# Patient Record
Sex: Female | Born: 1935 | Hispanic: No | Marital: Single | Smoking: Never smoker
Health system: Southern US, Community
[De-identification: ages and names within clinical notes are randomized; demographics above are authoritative.]

## PROBLEM LIST (undated history)

## (undated) DIAGNOSIS — I10 Essential (primary) hypertension: Secondary | ICD-10-CM

## (undated) DIAGNOSIS — E7800 Pure hypercholesterolemia, unspecified: Secondary | ICD-10-CM

## (undated) DIAGNOSIS — K449 Diaphragmatic hernia without obstruction or gangrene: Secondary | ICD-10-CM

## (undated) DIAGNOSIS — K219 Gastro-esophageal reflux disease without esophagitis: Secondary | ICD-10-CM

## (undated) DIAGNOSIS — M8580 Other specified disorders of bone density and structure, unspecified site: Secondary | ICD-10-CM

## (undated) DIAGNOSIS — C50919 Malignant neoplasm of unspecified site of unspecified female breast: Secondary | ICD-10-CM

## (undated) DIAGNOSIS — I700 Atherosclerosis of aorta: Secondary | ICD-10-CM

## (undated) DIAGNOSIS — K5790 Diverticulosis of intestine, part unspecified, without perforation or abscess without bleeding: Secondary | ICD-10-CM

## (undated) HISTORY — DX: Pure hypercholesterolemia, unspecified: E78.00

## (undated) HISTORY — DX: Gastro-esophageal reflux disease without esophagitis: K21.9

## (undated) HISTORY — DX: Atherosclerosis of aorta: I70.0

## (undated) HISTORY — DX: Malignant neoplasm of unspecified site of unspecified female breast: C50.919

## (undated) HISTORY — PX: TONSILLECTOMY: SUR1361

## (undated) HISTORY — DX: Essential (primary) hypertension: I10

## (undated) HISTORY — DX: Diverticulosis of intestine, part unspecified, without perforation or abscess without bleeding: K57.90

## (undated) HISTORY — DX: Other specified disorders of bone density and structure, unspecified site: M85.80

## (undated) HISTORY — PX: ABDOMINAL HYSTERECTOMY: SHX81

## (undated) HISTORY — PX: CATARACT EXTRACTION: SUR2

## (undated) HISTORY — DX: Diaphragmatic hernia without obstruction or gangrene: K44.9

---

## 1984-05-29 DIAGNOSIS — C50919 Malignant neoplasm of unspecified site of unspecified female breast: Secondary | ICD-10-CM

## 1984-05-29 HISTORY — DX: Malignant neoplasm of unspecified site of unspecified female breast: C50.919

## 1997-05-29 HISTORY — PX: MASTECTOMY: SHX3

## 1997-12-14 ENCOUNTER — Other Ambulatory Visit: Admission: RE | Admit: 1997-12-14 | Discharge: 1997-12-14 | Payer: Self-pay | Admitting: Cardiology

## 1998-12-20 ENCOUNTER — Other Ambulatory Visit: Admission: RE | Admit: 1998-12-20 | Discharge: 1998-12-20 | Payer: Self-pay | Admitting: Cardiology

## 1999-07-07 ENCOUNTER — Ambulatory Visit (HOSPITAL_COMMUNITY): Admission: RE | Admit: 1999-07-07 | Discharge: 1999-07-07 | Payer: Self-pay | Admitting: Obstetrics & Gynecology

## 2000-01-02 ENCOUNTER — Other Ambulatory Visit: Admission: RE | Admit: 2000-01-02 | Discharge: 2000-01-02 | Payer: Self-pay | Admitting: Cardiology

## 2001-01-04 ENCOUNTER — Other Ambulatory Visit: Admission: RE | Admit: 2001-01-04 | Discharge: 2001-01-04 | Payer: Self-pay | Admitting: Cardiology

## 2002-07-28 ENCOUNTER — Other Ambulatory Visit: Admission: RE | Admit: 2002-07-28 | Discharge: 2002-07-28 | Payer: Self-pay | Admitting: Obstetrics & Gynecology

## 2003-09-29 ENCOUNTER — Other Ambulatory Visit: Admission: RE | Admit: 2003-09-29 | Discharge: 2003-09-29 | Payer: Self-pay | Admitting: Obstetrics & Gynecology

## 2003-10-05 ENCOUNTER — Ambulatory Visit (HOSPITAL_COMMUNITY): Admission: RE | Admit: 2003-10-05 | Discharge: 2003-10-05 | Payer: Self-pay | Admitting: Obstetrics & Gynecology

## 2003-10-06 ENCOUNTER — Ambulatory Visit: Admission: RE | Admit: 2003-10-06 | Discharge: 2003-10-06 | Payer: Self-pay | Admitting: Gynecologic Oncology

## 2003-10-13 ENCOUNTER — Inpatient Hospital Stay (HOSPITAL_COMMUNITY): Admission: RE | Admit: 2003-10-13 | Discharge: 2003-10-17 | Payer: Self-pay | Admitting: Obstetrics & Gynecology

## 2003-10-13 ENCOUNTER — Encounter (INDEPENDENT_AMBULATORY_CARE_PROVIDER_SITE_OTHER): Payer: Self-pay | Admitting: Specialist

## 2006-08-08 ENCOUNTER — Ambulatory Visit: Payer: Self-pay | Admitting: Vascular Surgery

## 2009-02-26 ENCOUNTER — Encounter: Admission: RE | Admit: 2009-02-26 | Discharge: 2009-02-26 | Payer: Self-pay | Admitting: Cardiology

## 2010-01-12 ENCOUNTER — Ambulatory Visit: Payer: Self-pay | Admitting: Cardiology

## 2010-01-19 ENCOUNTER — Ambulatory Visit (HOSPITAL_COMMUNITY): Admission: RE | Admit: 2010-01-19 | Discharge: 2010-01-19 | Payer: Self-pay | Admitting: Cardiology

## 2010-01-19 ENCOUNTER — Ambulatory Visit: Payer: Self-pay | Admitting: Cardiology

## 2010-03-14 ENCOUNTER — Ambulatory Visit: Payer: Self-pay | Admitting: Cardiology

## 2010-03-15 ENCOUNTER — Ambulatory Visit: Payer: Self-pay | Admitting: Cardiology

## 2010-03-21 ENCOUNTER — Encounter: Admission: RE | Admit: 2010-03-21 | Discharge: 2010-03-21 | Payer: Self-pay | Admitting: Cardiology

## 2010-08-19 ENCOUNTER — Other Ambulatory Visit: Payer: Self-pay | Admitting: *Deleted

## 2010-08-19 DIAGNOSIS — G4700 Insomnia, unspecified: Secondary | ICD-10-CM

## 2010-08-19 NOTE — Telephone Encounter (Signed)
Received refill request via fax

## 2010-08-22 MED ORDER — ALPRAZOLAM 0.25 MG PO TABS: 0.2500 mg | ORAL_TABLET | Freq: Every evening | ORAL | Status: AC | PRN

## 2010-08-31 ENCOUNTER — Other Ambulatory Visit (INDEPENDENT_AMBULATORY_CARE_PROVIDER_SITE_OTHER): Payer: Medicare Other | Admitting: *Deleted

## 2010-08-31 DIAGNOSIS — E7800 Pure hypercholesterolemia, unspecified: Secondary | ICD-10-CM

## 2010-08-31 LAB — BASIC METABOLIC PANEL
CO2: 30 mEq/L (ref 19–32)
Chloride: 104 mEq/L (ref 96–112)
Creatinine, Ser: 0.8 mg/dL (ref 0.4–1.2)
Glucose, Bld: 102 mg/dL — ABNORMAL HIGH (ref 70–99)

## 2010-08-31 LAB — HEPATIC FUNCTION PANEL
ALT: 25 U/L (ref 0–35)
Albumin: 4.2 g/dL (ref 3.5–5.2)
Alkaline Phosphatase: 46 U/L (ref 39–117)
Bilirubin, Direct: 0.1 mg/dL (ref 0.0–0.3)
Total Protein: 6.7 g/dL (ref 6.0–8.3)

## 2010-08-31 LAB — LIPID PANEL
Total CHOL/HDL Ratio: 3
Triglycerides: 197 mg/dL — ABNORMAL HIGH (ref 0.0–149.0)

## 2010-09-01 ENCOUNTER — Encounter: Payer: Self-pay | Admitting: Cardiology

## 2010-09-01 ENCOUNTER — Ambulatory Visit (INDEPENDENT_AMBULATORY_CARE_PROVIDER_SITE_OTHER): Payer: Medicare Other | Admitting: Cardiology

## 2010-09-01 DIAGNOSIS — Z853 Personal history of malignant neoplasm of breast: Secondary | ICD-10-CM

## 2010-09-01 DIAGNOSIS — I119 Hypertensive heart disease without heart failure: Secondary | ICD-10-CM | POA: Insufficient documentation

## 2010-09-01 DIAGNOSIS — E7800 Pure hypercholesterolemia, unspecified: Secondary | ICD-10-CM

## 2010-09-01 DIAGNOSIS — M8580 Other specified disorders of bone density and structure, unspecified site: Secondary | ICD-10-CM

## 2010-09-01 DIAGNOSIS — M949 Disorder of cartilage, unspecified: Secondary | ICD-10-CM

## 2010-09-01 NOTE — Progress Notes (Signed)
History of Present Illness: This 77 year old single Caucasian female is seen for a scheduled six-month followup office visit she has a history of hypercholesterolemia and a past history of breast cancer was last seen as a work in in October 2011 because of palpitations and tachycardia.  She'll been seen in August 2011 and we did a workup for pheochromocytoma including 24-hour urine metanephrines which were normal and your thyroid function was normal.  She initially had a good clinical response to metoprolol but subsequently began to have malaise and fatigue.  She does not have any history of ischemic heart disease.  She had an echocardiogram on 08/24/is of dyspnea and tachycardia and was found to have normal left ventricular systolic function with impaired relaxation and mild aortic sclerosis.  Current Outpatient Prescriptions  Medication Sig Dispense Refill  . ALPRAZolam (XANAX) 0.25 MG tablet Take 1 tablet (0.25 mg total) by mouth at bedtime as needed for sleep.  30 tablet  1  . Ascorbic Acid (VITAMIN C) 500 MG tablet Take 500 mg by mouth daily.        Marland Kitchen aspirin 325 MG tablet Take 325 mg by mouth daily.        Marland Kitchen atorvastatin (LIPITOR) 20 MG tablet Take 20 mg by mouth daily.        . Calcium Carbonate-Vitamin D (CALTRATE 600+D PO) Take by mouth 2 (two) times daily.        . Cholecalciferol (VITAMIN D) 1000 UNITS capsule Take 1,000 Units by mouth daily.        . fish oil-omega-3 fatty acids 1000 MG capsule Take 1 g by mouth daily.        . Ibuprofen-Diphenhydramine Cit (ADVIL PM PO) Take by mouth at bedtime as needed.        . Multiple Vitamin (MULTIVITAMIN) tablet Take 1 tablet by mouth daily.        . niacin 500 MG tablet Take 500 mg by mouth 2 (two) times daily.        Marland Kitchen omeprazole (PRILOSEC) 20 MG capsule Take 20 mg by mouth daily.        . Psyllium (METAMUCIL PO) Take by mouth.        . raloxifene (EVISTA) 60 MG tablet Take 60 mg by mouth daily.        . vitamin B-12 (CYANOCOBALAMIN) 500 MCG  tablet Take 500 mcg by mouth 2 (two) times daily.        . metoprolol tartrate (LOPRESSOR) 25 MG tablet Take 25 mg by mouth daily.          No Known Allergies  Patient Active Problem List  Diagnoses  . Benign hypertensive heart disease without heart failure  . Hypercholesterolemia  . History of breast cancer  . Osteopenia    History  Smoking status  . Never Smoker   Smokeless tobacco  . Not on file    History  Alcohol Use     Family History  Problem Relation Age of Onset  . Arthritis Father   . Stroke Father   . Hypertension Mother     Review of Systems: Constitutional: no fever chills diaphoresis or fatigue or change in weight.  Head and neck: no hearing loss, no epistaxis, no photophobia or visual disturbance. Respiratory: No cough, shortness of breath or wheezing. Cardiovascular: No chest pain peripheral edema, palpitations. Gastrointestinal: No abdominal distention, no abdominal pain, no change in bowel habits hematochezia or melena. Genitourinary: No dysuria, no frequency, no urgency, no nocturia. Musculoskeletal:No arthralgias,  no back pain, no gait disturbance or myalgias. Neurological: No dizziness, no headaches, no numbness, no seizures, no syncope, no weakness, no tremors. Hematologic: No lymphadenopathy, no easy bruising. Psychiatric: No confusion, no hallucinations, no sleep disturbance.    Physical Exam: Filed Vitals:   09/01/10 1042  BP: 138/80  Pulse: 84  Weight 104, up 4 pounds.  This is a well-developed thin woman in no acute distress.Pupils equal and reactive.   Extraocular Movements are full.  There is no scleral icterus.  The mouth and pharynx are normal.  The neck is supple.  The carotids reveal no bruits.  The jugular venous pressure is normal.  The thyroid is not enlarged.  There is no lymphadenopathy.The chest is clear to percussion and auscultation. There are no rales or rhonchi. Expansion of the chest is symmetrical.The precordium is quiet.   The first heart sound is normal.  The second heart sound is physiologically split.  There is no murmur gallop rub or click.  There is no abnormal lift or heave.The abdomen is soft and nontender. Bowel sounds are normal. The liver and spleen are not enlarged. There Are no abdominal masses. There are no bruits.The pedal pulses are good.  There is no phlebitis or edema.  There is no cyanosis or clubbing.Strength is normal and symmetrical in all extremities.  There is no lateralizing weakness.  There are no sensory deficits.   Assessment / Plan:

## 2010-09-01 NOTE — Assessment & Plan Note (Signed)
When we last saw the patient her blood pressure and pulse were running high and we added generic Toprol one daily.  She did not tolerate the Toprol.  Made her feel weak.  She tapered off the Toprol and kept track of her blood pressures at home which have been any 120/70 range.  She has not been expressing any chest pain or shortness of breath.  He also developed a bloating from the beta blocker.

## 2010-09-01 NOTE — Assessment & Plan Note (Signed)
The patient has a history of hypercholesterolemia.  She presently is onLipitor 20 mg daily.  He's not having any myalgias from the Lipitor.

## 2010-10-14 NOTE — H&P (Signed)
NAME:  Leslie [name], Leslie [name]                            ACCOUNT NO.:  [id number]   MEDICAL RECORD NO.:  [id number]                   PATIENT TYPE:  INP   LOCATION:  NA                                   [facility]ACILITY[facility]   PHYSICIAN:  Freddy Finner, M.D.                DATE OF BIRTH:  Jul 01, 1928   DATE OF ADMISSION:  10/13/2003  DATE OF DISCHARGE:                                HISTORY & PHYSICAL   ADMITTING DIAGNOSIS:  Probable endometrial carcinoma with marked enlargement  and [name] cystic and solid endometrial mass.   HISTORY OF PRESENT ILLNESS:  The patient is [name] 77 year old white single  female who presented for routine annual GYN examination on Sep 29, 2003.  She  was completely asymptomatic at that time with no complaints at all except  for some mild vaginal and rectal itching.  On pelvic examination, she was  found to have [name] large cystic pelvic mass.  Pelvic ultrasound was obtained  that day in the office which showed [name] very large midline pelvic mass which  was 12.1 x 10.0 cm and complex, hypervascular appearance; no other pelvic  abnormalities could be identified.  Subsequent to this examination, she had  [name] CT scan of the abdomen and pelvis on Oct 05, 2003 which again showed [name]  large pelvic mass which was felt to be the uterus with thinning of the  myometrium and marked enlargement of the endometrial canal which measured  10.3 cm in length on this examination.  All other organ systems within the  abdomen were normal except for [name] small sliding hiatal hernia.  There was no  appreciable adenopathy, no peritoneal fluid.  Subsequent to that  examination, she was seen by Dr. Jonny Ruiz T. [name] in consultation and is now  scheduled for exploratory laparotomy, total abdominal hysterectomy,  bilateral salpingo-oophorectomy, possible further staging for malignancy,  based on findings at surgery.   REVIEW OF SYSTEMS:  Her current review of systems is basically negative with  no complaints.   PAST MEDICAL  HISTORY:  1. She has hyperlipidemia for which she takes Lipitor 20 mg [name] day.  2. She has osteopenia for which she takes Evista 60 mg [name] day.  3. She also takes Niacin for her cholesterol.  4. She takes [name] calcium supplement.  5. She did have breast cancer in 1986, treated with lumpectomy.  She had [name]     recurrence in 1989 and was treated with mastectomy.  She subsequently had     [name] course of tamoxifen for [name] short period of time.  To date, there has     been no evidence of recurrence of the breast cancer, now approximately 16     years from her mastectomy.  6. She is not known to have to have any other medical illnesses.   PHYSICAL EXAMINATION:  VITAL SIGNS:  Blood pressure in the office was  122/70.  HEENT:  HEENT is grossly within normal limits.  NECK:  Thyroid gland is not palpably enlarged.  CHEST:  Chest is clear to auscultation.  HEART:  Normal sinus rhythm without murmurs, rubs, or gallops.  BREAST EXAM:  Right breast is considered to be normal, no palpable masses,  no skin change, no evidence of nipple discharge.  The right chest wall is  palpably normal.  There is no adenopathy on the left side.  ABDOMEN:  Abdomen is soft and nontender.  There is [name] mass in the pelvis  arising to the level of approximately 2 cm below the umbilicus.  PELVIC EXAMINATION:  External genitalia, vagina and cervix are normal.  Bimanual reveals [name] mass, as noted above, approximately 18 weeks' gestational  size, and on rectovaginal exam, it seems to be filling the pelvis.   ACCESSORY CLINICAL DATA:  CA125 in the office was normal.   ASSESSMENT:  Marked enlargement of the uterus with cystic and solid  components of the endometrium consistent with endometrial carcinoma.   PLAN:  Exploratory laparotomy, total abdominal hysterectomy, bilateral  salpingo-oophorectomy, further staging as indicated by the intraoperative  diagnosis.                                               Freddy Finner, M.D.     WRN/MEDQ  D:  10/12/2003  T:  10/13/2003  Job:  [id number]

## 2010-10-14 NOTE — Op Note (Signed)
NAME:  [name], [name]                            ACCOUNT NO.:  [id number]   MEDICAL RECORD NO.:  [id number]                   PATIENT TYPE:  INP   LOCATION:  0457                                 FACILITY:  [name]   PHYSICIAN:  De Blanch, M.D.         DATE OF BIRTH:  1928/09/03   DATE OF PROCEDURE:  10/13/2003  DATE OF DISCHARGE:                                 OPERATIVE REPORT   PREOPERATIVE DIAGNOSES:  Enlarging uterine mass in [name] menopausal patient.   POSTOPERATIVE DIAGNOSES:  Uterine fibroid (pending final pathology).   PROCEDURE:  Exploratory laparotomy, total abdominal hysterectomy, bilateral  salpingo-oophorectomy.   SURGEON:  De Blanch, M.D.   ASSISTANT:  1. W. Varney Baas, M.D.  2. Telford Nab, R.N.   ANESTHESIA:  General with oral tracheal tube.   ESTIMATED BLOOD LOSS:  100 mL.   FINDINGS:  At the time of exploratory laparotomy, the upper abdomen  including the diaphragm, liver, spleen, stomach, omentum, small and large  bowel were normal. There were no enlarged pelvic or periaortic lymph nodes.  The uterus is enlarged to approximately 14 week size with softening.  No  focal lesions were noted on the serosa.  On frozen section, we were told the  patient had what appeared to be [name] benign uterine fibroid. There was also an  endocervical polyp. The tubes and ovaries were atrophic and small.   DESCRIPTION OF PROCEDURE:  The patient was brought to the operating room and  after satisfactory attainment of general anesthesia was placed in the  modified lithotomy position in [place] stirrups.  The anterior abdominal wall,  perineum and vagina were prepped with Betadine, [name] Foley catheter was  inserted and the patient was draped. The abdomen was entered through [name] low  midline incision. Peritoneal washings were obtained from the pelvis. The  upper abdomen and pelvis were explored with the above noted findings.  The  Bookwalter retractor was positioned and  the bowel was packed out of the  pelvis.  The right retroperitoneal space was opened identifying the vessels  and ureters. The ovarian vessels were skeletonized, clamped, cut, free tied  and suture ligated. The peritoneum of the posterior broad ligament was  incised toward the uterus. [name] similar procedure was carried out on the left  side of the pelvis in order to control the ovarian vessels. The bladder flap  was then advanced with sharp and blunt dissection. The uterine vessels were  skeletonized, clamp, cut and suture ligated.  In [name] stepwise fashion, the  paracervical and cardinal ligaments were clamped, cut and suture ligated.  The vaginal angles were identified, cross clamped and the vagina transected  from its connection to the cervix. The cervix, uterus, tubes and ovaries  were sent to pathology for frozen section with [name] report returning as noted  above. The vaginal angles were transfixed with #0 Vicryl and the central  portion of the vagina closed with interrupted  figure-of-eight sutures of #0  Vicryl.  The pelvis was irrigated, hemostasis was ascertained. Given the  benign report from frozen section, no further surgery was felt to be  appropriate.  Therefore the packs and retractors were removed and the  anterior abdominal wall closed in layers. The first layer was [name] running mass  closure using #1 PDS. The subcutaneous tissue was irrigated, hemostasis  achieved  with cautery and the skin closed with skin staples.  [name] dressing was applied.  The patient was awakened from anesthesia and taken to the recovery room in  satisfactory condition. Sponge, needle and instrument counts were correct  x2.                                               De Blanch, M.D.    DC/MEDQ  D:  10/13/2003  T:  10/13/2003  Job:  [id number]   cc:   Freddy Finner, M.D.  [address] [place]  [place]  [place] [address]  Fax: [phone]   Telford Nab, R.N.  [address]  [place],  [place] [address]

## 2010-10-14 NOTE — Consult Note (Signed)
NAME:  [name], [name]                            ACCOUNT NO.:  [id number]   MEDICAL RECORD NO.:  [id number]                   PATIENT TYPE:  OUT   LOCATION:  GYN                                  FACILITY:  [facility]   PHYSICIAN:  John T. Kyla Balzarine, M.D.                 DATE OF BIRTH:  1929-02-16   DATE OF CONSULTATION:  10/06/2003  DATE OF DISCHARGE:                                   CONSULTATION   CHIEF COMPLAINT:  Pelvic mass.   HISTORY OF PRESENT ILLNESS:  This patient was evaluated for routine  examination and found to have [name] questionable abdominopelvic cyst or mass.  Ultrasound revealed [name] hypervascular cyst involving the ovary of the  uterus, and [name] CT scan of the abdomen and pelvis was performed on Oct 05, 2003.  The results are available for review, and I reviewed the images.  This revealed no evidence of metastatic disease or ascites, and no evidence  of retroperitoneal lymphadenopathy.  [name] complex mass replaces the uterine  fundus within myometrium, and marked enlargement of the endometrial canal  measuring 10 cm in width, inhomogeneous, and soft tissue and fluid.  Fat  planes in the pelvis appear maintained, and the cervix was normal.  Upper  abdominal contents and lung bases are not clear.  The patient in retrospect  does note some increasing urinary frequency, but denies change in bowel or  bladder function, vaginal discharge, or menopausal bleeding.   PAST MEDICAL HISTORY:  Significant for breast cancer treated with lumpectomy  and radiation therapy in 1986, followed by mastectomy in 1989 for recurrent  disease.  She gives [name] history of brief treatment with Tamoxifen in the  remote past for hypercholesterolemia.  She has been on Evista for the past  4 years, with [name] diagnosis of osteopenia.   PAST SURGICAL HISTORY:  1. Remote tonsillectomy.  2. Breast surgery.   MEDICATIONS:  1. Evista.  2. Lipitor.   ALLERGIES:  None known.   PERSONAL SOCIAL HISTORY:  The patient is single,  and denies tobacco or  ethanol.   FAMILY HISTORY:  No breast, gynecologic, or colon cancer known.   REVIEW OF SYSTEMS:  Otherwise unremarkable.  The patient is [name] nulligravida,  and denies prior history with GYN problems.   PHYSICAL EXAMINATION:  VITAL SIGNS:  Weight 101 pounds, blood pressure  138/78, pulse 92, respirations 18.  GENERAL:  The patient is alert and oriented x3, in no acute distress.  HEENT:  Benign with clear oropharynx.  NECK:  Supple without goiter.  LUNGS:  Lung fields are clear.  HEART:  Heart sounds are regular rate and rhythm without murmur or gallop.  BACK:  There is no back or CVA tenderness.  ABDOMEN:  Scaphoid, soft, and benign, without ascites, mass, or  organomegaly.  EXTREMITIES:  Full range of motion without edema.  NEUROLOGIC:  The neurologic screen is intact.  EXTERNAL GENITALIA/BUS:  Normal to inspection and palpation with atrophic  introitus.  The vagina is well supported without mucosal lesions, and with  atrophic mucosa.  The cervix is nulliparous, and will not admit [name] Pipelle  endometrial biopsy instrument, despite using [name] tenaculum.  No gross lesions  present.  Mobile.  Uterus:  The uterus is enlarged to approximately 14 weeks  size, mobile, and not irregular.  Adnexa and parametria with no adnexal mass  or nodularity, confirmed on rectovaginal exam.   ASSESSMENT:  Complete pelvic mass, likely hematometra or endometrial cancer.   PLAN:  I had [name] long discussion with the patient, and recommended exploratory  laparotomy with TAH/BSO.  The uterus would be sent for frozen section, and  if this were, indeed, endometrial cancer or an ovarian cancer, appropriate  surgical staging would be performed.  I details the risks and benefits of  the procedure, and the patient is in agreement with this plan.  The surgery  is tentatively scheduled in the near future with Dr. Marcelle Overlie.  Dr. Kemper Durie-  Sharol Given will be performing the surgery for our service and the  patient  wishes to meet him prior to surgery.  I answered multiple questions, and the  patient appeared satisfied with the answers.                                               John T. Kyla Balzarine, M.D.    JTS/MEDQ  D:  10/06/2003  T:  10/06/2003  Job:  [id number]   cc:   Freddy Finner, M.D.  [address] [place]  [place]  [place] [address]  Fax: [phone]   Telford Nab, R.N.  [address]  [place], [place] [address]   Cassell Clement, M.D.  [address]  [place]  [place] [address]  Fax: [phone]

## 2010-10-14 NOTE — Discharge Summary (Signed)
NAME:  [name], [name]                            ACCOUNT NO.:  [id number]   MEDICAL RECORD NO.:  [id number]                   PATIENT TYPE:  INP   LOCATION:  0450                                 FACILITY:  [facility]   PHYSICIAN:  Freddy Finner, M.D.                DATE OF BIRTH:  09/14/1928   DATE OF ADMISSION:  10/13/2003  DATE OF DISCHARGE:  10/17/2003                                 DISCHARGE SUMMARY   DISCHARGE DIAGNOSES:  1. Large submucous leiomyoma with degeneration.  2. Benign adenofibromas of the ovaries.   SECONDARY DIAGNOSES:  1. Breast cancer with initial diagnosis in 1986.  Recurrence in 1989.     Subsequent mucosectomy and subsequent recurrence.  2. Hyperlipidemia for which she takes Lipitor and niacin.  3. Osteopenia, for which she takes Evista.   DISPOSITION:  Patient is in satisfactory, improved condition at the time of  her discharge.  She is having bowel and bladder function.  She is ambulatory  without difficulty.  She is tolerating her diet.  She is discharged home for  followup for __________ early next week and regular followups in two- and  six-week intervals.  She is to have progressive increase in her physical  activity.  No heavy lifting.  No vaginal entry.  She is to call for fever,  for severe pain, and for heavy bleeding.  She is to take a look at her diet.  She is to resume all of her medications.  She is given Percocet 5/325 to be  taken as needed for postoperative pain.   Details of the present illness, past history, family history, review of  systems, and physical exam are recorded in the admission note.  Her physical  findings on admission were primarily remarkable for a large pelvic mass  which filled the pelvis and extended to approximately 2 cm below the  umbilicus.   Preoperative laboratory evaluation included a CT scan of the pelvis which  showed a large mass in the uterine cavity but no abnormalities on CT scan.  Pelvic ultrasound showed a  cystic and solid mass associated with the  endometrium and visible abnormality of the ovaries.   Other laboratory data included an admission CBC with hemoglobin of 14.3, WBC  11.4, platelet count 263.  CMET on admission was normal.  Postoperative  laboratory data included a CMET which was essentially normal with slight  elevation of glucose,  but patient was on IV fluids.  Postoperative  hemoglobin was 12 on the first postoperative day.   Patient was admitted on the morning of surgery.  She was treated  perioperatively with PAS compression hose and IV antibiotics.  She had a  total abdominal hysterectomy and bilateral salpingo-oophorectomy with  intraoperative frozen section of endometrial mass, showing no evidence of a  malignancy.   Postoperatively, her course was without complication.  She remained afebrile  throughout her hospital  stay.  She did have some slightly slow return of  bowel function.  By the morning of the fourth postoperative day, she was  ambulating without difficulty.  She was having adequate bowel and bladder  functions.  Her disposition is further noted above.                                               Freddy Finner, M.D.    WRN/MEDQ  D:  10/17/2003  T:  10/17/2003  Job:  [id number]   cc:   Cassell Clement, M.D.  [address]  [place]  [place] [address]  Fax: [phone]   De Blanch, M.D.

## 2010-11-29 ENCOUNTER — Other Ambulatory Visit: Payer: Self-pay | Admitting: Cardiology

## 2010-11-29 MED ORDER — ALPRAZOLAM 0.25 MG PO TABS: 0.2500 mg | ORAL_TABLET | Freq: Every evening | ORAL | Status: DC | PRN

## 2010-11-29 NOTE — Telephone Encounter (Signed)
Med refill

## 2011-01-16 ENCOUNTER — Other Ambulatory Visit: Payer: Self-pay | Admitting: *Deleted

## 2011-01-16 DIAGNOSIS — F419 Anxiety disorder, unspecified: Secondary | ICD-10-CM

## 2011-01-16 MED ORDER — ALPRAZOLAM 0.25 MG PO TABS: 0.2500 mg | ORAL_TABLET | Freq: Every evening | ORAL | Status: AC | PRN

## 2011-01-16 NOTE — Telephone Encounter (Signed)
Refilled meds per fax request. Faxed signed Rx back 

## 2011-03-04 ENCOUNTER — Other Ambulatory Visit: Payer: Self-pay | Admitting: Cardiology

## 2011-03-07 ENCOUNTER — Other Ambulatory Visit: Payer: Self-pay | Admitting: *Deleted

## 2011-03-30 ENCOUNTER — Other Ambulatory Visit: Payer: Self-pay | Admitting: Cardiology

## 2011-04-04 ENCOUNTER — Other Ambulatory Visit: Payer: Self-pay | Admitting: *Deleted

## 2011-04-04 ENCOUNTER — Other Ambulatory Visit (INDEPENDENT_AMBULATORY_CARE_PROVIDER_SITE_OTHER): Payer: Medicare Other | Admitting: *Deleted

## 2011-04-04 DIAGNOSIS — E7800 Pure hypercholesterolemia, unspecified: Secondary | ICD-10-CM

## 2011-04-04 LAB — CBC WITH DIFFERENTIAL/PLATELET
Eosinophils Relative: 3.7 % (ref 0.0–5.0)
HCT: 43.2 % (ref 36.0–46.0)
Hemoglobin: 14.3 g/dL (ref 12.0–15.0)
Lymphs Abs: 2.5 10*3/uL (ref 0.7–4.0)
Monocytes Relative: 6.3 % (ref 3.0–12.0)
Neutro Abs: 5 10*3/uL (ref 1.4–7.7)
RBC: 4.68 Mil/uL (ref 3.87–5.11)
WBC: 8.4 10*3/uL (ref 4.5–10.5)

## 2011-04-04 LAB — BASIC METABOLIC PANEL
GFR: 67.43 mL/min (ref >60.00)
Potassium: 4.1 mEq/L (ref 3.5–5.1)
Sodium: 142 mEq/L (ref 135–145)

## 2011-04-04 LAB — HEPATIC FUNCTION PANEL
ALT: 25 U/L (ref 0–35)
Total Protein: 7.3 g/dL (ref 6.0–8.3)

## 2011-04-04 LAB — LIPID PANEL
Cholesterol: 174 mg/dL (ref 0–200)
HDL: 71 mg/dL (ref >39.00)
VLDL: 35.2 mg/dL (ref 0.0–40.0)

## 2011-04-05 ENCOUNTER — Encounter: Payer: Self-pay | Admitting: Cardiology

## 2011-04-05 ENCOUNTER — Ambulatory Visit (INDEPENDENT_AMBULATORY_CARE_PROVIDER_SITE_OTHER): Payer: Medicare Other | Admitting: Cardiology

## 2011-04-05 VITALS — BP 128/80 | HR 80 | Ht 59.0 in | Wt 101.0 lb

## 2011-04-05 DIAGNOSIS — M949 Disorder of cartilage, unspecified: Secondary | ICD-10-CM

## 2011-04-05 DIAGNOSIS — M8580 Other specified disorders of bone density and structure, unspecified site: Secondary | ICD-10-CM

## 2011-04-05 DIAGNOSIS — I119 Hypertensive heart disease without heart failure: Secondary | ICD-10-CM

## 2011-04-05 DIAGNOSIS — E7800 Pure hypercholesterolemia, unspecified: Secondary | ICD-10-CM

## 2011-04-05 DIAGNOSIS — K449 Diaphragmatic hernia without obstruction or gangrene: Secondary | ICD-10-CM | POA: Insufficient documentation

## 2011-04-05 DIAGNOSIS — Z853 Personal history of malignant neoplasm of breast: Secondary | ICD-10-CM

## 2011-04-05 NOTE — Progress Notes (Signed)
[name] Date of Birth:  04/06/1929 [facility] / [facility] [address]   [address] [place], [place]  [address] [phone]           Fax   [phone]  History of Present Illness: This pleasant 77 year old woman is seen for an extended office visit.  She has a past history of essential hypertension and hypercholesterolemia.  She also has a past history of breast cancer of the left breast with no evidence of recurrence.  She has a history of osteopenia.  Current Outpatient Prescriptions  Medication Sig Dispense Refill  . ALPRAZolam (XANAX) 0.5 MG tablet Take 0.5 mg by mouth as directed. 1/2 tablet daily as needed       . Ascorbic Acid (VITAMIN C) 500 MG tablet Take 500 mg by mouth daily.        Marland Kitchen aspirin 325 MG tablet Take 325 mg by mouth daily.        Marland Kitchen atorvastatin (LIPITOR) 20 MG tablet TAKE ONE (1) TABLET(S) ONCE DAILY  90 tablet  3  . Calcium Carbonate-Vitamin D (CALTRATE 600+D PO) Take by mouth 2 (two) times daily.        . Cholecalciferol (VITAMIN D) 1000 UNITS capsule Take 1,000 Units by mouth daily.        Marland Kitchen EVISTA 60 MG tablet TAKE ONE (1) TABLET(S) ONCE DAILY  30 tablet  6  . fish oil-omega-3 fatty acids 1000 MG capsule Take 1 g by mouth daily.        . Ibuprofen-Diphenhydramine Cit (ADVIL PM PO) Take by mouth at bedtime as needed.        . Multiple Vitamin (MULTIVITAMIN) tablet Take 1 tablet by mouth daily.        . niacin 500 MG tablet Take 500 mg by mouth 2 (two) times daily.        Marland Kitchen omeprazole (PRILOSEC) 20 MG capsule Take 20 mg by mouth daily.        . Psyllium (METAMUCIL PO) Take by mouth.        . vitamin B-12 (CYANOCOBALAMIN) 500 MCG tablet Take 500 mcg by mouth 2 (two) times daily.          No Known Allergies  Patient Active Problem List  Diagnoses  . Benign hypertensive heart disease without heart failure  . Hypercholesterolemia  . History of breast cancer  . Osteopenia    History  Smoking status  . Never Smoker   Smokeless  tobacco  . Not on file    History  Alcohol Use     Family History  Problem Relation Age of Onset  . Arthritis Father   . Stroke Father   . Hypertension Mother     Review of Systems: Constitutional: no fever chills diaphoresis or fatigue or change in weight.  Head and neck: no hearing loss, no epistaxis, no photophobia or visual disturbance. Respiratory: No cough, shortness of breath or wheezing. Cardiovascular: No chest pain peripheral edema, palpitations. Gastrointestinal: No abdominal distention, no abdominal pain, no change in bowel habits hematochezia or melena. Genitourinary: No dysuria, no frequency, no urgency, no nocturia. Musculoskeletal:No arthralgias, no back pain, no gait disturbance or myalgias. Neurological: No dizziness, no headaches, no numbness, no seizures, no syncope, no weakness, no tremors. Hematologic: No lymphadenopathy, no easy bruising. Psychiatric: No confusion, no hallucinations, no sleep disturbance.    Physical Exam: Filed Vitals:   04/05/11 1519  BP: 128/80  Pulse: 80   Gen. appearance reveals a thin elderly  woman in no distress.Pupils equal and reactive.   Extraocular Movements are full.  There is no scleral icterus.  The mouth and pharynx are normal.  The neck is supple.  The carotids reveal no bruits.  The jugular venous pressure is normal.  The thyroid is not enlarged.  There is no lymphadenopathy.  The chest is clear to percussion and auscultation. There are no rales or rhonchi. Expansion of the chest is symmetrical.  Left breast absent right breast reveals no masses.The precordium is quiet.  The first heart sound is normal.  The second heart sound is physiologically split.  There is no murmur gallop rub or click.  There is no abnormal lift or heave.  The abdomen is soft and nontender. Bowel sounds are normal. The liver and spleen are not enlarged. There Are no abdominal masses. There are no bruits.  The pedal pulses are good.  There is no  phlebitis or edema.  There is no cyanosis or clubbing. The skin is warm and dry.  There is no rash. Strength is normal and symmetrical in all extremities.  There is no lateralizing weakness.  There are no sensory deficits.  EKG shows normal sinus rhythm and is within normal limits.  Assessment / Plan: The patient is to continue same medication and recheck in 6 months for followup office visit and lab work

## 2011-04-05 NOTE — Assessment & Plan Note (Signed)
The patient has a history of hypercholesterolemia.  She is presently on enteric Lipitor 20 mg daily.  She has not had any myalgias or side effects.  Her lipids were reviewed on our excellent and she will continue same meds

## 2011-04-05 NOTE — Patient Instructions (Signed)
Your physician recommends that you continue on your current medications as directed. Please refer to the Current Medication list given to you today. Your physician wants you to follow-up in: 6months with fasting labsYou will receive a reminder letter in the mail two months in advance. If you don't receive a letter, please call our office to schedule the follow-up appointment. Will get chest xray and call you with results

## 2011-04-05 NOTE — Assessment & Plan Note (Signed)
Patient has a past history of breast cancer left breast.  She had a left mastectomy in 1999.  There has been no evidence of recurrence clinically.  We are checking a chest x-ray today after this visit

## 2011-04-05 NOTE — Assessment & Plan Note (Signed)
The patient is not expressing any chest pain or shortness of breath.  She stays physically active.  She not do any intentional aerobic activity but does state that he stays busy.  She is not expressing any dizziness or syncope

## 2011-04-05 NOTE — Assessment & Plan Note (Signed)
Patient has a history of a hiatal hernia.  She is on long-term omeprazole 20 mg daily.  She gets it free from [employer] where she used to work.  As long she takes the omeprazole she is not having any symptoms from her hiatal hernia

## 2011-04-05 NOTE — Assessment & Plan Note (Signed)
The patient has a history of osteopenia.  She is on Evista 60 mg daily.

## 2011-04-11 ENCOUNTER — Ambulatory Visit
Admission: RE | Admit: 2011-04-11 | Discharge: 2011-04-11 | Disposition: A | Discharge status: Home / Self Care | Payer: Medicare Other | Source: Ambulatory Visit | Attending: Cardiology | Admitting: Cardiology

## 2011-04-11 DIAGNOSIS — I119 Hypertensive heart disease without heart failure: Secondary | ICD-10-CM

## 2011-04-11 DIAGNOSIS — E7800 Pure hypercholesterolemia, unspecified: Secondary | ICD-10-CM

## 2011-04-12 ENCOUNTER — Telehealth: Payer: Self-pay | Admitting: Cardiology

## 2011-04-12 ENCOUNTER — Telehealth: Payer: Self-pay | Admitting: *Deleted

## 2011-04-12 NOTE — Telephone Encounter (Signed)
Message copied by Burnell Blanks on Wed Apr 12, 2011  6:01 PM ------      Message from: Cassell Clement      Created: Tue Apr 11, 2011  5:59 PM       Xray normal.  Pl report.

## 2011-04-12 NOTE — Telephone Encounter (Signed)
Advised of chest xray results 

## 2011-04-12 NOTE — Telephone Encounter (Signed)
Advised of chest xray results

## 2011-04-12 NOTE — Telephone Encounter (Signed)
Pt returning call to Melinda. Please call back.  

## 2011-04-12 NOTE — Progress Notes (Signed)
Left message

## 2011-08-30 ENCOUNTER — Other Ambulatory Visit: Payer: Self-pay | Admitting: *Deleted

## 2011-08-30 DIAGNOSIS — F419 Anxiety disorder, unspecified: Secondary | ICD-10-CM

## 2011-08-31 MED ORDER — ALPRAZOLAM 0.5 MG PO TABS: 0.5000 mg | ORAL_TABLET | ORAL | Status: DC

## 2011-10-04 ENCOUNTER — Other Ambulatory Visit (INDEPENDENT_AMBULATORY_CARE_PROVIDER_SITE_OTHER): Payer: Medicare Other

## 2011-10-04 DIAGNOSIS — E7800 Pure hypercholesterolemia, unspecified: Secondary | ICD-10-CM | POA: Diagnosis not present

## 2011-10-04 LAB — HEPATIC FUNCTION PANEL
ALT: 21 U/L (ref 0–35)
AST: 20 U/L (ref 0–37)
Alkaline Phosphatase: 44 U/L (ref 39–117)
Bilirubin, Direct: 0 mg/dL (ref 0.0–0.3)
Total Bilirubin: 0.5 mg/dL (ref 0.3–1.2)
Total Protein: 6.9 g/dL (ref 6.0–8.3)

## 2011-10-04 LAB — BASIC METABOLIC PANEL
CO2: 26 mEq/L (ref 19–32)
Chloride: 106 mEq/L (ref 96–112)
Potassium: 4.2 mEq/L (ref 3.5–5.1)
Sodium: 140 mEq/L (ref 135–145)

## 2011-10-04 LAB — LIPID PANEL
LDL Cholesterol: 66 mg/dL (ref 0–99)
Total CHOL/HDL Ratio: 2
Triglycerides: 155 mg/dL — ABNORMAL HIGH (ref 0.0–149.0)

## 2011-10-04 NOTE — Progress Notes (Signed)
Quick Note:  Please make copy of labs for patient visit. ______ 

## 2011-10-05 ENCOUNTER — Ambulatory Visit (INDEPENDENT_AMBULATORY_CARE_PROVIDER_SITE_OTHER): Payer: Medicare Other | Admitting: Cardiology

## 2011-10-05 ENCOUNTER — Other Ambulatory Visit: Payer: Medicare Other

## 2011-10-05 ENCOUNTER — Encounter: Payer: Self-pay | Admitting: Cardiology

## 2011-10-05 VITALS — BP 175/92 | HR 105 | Ht 59.0 in | Wt 101.0 lb

## 2011-10-05 DIAGNOSIS — I119 Hypertensive heart disease without heart failure: Secondary | ICD-10-CM | POA: Diagnosis not present

## 2011-10-05 DIAGNOSIS — R0989 Other specified symptoms and signs involving the circulatory and respiratory systems: Secondary | ICD-10-CM

## 2011-10-05 DIAGNOSIS — E7800 Pure hypercholesterolemia, unspecified: Secondary | ICD-10-CM

## 2011-10-05 MED ORDER — METOPROLOL SUCCINATE ER 25 MG PO TB24: 25.0000 mg | ORAL_TABLET | Freq: Every day | ORAL | Status: DC

## 2011-10-05 NOTE — Assessment & Plan Note (Signed)
Patient has a history of hypercholesterolemia.  We reviewed her labs which are excellent

## 2011-10-05 NOTE — Progress Notes (Signed)
[name] Date of Birth:  Oct 09, 1928 [facility] [address] [place], [place]  [address] [phone]         Fax   [phone]  History of Present Illness: This pleasant 78 year old woman is seen for a scheduled followup office visit.  She has a history of high blood pressure and hypercholesterolemia.  She has a past history of breast cancer left breast with no evidence of recurrence.  She has a history of osteopenia.  Recently she has noted an increased and her blood pressure.  She is not presently on any blood pressure medicines.  Current Outpatient Prescriptions  Medication Sig Dispense Refill  . ALPRAZolam (XANAX) 0.5 MG tablet Take 1 tablet (0.5 mg total) by mouth as directed. 1/2 tablet daily as needed  30 tablet  3  . Ascorbic Acid (VITAMIN C) 500 MG tablet Take 500 mg by mouth daily.        Marland Kitchen aspirin 325 MG tablet Take 325 mg by mouth daily.        Marland Kitchen atorvastatin (LIPITOR) 20 MG tablet TAKE ONE (1) TABLET(S) ONCE DAILY  90 tablet  3  . Calcium Carbonate-Vitamin D (CALTRATE 600+D PO) Take by mouth 2 (two) times daily.        . Cholecalciferol (VITAMIN D) 1000 UNITS capsule Take 1,000 Units by mouth daily.        Marland Kitchen EVISTA 60 MG tablet TAKE ONE (1) TABLET(S) ONCE DAILY  30 tablet  6  . fish oil-omega-3 fatty acids 1000 MG capsule Take 1 g by mouth daily.        . Ibuprofen-Diphenhydramine Cit (ADVIL PM PO) Take by mouth at bedtime as needed.        . Multiple Vitamin (MULTIVITAMIN) tablet Take 1 tablet by mouth daily.        . niacin 500 MG tablet Take 500 mg by mouth 2 (two) times daily.        Marland Kitchen omeprazole (PRILOSEC) 20 MG capsule Take 20 mg by mouth daily.        . Psyllium (METAMUCIL PO) Take by mouth.        . vitamin B-12 (CYANOCOBALAMIN) 500 MCG tablet Take 500 mcg by mouth 2 (two) times daily.        . metoprolol succinate (TOPROL XL) 25 MG 24 hr tablet Take 1 tablet (25 mg total) by mouth daily.  90 tablet  3    No Known Allergies  Patient  Active Problem List  Diagnoses  . Benign hypertensive heart disease without heart failure  . Hypercholesterolemia  . History of breast cancer  . Osteopenia  . Hiatal hernia    History  Smoking status  . Never Smoker   Smokeless tobacco  . Not on file    History  Alcohol Use     Family History  Problem Relation Age of Onset  . Arthritis Father   . Stroke Father   . Hypertension Mother     Review of Systems: Constitutional: no fever chills diaphoresis or fatigue or change in weight.  Head and neck: no hearing loss, no epistaxis, no photophobia or visual disturbance. Respiratory: No cough, shortness of breath or wheezing. Cardiovascular: No chest pain peripheral edema, palpitations. Gastrointestinal: No abdominal distention, no abdominal pain, no change in bowel habits hematochezia or melena. Genitourinary: No dysuria, no frequency, no urgency, no nocturia. Musculoskeletal:No arthralgias, no back pain, no gait disturbance or myalgias. Neurological: No dizziness, no headaches, no numbness, no seizures, no  syncope, no weakness, no tremors. Hematologic: No lymphadenopathy, no easy bruising. Psychiatric: No confusion, no hallucinations, no sleep disturbance.    Physical Exam: Filed Vitals:   10/05/11 0858  BP: 175/92  Pulse: 105   the general appearance reveals a thin woman in no acute distress.Pupils equal and reactive.   Extraocular Movements are full.  There is no scleral icterus.  The mouth and pharynx are normal.  The neck is supple.  The carotids reveal a right carotid bruit.  The jugular venous pressure is normal.  The thyroid is not enlarged.  There is no lymphadenopathy. The chest is clear to percussion and auscultation. There are no rales or rhonchi. Expansion of the chest is symmetrical.  The precordium is quiet.  The first heart sound is normal.  The second heart sound is physiologically split.  There is no murmur gallop rub or click.  There is no abnormal lift  or heave.   The abdomen is soft and nontender. Bowel sounds are normal. The liver and spleen are not enlarged. There Are no abdominal masses. There are no bruits. The pedal pulses are good.  There is no phlebitis or edema.  There is no cyanosis or clubbing. Strength is normal and symmetrical in all extremities.  There is no lateralizing weakness.  There are no sensory deficits.  The skin is warm and dry.  There is no rash.       Assessment / Plan:  For her systolic blood pressure and her resting tachycardia we will add Toprol-XL 25 one daily.  She will keep track of her blood pressures at home and call our nurse in one to 2 weeks for a followup report.  If her blood pressure is satisfactory we will plan to keep a six-month followup office visit and fasting lab work, otherwise see her sooner.

## 2011-10-05 NOTE — Assessment & Plan Note (Signed)
On exam today she was noted to have a right carotid bruit.  She denies any symptoms of TIAs.  She thinks that we may have done a carotid Doppler 4 or 5 years ago but we are unable to find any records of that test.  We'll arrange for carotid Dopplers.

## 2011-10-05 NOTE — Assessment & Plan Note (Signed)
The blood pressure has been running high.  She has been under more stress.  He has been taking a few Lodine for a root canal procedure.

## 2011-10-05 NOTE — Patient Instructions (Signed)
Your physician has requested that you have a carotid duplex. This test is an ultrasound of the carotid arteries in your neck. It looks at blood flow through these arteries that supply the brain with blood. Allow one hour for this exam. There are no restrictions or special instructions.   Start Toprol 25 mg daily    Your physician wants you to follow-up in: 6 months You will receive a reminder letter in the mail two months in advance. If you don't receive a letter, please call our office to schedule the follow-up appointment.

## 2011-10-09 ENCOUNTER — Other Ambulatory Visit: Payer: Self-pay | Admitting: *Deleted

## 2011-10-09 MED ORDER — RALOXIFENE HCL 60 MG PO TABS: 60.0000 mg | ORAL_TABLET | Freq: Every day | ORAL | Status: DC

## 2011-10-17 ENCOUNTER — Encounter (INDEPENDENT_AMBULATORY_CARE_PROVIDER_SITE_OTHER): Payer: Medicare Other

## 2011-10-17 DIAGNOSIS — I6529 Occlusion and stenosis of unspecified carotid artery: Secondary | ICD-10-CM | POA: Diagnosis not present

## 2011-10-17 DIAGNOSIS — I119 Hypertensive heart disease without heart failure: Secondary | ICD-10-CM

## 2011-10-17 DIAGNOSIS — R0989 Other specified symptoms and signs involving the circulatory and respiratory systems: Secondary | ICD-10-CM

## 2011-10-17 DIAGNOSIS — E7800 Pure hypercholesterolemia, unspecified: Secondary | ICD-10-CM

## 2011-10-24 ENCOUNTER — Telehealth: Payer: Self-pay | Admitting: *Deleted

## 2011-10-24 NOTE — Telephone Encounter (Signed)
Message copied by Burnell Blanks on Tue Oct 24, 2011  1:46 PM ------      Message from: Cassell Clement      Created: Mon Oct 23, 2011  6:05 PM       Please report.  The carotid dopplers did not show any significant blockage. Continue present meds.

## 2011-10-24 NOTE — Telephone Encounter (Signed)
Advised patient of results.  

## 2012-03-11 ENCOUNTER — Other Ambulatory Visit: Payer: Self-pay | Admitting: *Deleted

## 2012-03-11 DIAGNOSIS — F419 Anxiety disorder, unspecified: Secondary | ICD-10-CM

## 2012-03-11 MED ORDER — ALPRAZOLAM 0.25 MG PO TABS: ORAL_TABLET | ORAL | Status: DC

## 2012-03-18 DIAGNOSIS — H40059 Ocular hypertension, unspecified eye: Secondary | ICD-10-CM | POA: Diagnosis not present

## 2012-03-18 DIAGNOSIS — H52209 Unspecified astigmatism, unspecified eye: Secondary | ICD-10-CM | POA: Diagnosis not present

## 2012-03-18 DIAGNOSIS — Z961 Presence of intraocular lens: Secondary | ICD-10-CM | POA: Diagnosis not present

## 2012-03-21 DIAGNOSIS — Z23 Encounter for immunization: Secondary | ICD-10-CM | POA: Diagnosis not present

## 2012-03-29 ENCOUNTER — Other Ambulatory Visit: Payer: Self-pay | Admitting: *Deleted

## 2012-03-29 DIAGNOSIS — R0989 Other specified symptoms and signs involving the circulatory and respiratory systems: Secondary | ICD-10-CM

## 2012-03-29 DIAGNOSIS — E7800 Pure hypercholesterolemia, unspecified: Secondary | ICD-10-CM

## 2012-03-29 DIAGNOSIS — I119 Hypertensive heart disease without heart failure: Secondary | ICD-10-CM

## 2012-03-29 DIAGNOSIS — K449 Diaphragmatic hernia without obstruction or gangrene: Secondary | ICD-10-CM

## 2012-04-08 ENCOUNTER — Other Ambulatory Visit (INDEPENDENT_AMBULATORY_CARE_PROVIDER_SITE_OTHER): Payer: Medicare Other

## 2012-04-08 DIAGNOSIS — K449 Diaphragmatic hernia without obstruction or gangrene: Secondary | ICD-10-CM

## 2012-04-08 DIAGNOSIS — E7800 Pure hypercholesterolemia, unspecified: Secondary | ICD-10-CM

## 2012-04-08 DIAGNOSIS — I119 Hypertensive heart disease without heart failure: Secondary | ICD-10-CM

## 2012-04-08 DIAGNOSIS — R0989 Other specified symptoms and signs involving the circulatory and respiratory systems: Secondary | ICD-10-CM | POA: Diagnosis not present

## 2012-04-08 LAB — LIPID PANEL
Cholesterol: 181 mg/dL (ref 0–200)
HDL: 58 mg/dL (ref >39.00)
VLDL: 36.6 mg/dL (ref 0.0–40.0)

## 2012-04-08 LAB — BASIC METABOLIC PANEL
BUN: 13 mg/dL (ref 6–23)
GFR: 70.07 mL/min (ref >60.00)
Potassium: 4.2 mEq/L (ref 3.5–5.1)
Sodium: 140 mEq/L (ref 135–145)

## 2012-04-08 LAB — HEPATIC FUNCTION PANEL
ALT: 25 U/L (ref 0–35)
AST: 20 U/L (ref 0–37)
Bilirubin, Direct: 0.1 mg/dL (ref 0.0–0.3)
Total Protein: 6.8 g/dL (ref 6.0–8.3)

## 2012-04-08 NOTE — Progress Notes (Signed)
Quick Note:  Please make copy of labs for patient visit. ______ 

## 2012-04-15 ENCOUNTER — Encounter: Payer: Self-pay | Admitting: Cardiology

## 2012-04-15 ENCOUNTER — Ambulatory Visit (INDEPENDENT_AMBULATORY_CARE_PROVIDER_SITE_OTHER): Payer: Medicare Other | Admitting: Cardiology

## 2012-04-15 VITALS — BP 128/60 | HR 106 | Ht 59.0 in | Wt 101.0 lb

## 2012-04-15 DIAGNOSIS — R5383 Other fatigue: Secondary | ICD-10-CM

## 2012-04-15 DIAGNOSIS — I119 Hypertensive heart disease without heart failure: Secondary | ICD-10-CM

## 2012-04-15 DIAGNOSIS — R5381 Other malaise: Secondary | ICD-10-CM | POA: Insufficient documentation

## 2012-04-15 DIAGNOSIS — E7800 Pure hypercholesterolemia, unspecified: Secondary | ICD-10-CM | POA: Diagnosis not present

## 2012-04-15 NOTE — Assessment & Plan Note (Signed)
We reviewed her recent labs which are satisfactory.  Continue Lipitor 20 mg daily.

## 2012-04-15 NOTE — Progress Notes (Signed)
[name] Date of Birth:  1928-06-28 [facility] [address] [place], [place]  [address] [phone]         Fax   [phone]  History of Present Illness: This pleasant 78 year old woman is seen for a scheduled followup office visit. She has a history of high blood pressure and hypercholesterolemia. She has a past history of breast cancer left breast with no evidence of recurrence. She has a history of osteopenia. Recently she has noted an increased and her blood pressure.  After her last visit 6 months ago we did a carotid Doppler because of a soft right carotid bruit.  The Doppler was done on 10/17/11 and showed no evidence of obstruction.   Current Outpatient Prescriptions  Medication Sig Dispense Refill  . ALPRAZolam (XANAX) 0.25 MG tablet Daily as needed  30 tablet  5  . Ascorbic Acid (VITAMIN C) 500 MG tablet Take 500 mg by mouth daily.        Marland Kitchen aspirin 325 MG tablet Take 325 mg by mouth daily.        Marland Kitchen atorvastatin (LIPITOR) 20 MG tablet TAKE ONE (1) TABLET(S) ONCE DAILY  90 tablet  3  . Calcium Carbonate-Vitamin D (CALTRATE 600+D PO) Take by mouth 2 (two) times daily.        . Cholecalciferol (VITAMIN D) 1000 UNITS capsule Take 1,000 Units by mouth daily.        . fish oil-omega-3 fatty acids 1000 MG capsule Take 1 g by mouth daily.        . Ibuprofen-Diphenhydramine Cit (ADVIL PM PO) Take by mouth at bedtime as needed.        . metoprolol succinate (TOPROL-XL) 25 MG 24 hr tablet Take by mouth. Take 1/2 to 1 tablet daily, as directed      . Multiple Vitamin (MULTIVITAMIN) tablet Take 1 tablet by mouth daily.        . niacin 500 MG tablet Take 500 mg by mouth 2 (two) times daily.        Marland Kitchen omeprazole (PRILOSEC) 20 MG capsule Take 20 mg by mouth daily.        . Psyllium (METAMUCIL PO) Take by mouth.        . raloxifene (EVISTA) 60 MG tablet Take 1 tablet (60 mg total) by mouth daily.  30 tablet  6  . vitamin B-12 (CYANOCOBALAMIN) 500 MCG tablet Take 500  mcg by mouth 2 (two) times daily.        . [DISCONTINUED] metoprolol succinate (TOPROL XL) 25 MG 24 hr tablet Take 1 tablet (25 mg total) by mouth daily.  90 tablet  3    No Known Allergies  Patient Active Problem List  Diagnosis  . Benign hypertensive heart disease without heart failure  . Hypercholesterolemia  . History of breast cancer  . Osteopenia  . Hiatal hernia  . Right carotid bruit    History  Smoking status  . Never Smoker   Smokeless tobacco  . Not on file    History  Alcohol Use     Family History  Problem Relation Age of Onset  . Arthritis Father   . Stroke Father   . Hypertension Mother     Review of Systems: Constitutional: no fever chills diaphoresis or fatigue or change in weight.  Head and neck: no hearing loss, no epistaxis, no photophobia or visual disturbance. Respiratory: No cough, shortness of breath or wheezing. Cardiovascular: No chest pain peripheral edema, palpitations. Gastrointestinal:  No abdominal distention, no abdominal pain, no change in bowel habits hematochezia or melena. Genitourinary: No dysuria, no frequency, no urgency, no nocturia. Musculoskeletal:No arthralgias, no back pain, no gait disturbance or myalgias. Neurological: No dizziness, no headaches, no numbness, no seizures, no syncope, no weakness, no tremors. Hematologic: No lymphadenopathy, no easy bruising. Psychiatric: No confusion, no hallucinations, no sleep disturbance.    Physical Exam: Filed Vitals:   04/15/12 1525  BP: 128/60  Pulse: 106   general appearance reveals a well-developed well-nourished woman in no distress.The head and neck exam reveals pupils equal and reactive.  Extraocular movements are full.  There is no scleral icterus.  The mouth and pharynx are normal.  The neck is supple.  The carotids reveal a very soft right carotid bruit.  The jugular venous pressure is normal.  The  thyroid is not enlarged.  There is no lymphadenopathy.  The chest is  clear to percussion and auscultation.  There are no rales or rhonchi.  Expansion of the chest is symmetrical.  The precordium is quiet.  The first heart sound is normal.  The second heart sound is physiologically split.  There is no murmur gallop rub or click.  There is no abnormal lift or heave.  The abdomen is soft and nontender.  The bowel sounds are normal.  The liver and spleen are not enlarged.  There are no abdominal masses.  There are no abdominal bruits.  Extremities reveal good pedal pulses.  There is no phlebitis or edema.  There is no cyanosis or clubbing.  Strength is normal and symmetrical in all extremities.  There is no lateralizing weakness.  There are no sensory deficits.  The skin is warm and dry.  There is no rash.  EKG shows sinus tachycardia at 106 per minute.  No ischemic changes   Assessment / Plan: Continue on same medication.  Recheck in 6 months for followup office visit lipid panel hepatic function panel and basal metabolic panel

## 2012-04-15 NOTE — Assessment & Plan Note (Signed)
The patient has noted that her temperature recently has been a degree below what it use to run for many years.  She has also had some increased fatigue.  She was concerned about possible thyroid deficiency.  We will check a TSH and also a CBC

## 2012-04-15 NOTE — Assessment & Plan Note (Signed)
Patient is not expressing any chest pain or shortness of breath.  She is on metoprolol succinate and is tolerating it well

## 2012-04-15 NOTE — Patient Instructions (Addendum)
Your physician recommends that you continue on your current medications as directed. Please refer to the Current Medication list given to you today.  Your physician wants you to follow-up in: 6 months with fasting labs (lp/bmet/hfp)  You will receive a reminder letter in the mail two months in advance. If you don't receive a letter, please call our office to schedule the follow-up appointment.   Will obtain labs today and call you with the results (cbc/tsh)

## 2012-04-16 ENCOUNTER — Telehealth: Payer: Self-pay | Admitting: *Deleted

## 2012-04-16 LAB — TSH: TSH: 1.63 u[IU]/mL (ref 0.35–5.50)

## 2012-04-16 LAB — CBC WITH DIFFERENTIAL/PLATELET
Basophils Absolute: 0 10*3/uL (ref 0.0–0.1)
Basophils Relative: 0.1 % (ref 0.0–3.0)
Eosinophils Absolute: 0.3 10*3/uL (ref 0.0–0.7)
Lymphocytes Relative: 27.1 % (ref 12.0–46.0)
MCHC: 33 g/dL (ref 30.0–36.0)
Neutrophils Relative %: 63.1 % (ref 43.0–77.0)
Platelets: 222 10*3/uL (ref 150.0–400.0)
RBC: 4.42 Mil/uL (ref 3.87–5.11)

## 2012-04-16 NOTE — Telephone Encounter (Signed)
Message copied by Burnell Blanks on Tue Apr 16, 2012  2:51 PM ------      Message from: Cassell Clement      Created: Tue Apr 16, 2012  2:38 PM       Please report to patient.  The recent labs are stable. Continue same medication and careful diet.Thyroid functiion is normal.  No anemia. WBC normal.

## 2012-04-16 NOTE — Telephone Encounter (Signed)
Advised patient of lab results  

## 2012-04-16 NOTE — Progress Notes (Signed)
Quick Note:  Please report to patient. The recent labs are stable. Continue same medication and careful diet.Thyroid functiion is normal. No anemia. WBC normal. ______

## 2012-04-26 ENCOUNTER — Other Ambulatory Visit: Payer: Self-pay | Admitting: *Deleted

## 2012-04-26 MED ORDER — ATORVASTATIN CALCIUM 20 MG PO TABS: 20.0000 mg | ORAL_TABLET | Freq: Every day | ORAL | Status: DC

## 2012-05-14 ENCOUNTER — Other Ambulatory Visit: Payer: Self-pay | Admitting: Cardiology

## 2012-05-14 MED ORDER — RALOXIFENE HCL 60 MG PO TABS: 60.0000 mg | ORAL_TABLET | Freq: Every day | ORAL | Status: DC

## 2012-08-09 ENCOUNTER — Other Ambulatory Visit: Payer: Self-pay | Admitting: *Deleted

## 2012-08-09 DIAGNOSIS — F419 Anxiety disorder, unspecified: Secondary | ICD-10-CM

## 2012-08-09 MED ORDER — ALPRAZOLAM 0.25 MG PO TABS: ORAL_TABLET | ORAL | Status: DC

## 2012-08-22 ENCOUNTER — Encounter: Payer: Self-pay | Admitting: Internal Medicine

## 2012-10-07 ENCOUNTER — Ambulatory Visit (INDEPENDENT_AMBULATORY_CARE_PROVIDER_SITE_OTHER): Payer: Medicare Other | Admitting: Cardiology

## 2012-10-07 DIAGNOSIS — E7800 Pure hypercholesterolemia, unspecified: Secondary | ICD-10-CM

## 2012-10-07 DIAGNOSIS — I119 Hypertensive heart disease without heart failure: Secondary | ICD-10-CM | POA: Diagnosis not present

## 2012-10-07 LAB — HEPATIC FUNCTION PANEL
Albumin: 4.1 g/dL (ref 3.5–5.2)
Total Protein: 7.4 g/dL (ref 6.0–8.3)

## 2012-10-07 LAB — BASIC METABOLIC PANEL
CO2: 26 mEq/L (ref 19–32)
Chloride: 105 mEq/L (ref 96–112)
Potassium: 4.1 mEq/L (ref 3.5–5.1)

## 2012-10-07 LAB — LIPID PANEL
Cholesterol: 173 mg/dL (ref 0–200)
LDL Cholesterol: 76 mg/dL (ref 0–99)
Total CHOL/HDL Ratio: 3
Triglycerides: 184 mg/dL — ABNORMAL HIGH (ref 0.0–149.0)
VLDL: 36.8 mg/dL (ref 0.0–40.0)

## 2012-10-07 NOTE — Progress Notes (Signed)
Quick Note:  Please make copy of labs for patient visit. ______ 

## 2012-10-14 ENCOUNTER — Other Ambulatory Visit: Payer: Medicare Other

## 2012-10-22 ENCOUNTER — Ambulatory Visit (INDEPENDENT_AMBULATORY_CARE_PROVIDER_SITE_OTHER): Payer: Medicare Other | Admitting: Cardiology

## 2012-10-22 ENCOUNTER — Encounter: Payer: Self-pay | Admitting: Cardiology

## 2012-10-22 VITALS — BP 126/72 | HR 95 | Ht 59.0 in | Wt 100.8 lb

## 2012-10-22 DIAGNOSIS — K449 Diaphragmatic hernia without obstruction or gangrene: Secondary | ICD-10-CM

## 2012-10-22 DIAGNOSIS — I498 Other specified cardiac arrhythmias: Secondary | ICD-10-CM

## 2012-10-22 DIAGNOSIS — R0989 Other specified symptoms and signs involving the circulatory and respiratory systems: Secondary | ICD-10-CM | POA: Diagnosis not present

## 2012-10-22 DIAGNOSIS — E7800 Pure hypercholesterolemia, unspecified: Secondary | ICD-10-CM | POA: Diagnosis not present

## 2012-10-22 DIAGNOSIS — R000 Tachycardia, unspecified: Secondary | ICD-10-CM

## 2012-10-22 MED ORDER — OMEPRAZOLE 20 MG PO CPDR: 20.0000 mg | DELAYED_RELEASE_CAPSULE | Freq: Every day | ORAL | Status: DC

## 2012-10-22 NOTE — Assessment & Plan Note (Signed)
Patient has a history of elevated cholesterol and is on low-dose Lipitor.  She's not having any side effects and her numbers are satisfactory

## 2012-10-22 NOTE — Assessment & Plan Note (Signed)
The patient has had no TIA symptoms referable to her right carotid bruit.  She has received a letter for a one-year followup of her carotid Doppler and she will proceed with that.

## 2012-10-22 NOTE — Assessment & Plan Note (Signed)
She has a history of hiatal hernia with some symptoms of reflux and is on brand-name Prilosec 20 mg daily which she gets from her company [employer].  We will continue this.  We will check a CBC next time.  She does take vitamin B12 daily

## 2012-10-22 NOTE — Progress Notes (Signed)
[name] Date of Birth:  April 28, 1929 [facility] [address] [place], [place]  [address] [phone]         Fax   [phone]  History of Present Illness: This pleasant 79 year old woman is seen for a scheduled followup office visit. She has a history of high blood pressure and hypercholesterolemia. She has a past history of breast cancer left breast in 1986 and in 1989.  She had a mastectomy in 1989.  There has been no evidence of recurrence. She has a history of osteopenia. Recently she has noted an increased pulse and her blood pressure. After her last visit 6 months ago we did a carotid Doppler because of a soft right carotid bruit. The Doppler was done on 10/17/11 and showed no evidence of obstruction.   Current Outpatient Prescriptions  Medication Sig Dispense Refill  . ALPRAZolam (XANAX) 0.25 MG tablet Daily as needed  30 tablet  5  . Ascorbic Acid (VITAMIN C) 500 MG tablet Take 500 mg by mouth daily.        Marland Kitchen aspirin 325 MG tablet Take 325 mg by mouth daily.        Marland Kitchen atorvastatin (LIPITOR) 20 MG tablet Take 1 tablet (20 mg total) by mouth daily.  90 tablet  3  . Calcium Carbonate-Vitamin D (CALTRATE 600+D PO) Take by mouth 2 (two) times daily.        . Cholecalciferol (VITAMIN D) 1000 UNITS capsule Take 1,000 Units by mouth daily.        . fish oil-omega-3 fatty acids 1000 MG capsule Take 1 g by mouth daily.        . Ibuprofen-Diphenhydramine Cit (ADVIL PM PO) Take by mouth at bedtime as needed.        . Multiple Vitamin (MULTIVITAMIN) tablet Take 1 tablet by mouth daily.        . niacin 500 MG tablet Take 500 mg by mouth 2 (two) times daily.        Marland Kitchen omeprazole (PRILOSEC) 20 MG capsule Take 1 capsule (20 mg total) by mouth daily.  180 capsule  1  . Psyllium (METAMUCIL PO) Take by mouth.        . raloxifene (EVISTA) 60 MG tablet Take 1 tablet (60 mg total) by mouth daily.  30 tablet  11  . vitamin B-12 (CYANOCOBALAMIN) 500 MCG tablet Take 500 mcg by  mouth 2 (two) times daily.        . metoprolol succinate (TOPROL-XL) 25 MG 24 hr tablet Take by mouth. Take 1/2 to 1 tablet daily, as directed       No current facility-administered medications for this visit.    No Known Allergies  Patient Active Problem List   Diagnosis Date Noted  . Benign hypertensive heart disease without heart failure 09/01/2010    Priority: High  . Hypercholesterolemia 09/01/2010    Priority: Medium  . Malaise and fatigue 04/15/2012  . Right carotid bruit 10/05/2011  . Hiatal hernia 04/05/2011  . History of breast cancer 09/01/2010  . Osteopenia 09/01/2010    History  Smoking status  . Never Smoker   Smokeless tobacco  . Not on file    History  Alcohol Use     Family History  Problem Relation Age of Onset  . Arthritis Father   . Stroke Father   . Hypertension Mother     Review of Systems: Constitutional: no fever chills diaphoresis or fatigue or change in weight.  Head and  neck: no hearing loss, no epistaxis, no photophobia or visual disturbance. Respiratory: No cough, shortness of breath or wheezing. Cardiovascular: No chest pain peripheral edema, palpitations. Gastrointestinal: No abdominal distention, no abdominal pain, no change in bowel habits hematochezia or melena. Genitourinary: No dysuria, no frequency, no urgency, no nocturia. Musculoskeletal:No arthralgias, no back pain, no gait disturbance or myalgias. Neurological: No dizziness, no headaches, no numbness, no seizures, no syncope, no weakness, no tremors. Hematologic: No lymphadenopathy, no easy bruising. Psychiatric: No confusion, no hallucinations, no sleep disturbance.    Physical Exam: Filed Vitals:   10/22/12 1529  BP: 126/72  Pulse: 95   the general appearance reveals a well-developed well-nourished woman in no distress.The head and neck exam reveals pupils equal and reactive.  Extraocular movements are full.  There is no scleral icterus.  The mouth and pharynx are  normal.  The neck is supple.  The carotids reveal no bruits.  The jugular venous pressure is normal.  The  thyroid is not enlarged.  There is no lymphadenopathy.  The chest is clear to percussion and auscultation.  There are no rales or rhonchi.  Expansion of the chest is symmetrical.  The precordium is quiet.  The first heart sound is normal.  The second heart sound is physiologically split.  There is no murmur gallop rub or click.  There is no abnormal lift or heave.  The abdomen is soft and nontender.  The bowel sounds are normal.  The liver and spleen are not enlarged.  There are no abdominal masses.  There are no abdominal bruits.  Extremities reveal good pedal pulses.  There is no phlebitis or edema.  There is no cyanosis or clubbing.  Strength is normal and symmetrical in all extremities.  There is no lateralizing weakness.  There are no sensory deficits.  The skin is warm and dry.  There is no rash.     Assessment / Plan: Continue on same medication.  Recheck in 6 months for followup office visit CBC lipid panel basal metabolic panel and hepatic function panel.  She has received a followup notice about her 10 year colonoscopy from Dr. Regino Schultze office

## 2012-10-22 NOTE — Patient Instructions (Addendum)
Your physician recommends that you continue on your current medications as directed. Please refer to the Current Medication list given to you today.  Your physician wants you to follow-up in: 6 months with fasting labs (lp/bmet/hfp)  You will receive a reminder letter in the mail two months in advance. If you don't receive a letter, please call our office to schedule the follow-up appointment.  

## 2012-10-31 ENCOUNTER — Encounter (INDEPENDENT_AMBULATORY_CARE_PROVIDER_SITE_OTHER): Payer: Medicare Other

## 2012-10-31 DIAGNOSIS — R0989 Other specified symptoms and signs involving the circulatory and respiratory systems: Secondary | ICD-10-CM

## 2012-10-31 DIAGNOSIS — I6529 Occlusion and stenosis of unspecified carotid artery: Secondary | ICD-10-CM

## 2012-11-08 ENCOUNTER — Telehealth: Payer: Self-pay | Admitting: *Deleted

## 2012-11-08 NOTE — Telephone Encounter (Signed)
Advised patient of results.  

## 2012-11-08 NOTE — Telephone Encounter (Signed)
Message copied by Burnell Blanks on Fri Nov 08, 2012  2:38 PM ------      Message from: Cassell Clement      Created: Mon Nov 04, 2012  8:34 PM       The carotid arteries are stable. No progression of minimal plaque. CSD. ------

## 2012-11-22 ENCOUNTER — Encounter: Payer: Self-pay | Admitting: *Deleted

## 2012-12-31 ENCOUNTER — Ambulatory Visit (INDEPENDENT_AMBULATORY_CARE_PROVIDER_SITE_OTHER): Payer: Medicare Other | Admitting: Internal Medicine

## 2012-12-31 ENCOUNTER — Encounter: Payer: Self-pay | Admitting: Internal Medicine

## 2012-12-31 VITALS — BP 120/72 | HR 100 | Ht 59.0 in | Wt 101.0 lb

## 2012-12-31 DIAGNOSIS — Z1211 Encounter for screening for malignant neoplasm of colon: Secondary | ICD-10-CM | POA: Diagnosis not present

## 2012-12-31 NOTE — Progress Notes (Signed)
[name] 11/18/1928 MRN [id number]  History of Present Illness:  This is an 79 year old white female who is here to discuss a recall colonoscopy. She is asymptomatic. Her last exam in April 2004 showed mild sigmoid diverticulosis. There is no family history of colon cancer. She denies rectal bleeding, abdominal pain or change in weight. She has been followed by Dr. Patty [name] for health maintenance as well as for hypertension and hyperlipidemia. She had breast cancer in 1986 and 1989 and has remained disease free.   Past Medical History  Diagnosis Date  . Hypercholesteremia   . Breast cancer   . Hypertension   . Osteopenia   . Osteoporosis   . Hiatal hernia   . Diverticulosis    Past Surgical History  Procedure Laterality Date  . Mastectomy  1999    LEFT  . Abdominal hysterectomy      reports that she has never smoked. She has never used smokeless tobacco. Her alcohol and drug histories are not on file. family history includes Arthritis in her father; Hypertension in her mother; and Stroke in her father. No Known Allergies      Review of Systems: Negative for rectal bleeding abdominal pain or constipation  The remainder of the 10 point ROS is negative except as outlined in H&P   Physical Exam: General appearance  Well developed, in no distress. Eyes- non icteric. HEENT nontraumatic, normocephalic. Mouth no lesions, tongue papillated, no cheilosis. Neck supple without adenopathy, thyroid not enlarged, no carotid bruits, no JVD. Lungs Clear to auscultation bilaterally. Cor normal S1, normal S2, regular rhythm, no murmur,  quiet precordium. Abdomen: Soft mildly protuberant with normoactive bowel sounds. No tenderness. No mass. No bruit. Rectal: Normal perianal area. Normal rectal sphincter tone. Small amount of Hemoccult negative stool in the ampulla. Extremities no pedal edema. Skin no lesions. Neurological alert and oriented x 3. Psychological normal mood and  affect.  Assessment and Plan:  Problem #9 79 year old white female who is asymptomatic as to any change in bowel habits. She is due for a recall colonoscopy. Her last exam was in April 2004. Due to her advanced age, I would not recommend a colonoscopy at this time. Patient agrees with the plan not to proceed with a routine screening colonoscopy. She will continue Metamucil 1 heaping teaspoon daily for diverticulosis. She will follow up with Dr [name]. I will see her if she develops specific GI problems.   12/31/2012 [name]

## 2012-12-31 NOTE — Patient Instructions (Addendum)
Dr Patty Sermons

## 2013-01-06 ENCOUNTER — Telehealth: Payer: Self-pay | Admitting: Cardiology

## 2013-01-06 NOTE — Telephone Encounter (Signed)
New problem    Pt has a question regarding lipitor dosage

## 2013-01-06 NOTE — Telephone Encounter (Signed)
Okay to reduce the dose to 10 mg daily

## 2013-01-06 NOTE — Telephone Encounter (Signed)
Advised patient

## 2013-01-06 NOTE — Telephone Encounter (Signed)
Would like to decrease her Lipitor to 10 mg daily instead of 20 mg. Patient states she has had some weakness in her legs and soreness/stiffness. Will forward to  Dr. Patty Sermons for review

## 2013-03-18 DIAGNOSIS — Z23 Encounter for immunization: Secondary | ICD-10-CM | POA: Diagnosis not present

## 2013-03-19 DIAGNOSIS — H2640 Unspecified secondary cataract: Secondary | ICD-10-CM | POA: Diagnosis not present

## 2013-03-19 DIAGNOSIS — H3530 Unspecified macular degeneration: Secondary | ICD-10-CM | POA: Diagnosis not present

## 2013-03-19 DIAGNOSIS — Z961 Presence of intraocular lens: Secondary | ICD-10-CM | POA: Diagnosis not present

## 2013-03-19 DIAGNOSIS — H43819 Vitreous degeneration, unspecified eye: Secondary | ICD-10-CM | POA: Diagnosis not present

## 2013-03-20 ENCOUNTER — Other Ambulatory Visit: Payer: Self-pay | Admitting: Cardiology

## 2013-03-20 MED ORDER — ALPRAZOLAM 0.25 MG PO TABS: 0.2500 mg | ORAL_TABLET | Freq: Every evening | ORAL | Status: DC | PRN

## 2013-03-20 NOTE — Addendum Note (Signed)
Addended by: Carmela Hurt on: 03/20/2013 05:53 PM   Modules accepted: Orders

## 2013-03-31 ENCOUNTER — Other Ambulatory Visit (INDEPENDENT_AMBULATORY_CARE_PROVIDER_SITE_OTHER): Payer: Medicare Other

## 2013-03-31 DIAGNOSIS — E7800 Pure hypercholesterolemia, unspecified: Secondary | ICD-10-CM

## 2013-03-31 DIAGNOSIS — I498 Other specified cardiac arrhythmias: Secondary | ICD-10-CM

## 2013-03-31 DIAGNOSIS — R000 Tachycardia, unspecified: Secondary | ICD-10-CM

## 2013-03-31 LAB — CBC WITH DIFFERENTIAL/PLATELET
Basophils Absolute: 0 10*3/uL (ref 0.0–0.1)
Basophils Relative: 0.2 % (ref 0.0–3.0)
Eosinophils Absolute: 0.4 10*3/uL (ref 0.0–0.7)
HCT: 41.1 % (ref 36.0–46.0)
Hemoglobin: 13.8 g/dL (ref 12.0–15.0)
Lymphs Abs: 2.7 10*3/uL (ref 0.7–4.0)
MCHC: 33.4 g/dL (ref 30.0–36.0)
MCV: 90.6 fl (ref 78.0–100.0)
Monocytes Absolute: 0.6 10*3/uL (ref 0.1–1.0)
Monocytes Relative: 6.7 % (ref 3.0–12.0)
Neutro Abs: 5.6 10*3/uL (ref 1.4–7.7)
Platelets: 247 10*3/uL (ref 150.0–400.0)
RBC: 4.54 Mil/uL (ref 3.87–5.11)
RDW: 13.5 % (ref 11.5–14.6)

## 2013-03-31 LAB — BASIC METABOLIC PANEL
BUN: 13 mg/dL (ref 6–23)
Calcium: 9.7 mg/dL (ref 8.4–10.5)
GFR: 67.1 mL/min (ref >60.00)
Glucose, Bld: 110 mg/dL — ABNORMAL HIGH (ref 70–99)
Potassium: 3.7 mEq/L (ref 3.5–5.1)
Sodium: 139 mEq/L (ref 135–145)

## 2013-03-31 LAB — HEPATIC FUNCTION PANEL
ALT: 21 U/L (ref 0–35)
AST: 19 U/L (ref 0–37)
Bilirubin, Direct: 0.1 mg/dL (ref 0.0–0.3)
Total Bilirubin: 0.5 mg/dL (ref 0.3–1.2)
Total Protein: 7 g/dL (ref 6.0–8.3)

## 2013-03-31 LAB — LIPID PANEL
Cholesterol: 172 mg/dL (ref 0–200)
HDL: 71.1 mg/dL (ref >39.00)
VLDL: 33.4 mg/dL (ref 0.0–40.0)

## 2013-04-01 ENCOUNTER — Ambulatory Visit (INDEPENDENT_AMBULATORY_CARE_PROVIDER_SITE_OTHER): Payer: Medicare Other | Admitting: Cardiology

## 2013-04-01 VITALS — BP 136/80 | HR 88 | Ht 59.0 in | Wt 99.2 lb

## 2013-04-01 DIAGNOSIS — E7800 Pure hypercholesterolemia, unspecified: Secondary | ICD-10-CM | POA: Diagnosis not present

## 2013-04-01 DIAGNOSIS — I119 Hypertensive heart disease without heart failure: Secondary | ICD-10-CM

## 2013-04-01 DIAGNOSIS — R0989 Other specified symptoms and signs involving the circulatory and respiratory systems: Secondary | ICD-10-CM | POA: Diagnosis not present

## 2013-04-01 MED ORDER — ATORVASTATIN CALCIUM 10 MG PO TABS: 10.0000 mg | ORAL_TABLET | Freq: Every day | ORAL | Status: DC

## 2013-04-01 MED ORDER — OMEPRAZOLE 20 MG PO CPDR: 20.0000 mg | DELAYED_RELEASE_CAPSULE | Freq: Every day | ORAL | Status: DC

## 2013-04-01 NOTE — Progress Notes (Signed)
[name] Date of Birth:  13-Jul-1928 [address] [place], [place]  [address] [phone]         Fax   [phone]  History of Present Illness: This pleasant 79 year old woman is seen for a scheduled followup office visit. She has a history of high blood pressure and hypercholesterolemia. She has a past history of breast cancer left breast in 1986 and in 1989.  She had a mastectomy in 1989.  There has been no evidence of recurrence. She has a history of osteopenia. Recently she has noted an increased pulse and her blood pressure. After her last visit 6 months ago we did a carotid Doppler because of a soft right carotid bruit. The Doppler was done on 10/17/11 and showed no evidence of obstruction.  Since last visit she is on a lower dose of Lipitor just 10 mg a day.  Her blood values are actually better this time.   Current Outpatient Prescriptions  Medication Sig Dispense Refill  . ALPRAZolam (XANAX) 0.25 MG tablet Take 1 tablet (0.25 mg total) by mouth at bedtime as needed for sleep.  30 tablet  0  . Ascorbic Acid (VITAMIN C) 500 MG tablet Take 500 mg by mouth daily.        Marland Kitchen aspirin 325 MG tablet Take 325 mg by mouth daily.        Marland Kitchen atorvastatin (LIPITOR) 10 MG tablet Take 1 tablet (10 mg total) by mouth daily at 6 PM.  90 tablet  3  . Calcium Carbonate-Vitamin D (CALTRATE 600+D PO) Take by mouth 2 (two) times daily.        . Cholecalciferol (VITAMIN D) 1000 UNITS capsule Take 1,000 Units by mouth daily.        . fish oil-omega-3 fatty acids 1000 MG capsule Take 1 g by mouth daily.        . Ibuprofen-Diphenhydramine Cit (ADVIL PM PO) Take by mouth at bedtime as needed.        . Multiple Vitamin (MULTIVITAMIN) tablet Take 1 tablet by mouth daily.        . niacin 500 MG tablet Take 500 mg by mouth 2 (two) times daily.        Marland Kitchen omeprazole (PRILOSEC) 20 MG capsule Take 1 capsule (20 mg total) by mouth daily.  90 capsule  3  . Psyllium (METAMUCIL PO) Take by mouth.          . raloxifene (EVISTA) 60 MG tablet Take 1 tablet (60 mg total) by mouth daily.  30 tablet  11  . vitamin B-12 (CYANOCOBALAMIN) 500 MCG tablet Take 500 mcg by mouth daily.       . metoprolol succinate (TOPROL-XL) 25 MG 24 hr tablet Take by mouth. Take 1/2 to 1 tablet daily, as directed       No current facility-administered medications for this visit.    No Known Allergies  Patient Active Problem List   Diagnosis Date Noted  . Benign hypertensive heart disease without heart failure 09/01/2010    Priority: High  . Hypercholesterolemia 09/01/2010    Priority: Medium  . Malaise and fatigue 04/15/2012  . Right carotid bruit 10/05/2011  . Hiatal hernia 04/05/2011  . History of breast cancer 09/01/2010  . Osteopenia 09/01/2010    History  Smoking status  . Never Smoker   Smokeless tobacco  . Never Used    History  Alcohol Use: Not on file    Family History  Problem Relation Age  of Onset  . Arthritis Father   . Stroke Father   . Hypertension Mother     Review of Systems: Constitutional: no fever chills diaphoresis or fatigue or change in weight.  Head and neck: no hearing loss, no epistaxis, no photophobia or visual disturbance. Respiratory: No cough, shortness of breath or wheezing. Cardiovascular: No chest pain peripheral edema, palpitations. Gastrointestinal: No abdominal distention, no abdominal pain, no change in bowel habits hematochezia or melena. Genitourinary: No dysuria, no frequency, no urgency, no nocturia. Musculoskeletal:No arthralgias, no back pain, no gait disturbance or myalgias. Neurological: No dizziness, no headaches, no numbness, no seizures, no syncope, no weakness, no tremors. Hematologic: No lymphadenopathy, no easy bruising. Psychiatric: No confusion, no hallucinations, no sleep disturbance.    Physical Exam: Filed Vitals:   04/01/13 1401  BP: 136/80  Pulse: 88   the general appearance reveals a well-developed well-nourished woman in no  distress.The head and neck exam reveals pupils equal and reactive.  Extraocular movements are full.  There is no scleral icterus.  The mouth and pharynx are normal.  The neck is supple.  The carotids reveal no bruits.  The jugular venous pressure is normal.  The  thyroid is not enlarged.  There is no lymphadenopathy.  The chest is clear to percussion and auscultation.  There are no rales or rhonchi.  Expansion of the chest is symmetrical.  The precordium is quiet.  The first heart sound is normal.  The second heart sound is physiologically split.  There is no murmur gallop rub or click.  There is no abnormal lift or heave.  The abdomen is soft and nontender.  The bowel sounds are normal.  The liver and spleen are not enlarged.  There are no abdominal masses.  There are no abdominal bruits.  Extremities reveal good pedal pulses.  There is no phlebitis or edema.  There is no cyanosis or clubbing.  Strength is normal and symmetrical in all extremities.  There is no lateralizing weakness.  There are no sensory deficits.  The skin is warm and dry.  There is no rash.     Assessment / Plan: Continue on same medication.  Continue Lipitor 10 mg daily.  Recheck 6 months for office visit EKG lipid panel hepatic function panel and basal metabolic panel

## 2013-04-01 NOTE — Patient Instructions (Signed)
Your physician recommends that you continue on your current medications as directed. Please refer to the Current Medication list given to you today.  Your physician wants you to follow-up in: 6 months with fasting labs (lp/bmet/hfp) and EKG  You will receive a reminder letter in the mail two months in advance. If you don't receive a letter, please call our office to schedule the follow-up appointment.   

## 2013-04-01 NOTE — Progress Notes (Signed)
Quick Note:  Please make copy of labs for patient visit. ______ 

## 2013-04-01 NOTE — Assessment & Plan Note (Signed)
Blood pressure at home has been running normal.  On arrival here today it was slightly elevated.  On repeat it had come down to 136/80 in the right arm sitting.  He is not having any symptoms of high blood pressure.  No dizziness or syncope or headaches.  No shortness of breath.

## 2013-04-01 NOTE — Assessment & Plan Note (Signed)
Lipids values are satisfactory on current therapy.

## 2013-04-01 NOTE — Assessment & Plan Note (Signed)
She has not had any TIA symptoms.

## 2013-05-28 ENCOUNTER — Other Ambulatory Visit: Payer: Self-pay | Admitting: Cardiology

## 2013-06-09 ENCOUNTER — Other Ambulatory Visit: Payer: Self-pay | Admitting: Cardiology

## 2013-06-09 DIAGNOSIS — F419 Anxiety disorder, unspecified: Secondary | ICD-10-CM

## 2013-07-01 ENCOUNTER — Other Ambulatory Visit: Payer: Self-pay | Admitting: Cardiology

## 2013-07-01 DIAGNOSIS — M810 Age-related osteoporosis without current pathological fracture: Secondary | ICD-10-CM

## 2013-08-12 ENCOUNTER — Other Ambulatory Visit: Payer: Self-pay | Admitting: Cardiology

## 2013-08-12 DIAGNOSIS — M8580 Other specified disorders of bone density and structure, unspecified site: Secondary | ICD-10-CM

## 2013-08-12 NOTE — Telephone Encounter (Signed)
Rip Harbour please review

## 2013-09-11 ENCOUNTER — Other Ambulatory Visit: Payer: Self-pay | Admitting: Cardiology

## 2013-10-02 ENCOUNTER — Ambulatory Visit (INDEPENDENT_AMBULATORY_CARE_PROVIDER_SITE_OTHER): Payer: Medicare Other | Admitting: Cardiology

## 2013-10-02 ENCOUNTER — Encounter: Payer: Self-pay | Admitting: Cardiology

## 2013-10-02 VITALS — BP 148/78 | HR 109 | Ht 59.0 in | Wt 100.0 lb

## 2013-10-02 DIAGNOSIS — I10 Essential (primary) hypertension: Secondary | ICD-10-CM

## 2013-10-02 DIAGNOSIS — E7800 Pure hypercholesterolemia, unspecified: Secondary | ICD-10-CM

## 2013-10-02 DIAGNOSIS — I119 Hypertensive heart disease without heart failure: Secondary | ICD-10-CM | POA: Diagnosis not present

## 2013-10-02 DIAGNOSIS — R0989 Other specified symptoms and signs involving the circulatory and respiratory systems: Secondary | ICD-10-CM | POA: Diagnosis not present

## 2013-10-02 MED ORDER — OMEPRAZOLE 20 MG PO CPDR: 20.0000 mg | DELAYED_RELEASE_CAPSULE | Freq: Every day | ORAL | Status: DC

## 2013-10-02 NOTE — Assessment & Plan Note (Signed)
The patient will return soon for fasting lab work.  There is no longer in need for her to continue with niacin or fish oil.  Continue statin therapy.

## 2013-10-02 NOTE — Progress Notes (Signed)
[name] Date of Birth:  1929/03/04 [place] [address] [place], [place]  [address] [phone]        Fax   [phone]   History of Present Illness: This pleasant 80 year old woman is seen for a scheduled followup office visit. She has a history of high blood pressure and hypercholesterolemia. She has a past history of breast cancer left breast in 1986 and in 1989. She had a mastectomy in 1989. There has been no evidence of recurrence. She has a history of osteopenia.  After her last visit 6 months ago we did a carotid Doppler because of a soft right carotid bruit. The Doppler was done on 10/17/11 and showed no evidence of obstruction. Since last visit she is on a lower dose of Lipitor just 10 mg a day.  She has had no new cardiac symptoms.  She denies chest pain.   Current Outpatient Prescriptions  Medication Sig Dispense Refill  . ALPRAZolam (XANAX) 0.25 MG tablet TAKE 1 TABLET AT BEDTIME AS NEEDED FOR SLEEP.  30 tablet  3  . Ascorbic Acid (VITAMIN C) 500 MG tablet Take 500 mg by mouth daily.        Marland Kitchen aspirin 325 MG tablet Take 325 mg by mouth daily.        Marland Kitchen atorvastatin (LIPITOR) 10 MG tablet Take 1 tablet (10 mg total) by mouth daily at 6 PM.  90 tablet  3  . Cholecalciferol (VITAMIN D) 1000 UNITS capsule Take 1,000 Units by mouth daily.        . Coenzyme Q10 (CO Q-10) 200 MG CAPS Take by mouth.      . Ibuprofen-Diphenhydramine Cit (ADVIL PM PO) Take by mouth at bedtime as needed.        . Multiple Vitamin (MULTIVITAMIN) tablet Take 1 tablet by mouth daily.        Marland Kitchen omeprazole (PRILOSEC) 20 MG capsule Take 1 capsule (20 mg total) by mouth daily.  90 capsule  3  . Psyllium (METAMUCIL PO) Take by mouth.        . raloxifene (EVISTA) 60 MG tablet TAKE 1 TABLET BY MOUTH DAILY  30 tablet  2  . vitamin B-12 (CYANOCOBALAMIN) 500 MCG tablet Take 500 mcg by mouth daily.        No current facility-administered medications for this visit.    No Known  Allergies  Patient Active Problem List   Diagnosis Date Noted  . Benign hypertensive heart disease without heart failure 09/01/2010    Priority: High  . Hypercholesterolemia 09/01/2010    Priority: Medium  . Malaise and fatigue 04/15/2012  . Right carotid bruit 10/05/2011  . Hiatal hernia 04/05/2011  . History of breast cancer 09/01/2010  . Osteopenia 09/01/2010    History  Smoking status  . Never Smoker   Smokeless tobacco  . Never Used    History  Alcohol Use: Not on file    Family History  Problem Relation Age of Onset  . Arthritis Father   . Stroke Father   . Hypertension Mother     Review of Systems: Constitutional: no fever chills diaphoresis or fatigue or change in weight.  Head and neck: no hearing loss, no epistaxis, no photophobia or visual disturbance. Respiratory: No cough, shortness of breath or wheezing. Cardiovascular: No chest pain peripheral edema, palpitations. Gastrointestinal: No abdominal distention, no abdominal pain, no change in bowel habits hematochezia or melena. Genitourinary: No dysuria, no frequency, no urgency, no  nocturia. Musculoskeletal:No arthralgias, no back pain, no gait disturbance or myalgias. Neurological: No dizziness, no headaches, no numbness, no seizures, no syncope, no weakness, no tremors. Hematologic: No lymphadenopathy, no easy bruising. Psychiatric: No confusion, no hallucinations, no sleep disturbance.    Physical Exam: Filed Vitals:   10/02/13 1526  BP: 148/78  Pulse: 109   The general appearance reveals a well-developed thin petite woman in no distress.The head and neck exam reveals pupils equal and reactive.  Extraocular movements are full.  There is no scleral icterus.  The mouth and pharynx are normal.  The neck is supple.  The carotids reveal soft right carotid bruit.  The jugular venous pressure is normal.  The  thyroid is not enlarged.  There is no lymphadenopathy.  The chest is clear to percussion and  auscultation.  There are no rales or rhonchi.  Expansion of the chest is symmetrical.  The precordium is quiet.  The first heart sound is normal.  The second heart sound is physiologically split.  There is no murmur gallop rub or click.  There is no abnormal lift or heave.  The abdomen is soft and nontender.  The bowel sounds are normal.  The liver and spleen are not enlarged.  There are no abdominal masses.  There are no abdominal bruits.  Extremities reveal good pedal pulses.  There is no phlebitis or edema.  There is no cyanosis or clubbing.  Strength is normal and symmetrical in all extremities.  There is no lateralizing weakness.  There are no sensory deficits.  The skin is warm and dry.  There is no rash.  EKG shows sinus tachycardia at 105 per minute.  Ischemic changes to  Assessment / Plan: 1.  Hypercholesterolemia 2. benign hypertensive heart disease without heart failure. 3. prior breast cancer with mastectomy left breast 1989, no evidence of recurrence 4. Osteopenia 5. right carotid bruit  Plan: Continue same medication.  Recheck in 6 months for followup office visit and fasting lab work.

## 2013-10-02 NOTE — Patient Instructions (Addendum)
STOP YOUR NIACIN AND FISH OIL  Return for fasting labs May 11 after 7:30 am  Your physician wants you to follow-up in: 6 months with fasting labs (lp/bmet/hfpcbc)  You will receive a reminder letter in the mail two months in advance. If you don't receive a letter, please call our office to schedule the follow-up appointment.

## 2013-10-02 NOTE — Assessment & Plan Note (Signed)
She has not had any TIA or stroke symptoms.

## 2013-10-02 NOTE — Assessment & Plan Note (Signed)
Exercise tolerance is good.  She has not been experiencing any signs or symptoms of CHF.  No shortness of breath with ordinary activity.

## 2013-10-06 ENCOUNTER — Other Ambulatory Visit (INDEPENDENT_AMBULATORY_CARE_PROVIDER_SITE_OTHER): Payer: Medicare Other

## 2013-10-06 DIAGNOSIS — I10 Essential (primary) hypertension: Secondary | ICD-10-CM | POA: Diagnosis not present

## 2013-10-06 DIAGNOSIS — E7800 Pure hypercholesterolemia, unspecified: Secondary | ICD-10-CM

## 2013-10-06 LAB — CBC WITH DIFFERENTIAL/PLATELET
BASOS PCT: 0.3 % (ref 0.0–3.0)
Basophils Absolute: 0 10*3/uL (ref 0.0–0.1)
EOS PCT: 3.1 % (ref 0.0–5.0)
Eosinophils Absolute: 0.3 10*3/uL (ref 0.0–0.7)
HCT: 41.4 % (ref 36.0–46.0)
Hemoglobin: 13.8 g/dL (ref 12.0–15.0)
LYMPHS PCT: 28.6 % (ref 12.0–46.0)
Lymphs Abs: 2.4 10*3/uL (ref 0.7–4.0)
MCHC: 33.2 g/dL (ref 30.0–36.0)
MCV: 91.5 fl (ref 78.0–100.0)
MONOS PCT: 6.1 % (ref 3.0–12.0)
Monocytes Absolute: 0.5 10*3/uL (ref 0.1–1.0)
Neutro Abs: 5.1 10*3/uL (ref 1.4–7.7)
Neutrophils Relative %: 61.9 % (ref 43.0–77.0)
Platelets: 227 10*3/uL (ref 150.0–400.0)
RBC: 4.52 Mil/uL (ref 3.87–5.11)
RDW: 13.5 % (ref 11.5–15.5)
WBC: 8.2 10*3/uL (ref 4.0–10.5)

## 2013-10-06 LAB — LIPID PANEL
CHOL/HDL RATIO: 3
Cholesterol: 182 mg/dL (ref 0–200)
HDL: 58.2 mg/dL (ref >39.00)
LDL Cholesterol: 91 mg/dL (ref 0–99)
TRIGLYCERIDES: 166 mg/dL — AB (ref 0.0–149.0)
VLDL: 33.2 mg/dL (ref 0.0–40.0)

## 2013-10-06 LAB — HEPATIC FUNCTION PANEL
ALBUMIN: 4 g/dL (ref 3.5–5.2)
ALT: 20 U/L (ref 0–35)
AST: 22 U/L (ref 0–37)
Alkaline Phosphatase: 45 U/L (ref 39–117)
Bilirubin, Direct: 0.1 mg/dL (ref 0.0–0.3)
Total Bilirubin: 0.7 mg/dL (ref 0.2–1.2)
Total Protein: 7.1 g/dL (ref 6.0–8.3)

## 2013-10-06 LAB — BASIC METABOLIC PANEL
BUN: 15 mg/dL (ref 6–23)
CALCIUM: 9.5 mg/dL (ref 8.4–10.5)
CO2: 27 mEq/L (ref 19–32)
Chloride: 102 mEq/L (ref 96–112)
Creatinine, Ser: 0.9 mg/dL (ref 0.4–1.2)
GFR: 62 mL/min (ref >60.00)
Glucose, Bld: 93 mg/dL (ref 70–99)
Potassium: 4.1 mEq/L (ref 3.5–5.1)
Sodium: 138 mEq/L (ref 135–145)

## 2013-10-07 NOTE — Progress Notes (Signed)
Quick Note:  Please report to patient. The recent labs are stable. Continue same medication and careful diet. ______ 

## 2013-10-08 ENCOUNTER — Telehealth: Payer: Self-pay | Admitting: *Deleted

## 2013-10-08 NOTE — Telephone Encounter (Signed)
Message copied by Earvin Hansen on Wed Oct 08, 2013 10:59 AM ------      Message from: Darlin Coco      Created: Tue Oct 07, 2013  7:27 AM       Please report to patient.  The recent labs are stable. Continue same medication and careful diet. ------

## 2013-10-08 NOTE — Telephone Encounter (Signed)
Advised patient of lab results  

## 2013-12-14 ENCOUNTER — Other Ambulatory Visit: Payer: Self-pay | Admitting: Cardiology

## 2013-12-14 DIAGNOSIS — M810 Age-related osteoporosis without current pathological fracture: Secondary | ICD-10-CM

## 2014-01-21 ENCOUNTER — Other Ambulatory Visit: Payer: Self-pay | Admitting: Cardiology

## 2014-01-21 DIAGNOSIS — F419 Anxiety disorder, unspecified: Secondary | ICD-10-CM

## 2014-02-16 ENCOUNTER — Telehealth: Payer: Self-pay | Admitting: Cardiology

## 2014-02-16 NOTE — Telephone Encounter (Signed)
New message           Pt would like for melinda to give her a call back to discuss some concerns she has

## 2014-02-16 NOTE — Telephone Encounter (Signed)
Patient having right sided jaw pain and a click in her ear a few times a day. Pain has moved around in the jaw area so she thinks may not be related to her teeth, but pain worse when she presses on them. Discussed with  Dr. Mare Ferrari and her recommended ENT or dentist. Advised patient would call ENT in am to try to get an appointment but to call dentist and see about getting appointment tomorrow. Patient wanted to want and see ENT before going to the dentist. Did explain probably would not be able to get same day ENT appointment but would call her tomorrow, if worse go to Urgent Care.

## 2014-02-17 NOTE — Telephone Encounter (Signed)
Follow up  Pt reports she will go to the dentist first. Pt requests a call back to discuss further//sr

## 2014-02-17 NOTE — Telephone Encounter (Signed)
Had faxed information to Dr Berle Mull office prior to patient calling today. Did speak with patient, she declined appointment with Dr Ernesto Rutherford secondary to tooth abscess. Very appreciative of [place] sending information to his office but stated felt it was related to tooth.

## 2014-03-20 DIAGNOSIS — H43811 Vitreous degeneration, right eye: Secondary | ICD-10-CM | POA: Diagnosis not present

## 2014-03-20 DIAGNOSIS — H26492 Other secondary cataract, left eye: Secondary | ICD-10-CM | POA: Diagnosis not present

## 2014-03-20 DIAGNOSIS — Z961 Presence of intraocular lens: Secondary | ICD-10-CM | POA: Diagnosis not present

## 2014-03-20 DIAGNOSIS — H02831 Dermatochalasis of right upper eyelid: Secondary | ICD-10-CM | POA: Diagnosis not present

## 2014-03-28 ENCOUNTER — Other Ambulatory Visit: Payer: Self-pay | Admitting: Cardiology

## 2014-03-31 ENCOUNTER — Other Ambulatory Visit (INDEPENDENT_AMBULATORY_CARE_PROVIDER_SITE_OTHER): Payer: Medicare Other

## 2014-03-31 DIAGNOSIS — E7800 Pure hypercholesterolemia, unspecified: Secondary | ICD-10-CM

## 2014-03-31 DIAGNOSIS — I10 Essential (primary) hypertension: Secondary | ICD-10-CM

## 2014-03-31 LAB — CBC WITH DIFFERENTIAL/PLATELET
Basophils Absolute: 0 10*3/uL (ref 0.0–0.1)
Basophils Relative: 0.6 % (ref 0.0–3.0)
Eosinophils Absolute: 0.3 10*3/uL (ref 0.0–0.7)
Eosinophils Relative: 3.8 % (ref 0.0–5.0)
HEMATOCRIT: 42.1 % (ref 36.0–46.0)
HEMOGLOBIN: 13.8 g/dL (ref 12.0–15.0)
LYMPHS ABS: 2.3 10*3/uL (ref 0.7–4.0)
Lymphocytes Relative: 30 % (ref 12.0–46.0)
MCHC: 32.9 g/dL (ref 30.0–36.0)
MCV: 89.3 fl (ref 78.0–100.0)
MONOS PCT: 6.3 % (ref 3.0–12.0)
Monocytes Absolute: 0.5 10*3/uL (ref 0.1–1.0)
NEUTROS ABS: 4.6 10*3/uL (ref 1.4–7.7)
Neutrophils Relative %: 59.3 % (ref 43.0–77.0)
Platelets: 233 10*3/uL (ref 150.0–400.0)
RBC: 4.72 Mil/uL (ref 3.87–5.11)
RDW: 13.5 % (ref 11.5–15.5)
WBC: 7.8 10*3/uL (ref 4.0–10.5)

## 2014-03-31 LAB — HEPATIC FUNCTION PANEL
ALT: 23 U/L (ref 0–35)
AST: 23 U/L (ref 0–37)
Albumin: 3.5 g/dL (ref 3.5–5.2)
Alkaline Phosphatase: 46 U/L (ref 39–117)
BILIRUBIN DIRECT: 0.1 mg/dL (ref 0.0–0.3)
BILIRUBIN TOTAL: 0.4 mg/dL (ref 0.2–1.2)
Total Protein: 6.8 g/dL (ref 6.0–8.3)

## 2014-03-31 LAB — BASIC METABOLIC PANEL
BUN: 11 mg/dL (ref 6–23)
CALCIUM: 9.5 mg/dL (ref 8.4–10.5)
CO2: 25 meq/L (ref 19–32)
Chloride: 105 mEq/L (ref 96–112)
Creatinine, Ser: 0.9 mg/dL (ref 0.4–1.2)
GFR: 62.71 mL/min (ref >60.00)
Glucose, Bld: 88 mg/dL (ref 70–99)
Potassium: 4.5 mEq/L (ref 3.5–5.1)
SODIUM: 139 meq/L (ref 135–145)

## 2014-03-31 LAB — LIPID PANEL
Cholesterol: 195 mg/dL (ref 0–200)
HDL: 62.6 mg/dL (ref >39.00)
NonHDL: 132.4
Total CHOL/HDL Ratio: 3
Triglycerides: 206 mg/dL — ABNORMAL HIGH (ref 0.0–149.0)
VLDL: 41.2 mg/dL — AB (ref 0.0–40.0)

## 2014-03-31 LAB — LDL CHOLESTEROL, DIRECT: Direct LDL: 102.8 mg/dL

## 2014-03-31 NOTE — Progress Notes (Signed)
Quick Note:  Please make copy of labs for patient visit. ______ 

## 2014-04-01 ENCOUNTER — Ambulatory Visit (INDEPENDENT_AMBULATORY_CARE_PROVIDER_SITE_OTHER): Payer: Medicare Other | Admitting: Cardiology

## 2014-04-01 VITALS — BP 140/76 | HR 112 | Ht 59.0 in | Wt 100.0 lb

## 2014-04-01 DIAGNOSIS — R0989 Other specified symptoms and signs involving the circulatory and respiratory systems: Secondary | ICD-10-CM | POA: Diagnosis not present

## 2014-04-01 DIAGNOSIS — E7800 Pure hypercholesterolemia, unspecified: Secondary | ICD-10-CM

## 2014-04-01 DIAGNOSIS — I119 Hypertensive heart disease without heart failure: Secondary | ICD-10-CM

## 2014-04-01 NOTE — Assessment & Plan Note (Signed)
The patient has a history of high blood pressure.  She has a history of whitecoat syndrome.  Blood pressure today is her than usual.  This may reflect the fact that she has not been getting as much exercise.  Her weight is unchanged

## 2014-04-01 NOTE — Patient Instructions (Signed)
Your physician recommends that you continue on your current medications as directed. Please refer to the Current Medication list given to you today.  Your physician wants you to follow-up in: 6 months with fasting labs (lp/bmet/hfp) AND EKG  You will receive a reminder letter in the mail two months in advance. If you don't receive a letter, please call our office to schedule the follow-up appointment.  

## 2014-04-01 NOTE — Assessment & Plan Note (Signed)
Her soft right carotid bruit is unchanged.  She has not had any TIA symptoms.

## 2014-04-01 NOTE — Assessment & Plan Note (Signed)
The patient is tolerating Lipitor 10 mg daily.  No myalgias.

## 2014-04-01 NOTE — Progress Notes (Signed)
[name] Date of Birth:  [birth year] [place] [address] [place], [place]  [address] [phone]        Fax   [phone]   History of Present Illness: This pleasant 80 year old woman is seen for a scheduled followup office visit. She has a history of high blood pressure and hypercholesterolemia. She has a past history of breast cancer left breast in 1986 and in 1989. She had a mastectomy in 1989. There has been no evidence of recurrence. She has a history of osteopenia.  After her last visit 6 months ago we did a carotid Doppler because of a soft right carotid bruit. The Doppler was done on 10/17/11 and showed no evidence of obstruction. Since last visit she is on a lower dose of Lipitor just 10 mg a day.  She has had no new cardiac symptoms.  She denies chest pain.since last visit she has not been walking as much and her lipids are slightly higher today.   Current Outpatient Prescriptions  Medication Sig Dispense Refill  . ALPRAZolam (XANAX) 0.25 MG tablet TAKE 1 TABLET BY MOUTH EVERY DAY AS NEEDED 30 tablet 3  . Ascorbic Acid (VITAMIN C) 500 MG tablet Take 500 mg by mouth daily.      Marland Kitchen aspirin 325 MG tablet Take 325 mg by mouth daily.      Marland Kitchen atorvastatin (LIPITOR) 10 MG tablet TAKE 1 TABLET BY MOUTH DAILY AT 6 PM 90 tablet 0  . Cholecalciferol (VITAMIN D) 1000 UNITS capsule Take 1,000 Units by mouth daily.      . Coenzyme Q10 (CO Q-10) 200 MG CAPS Take by mouth.    . Ibuprofen-Diphenhydramine Cit (ADVIL PM PO) Take by mouth at bedtime as needed.      . Multiple Vitamin (MULTIVITAMIN) tablet Take 1 tablet by mouth daily.      Marland Kitchen omeprazole (PRILOSEC) 20 MG capsule Take 1 capsule (20 mg total) by mouth daily. 90 capsule 3  . Psyllium (METAMUCIL PO) Take by mouth.      . raloxifene (EVISTA) 60 MG tablet TAKE 1 TABLET BY MOUTH DAILY 30 tablet 5  . vitamin B-12 (CYANOCOBALAMIN) 500 MCG tablet Take 500 mcg by mouth daily.      No current facility-administered  medications for this visit.    No Known Allergies  Patient Active Problem List   Diagnosis Date Noted  . Benign hypertensive heart disease without heart failure 09/01/2010    Priority: High  . Hypercholesterolemia 09/01/2010    Priority: Medium  . Malaise and fatigue 04/15/2012  . Right carotid bruit 10/05/2011  . Hiatal hernia 04/05/2011  . History of breast cancer 09/01/2010  . Osteopenia 09/01/2010    History  Smoking status  . Never Smoker   Smokeless tobacco  . Never Used    History  Alcohol Use: Not on file    Family History  Problem Relation Age of Onset  . Arthritis Father   . Stroke Father   . Hypertension Mother     Review of Systems: Constitutional: no fever chills diaphoresis or fatigue or change in weight.  Head and neck: no hearing loss, no epistaxis, no photophobia or visual disturbance. Respiratory: No cough, shortness of breath or wheezing. Cardiovascular: No chest pain peripheral edema, palpitations. Gastrointestinal: No abdominal distention, no abdominal pain, no change in bowel habits hematochezia or melena. Genitourinary: No dysuria, no frequency, no urgency, no nocturia. Musculoskeletal:No arthralgias, no back pain, no gait disturbance or myalgias.  Neurological: No dizziness, no headaches, no numbness, no seizures, no syncope, no weakness, no tremors. Hematologic: No lymphadenopathy, no easy bruising. Psychiatric: No confusion, no hallucinations, no sleep disturbance.    Physical Exam: Filed Vitals:   04/01/14 1404  BP: 160/68  Pulse: 112   The general appearance reveals a well-developed thin petite woman in no distress.The head and neck exam reveals pupils equal and reactive.  Extraocular movements are full.  There is no scleral icterus.  The mouth and pharynx are normal.  The neck is supple.  The carotids reveal soft right carotid bruit.  The jugular venous pressure is normal.  The  thyroid is not enlarged.  There is no lymphadenopathy.   The chest is clear to percussion and auscultation.  There are no rales or rhonchi.  Expansion of the chest is symmetrical.  The precordium is quiet.  The first heart sound is normal.  The second heart sound is physiologically split.  There is no murmur gallop rub or click.  There is no abnormal lift or heave.  The abdomen is soft and nontender.  The bowel sounds are normal.  The liver and spleen are not enlarged.  There are no abdominal masses.  There are no abdominal bruits.  Extremities reveal good pedal pulses.  There is no phlebitis or edema.  There is no cyanosis or clubbing.  Strength is normal and symmetrical in all extremities.  There is no lateralizing weakness.  There are no sensory deficits.  The skin is warm and dry.  There is no rash.    Assessment / Plan: 1.  Hypercholesterolemia 2. benign hypertensive heart disease without heart failure. 3. prior breast cancer with mastectomy left breast 1989, no evidence of recurrence 4. Osteopenia 5. right carotid bruit 6. whitecoat syndrome  Plan: Continue same medication.  Recheck in 6 months for followup office visit and fasting lab work.And EKG

## 2014-04-29 ENCOUNTER — Telehealth: Payer: Self-pay | Admitting: *Deleted

## 2014-04-29 NOTE — Telephone Encounter (Signed)
New Message  Left vm for pt concerning flu shot 

## 2014-05-06 DIAGNOSIS — Z23 Encounter for immunization: Secondary | ICD-10-CM | POA: Diagnosis not present

## 2014-06-20 ENCOUNTER — Other Ambulatory Visit: Payer: Self-pay | Admitting: Cardiology

## 2014-06-20 DIAGNOSIS — M8580 Other specified disorders of bone density and structure, unspecified site: Secondary | ICD-10-CM

## 2014-07-03 ENCOUNTER — Other Ambulatory Visit: Payer: Self-pay | Admitting: Cardiology

## 2014-07-22 ENCOUNTER — Other Ambulatory Visit: Payer: Self-pay | Admitting: Cardiology

## 2014-07-22 DIAGNOSIS — M810 Age-related osteoporosis without current pathological fracture: Secondary | ICD-10-CM

## 2014-07-28 ENCOUNTER — Other Ambulatory Visit: Payer: Self-pay | Admitting: Cardiology

## 2014-07-28 DIAGNOSIS — F419 Anxiety disorder, unspecified: Secondary | ICD-10-CM

## 2014-08-18 ENCOUNTER — Telehealth: Payer: Self-pay

## 2014-08-18 NOTE — Telephone Encounter (Signed)
Okay for her to get a 90 day prescription.

## 2014-08-19 ENCOUNTER — Other Ambulatory Visit: Payer: Self-pay

## 2014-08-19 DIAGNOSIS — M810 Age-related osteoporosis without current pathological fracture: Secondary | ICD-10-CM

## 2014-08-19 MED ORDER — RALOXIFENE HCL 60 MG PO TABS: 60.0000 mg | ORAL_TABLET | Freq: Every day | ORAL | Status: DC

## 2014-09-07 ENCOUNTER — Telehealth: Payer: Self-pay | Admitting: *Deleted

## 2014-09-07 NOTE — Telephone Encounter (Signed)
Okay to send Rx for breast prosthesis.

## 2014-09-07 NOTE — Telephone Encounter (Signed)
RX written and will be given to Dr Mare Ferrari to be signed 09/08/2014

## 2014-09-07 NOTE — Telephone Encounter (Signed)
Patient called and stated that she needs a new rx for a breast prosthesis faxed to [facility] at [phone]. Please advise. Thanks, MI

## 2014-09-09 ENCOUNTER — Telehealth: Payer: Self-pay | Admitting: Cardiology

## 2014-09-09 NOTE — Telephone Encounter (Signed)
Calling to see if the pres for breast prosthesis has been sent to [facility].  Found Rx on [name]'s cart and has been signed by Dr. Mare Ferrari and will fax to [facility] at [phone].  Notified pt I will fax Rx today.

## 2014-09-09 NOTE — Telephone Encounter (Signed)
New message     Pt need a presc for a breast prosthesis faxed to [phone] to [facility].

## 2014-09-11 NOTE — Telephone Encounter (Signed)
Rx faxed

## 2014-09-18 ENCOUNTER — Other Ambulatory Visit: Payer: Self-pay | Admitting: Cardiology

## 2014-09-18 DIAGNOSIS — M8580 Other specified disorders of bone density and structure, unspecified site: Secondary | ICD-10-CM

## 2014-09-18 NOTE — Telephone Encounter (Signed)
Okay to refill evista.

## 2014-09-21 ENCOUNTER — Telehealth: Payer: Self-pay | Admitting: Cardiology

## 2014-09-21 NOTE — Telephone Encounter (Signed)
New message      When did pt get her last pneumonia vaccine?  She was going to get one at [facility] but she told them she may have already gotten one here.

## 2014-09-21 NOTE — Telephone Encounter (Signed)
Spoke with [facility] and not sure when last pneumonia vaccine given [facility] will give Prevnar 45  Requested chart to review for pneumo and shingles vac

## 2014-09-28 ENCOUNTER — Other Ambulatory Visit (INDEPENDENT_AMBULATORY_CARE_PROVIDER_SITE_OTHER): Payer: Medicare Other | Admitting: *Deleted

## 2014-09-28 DIAGNOSIS — E7800 Pure hypercholesterolemia, unspecified: Secondary | ICD-10-CM

## 2014-09-28 DIAGNOSIS — I119 Hypertensive heart disease without heart failure: Secondary | ICD-10-CM | POA: Diagnosis not present

## 2014-09-28 LAB — LIPID PANEL
Cholesterol: 182 mg/dL (ref 0–200)
HDL: 68.5 mg/dL (ref >39.00)
LDL CALC: 85 mg/dL (ref 0–99)
NonHDL: 113.5
TRIGLYCERIDES: 145 mg/dL (ref 0.0–149.0)
Total CHOL/HDL Ratio: 3
VLDL: 29 mg/dL (ref 0.0–40.0)

## 2014-09-28 LAB — HEPATIC FUNCTION PANEL
ALBUMIN: 3.9 g/dL (ref 3.5–5.2)
ALK PHOS: 47 U/L (ref 39–117)
ALT: 20 U/L (ref 0–35)
AST: 22 U/L (ref 0–37)
Bilirubin, Direct: 0.1 mg/dL (ref 0.0–0.3)
Total Bilirubin: 0.4 mg/dL (ref 0.2–1.2)
Total Protein: 7 g/dL (ref 6.0–8.3)

## 2014-09-28 LAB — BASIC METABOLIC PANEL
BUN: 12 mg/dL (ref 6–23)
CHLORIDE: 103 meq/L (ref 96–112)
CO2: 28 mEq/L (ref 19–32)
CREATININE: 0.95 mg/dL (ref 0.40–1.20)
Calcium: 9.6 mg/dL (ref 8.4–10.5)
GFR: 59.6 mL/min — ABNORMAL LOW (ref >60.00)
GLUCOSE: 104 mg/dL — AB (ref 70–99)
Potassium: 4 mEq/L (ref 3.5–5.1)
Sodium: 138 mEq/L (ref 135–145)

## 2014-09-28 NOTE — Progress Notes (Signed)
Quick Note:  Please make copy of labs for patient visit. ______ 

## 2014-09-29 ENCOUNTER — Ambulatory Visit (INDEPENDENT_AMBULATORY_CARE_PROVIDER_SITE_OTHER): Payer: Medicare Other | Admitting: Cardiology

## 2014-09-29 ENCOUNTER — Encounter: Payer: Self-pay | Admitting: Cardiology

## 2014-09-29 VITALS — BP 140/70 | HR 98 | Ht 59.0 in | Wt 99.2 lb

## 2014-09-29 DIAGNOSIS — I119 Hypertensive heart disease without heart failure: Secondary | ICD-10-CM | POA: Diagnosis not present

## 2014-09-29 DIAGNOSIS — E7800 Pure hypercholesterolemia, unspecified: Secondary | ICD-10-CM

## 2014-09-29 NOTE — Progress Notes (Signed)
Cardiology Office Note   Date:  09/29/2014   ID:  [name], DOB 02/11/1929, MRN [id number]  PCP:  Warren Danes, MD  Cardiologist: Darlin Coco MD  No chief complaint on file.     History of Present Illness: [name] is a 81 y.o. female who presents for a six-month follow-up  This pleasant 81 year old woman is seen for a scheduled followup office visit. She has a history of high blood pressure and hypercholesterolemia. She has a past history of breast cancer left breast in 1986 and in 1989. She had a mastectomy in 1989. There has been no evidence of recurrence. She has a history of osteopenia. She has a known soft right carotid bruit.  No TIA symptoms.  The Doppler was done on 10/17/11 and again on 11/04/12 showed no evidence of obstruction. Since last visit she is on a lower dose of Lipitor just 10 mg a day. She has had no new cardiac symptoms. She denies chest pain.she has been doing some walking.  Her lipids are improved.  Her weight is down 1 pound.  She does not want to lose any more weight. Past Medical History  Diagnosis Date  . Hypercholesteremia   . Breast cancer   . Hypertension   . Osteopenia   . Osteoporosis   . Hiatal hernia   . Diverticulosis     Past Surgical History  Procedure Laterality Date  . Mastectomy  1999    LEFT  . Abdominal hysterectomy       Current Outpatient Prescriptions  Medication Sig Dispense Refill  . ALPRAZolam (XANAX) 0.25 MG tablet Take 0.25 mg by mouth at bedtime as needed for anxiety (take 1/2 tablet as needed for sleep).    . Ascorbic Acid (VITAMIN C) 500 MG tablet Take 500 mg by mouth daily.      Marland Kitchen aspirin 325 MG tablet Take 325 mg by mouth daily.      Marland Kitchen atorvastatin (LIPITOR) 10 MG tablet TAKE 1 TABLET BY MOUTH DAILY AT 6PM 90 tablet 0  . Cholecalciferol (VITAMIN D) 1000 UNITS capsule Take 1,000 Units by mouth daily.      . Coenzyme Q10 (CO Q-10) 200 MG CAPS Take 1 capsule by mouth daily.     .  Ibuprofen-Diphenhydramine Cit (ADVIL PM PO) Take 1 tablet by mouth at bedtime as needed (for arthritis).     . Multiple Vitamin (MULTIVITAMIN) tablet Take 1 tablet by mouth daily.      . niacin (NIASPAN) 500 MG CR tablet Take 500 mg by mouth at bedtime.    Marland Kitchen omeprazole (PRILOSEC) 20 MG capsule Take 1 capsule (20 mg total) by mouth daily. 90 capsule 3  . Psyllium (METAMUCIL PO) Take 1 Dose by mouth daily as needed (for constipation).     . raloxifene (EVISTA) 60 MG tablet Take 1 tablet (60 mg total) by mouth daily. 90 tablet 1  . vitamin B-12 (CYANOCOBALAMIN) 500 MCG tablet Take 500 mcg by mouth daily.      No current facility-administered medications for this visit.    Allergies:   Review of patient's allergies indicates no known allergies.    Social History:  The patient  reports that she has never smoked. She has never used smokeless tobacco.   Family History:  The patient's family history includes Arthritis in her father; Hypertension in her mother; Stroke in her father.    ROS:  Please see the history of present illness.   Otherwise, review of systems  are positive for none.   All other systems are reviewed and negative.    PHYSICAL EXAM: VS:  BP 140/70 mmHg  Pulse 98  Ht 4\' 11"  (1.499 m)  Wt 99 lb 3.2 oz (44.997 kg)  BMI 20.03 kg/m2 , BMI Body mass index is 20.03 kg/(m^2). GEN: Well nourished, well developed, in no acute distress HEENT: normal Neck: no JVD, there is a soft right carotid bruit Cardiac: RRR; no murmurs, rubs, or gallops,no edema  Respiratory:  clear to auscultation bilaterally, normal work of breathing GI: soft, nontender, nondistended, + BS MS: no deformity or atrophy Skin: warm and dry, no rash Neuro:  Strength and sensation are intact Psych: euthymic mood, full affect   EKG:  EKG is ordered today. The ekg ordered today demonstrates normal sinus rhythm and nonspecific ST-T wave changes   Recent Labs: 03/31/2014: Hemoglobin 13.8; Platelets  233.0 09/28/2014: ALT 20; BUN 12; Creatinine 0.95; Potassium 4.0; Sodium 138    Lipid Panel    Component Value Date/Time   CHOL 182 09/28/2014 0928   TRIG 145.0 09/28/2014 0928   HDL 68.50 09/28/2014 0928   CHOLHDL 3 09/28/2014 0928   VLDL 29.0 09/28/2014 0928   LDLCALC 85 09/28/2014 0928   LDLDIRECT 102.8 03/31/2014 0922      Wt Readings from Last 3 Encounters:  09/29/14 99 lb 3.2 oz (44.997 kg)  04/01/14 100 lb (45.36 kg)  10/02/13 100 lb (45.36 kg)         ASSESSMENT AND PLAN:  1. Hypercholesterolemia 2. benign hypertensive heart disease without heart failure. 3. prior breast cancer with mastectomy left breast 1989, no evidence of recurrence 4. Osteopenia 5. right carotid bruit 6. whitecoat syndrome  Plan: Continue same medication. Recheck in 6 months for followup office visit and fasting lab work.   Current medicines are reviewed at length with the patient today.  The patient does not have concerns regarding medicines.  The following changes have been made:  no change  Labs/ tests ordered today include:   Orders Placed This Encounter  Procedures  . Lipid panel  . Hepatic function panel  . Basic metabolic panel  . CBC with Differential/Platelet  . EKG 12-Lead     Disposition: Continue current medication.  Recheck in 6 months for office visit CBC lipid panel hepatic function panel and basal metabolic panel  Signed, Darlin Coco MD 09/29/2014 5:58 PM    [facility] [place], [place], [place]  [address] Phone: [phone]; Fax: [phone]

## 2014-09-29 NOTE — Patient Instructions (Signed)
Medication Instructions:  Your physician recommends that you continue on your current medications as directed. Please refer to the Current Medication list given to you today.  Labwork: none  Testing/Procedures: none  Follow-Up: Your physician wants you to follow-up in: 6 months with fasting labs (lp/bmet/hfp/cbc)  You will receive a reminder letter in the mail two months in advance. If you don't receive a letter, please call our office to schedule the follow-up appointment.

## 2014-10-02 ENCOUNTER — Other Ambulatory Visit: Payer: Self-pay | Admitting: Cardiology

## 2014-10-02 DIAGNOSIS — Z23 Encounter for immunization: Secondary | ICD-10-CM | POA: Diagnosis not present

## 2014-10-18 ENCOUNTER — Other Ambulatory Visit: Payer: Self-pay | Admitting: Cardiology

## 2014-12-15 ENCOUNTER — Other Ambulatory Visit: Payer: Self-pay

## 2014-12-15 MED ORDER — OMEPRAZOLE 20 MG PO CPDR: 20.0000 mg | DELAYED_RELEASE_CAPSULE | Freq: Every day | ORAL | Status: DC

## 2014-12-17 ENCOUNTER — Other Ambulatory Visit: Payer: Self-pay

## 2014-12-17 MED ORDER — OMEPRAZOLE 20 MG PO CPDR: 20.0000 mg | DELAYED_RELEASE_CAPSULE | Freq: Every day | ORAL | Status: DC

## 2014-12-22 ENCOUNTER — Other Ambulatory Visit: Payer: Self-pay | Admitting: Cardiology

## 2014-12-25 NOTE — Telephone Encounter (Signed)
No record of pneumonia vaccine given in paper chart

## 2015-03-03 ENCOUNTER — Other Ambulatory Visit: Payer: Self-pay | Admitting: Cardiology

## 2015-03-03 DIAGNOSIS — F419 Anxiety disorder, unspecified: Secondary | ICD-10-CM

## 2015-03-03 NOTE — Telephone Encounter (Signed)
OK to refill

## 2015-03-10 ENCOUNTER — Other Ambulatory Visit: Payer: Self-pay

## 2015-03-10 MED ORDER — OMEPRAZOLE 20 MG PO CPDR: 20.0000 mg | DELAYED_RELEASE_CAPSULE | Freq: Every day | ORAL | Status: DC

## 2015-03-24 DIAGNOSIS — H43813 Vitreous degeneration, bilateral: Secondary | ICD-10-CM | POA: Diagnosis not present

## 2015-03-24 DIAGNOSIS — H04123 Dry eye syndrome of bilateral lacrimal glands: Secondary | ICD-10-CM | POA: Diagnosis not present

## 2015-03-24 DIAGNOSIS — H52202 Unspecified astigmatism, left eye: Secondary | ICD-10-CM | POA: Diagnosis not present

## 2015-03-24 DIAGNOSIS — Z961 Presence of intraocular lens: Secondary | ICD-10-CM | POA: Diagnosis not present

## 2015-03-25 DIAGNOSIS — Z23 Encounter for immunization: Secondary | ICD-10-CM | POA: Diagnosis not present

## 2015-04-03 ENCOUNTER — Other Ambulatory Visit: Payer: Self-pay | Admitting: Cardiology

## 2015-04-07 ENCOUNTER — Other Ambulatory Visit (INDEPENDENT_AMBULATORY_CARE_PROVIDER_SITE_OTHER): Payer: Medicare Other | Admitting: *Deleted

## 2015-04-07 DIAGNOSIS — I119 Hypertensive heart disease without heart failure: Secondary | ICD-10-CM | POA: Diagnosis not present

## 2015-04-07 DIAGNOSIS — R5383 Other fatigue: Secondary | ICD-10-CM | POA: Diagnosis not present

## 2015-04-07 DIAGNOSIS — R5381 Other malaise: Secondary | ICD-10-CM | POA: Diagnosis not present

## 2015-04-07 DIAGNOSIS — E7800 Pure hypercholesterolemia, unspecified: Secondary | ICD-10-CM | POA: Diagnosis not present

## 2015-04-07 LAB — BASIC METABOLIC PANEL
BUN: 11 mg/dL (ref 7–25)
CHLORIDE: 104 mmol/L (ref 98–110)
CO2: 26 mmol/L (ref 20–31)
CREATININE: 0.9 mg/dL — AB (ref 0.60–0.88)
Calcium: 9.5 mg/dL (ref 8.6–10.4)
Glucose, Bld: 101 mg/dL — ABNORMAL HIGH (ref 65–99)
Potassium: 4.1 mmol/L (ref 3.5–5.3)
Sodium: 138 mmol/L (ref 135–146)

## 2015-04-07 LAB — HEPATIC FUNCTION PANEL
ALT: 17 U/L (ref 6–29)
AST: 19 U/L (ref 10–35)
Albumin: 4 g/dL (ref 3.6–5.1)
Alkaline Phosphatase: 48 U/L (ref 33–130)
BILIRUBIN DIRECT: 0.1 mg/dL (ref <=0.2)
Indirect Bilirubin: 0.3 mg/dL (ref 0.2–1.2)
TOTAL PROTEIN: 6.8 g/dL (ref 6.1–8.1)
Total Bilirubin: 0.4 mg/dL (ref 0.2–1.2)

## 2015-04-07 LAB — CBC WITH DIFFERENTIAL/PLATELET
Basophils Absolute: 0.1 10*3/uL (ref 0.0–0.1)
Basophils Relative: 1 % (ref 0–1)
EOS ABS: 0.4 10*3/uL (ref 0.0–0.7)
EOS PCT: 5 % (ref 0–5)
HCT: 40.2 % (ref 36.0–46.0)
Hemoglobin: 13.7 g/dL (ref 12.0–15.0)
LYMPHS ABS: 2.8 10*3/uL (ref 0.7–4.0)
Lymphocytes Relative: 34 % (ref 12–46)
MCH: 30 pg (ref 26.0–34.0)
MCHC: 34.1 g/dL (ref 30.0–36.0)
MCV: 88 fL (ref 78.0–100.0)
MONO ABS: 0.7 10*3/uL (ref 0.1–1.0)
MONOS PCT: 8 % (ref 3–12)
MPV: 10 fL (ref 8.6–12.4)
Neutro Abs: 4.3 10*3/uL (ref 1.7–7.7)
Neutrophils Relative %: 52 % (ref 43–77)
PLATELETS: 254 10*3/uL (ref 150–400)
RBC: 4.57 MIL/uL (ref 3.87–5.11)
RDW: 13.6 % (ref 11.5–15.5)
WBC: 8.3 10*3/uL (ref 4.0–10.5)

## 2015-04-07 LAB — LIPID PANEL
CHOLESTEROL: 169 mg/dL (ref 125–200)
HDL: 65 mg/dL (ref >=46)
LDL Cholesterol: 78 mg/dL (ref <130)
TRIGLYCERIDES: 128 mg/dL (ref <150)
Total CHOL/HDL Ratio: 2.6 Ratio (ref <=5.0)
VLDL: 26 mg/dL (ref <30)

## 2015-04-07 NOTE — Addendum Note (Signed)
Addended by: Eulis Foster on: 04/07/2015 09:37 AM   Modules accepted: Orders

## 2015-04-08 ENCOUNTER — Ambulatory Visit (INDEPENDENT_AMBULATORY_CARE_PROVIDER_SITE_OTHER): Payer: Medicare Other | Admitting: Cardiology

## 2015-04-08 VITALS — BP 150/68 | HR 109 | Ht 59.0 in | Wt 100.0 lb

## 2015-04-08 DIAGNOSIS — R0989 Other specified symptoms and signs involving the circulatory and respiratory systems: Secondary | ICD-10-CM | POA: Diagnosis not present

## 2015-04-08 DIAGNOSIS — E7800 Pure hypercholesterolemia, unspecified: Secondary | ICD-10-CM

## 2015-04-08 DIAGNOSIS — I358 Other nonrheumatic aortic valve disorders: Secondary | ICD-10-CM | POA: Insufficient documentation

## 2015-04-08 DIAGNOSIS — I119 Hypertensive heart disease without heart failure: Secondary | ICD-10-CM

## 2015-04-08 MED ORDER — OMEPRAZOLE 20 MG PO CPDR: 20.0000 mg | DELAYED_RELEASE_CAPSULE | Freq: Every day | ORAL | Status: DC

## 2015-04-08 MED ORDER — LOSARTAN POTASSIUM 25 MG PO TABS: 25.0000 mg | ORAL_TABLET | Freq: Every day | ORAL | Status: DC

## 2015-04-08 NOTE — Patient Instructions (Addendum)
Medication Instructions: STOP NIACIN   START LOSARTAN 25 MG DAILY   Labwork: BMET IN 1 WEEK   Testing/Procedures: Your physician has requested that you have an echocardiogram. Echocardiography is a painless test that uses sound waves to create images of your heart. It provides your doctor with information about the size and shape of your heart and how well your heart's chambers and valves are working. This procedure takes approximately one hour. There are no restrictions for this procedure.  Follow-Up: Your physician wants you to follow-up in: 6 months with fasting labs (lp/bmet/hfp) WITH DR Acie Fredrickson  You will receive a reminder letter in the mail two months in advance. If you don't receive a letter, please call our office to schedule the follow-up appointment.  If you need a refill on your cardiac medications before your next appointment, please call your pharmacy.

## 2015-04-08 NOTE — Progress Notes (Signed)
Quick Note:  Please make copy of labs for patient visit. ______ 

## 2015-04-08 NOTE — Progress Notes (Signed)
Cardiology Office Note   Date:  04/08/2015   ID:  [name], DOB [birth year], MRN [id number]  PCP:  Warren Danes, MD  Cardiologist: Darlin Coco MD  Chief Complaint  Patient presents with  . Hypertension      History of Present Illness: [name] is a 81 y.o. female who presents for  Six-month visit  This pleasant 81 year old woman is seen for a scheduled followup office visit. She has a history of high blood pressure and hypercholesterolemia. She has a past history of breast cancer left breast in 1986 and in 1989. She had a mastectomy in 1989. There has been no evidence of recurrence. She has a history of osteopenia. She has a known soft right carotid bruit. No TIA symptoms. The Doppler was done on 10/17/11 and again on 11/04/12 showed no evidence of obstruction. The degree of stenosis was less than 39% bilaterally. Since last visit she is on a lower dose of Lipitor just 10 mg a day. She has had no new cardiac symptoms. She denies chest pain.she has been doing some walking. Her lipids are stable. Her blood pressure is often running borderline high and she is concerned about that. She is not currently on any blood pressure medicines.  Past Medical History  Diagnosis Date  . Hypercholesteremia   . Breast cancer   . Hypertension   . Osteopenia   . Osteoporosis   . Hiatal hernia   . Diverticulosis     Past Surgical History  Procedure Laterality Date  . Mastectomy  1999    LEFT  . Abdominal hysterectomy       Current Outpatient Prescriptions  Medication Sig Dispense Refill  . ALPRAZolam (XANAX) 0.25 MG tablet Take 0.25 mg by mouth at bedtime as needed for anxiety (sleep).    . Ascorbic Acid (VITAMIN C) 500 MG tablet Take 500 mg by mouth daily.      Marland Kitchen aspirin 325 MG tablet Take 325 mg by mouth daily.      Marland Kitchen atorvastatin (LIPITOR) 10 MG tablet TAKE 1 TABLET BY MOUTH EVERY DAY 6 PM 90 tablet 1  . Cholecalciferol (VITAMIN D) 1000 UNITS capsule Take 1,000 Units  by mouth daily.      . Coenzyme Q10 (CO Q-10) 200 MG CAPS Take 1 capsule by mouth daily.     . Ibuprofen-Diphenhydramine Cit (ADVIL PM PO) Take 1 tablet by mouth at bedtime as needed (for arthritis).     . Multiple Vitamin (MULTIVITAMIN) tablet Take 1 tablet by mouth daily.      Marland Kitchen omeprazole (PRILOSEC) 20 MG capsule Take 1 capsule (20 mg total) by mouth daily. 90 capsule 3  . Psyllium (METAMUCIL PO) Take 1 Dose by mouth daily as needed (for constipation).     . raloxifene (EVISTA) 60 MG tablet TAKE 1 TABLET BY MOUTH ONCE DAILY 90 tablet 1  . vitamin B-12 (CYANOCOBALAMIN) 500 MCG tablet Take 500 mcg by mouth daily.     Marland Kitchen losartan (COZAAR) 25 MG tablet Take 1 tablet (25 mg total) by mouth daily. 30 tablet 5   No current facility-administered medications for this visit.    Allergies:   Review of patient's allergies indicates no known allergies.    Social History:  The patient  reports that she has never smoked. She has never used smokeless tobacco.   Family History:  The patient's family history includes Arthritis in her father; Hypertension in her mother; Stroke in her father.    ROS:  Please see the history of present illness.   Otherwise, review of systems are positive for none.   All other systems are reviewed and negative.    PHYSICAL EXAM: VS:  BP 150/68 mmHg  Pulse 109  Ht 4\' 11"  (1.499 m)  Wt 100 lb (45.36 kg)  BMI 20.19 kg/m2 , BMI Body mass index is 20.19 kg/(m^2). GEN: Well nourished, well developed, in no acute distress HEENT: normal Neck: no JVD, c there is a right carotid bruit,  Cardiac: RRR;  There is a grade 2/6 systolic ejection murmur at the aortic area. No diastolic murmur heard., rubs, or gallops,no edema  Respiratory:  clear to auscultation bilaterally, normal work of breathing GI: soft, nontender, nondistended, + BS MS: no deformity or atrophy Skin: warm and dry, no rash Neuro:  Strength and sensation are intact Psych: euthymic mood, full affect   EKG:   EKG is not ordered today.     Recent Labs: 04/07/2015: ALT 17; BUN 11; Creat 0.90*; Hemoglobin 13.7; Platelets 254; Potassium 4.1; Sodium 138    Lipid Panel    Component Value Date/Time   CHOL 169 04/07/2015 0937   TRIG 128 04/07/2015 0937   HDL 65 04/07/2015 0937   CHOLHDL 2.6 04/07/2015 0937   VLDL 26 04/07/2015 0937   LDLCALC 78 04/07/2015 0937   LDLDIRECT 102.8 03/31/2014 0922      Wt Readings from Last 3 Encounters:  04/08/15 100 lb (45.36 kg)  09/29/14 99 lb 3.2 oz (44.997 kg)  04/01/14 100 lb (45.36 kg)        ASSESSMENT AND PLAN:   1. Hypercholesterolemia 2. benign hypertensive heart disease without heart failure. 3. prior breast cancer with mastectomy left breast 1989, no evidence of recurrence 4. Osteopenia 5. right carotid bruit 6. whitecoat syndrome 7.  Systolic heart murmur at aortic area  Plan: Continue same medication. Recheck in 6 months for followup office visit and fasting lab work.   Current medicines are reviewed at length with the patient today.  The patient does not have concerns regarding medicines.  The following changes have been made:   Stop niacin. Evidence does not support its continued use. Start losartan 25 mg one daily for blood pressure. She will return in one week for a follow-up basal metabolic panel.  Labs/ tests ordered today include:   Orders Placed This Encounter  Procedures  . Basic metabolic panel  . ECHOCARDIOGRAM COMPLETE      disposition: we will get a two-dimensional echocardiogram to look at her aortic valve. She'll be rechecked in 6 months for office visit the panel hepatic function panel and basal metabolic panel.  Berna Spare MD 04/08/2015 6:49 PM    [facility] [place], [place],   [address] Phone: [phone]; Fax: [phone]

## 2015-04-13 ENCOUNTER — Other Ambulatory Visit (INDEPENDENT_AMBULATORY_CARE_PROVIDER_SITE_OTHER): Payer: Medicare Other | Admitting: *Deleted

## 2015-04-13 ENCOUNTER — Ambulatory Visit (HOSPITAL_COMMUNITY): Payer: Medicare Other | Attending: Cardiology

## 2015-04-13 ENCOUNTER — Other Ambulatory Visit: Payer: Self-pay

## 2015-04-13 DIAGNOSIS — I10 Essential (primary) hypertension: Secondary | ICD-10-CM | POA: Insufficient documentation

## 2015-04-13 DIAGNOSIS — I5189 Other ill-defined heart diseases: Secondary | ICD-10-CM | POA: Diagnosis not present

## 2015-04-13 DIAGNOSIS — I119 Hypertensive heart disease without heart failure: Secondary | ICD-10-CM

## 2015-04-13 DIAGNOSIS — E785 Hyperlipidemia, unspecified: Secondary | ICD-10-CM | POA: Insufficient documentation

## 2015-04-13 DIAGNOSIS — I340 Nonrheumatic mitral (valve) insufficiency: Secondary | ICD-10-CM | POA: Diagnosis not present

## 2015-04-13 DIAGNOSIS — I358 Other nonrheumatic aortic valve disorders: Secondary | ICD-10-CM

## 2015-04-13 DIAGNOSIS — R011 Cardiac murmur, unspecified: Secondary | ICD-10-CM | POA: Insufficient documentation

## 2015-04-13 DIAGNOSIS — I517 Cardiomegaly: Secondary | ICD-10-CM | POA: Diagnosis not present

## 2015-04-13 DIAGNOSIS — I071 Rheumatic tricuspid insufficiency: Secondary | ICD-10-CM | POA: Insufficient documentation

## 2015-04-13 LAB — BASIC METABOLIC PANEL
BUN: 14 mg/dL (ref 7–25)
CHLORIDE: 105 mmol/L (ref 98–110)
CO2: 25 mmol/L (ref 20–31)
Calcium: 9.1 mg/dL (ref 8.6–10.4)
Creat: 0.8 mg/dL (ref 0.60–0.88)
Glucose, Bld: 148 mg/dL — ABNORMAL HIGH (ref 65–99)
POTASSIUM: 3.7 mmol/L (ref 3.5–5.3)
SODIUM: 141 mmol/L (ref 135–146)

## 2015-04-14 NOTE — Progress Notes (Signed)
Quick Note:  Please report to patient. The recent labs are stable. Continue same medication and careful diet. The blood sugar is higher. Watch carbohydrates carefully. ______

## 2015-05-25 ENCOUNTER — Other Ambulatory Visit: Payer: Self-pay

## 2015-05-25 MED ORDER — OMEPRAZOLE 20 MG PO CPDR: 20.0000 mg | DELAYED_RELEASE_CAPSULE | Freq: Every day | ORAL | Status: DC

## 2015-05-26 ENCOUNTER — Encounter: Payer: Self-pay | Admitting: *Deleted

## 2015-05-27 ENCOUNTER — Telehealth: Payer: Self-pay | Admitting: Cardiology

## 2015-05-27 MED ORDER — OMEPRAZOLE 20 MG PO CPDR: 20.0000 mg | DELAYED_RELEASE_CAPSULE | Freq: Every day | ORAL | Status: DC

## 2015-05-27 NOTE — Telephone Encounter (Signed)
NEW  SCRIPT  FAXED AS REQUESTED .[name]

## 2015-05-27 NOTE — Telephone Encounter (Signed)
NewMessage  Pt stated that in order to have free samples of prilosec sent from [facility]- pt needed new Rx from Dr Mare Ferrari faxed to [phone] attn: Katrina, .please advise

## 2015-05-28 ENCOUNTER — Telehealth: Payer: Self-pay | Admitting: Cardiology

## 2015-05-28 NOTE — Telephone Encounter (Signed)
New Message   Pt wants a prescription for Prilosec 20.g mg   It has to be faxed over [phone]

## 2015-05-28 NOTE — Telephone Encounter (Signed)
Left message to call back  

## 2015-05-28 NOTE — Telephone Encounter (Signed)
Spoke with pt and she states that she thought she had sent a hand written prescription for the Prilosec to [facility] pharmacy but they called her and said they would need one sent. Informed pt that Devra Dopp, LPN sent in prescription yesterday for her Prilosec. Advised pt if they call back or say they did not receive it to just let us know. Pt verbalized understanding and was very appreciative for all the help.

## 2015-09-29 ENCOUNTER — Other Ambulatory Visit: Payer: Self-pay | Admitting: Cardiovascular Disease

## 2015-09-29 ENCOUNTER — Other Ambulatory Visit: Payer: Self-pay

## 2015-09-29 MED ORDER — ATORVASTATIN CALCIUM 10 MG PO TABS: ORAL_TABLET | ORAL | Status: DC

## 2015-09-29 NOTE — Telephone Encounter (Signed)
Ok to refill #30 until she sees Dr. Acie Fredrickson

## 2015-10-08 ENCOUNTER — Other Ambulatory Visit (INDEPENDENT_AMBULATORY_CARE_PROVIDER_SITE_OTHER): Payer: Medicare Other | Admitting: *Deleted

## 2015-10-08 DIAGNOSIS — M8580 Other specified disorders of bone density and structure, unspecified site: Secondary | ICD-10-CM | POA: Diagnosis not present

## 2015-10-08 DIAGNOSIS — Z853 Personal history of malignant neoplasm of breast: Secondary | ICD-10-CM

## 2015-10-08 DIAGNOSIS — I119 Hypertensive heart disease without heart failure: Secondary | ICD-10-CM

## 2015-10-08 DIAGNOSIS — E7800 Pure hypercholesterolemia, unspecified: Secondary | ICD-10-CM

## 2015-10-08 LAB — BASIC METABOLIC PANEL
BUN: 16 mg/dL (ref 7–25)
CHLORIDE: 102 mmol/L (ref 98–110)
CO2: 27 mmol/L (ref 20–31)
CREATININE: 1 mg/dL — AB (ref 0.60–0.88)
Calcium: 9.6 mg/dL (ref 8.6–10.4)
GLUCOSE: 107 mg/dL — AB (ref 65–99)
Potassium: 4.3 mmol/L (ref 3.5–5.3)
Sodium: 138 mmol/L (ref 135–146)

## 2015-10-08 LAB — HEPATIC FUNCTION PANEL
ALK PHOS: 48 U/L (ref 33–130)
ALT: 20 U/L (ref 6–29)
AST: 18 U/L (ref 10–35)
Albumin: 4 g/dL (ref 3.6–5.1)
BILIRUBIN DIRECT: 0.1 mg/dL (ref <=0.2)
BILIRUBIN INDIRECT: 0.3 mg/dL (ref 0.2–1.2)
BILIRUBIN TOTAL: 0.4 mg/dL (ref 0.2–1.2)
Total Protein: 6.7 g/dL (ref 6.1–8.1)

## 2015-10-08 LAB — LIPID PANEL
Cholesterol: 203 mg/dL — ABNORMAL HIGH (ref 125–200)
HDL: 49 mg/dL (ref >=46)
LDL CALC: 103 mg/dL (ref <130)
TRIGLYCERIDES: 254 mg/dL — AB (ref <150)
Total CHOL/HDL Ratio: 4.1 Ratio (ref <=5.0)
VLDL: 51 mg/dL — AB (ref <30)

## 2015-10-08 NOTE — Addendum Note (Signed)
Addended by: Eulis Foster on: 10/08/2015 10:11 AM   Modules accepted: Orders

## 2015-10-12 ENCOUNTER — Encounter: Payer: Self-pay | Admitting: Cardiovascular Disease

## 2015-10-12 ENCOUNTER — Ambulatory Visit (INDEPENDENT_AMBULATORY_CARE_PROVIDER_SITE_OTHER): Payer: Medicare Other | Admitting: Cardiovascular Disease

## 2015-10-12 VITALS — BP 130/76 | HR 111 | Ht 59.0 in | Wt 98.4 lb

## 2015-10-12 DIAGNOSIS — E7800 Pure hypercholesterolemia, unspecified: Secondary | ICD-10-CM | POA: Diagnosis not present

## 2015-10-12 DIAGNOSIS — I119 Hypertensive heart disease without heart failure: Secondary | ICD-10-CM | POA: Diagnosis not present

## 2015-10-12 NOTE — Progress Notes (Signed)
Cardiology Office Note   Date:  10/12/2015   ID:  [name], DOB February 02, 1929, MRN [id number]  PCP:  Warren Danes, MD  Cardiologist: Darlin Coco MD  Chief Complaint  Patient presents with  . Hyperlipidemia   Problem list 1. Essential hypertension 2. Hyperlipidemia 3. History of breast cancer   History of Present Illness: [name] is a 82 y.o. female who presents for  Six-month visit  This pleasant 82 year old woman is seen for a scheduled followup office visit. She has a history of high blood pressure and hypercholesterolemia. She has a past history of breast cancer left breast in 1986 and in 1989. She had a mastectomy in 1989. There has been no evidence of recurrence. She has a history of osteopenia. She has a known soft right carotid bruit. No TIA symptoms. The Doppler was done on 10/17/11 and again on 11/04/12 showed no evidence of obstruction. The degree of stenosis was less than 39% bilaterally. Since last visit she is on a lower dose of Lipitor just 10 mg a day. She has had no new cardiac symptoms. She denies chest pain.she has been doing some walking. Her lipids are stable. Her blood pressure is often running borderline high and she is concerned about that. She is not currently on any blood pressure medicines.  Oct 12, 2015:  Doing well Is a bit anxious - has white coat syndrome Is fairly active .  Walks some  HR is a bit fast  HR is typically well controlled at home.  Grew up in [place] , [place] Worked for [employer] - was later acquired by [employer]     Past Medical History  Diagnosis Date  . Hypercholesteremia   . Breast cancer ([place])   . Hypertension   . Osteopenia   . Osteoporosis   . Hiatal hernia   . Diverticulosis     Past Surgical History  Procedure Laterality Date  . Mastectomy  1999    LEFT  . Abdominal hysterectomy       Current Outpatient Prescriptions  Medication Sig Dispense Refill  . ALPRAZolam (XANAX) 0.25 MG  tablet Take 0.25 mg by mouth at bedtime as needed for anxiety (sleep).    . Ascorbic Acid (VITAMIN C) 500 MG tablet Take 500 mg by mouth daily.      Marland Kitchen aspirin 325 MG tablet Take 325 mg by mouth daily.      Marland Kitchen atorvastatin (LIPITOR) 10 MG tablet TAKE 1 TABLET BY MOUTH EVERY DAY AT 6 PM 90 tablet 0  . Cholecalciferol (VITAMIN D) 1000 UNITS capsule Take 1,000 Units by mouth daily.      . Coenzyme Q10 (CO Q-10) 200 MG CAPS Take 1 capsule by mouth daily.     . Ibuprofen-Diphenhydramine Cit (ADVIL PM PO) Take 1 tablet by mouth at bedtime as needed (for arthritis).     Marland Kitchen losartan (COZAAR) 25 MG tablet Take 1 tablet (25 mg total) by mouth daily. 30 tablet 5  . Multiple Vitamin (MULTIVITAMIN) tablet Take 1 tablet by mouth daily.      Marland Kitchen omeprazole (PRILOSEC) 20 MG capsule Take 1 capsule (20 mg total) by mouth daily. 90 capsule 3  . Psyllium (METAMUCIL PO) Take 1 Dose by mouth daily as needed (for constipation).     . raloxifene (EVISTA) 60 MG tablet TAKE 1 TABLET BY MOUTH ONCE DAILY 90 tablet 1  . vitamin B-12 (CYANOCOBALAMIN) 500 MCG tablet Take 500 mcg by mouth daily.  No current facility-administered medications for this visit.    Allergies:   Review of patient's allergies indicates no known allergies.    Social History:  The patient  reports that she has never smoked. She has never used smokeless tobacco.   Family History:  The patient's family history includes Arthritis in her father; Hypertension in her mother; Stroke in her father.    ROS:  Please see the history of present illness.   Otherwise, review of systems are positive for none.   All other systems are reviewed and negative.    PHYSICAL EXAM: VS:  BP 130/76 mmHg  Pulse 111  Ht 4\' 11"  (1.499 m)  Wt 98 lb 6.4 oz (44.634 kg)  BMI 19.86 kg/m2 , BMI Body mass index is 19.86 kg/(m^2). GEN: Well nourished, well developed, in no acute distress HEENT: normal Neck: no JVD, c there is a right carotid bruit,  Cardiac: RRR;  There is a  grade 2/6 systolic ejection murmur at the aortic area. No diastolic murmur heard., rubs, or gallops,no edema  Respiratory:  clear to auscultation bilaterally, normal work of breathing GI: soft, nontender, nondistended, + BS MS: no deformity or atrophy Skin: warm and dry, no rash Neuro:  Strength and sensation are intact Psych: euthymic mood, full affect   EKG:  EKG is not ordered today.     Recent Labs: 04/07/2015: Hemoglobin 13.7; Platelets 254 10/08/2015: ALT 20; BUN 16; Creat 1.00*; Potassium 4.3; Sodium 138    Lipid Panel    Component Value Date/Time   CHOL 203* 10/08/2015 1011   TRIG 254* 10/08/2015 1011   HDL 49 10/08/2015 1011   CHOLHDL 4.1 10/08/2015 1011   VLDL 51* 10/08/2015 1011   LDLCALC 103 10/08/2015 1011   LDLDIRECT 102.8 03/31/2014 0922      Wt Readings from Last 3 Encounters:  10/12/15 98 lb 6.4 oz (44.634 kg)  04/08/15 100 lb (45.36 kg)  09/29/14 99 lb 3.2 oz (44.997 kg)        ASSESSMENT AND PLAN:   1. Hypercholesterolemia 2. benign hypertensive heart disease without heart failure. 3. prior breast cancer with mastectomy left breast 1989, no evidence of recurrence 4. Osteopenia 5. right carotid bruit 6. whitecoat syndrome 7.  Systolic heart murmur at aortic area  Plan: Continue same medication. Recheck in 6 months for followup office visit and fasting lab work.   Current medicines are reviewed at length with the patient today.  The patient does not have concerns regarding medicines.  The following changes have been made:   Stop niacin. Evidence does not support its continued use. Start losartan 25 mg one daily for blood pressure. She will return in one week for a follow-up basal metabolic panel.  Labs/ tests ordered today include:   No orders of the defined types were placed in this encounter.        Mertie Moores, MD  12/13/2015 12:02 PM    [address],  [place], [place]   [address] Pager [phone] Phone: [phone]; Fax: [phone]

## 2015-10-12 NOTE — Patient Instructions (Signed)

## 2015-10-26 ENCOUNTER — Other Ambulatory Visit: Payer: Self-pay

## 2015-10-26 MED ORDER — LOSARTAN POTASSIUM 25 MG PO TABS: 25.0000 mg | ORAL_TABLET | Freq: Every day | ORAL | Status: DC

## 2015-11-01 DIAGNOSIS — Z23 Encounter for immunization: Secondary | ICD-10-CM | POA: Diagnosis not present

## 2015-11-29 DIAGNOSIS — Z1389 Encounter for screening for other disorder: Secondary | ICD-10-CM | POA: Diagnosis not present

## 2015-11-29 DIAGNOSIS — Z0000 Encounter for general adult medical examination without abnormal findings: Secondary | ICD-10-CM | POA: Diagnosis not present

## 2015-11-29 DIAGNOSIS — M810 Age-related osteoporosis without current pathological fracture: Secondary | ICD-10-CM | POA: Diagnosis not present

## 2015-11-29 DIAGNOSIS — R739 Hyperglycemia, unspecified: Secondary | ICD-10-CM | POA: Diagnosis not present

## 2015-11-29 DIAGNOSIS — K219 Gastro-esophageal reflux disease without esophagitis: Secondary | ICD-10-CM | POA: Diagnosis not present

## 2015-11-29 DIAGNOSIS — M1990 Unspecified osteoarthritis, unspecified site: Secondary | ICD-10-CM | POA: Diagnosis not present

## 2015-11-29 DIAGNOSIS — Z1231 Encounter for screening mammogram for malignant neoplasm of breast: Secondary | ICD-10-CM | POA: Diagnosis not present

## 2015-11-29 DIAGNOSIS — E784 Other hyperlipidemia: Secondary | ICD-10-CM | POA: Diagnosis not present

## 2015-11-29 DIAGNOSIS — Z6820 Body mass index (BMI) 20.0-20.9, adult: Secondary | ICD-10-CM | POA: Diagnosis not present

## 2015-11-29 DIAGNOSIS — I10 Essential (primary) hypertension: Secondary | ICD-10-CM | POA: Diagnosis not present

## 2015-11-29 DIAGNOSIS — C50919 Malignant neoplasm of unspecified site of unspecified female breast: Secondary | ICD-10-CM | POA: Diagnosis not present

## 2015-12-01 ENCOUNTER — Encounter: Payer: Self-pay | Admitting: Cardiovascular Disease

## 2015-12-02 DIAGNOSIS — M810 Age-related osteoporosis without current pathological fracture: Secondary | ICD-10-CM | POA: Diagnosis not present

## 2015-12-06 ENCOUNTER — Other Ambulatory Visit: Payer: Self-pay | Admitting: Internal Medicine

## 2015-12-06 DIAGNOSIS — Z1231 Encounter for screening mammogram for malignant neoplasm of breast: Secondary | ICD-10-CM

## 2015-12-08 ENCOUNTER — Ambulatory Visit: Payer: Medicare Other

## 2015-12-08 ENCOUNTER — Ambulatory Visit
Admission: RE | Admit: 2015-12-08 | Discharge: 2015-12-08 | Disposition: A | Discharge status: Home / Self Care | Payer: Medicare Other | Source: Ambulatory Visit | Attending: Internal Medicine | Admitting: Internal Medicine

## 2015-12-08 DIAGNOSIS — Z1231 Encounter for screening mammogram for malignant neoplasm of breast: Secondary | ICD-10-CM

## 2015-12-30 DIAGNOSIS — E784 Other hyperlipidemia: Secondary | ICD-10-CM | POA: Diagnosis not present

## 2015-12-30 DIAGNOSIS — Z6821 Body mass index (BMI) 21.0-21.9, adult: Secondary | ICD-10-CM | POA: Diagnosis not present

## 2015-12-30 DIAGNOSIS — E119 Type 2 diabetes mellitus without complications: Secondary | ICD-10-CM | POA: Diagnosis not present

## 2015-12-30 DIAGNOSIS — I10 Essential (primary) hypertension: Secondary | ICD-10-CM | POA: Diagnosis not present

## 2016-01-04 ENCOUNTER — Other Ambulatory Visit: Payer: Self-pay | Admitting: *Deleted

## 2016-01-31 ENCOUNTER — Other Ambulatory Visit: Payer: Self-pay | Admitting: Cardiovascular Disease

## 2016-02-24 ENCOUNTER — Telehealth: Payer: Self-pay | Admitting: Cardiovascular Disease

## 2016-02-24 DIAGNOSIS — E785 Hyperlipidemia, unspecified: Secondary | ICD-10-CM

## 2016-02-24 NOTE — Telephone Encounter (Signed)
New message      Pt has an appt on 11-15 with Dr Acie Fredrickson.  She will come on 04-11-16 for lipid panel and CMET.  Please put in order for pt.  If there is a problem, please call pt.

## 2016-02-25 NOTE — Telephone Encounter (Signed)
Fasting lipid profile and cmet ordered and linked to patient's lab appointment

## 2016-03-02 DIAGNOSIS — Z23 Encounter for immunization: Secondary | ICD-10-CM | POA: Diagnosis not present

## 2016-03-27 DIAGNOSIS — Z961 Presence of intraocular lens: Secondary | ICD-10-CM | POA: Diagnosis not present

## 2016-03-27 DIAGNOSIS — H52202 Unspecified astigmatism, left eye: Secondary | ICD-10-CM | POA: Diagnosis not present

## 2016-03-27 DIAGNOSIS — H04123 Dry eye syndrome of bilateral lacrimal glands: Secondary | ICD-10-CM | POA: Diagnosis not present

## 2016-03-27 DIAGNOSIS — H43813 Vitreous degeneration, bilateral: Secondary | ICD-10-CM | POA: Diagnosis not present

## 2016-04-10 ENCOUNTER — Other Ambulatory Visit: Payer: Medicare Other | Admitting: *Deleted

## 2016-04-10 DIAGNOSIS — E785 Hyperlipidemia, unspecified: Secondary | ICD-10-CM

## 2016-04-10 LAB — COMPREHENSIVE METABOLIC PANEL
ALT: 18 U/L (ref 6–29)
AST: 18 U/L (ref 10–35)
Albumin: 4 g/dL (ref 3.6–5.1)
Alkaline Phosphatase: 48 U/L (ref 33–130)
BUN: 14 mg/dL (ref 7–25)
CALCIUM: 9.2 mg/dL (ref 8.6–10.4)
CHLORIDE: 105 mmol/L (ref 98–110)
CO2: 26 mmol/L (ref 20–31)
Creat: 0.88 mg/dL (ref 0.60–0.88)
Glucose, Bld: 98 mg/dL (ref 65–99)
POTASSIUM: 4.8 mmol/L (ref 3.5–5.3)
Sodium: 138 mmol/L (ref 135–146)
TOTAL PROTEIN: 6.4 g/dL (ref 6.1–8.1)
Total Bilirubin: 0.4 mg/dL (ref 0.2–1.2)

## 2016-04-10 LAB — LIPID PANEL
Cholesterol: 152 mg/dL (ref <200)
HDL: 46 mg/dL — AB (ref >50)
LDL CALC: 53 mg/dL (ref <100)
TRIGLYCERIDES: 266 mg/dL — AB (ref <150)
Total CHOL/HDL Ratio: 3.3 Ratio (ref <5.0)
VLDL: 53 mg/dL — ABNORMAL HIGH (ref <30)

## 2016-04-11 ENCOUNTER — Other Ambulatory Visit: Payer: Medicare Other

## 2016-04-12 ENCOUNTER — Encounter: Payer: Self-pay | Admitting: Cardiovascular Disease

## 2016-04-12 ENCOUNTER — Ambulatory Visit (INDEPENDENT_AMBULATORY_CARE_PROVIDER_SITE_OTHER): Payer: Medicare Other | Admitting: Cardiovascular Disease

## 2016-04-12 VITALS — BP 160/60 | HR 120 | Ht 59.0 in | Wt 99.6 lb

## 2016-04-12 DIAGNOSIS — I119 Hypertensive heart disease without heart failure: Secondary | ICD-10-CM

## 2016-04-12 DIAGNOSIS — E7800 Pure hypercholesterolemia, unspecified: Secondary | ICD-10-CM

## 2016-04-12 MED ORDER — METOPROLOL SUCCINATE ER 25 MG PO TB24: 12.5000 mg | ORAL_TABLET | Freq: Every day | ORAL | 11 refills | Status: DC

## 2016-04-12 MED ORDER — ASPIRIN EC 81 MG PO TBEC: 81.0000 mg | DELAYED_RELEASE_TABLET | Freq: Every day | ORAL | Status: DC

## 2016-04-12 NOTE — Progress Notes (Signed)
Cardiology Office Note   Date:  04/12/2016   ID:  [name], DOB [birth year], MRN [id number]  PCP:  No primary care provider on file.  Cardiologist: Darlin Coco MD, Amiah Frohlich   Chief Complaint  Patient presents with  . Hypertension   Problem list 1. Essential hypertension 2. Hyperlipidemia 3. History of breast cancer   [name] is a 82 y.o. female    Note from [place]  This pleasant 82 year old woman is seen for a scheduled followup office visit. She has a history of high blood pressure and hypercholesterolemia. She has a past history of breast cancer left breast in 1986 and in 1989. She had a mastectomy in 1989. There has been no evidence of recurrence. She has a history of osteopenia. She has a known soft right carotid bruit. No TIA symptoms. The Doppler was done on 10/17/11 and again on 11/04/12 showed no evidence of obstruction. The degree of stenosis was less than 39% bilaterally. Since last visit she is on a lower dose of Lipitor just 10 mg a day. She has had no new cardiac symptoms. She denies chest pain.she has been doing some walking. Her lipids are stable. Her blood pressure is often running borderline high and she is concerned about that. She is not currently on any blood pressure medicines.  Oct 12, 2015:  Doing well Is a bit anxious - has white coat syndrome Is fairly active .  Walks some  HR is a bit fast  HR is typically well controlled at home.  Grew up in [place] , [place] Worked for [employer] - was later acquired by [employer]  Nov. 15, 2017:    [name] is seen today for follow-up of her hypertension and hyperlipidemia. Gets nervous when she comes to the doctors office HR and BP are both elevated.  BP was  135 / 75 earlier today .   HR was 92.      Past Medical History:  Diagnosis Date  . Breast cancer ([place])   . Diverticulosis   . Hiatal hernia   . Hypercholesteremia   . Hypertension   . Osteopenia   . Osteoporosis     Past  Surgical History:  Procedure Laterality Date  . ABDOMINAL HYSTERECTOMY    . MASTECTOMY  1999   LEFT     Current Outpatient Prescriptions  Medication Sig Dispense Refill  . ALPRAZolam (XANAX) 0.25 MG tablet Take 0.25 mg by mouth at bedtime as needed for anxiety (sleep).    . Ascorbic Acid (VITAMIN C) 500 MG tablet Take 500 mg by mouth daily.      Marland Kitchen aspirin 325 MG tablet Take 325 mg by mouth daily.      Marland Kitchen atorvastatin (LIPITOR) 20 MG tablet Take 20 mg by mouth daily.  3  . Cholecalciferol (VITAMIN D) 1000 UNITS capsule Take 1,000 Units by mouth daily.      . Coenzyme Q10 (CO Q-10) 200 MG CAPS Take 1 capsule by mouth daily.     Marland Kitchen ezetimibe (ZETIA) 10 MG tablet Take 10 mg by mouth daily.  2  . Ibuprofen-Diphenhydramine Cit (ADVIL PM PO) Take 1 tablet by mouth at bedtime as needed (for arthritis).     Marland Kitchen losartan (COZAAR) 25 MG tablet Take 1 tablet (25 mg total) by mouth daily. 30 tablet 5  . Multiple Vitamin (MULTIVITAMIN) tablet Take 1 tablet by mouth daily.      Marland Kitchen omeprazole (PRILOSEC) 20 MG capsule Take 1 capsule (20 mg  total) by mouth daily. 90 capsule 3  . Psyllium (METAMUCIL PO) Take 1 Dose by mouth daily as needed (for constipation).     . raloxifene (EVISTA) 60 MG tablet TAKE 1 TABLET BY MOUTH ONCE DAILY 90 tablet 1  . vitamin B-12 (CYANOCOBALAMIN) 500 MCG tablet Take 500 mcg by mouth daily.      No current facility-administered medications for this visit.     Allergies:   Patient has no known allergies.    Social History:  The patient  reports that she has never smoked. She has never used smokeless tobacco.   Family History:  The patient's family history includes Arthritis in her father; Hypertension in her mother; Stroke in her father.    ROS:  Please see the history of present illness.   Otherwise, review of systems are positive for none.   All other systems are reviewed and negative.    PHYSICAL EXAM: VS:  BP (!) 160/60   Pulse (!) 120   Ht 4\' 11"  (1.499 m)   Wt 99  lb 9.6 oz (45.2 kg)   BMI 20.12 kg/m  , BMI Body mass index is 20.12 kg/m. GEN: Well nourished, well developed, in no acute distress  HEENT: normal  Neck: no JVD,  there is a right carotid bruit,  Cardiac: RRR;  Tachycardia There is a grade 2/6 systolic ejection murmur at the aortic area. No diastolic murmur heard., rubs, or gallops,no edema  Respiratory:  clear to auscultation bilaterally, normal work of breathing GI: soft, nontender, nondistended, + BS MS: no deformity or atrophy  Skin: warm and dry, no rash Neuro:  Strength and sensation are intact Psych: euthymic mood, full affect   EKG:  EKG is not ordered today.     Recent Labs: 04/10/2016: ALT 18; BUN 14; Creat 0.88; Potassium 4.8; Sodium 138    Lipid Panel    Component Value Date/Time   CHOL 152 04/10/2016 0953   TRIG 266 (H) 04/10/2016 0953   HDL 46 (L) 04/10/2016 0953   CHOLHDL 3.3 04/10/2016 0953   VLDL 53 (H) 04/10/2016 0953   LDLCALC 53 04/10/2016 0953   LDLDIRECT 102.8 03/31/2014 0922      Wt Readings from Last 3 Encounters:  04/12/16 99 lb 9.6 oz (45.2 kg)  10/12/15 98 lb 6.4 oz (44.6 kg)  04/08/15 100 lb (45.4 kg)        ASSESSMENT AND PLAN:   1. Hypercholesterolemia - her atorvastatin was recently increased by Dr. Joylene Draft. She was also started on Zetia. At her suggestion, we will but Dr. Joylene Draft manage her lipids.   2. benign hypertensive heart disease without heart failure. Her heart rate is a little elevated today. We will add Toprol-XL 12.5 mg a day. I'll see her again in 6 month. We may follow her on an as-needed basis following that visit.   3. prior breast cancer with mastectomy left breast 1989, no evidence of recurrence 4. Osteopenia 5. right carotid bruit 6. whitecoat Hypertension syndrome 7. Aortic stenosis - mild   Plan: Continue same medication. Recheck in 6 months for followup office visit and fasting lab work.   Current medicines are reviewed at length with the patient  today.  The patient does not have concerns regarding medicines.  The following changes have been made:   Stop niacin. Evidence does not support its continued use. Start losartan 25 mg one daily for blood pressure. She will return in one week for a follow-up basal metabolic panel.  Labs/ tests  ordered today include:   No orders of the defined types were placed in this encounter.    Mertie Moores, MD  04/12/2016 3:53 PM    [address],  [place], [place]  [address] Pager [phone] Phone: [phone]; Fax: [phone]

## 2016-04-12 NOTE — Patient Instructions (Addendum)
Medication Instructions:  START Toprol XL (Metoprolol) 12.5 mg (1/2 of 25 mg tab) REDUCE Aspirin to 81 mg once daily  Labwork: None Ordered   Testing/Procedures: None Ordered   Follow-Up: Your physician wants you to follow-up in: 6 months with Dr. Acie Fredrickson.  You will receive a reminder letter in the mail two months in advance. If you don't receive a letter, please call our office to schedule the follow-up appointment.   If you need a refill on your cardiac medications before your next appointment, please call your pharmacy.   Thank you for choosing [facility]! Christen Bame, RN [phone]

## 2016-05-02 ENCOUNTER — Other Ambulatory Visit: Payer: Self-pay | Admitting: Cardiovascular Disease

## 2016-10-04 ENCOUNTER — Telehealth: Payer: Self-pay | Admitting: Cardiovascular Disease

## 2016-10-04 DIAGNOSIS — E782 Mixed hyperlipidemia: Secondary | ICD-10-CM

## 2016-10-04 NOTE — Telephone Encounter (Signed)
Patient calling and would like to verify if she needs to have lab work completed. Please call to discuss, thanks.

## 2016-10-04 NOTE — Telephone Encounter (Signed)
Left detailed message on patient's home voice mail that fasting lab work is needed. I advised her to call back to schedule an appointment and/or to call back and ask for me.

## 2016-10-17 ENCOUNTER — Other Ambulatory Visit: Payer: Medicare Other

## 2016-10-17 DIAGNOSIS — E782 Mixed hyperlipidemia: Secondary | ICD-10-CM | POA: Diagnosis not present

## 2016-10-17 LAB — LIPID PANEL
CHOL/HDL RATIO: 3.4 ratio (ref 0.0–4.4)
Cholesterol, Total: 150 mg/dL (ref 100–199)
HDL: 44 mg/dL (ref >39)
LDL Calculated: 65 mg/dL (ref 0–99)
TRIGLYCERIDES: 205 mg/dL — AB (ref 0–149)
VLDL Cholesterol Cal: 41 mg/dL — ABNORMAL HIGH (ref 5–40)

## 2016-10-17 LAB — COMPREHENSIVE METABOLIC PANEL
A/G RATIO: 1.8 (ref 1.2–2.2)
ALBUMIN: 4.1 g/dL (ref 3.5–4.7)
ALK PHOS: 54 IU/L (ref 39–117)
ALT: 20 IU/L (ref 0–32)
AST: 18 IU/L (ref 0–40)
BILIRUBIN TOTAL: 0.3 mg/dL (ref 0.0–1.2)
BUN / CREAT RATIO: 15 (ref 12–28)
BUN: 14 mg/dL (ref 8–27)
CHLORIDE: 104 mmol/L (ref 96–106)
CO2: 22 mmol/L (ref 18–29)
Calcium: 9.5 mg/dL (ref 8.7–10.3)
Creatinine, Ser: 0.91 mg/dL (ref 0.57–1.00)
GFR calc non Af Amer: 58 mL/min/{1.73_m2} — ABNORMAL LOW (ref >59)
GFR, EST AFRICAN AMERICAN: 67 mL/min/{1.73_m2} (ref >59)
GLOBULIN, TOTAL: 2.3 g/dL (ref 1.5–4.5)
GLUCOSE: 103 mg/dL — AB (ref 65–99)
Potassium: 5 mmol/L (ref 3.5–5.2)
SODIUM: 141 mmol/L (ref 134–144)
TOTAL PROTEIN: 6.4 g/dL (ref 6.0–8.5)

## 2016-10-18 ENCOUNTER — Encounter: Payer: Self-pay | Admitting: Physician Assistant

## 2016-10-18 ENCOUNTER — Ambulatory Visit (INDEPENDENT_AMBULATORY_CARE_PROVIDER_SITE_OTHER): Payer: Medicare Other | Admitting: Physician Assistant

## 2016-10-18 VITALS — BP 134/70 | HR 87 | Ht 59.0 in | Wt 99.0 lb

## 2016-10-18 DIAGNOSIS — I119 Hypertensive heart disease without heart failure: Secondary | ICD-10-CM

## 2016-10-18 MED ORDER — ASPIRIN EC 81 MG PO TBEC: 81.0000 mg | DELAYED_RELEASE_TABLET | Freq: Every day | ORAL | 3 refills | Status: DC

## 2016-10-18 NOTE — Patient Instructions (Signed)
Medication Instructions:  DECREASE ASPIRIN TO 53 MG DAILY  Labwork: NONE ORDRED  Testing/Procedures: NONE ORDERED  Follow-Up: Your physician wants you to follow-up in: DR. Acie Fredrickson IN 1 YEAR You will receive a reminder letter in the mail two months in advance. If you don't receive a letter, please call our office to schedule the follow-up appointment.    Any Other Special Instructions Will Be Listed Below (If Applicable).     If you need a refill on your cardiac medications before your next appointment, please call your pharmacy.

## 2016-10-18 NOTE — Progress Notes (Signed)
Cardiology Office Note:    Date:  10/18/2016   ID:  [name], DOB 1929-05-05, MRN [id number]  PCP:  Crist Infante, MD  Cardiologist:  Dr. Liam Rogers    Referring MD: No ref. provider found   Chief Complaint  Patient presents with  . Follow-up    HTN    History of Present Illness:    [name] is a 83 y.o. female with a hx of HTN, HL, recurrent breast CA status post mastectomy 1989, mild carotid stenosis. Last seen by Dr. Acie Fredrickson 03/2016.  Ms. Van returns for follow up. She is here alone.  She is overall doing well.  She denies chest pain, shortness of breath, syncope, orthopnea, PND or significant pedal edema.   Prior CV studies:   The following studies were reviewed today:  Echo 03/2015 Moderate focal basal septal hypertrophy, vigorous LVF, EF 65-70, normal wall motion, grade 1 diastolic dysfunction, MAC, moderately increased PASP  Carotid US 6/14 Bilateral ICA 1-39  Past Medical History:  Diagnosis Date  . Breast cancer ([place])   . Diverticulosis   . Hiatal hernia   . Hypercholesteremia   . Hypertension   . Osteopenia   . Osteoporosis     Past Surgical History:  Procedure Laterality Date  . ABDOMINAL HYSTERECTOMY    . MASTECTOMY  1999   LEFT    Current Medications: Current Meds  Medication Sig  . ALPRAZolam (XANAX) 0.25 MG tablet Take 1/2 tablet by mouth at bedtime as needed for anxiety (sleep)  . Ascorbic Acid (VITAMIN C) 500 MG tablet Take 500 mg by mouth daily.    Marland Kitchen atorvastatin (LIPITOR) 20 MG tablet Take 20 mg by mouth daily.  . Cholecalciferol (VITAMIN D) 1000 UNITS capsule Take 1,000 Units by mouth daily.    . Coenzyme Q10 (CO Q-10) 200 MG CAPS Take 1 capsule by mouth daily.   Marland Kitchen ezetimibe (ZETIA) 10 MG tablet Take 10 mg by mouth daily.  . Ibuprofen-Diphenhydramine Cit (ADVIL PM PO) Take 1 tablet by mouth at bedtime as needed (for arthritis).   Marland Kitchen losartan (COZAAR) 25 MG tablet TAKE 1 TABLET(25 MG) BY MOUTH DAILY  . metoprolol succinate (TOPROL  XL) 25 MG 24 hr tablet Take 0.5 tablets (12.5 mg total) by mouth daily.  . Multiple Vitamin (MULTIVITAMIN) tablet Take 1 tablet by mouth daily.    Marland Kitchen omeprazole (PRILOSEC) 20 MG capsule Take 1 capsule (20 mg total) by mouth daily.  . Psyllium (METAMUCIL PO) Take 1 Dose by mouth daily as needed (for constipation).   . raloxifene (EVISTA) 60 MG tablet TAKE 1 TABLET BY MOUTH ONCE DAILY  . vitamin B-12 (CYANOCOBALAMIN) 500 MCG tablet Take 500 mcg by mouth daily.   . [DISCONTINUED] aspirin 325 MG tablet Take 325 mg by mouth daily.     Allergies:   Patient has no known allergies.   Social History   Social History  . Marital status: Single    Spouse name: N/A  . Number of children: N/A  . Years of education: N/A   Social History Main Topics  . Smoking status: Never Smoker  . Smokeless tobacco: Never Used  . Alcohol use None  . Drug use: Unknown  . Sexual activity: Not Asked   Other Topics Concern  . None   Social History Narrative  . None     Family Hx: The patient's family history includes Arthritis in her father; Hypertension in her mother; Stroke in her father.  ROS:   Please  see the history of present illness.    ROS All other systems reviewed and are negative.   EKGs/Labs/Other Test Reviewed:    EKG:  EKG is  ordered today.  The ekg ordered today demonstrates NSR, HR 87, normal axis, no ST changes, QTc 440 ms, no change from prior tracing  Recent Labs: 10/17/2016: ALT 20; BUN 14; Creatinine, Ser 0.91; Potassium 5.0; Sodium 141  LDL 53.000 04/10/2016  Recent Lipid Panel    Component Value Date/Time   CHOL 150 10/17/2016 0945   TRIG 205 (H) 10/17/2016 0945   HDL 44 10/17/2016 0945   CHOLHDL 3.4 10/17/2016 0945   CHOLHDL 3.3 04/10/2016 0953   VLDL 53 (H) 04/10/2016 0953   LDLCALC 65 10/17/2016 0945   LDLDIRECT 102.8 03/31/2014 0922     Physical Exam:    VS:  BP 134/70   Pulse 87   Ht _0  (1.499 m)   Wt 99 lb (44.9 kg)   BMI 20.00 kg/m     Wt  Readings from Last 3 Encounters:  10/18/16 99 lb (44.9 kg)  04/12/16 99 lb 9.6 oz (45.2 kg)  10/12/15 98 lb 6.4 oz (44.6 kg)     Physical Exam  Constitutional: She is oriented to person, place, and time. She appears well-developed and well-nourished. No distress.  HENT:  Head: Normocephalic and atraumatic.  Eyes: No scleral icterus.  Neck: Normal range of motion. No JVD present. Carotid bruit is not present.  Cardiovascular: Normal rate, regular rhythm, S1 normal, S2 normal and normal heart sounds.   No murmur heard. Pulmonary/Chest: Breath sounds normal. She has no wheezes. She has no rhonchi. She has no rales.  Abdominal: Soft. There is no tenderness.  Musculoskeletal: She exhibits no edema.  Neurological: She is alert and oriented to person, place, and time.  Skin: Skin is warm and dry.  Psychiatric: She has a normal mood and affect.    ASSESSMENT:    1. Benign hypertensive heart disease without heart failure    PLAN:    In order of problems listed above:  1. Benign hypertensive heart disease without heart failure -  The patient's blood pressure is controlled on her current regimen.  Continue current therapy.  She does have higher BPs in the office than at home.  Recent labs reviewed with the patient today.  Her recent labs demonstrate normal renal function, normal liver function and an optimal LDL. She can decrease her ASA to 81 mg QD.  Dispo:  Return in about 1 year (around 10/18/2017) for Routine Follow Up, w/ Dr. Acie Fredrickson.   Medication Adjustments/Labs and Tests Ordered: Current medicines are reviewed at length with the patient today.  Concerns regarding medicines are outlined above.  Orders/Tests:  Orders Placed This Encounter  Procedures  . EKG 12-Lead   Medication changes: Meds ordered this encounter  Medications  . aspirin EC 81 MG tablet    Sig: Take 1 tablet (81 mg total) by mouth daily.    Dispense:  90 tablet    Refill:  3    Order Specific Question:    Supervising Provider    Answer:   Sherren Mocha [[id number]]   Signed, Richardson Dopp, PA-C  10/18/2016 3:53 PM    [facility] [place], [place], [place]  [address] Phone: [phone]; Fax: [phone]

## 2017-01-05 DIAGNOSIS — M810 Age-related osteoporosis without current pathological fracture: Secondary | ICD-10-CM | POA: Diagnosis not present

## 2017-01-05 DIAGNOSIS — I10 Essential (primary) hypertension: Secondary | ICD-10-CM | POA: Diagnosis not present

## 2017-01-05 DIAGNOSIS — E784 Other hyperlipidemia: Secondary | ICD-10-CM | POA: Diagnosis not present

## 2017-01-05 DIAGNOSIS — E119 Type 2 diabetes mellitus without complications: Secondary | ICD-10-CM | POA: Diagnosis not present

## 2017-01-11 DIAGNOSIS — Z0000 Encounter for general adult medical examination without abnormal findings: Secondary | ICD-10-CM | POA: Diagnosis not present

## 2017-01-11 DIAGNOSIS — E784 Other hyperlipidemia: Secondary | ICD-10-CM | POA: Diagnosis not present

## 2017-01-11 DIAGNOSIS — K219 Gastro-esophageal reflux disease without esophagitis: Secondary | ICD-10-CM | POA: Diagnosis not present

## 2017-01-11 DIAGNOSIS — Z1389 Encounter for screening for other disorder: Secondary | ICD-10-CM | POA: Diagnosis not present

## 2017-01-11 DIAGNOSIS — Z6821 Body mass index (BMI) 21.0-21.9, adult: Secondary | ICD-10-CM | POA: Diagnosis not present

## 2017-01-11 DIAGNOSIS — I10 Essential (primary) hypertension: Secondary | ICD-10-CM | POA: Diagnosis not present

## 2017-01-11 DIAGNOSIS — M1990 Unspecified osteoarthritis, unspecified site: Secondary | ICD-10-CM | POA: Diagnosis not present

## 2017-01-11 DIAGNOSIS — E119 Type 2 diabetes mellitus without complications: Secondary | ICD-10-CM | POA: Diagnosis not present

## 2017-01-11 DIAGNOSIS — M810 Age-related osteoporosis without current pathological fracture: Secondary | ICD-10-CM | POA: Diagnosis not present

## 2017-01-11 DIAGNOSIS — Z1231 Encounter for screening mammogram for malignant neoplasm of breast: Secondary | ICD-10-CM | POA: Diagnosis not present

## 2017-01-11 DIAGNOSIS — C50919 Malignant neoplasm of unspecified site of unspecified female breast: Secondary | ICD-10-CM | POA: Diagnosis not present

## 2017-01-18 DIAGNOSIS — Z1212 Encounter for screening for malignant neoplasm of rectum: Secondary | ICD-10-CM | POA: Diagnosis not present

## 2017-03-26 DIAGNOSIS — Z23 Encounter for immunization: Secondary | ICD-10-CM | POA: Diagnosis not present

## 2017-04-27 DIAGNOSIS — H02831 Dermatochalasis of right upper eyelid: Secondary | ICD-10-CM | POA: Diagnosis not present

## 2017-04-27 DIAGNOSIS — H52202 Unspecified astigmatism, left eye: Secondary | ICD-10-CM | POA: Diagnosis not present

## 2017-04-27 DIAGNOSIS — H02834 Dermatochalasis of left upper eyelid: Secondary | ICD-10-CM | POA: Diagnosis not present

## 2017-04-27 DIAGNOSIS — H04123 Dry eye syndrome of bilateral lacrimal glands: Secondary | ICD-10-CM | POA: Diagnosis not present

## 2017-04-28 ENCOUNTER — Other Ambulatory Visit: Payer: Self-pay | Admitting: Cardiovascular Disease

## 2017-06-12 DIAGNOSIS — Z1389 Encounter for screening for other disorder: Secondary | ICD-10-CM | POA: Diagnosis not present

## 2017-06-12 DIAGNOSIS — M1990 Unspecified osteoarthritis, unspecified site: Secondary | ICD-10-CM | POA: Diagnosis not present

## 2017-06-12 DIAGNOSIS — M79676 Pain in unspecified toe(s): Secondary | ICD-10-CM | POA: Diagnosis not present

## 2017-06-12 DIAGNOSIS — E119 Type 2 diabetes mellitus without complications: Secondary | ICD-10-CM | POA: Diagnosis not present

## 2017-06-12 DIAGNOSIS — I10 Essential (primary) hypertension: Secondary | ICD-10-CM | POA: Diagnosis not present

## 2017-06-12 DIAGNOSIS — Z6820 Body mass index (BMI) 20.0-20.9, adult: Secondary | ICD-10-CM | POA: Diagnosis not present

## 2017-06-21 DIAGNOSIS — L814 Other melanin hyperpigmentation: Secondary | ICD-10-CM | POA: Diagnosis not present

## 2017-06-21 DIAGNOSIS — D2271 Melanocytic nevi of right lower limb, including hip: Secondary | ICD-10-CM | POA: Diagnosis not present

## 2017-06-21 DIAGNOSIS — L821 Other seborrheic keratosis: Secondary | ICD-10-CM | POA: Diagnosis not present

## 2017-06-21 DIAGNOSIS — D225 Melanocytic nevi of trunk: Secondary | ICD-10-CM | POA: Diagnosis not present

## 2017-06-21 DIAGNOSIS — D1801 Hemangioma of skin and subcutaneous tissue: Secondary | ICD-10-CM | POA: Diagnosis not present

## 2017-06-21 DIAGNOSIS — L438 Other lichen planus: Secondary | ICD-10-CM | POA: Diagnosis not present

## 2017-06-25 ENCOUNTER — Encounter: Payer: Self-pay | Admitting: Podiatry

## 2017-06-25 ENCOUNTER — Ambulatory Visit (INDEPENDENT_AMBULATORY_CARE_PROVIDER_SITE_OTHER): Payer: Medicare Other | Admitting: Podiatry

## 2017-06-25 ENCOUNTER — Ambulatory Visit (INDEPENDENT_AMBULATORY_CARE_PROVIDER_SITE_OTHER): Payer: Medicare Other

## 2017-06-25 DIAGNOSIS — M2042 Other hammer toe(s) (acquired), left foot: Secondary | ICD-10-CM

## 2017-06-25 DIAGNOSIS — L84 Corns and callosities: Secondary | ICD-10-CM | POA: Diagnosis not present

## 2017-06-25 DIAGNOSIS — M79672 Pain in left foot: Secondary | ICD-10-CM | POA: Diagnosis not present

## 2017-06-25 NOTE — Patient Instructions (Signed)
Hammer Toe Hammer toe is a change in the shape (a deformity) of your second, third, or fourth toe. The deformity causes the middle joint of your toe to stay bent. This causes pain, especially when you are wearing shoes. Hammer toe starts gradually. At first, the toe can be straightened. Gradually over time, the deformity becomes stiff and permanent. Early treatments to keep the toe straight may relieve pain. As the deformity becomes stiff and permanent, surgery may be needed to straighten the toe. What are the causes? Hammer toe is caused by abnormal bending of the toe joint that is closest to your foot. It happens gradually over time. This pulls on the muscles and connections (tendons) of the toe joint, making them weak and stiff. It is often related to wearing shoes that are too short or narrow and do not let your toes straighten. What increases the risk? You may be at greater risk for hammer toe if you:  Are female.  Are older.  Wear shoes that are too small.  Wear high-heeled shoes that pinch your toes.  Are a ballet dancer.  Have a second toe that is longer than your big toe (first toe).  Injure your foot or toe.  Have arthritis.  Have a family history of hammer toe.  Have a nerve or muscle disorder.  What are the signs or symptoms? The main symptoms of this condition are pain and deformity of the toe. The pain is worse when wearing shoes, walking, or running. Other symptoms may include:  Corns or calluses over the bent part of the toe or between the toes.  Redness and a burning feeling on the toe.  An open sore that forms on the top of the toe.  Not being able to straighten the toe.  How is this diagnosed? This condition is diagnosed based on your symptoms and a physical exam. During the exam, your health care provider will try to straighten your toe to see how stiff the deformity is. You may also have tests, such as:  A blood test to check for rheumatoid  arthritis.  An X-ray to show how severe the deformity is.  How is this treated? Treatment for this condition will depend on how stiff the deformity is. Surgery is often needed. However, sometimes a hammer toe can be straightened without surgery. Treatments that do not involve surgery include:  Taping the toe into a straightened position.  Using pads and cushions to protect the toe (orthotics).  Wearing shoes that provide enough room for the toes.  Doing toe-stretching exercises at home.  Taking an NSAID to reduce pain and swelling.  If these treatments do not help or the toe cannot be straightened, surgery is the next option. The most common surgeries used to straighten a hammer toe include:  Arthroplasty. In this procedure, part of the joint is removed, and that allows the toe to straighten.  Fusion. In this procedure, cartilage between the two bones of the joint is taken out and the bones are fused together into one longer bone.  Implantation. In this procedure, part of the bone is removed and replaced with an implant to let the toe move again.  Flexor tendon transfer. In this procedure, the tendons that curl the toes down (flexor tendons) are repositioned.  Follow these instructions at home:  Take over-the-counter and prescription medicines only as told by your health care provider.  Do toe straightening and stretching exercises as told by your health care provider.  Keep all   follow-up visits as told by your health care provider. This is important. How is this prevented?  Wear shoes that give your toes enough room and do not cause pain.  Do not wear high-heeled shoes. Contact a health care provider if:  Your pain gets worse.  Your toe becomes red or swollen.  You develop an open sore on your toe. This information is not intended to replace advice given to you by your health care provider. Make sure you discuss any questions you have with your health care  provider. Document Released: 05/12/2000 Document Revised: 12/03/2015 Document Reviewed: 09/08/2015 Elsevier Interactive Patient Education  2018 [employer]vier Inc.  

## 2017-06-27 NOTE — Progress Notes (Signed)
Subjective:   Patient ID: [name], female   DOB: 84 y.o.   MRN: [id number]   HPI 84 year old female presents the office today for concerns of pain to her left second toe.  She states she broke this several years ago when she gets a corn of the top of the toe.  She also gets similar symptoms in the right second toe but not as significant.  She does put a pad over the area.  She denies any recent injury.  She has no swelling or redness.  No drainage from the callus.  No other concerns.   Review of Systems  All other systems reviewed and are negative.  Past Medical History:  Diagnosis Date  . Breast cancer ([place])   . Diverticulosis   . Hiatal hernia   . Hypercholesteremia   . Hypertension   . Osteopenia   . Osteoporosis     Past Surgical History:  Procedure Laterality Date  . ABDOMINAL HYSTERECTOMY    . MASTECTOMY  1999   LEFT     Current Outpatient Medications:  .  ALPRAZolam (XANAX) 0.25 MG tablet, Take 1/2 tablet by mouth at bedtime as needed for anxiety (sleep), Disp: , Rfl:  .  Ascorbic Acid (VITAMIN C) 500 MG tablet, Take 500 mg by mouth daily.  , Disp: , Rfl:  .  aspirin EC 81 MG tablet, Take 1 tablet (81 mg total) by mouth daily., Disp: 90 tablet, Rfl: 3 .  atorvastatin (LIPITOR) 20 MG tablet, Take 20 mg by mouth daily., Disp: , Rfl: 3 .  Cholecalciferol (VITAMIN D) 1000 UNITS capsule, Take 1,000 Units by mouth daily.  , Disp: , Rfl:  .  Coenzyme Q10 (CO Q-10) 200 MG CAPS, Take 1 capsule by mouth daily. , Disp: , Rfl:  .  ezetimibe (ZETIA) 10 MG tablet, Take 10 mg by mouth daily., Disp: , Rfl: 2 .  Ibuprofen-Diphenhydramine Cit (ADVIL PM PO), Take 1 tablet by mouth at bedtime as needed (for arthritis). , Disp: , Rfl:  .  losartan (COZAAR) 25 MG tablet, Take 1 tablet (25 mg total) by mouth daily., Disp: 30 tablet, Rfl: 7 .  metoprolol succinate (TOPROL XL) 25 MG 24 hr tablet, Take 0.5 tablets (12.5 mg total) by mouth daily., Disp: 30 tablet, Rfl: 11 .  Multiple Vitamin  (MULTIVITAMIN) tablet, Take 1 tablet by mouth daily.  , Disp: , Rfl:  .  omeprazole (PRILOSEC) 20 MG capsule, Take 1 capsule (20 mg total) by mouth daily., Disp: 90 capsule, Rfl: 3 .  Psyllium (METAMUCIL PO), Take 1 Dose by mouth daily as needed (for constipation). , Disp: , Rfl:  .  raloxifene (EVISTA) 60 MG tablet, TAKE 1 TABLET BY MOUTH ONCE DAILY, Disp: 90 tablet, Rfl: 1 .  vitamin B-12 (CYANOCOBALAMIN) 500 MCG tablet, Take 500 mcg by mouth daily. , Disp: , Rfl:   No Known Allergies  Social History   Socioeconomic History  . Marital status: Single    Spouse name: Not on file  . Number of children: Not on file  . Years of education: Not on file  . Highest education level: Not on file  Social Needs  . Financial resource strain: Not on file  . Food insecurity - worry: Not on file  . Food insecurity - inability: Not on file  . Transportation needs - medical: Not on file  . Transportation needs - non-medical: Not on file  Occupational History  . Not on file  Tobacco Use  .  Smoking status: Never Smoker  . Smokeless tobacco: Never Used  Substance and Sexual Activity  . Alcohol use: Not on file  . Drug use: Not on file  . Sexual activity: Not on file  Other Topics Concern  . Not on file  Social History Narrative  . Not on file        Objective:  Physical Exam  General: AAO x3, NAD  Dermatological: Hyperkeratotic tissue dorsal left second PIPJ.  No underlying ulceration, drainage or any signs of infection were noted today.  Vascular: Dorsalis Pedis artery and Posterior Tibial artery pedal pulses are 2/4 bilateral with immedate capillary fill time. There is no pain with calf compression, swelling, warmth, erythema.   Neruologic: Grossly intact via light touch bilateral. Protective threshold with Semmes Wienstein monofilament intact to all pedal sites bilateral.   Musculoskeletal: Hammertoe deformities are present most notably the left second toe is semirigid.  No areas of  tenderness otherwise.  Muscular strength 5/5 in all groups tested bilateral.   Assessment:   Hyperkeratotic lesion left second toe due to underlying digital deformity     Plan:  -Treatment options discussed including all alternatives, risks, and complications -Etiology of symptoms were discussed -X-rays were obtained and reviewed with the patient.  Hammertoe contracture present.  No evidence of acute fracture identified -Debrided the hyperkeratotic tissue x1 without any complications or bleeding after the area was cleaned and verbal consent was obtained.  Offloading pads were dispensed.  Discussed change in shoes. -Follow-up as needed.  Call any questions or concerns.  She has no other concerns today.  She agrees with this.  Trula Slade DPM

## 2017-07-05 ENCOUNTER — Other Ambulatory Visit: Payer: Self-pay | Admitting: Cardiovascular Disease

## 2017-08-23 ENCOUNTER — Ambulatory Visit: Payer: Medicare Other | Admitting: Podiatry

## 2017-10-01 ENCOUNTER — Other Ambulatory Visit: Payer: Self-pay | Admitting: Cardiovascular Disease

## 2017-10-03 ENCOUNTER — Other Ambulatory Visit: Payer: Self-pay | Admitting: Cardiovascular Disease

## 2017-10-10 DIAGNOSIS — I10 Essential (primary) hypertension: Secondary | ICD-10-CM | POA: Diagnosis not present

## 2017-10-10 DIAGNOSIS — E1169 Type 2 diabetes mellitus with other specified complication: Secondary | ICD-10-CM | POA: Diagnosis not present

## 2017-10-10 DIAGNOSIS — M1990 Unspecified osteoarthritis, unspecified site: Secondary | ICD-10-CM | POA: Diagnosis not present

## 2017-10-10 DIAGNOSIS — Z6820 Body mass index (BMI) 20.0-20.9, adult: Secondary | ICD-10-CM | POA: Diagnosis not present

## 2017-10-10 DIAGNOSIS — M810 Age-related osteoporosis without current pathological fracture: Secondary | ICD-10-CM | POA: Diagnosis not present

## 2017-10-10 DIAGNOSIS — C50919 Malignant neoplasm of unspecified site of unspecified female breast: Secondary | ICD-10-CM | POA: Diagnosis not present

## 2017-10-24 DIAGNOSIS — H02831 Dermatochalasis of right upper eyelid: Secondary | ICD-10-CM | POA: Diagnosis not present

## 2017-10-24 DIAGNOSIS — H04123 Dry eye syndrome of bilateral lacrimal glands: Secondary | ICD-10-CM | POA: Diagnosis not present

## 2017-10-24 DIAGNOSIS — H02834 Dermatochalasis of left upper eyelid: Secondary | ICD-10-CM | POA: Diagnosis not present

## 2017-12-17 DIAGNOSIS — Z6820 Body mass index (BMI) 20.0-20.9, adult: Secondary | ICD-10-CM | POA: Diagnosis not present

## 2017-12-17 DIAGNOSIS — R1030 Lower abdominal pain, unspecified: Secondary | ICD-10-CM | POA: Diagnosis not present

## 2017-12-17 DIAGNOSIS — K5792 Diverticulitis of intestine, part unspecified, without perforation or abscess without bleeding: Secondary | ICD-10-CM | POA: Diagnosis not present

## 2017-12-17 DIAGNOSIS — D72829 Elevated white blood cell count, unspecified: Secondary | ICD-10-CM | POA: Diagnosis not present

## 2017-12-17 DIAGNOSIS — R000 Tachycardia, unspecified: Secondary | ICD-10-CM | POA: Diagnosis not present

## 2017-12-19 ENCOUNTER — Other Ambulatory Visit: Payer: Self-pay | Admitting: Adult Health

## 2017-12-19 ENCOUNTER — Inpatient Hospital Stay (HOSPITAL_COMMUNITY)
Admission: EM | Admit: 2017-12-19 | Discharge: 2017-12-28 | DRG: 392 | Disposition: A | Discharge status: Home Health | Payer: Medicare Other | Attending: Internal Medicine | Admitting: Internal Medicine

## 2017-12-19 ENCOUNTER — Other Ambulatory Visit: Payer: Self-pay

## 2017-12-19 ENCOUNTER — Ambulatory Visit
Admission: RE | Admit: 2017-12-19 | Discharge: 2017-12-19 | Disposition: A | Discharge status: Home / Self Care | Payer: Medicare Other | Source: Ambulatory Visit | Attending: Adult Health | Admitting: Adult Health

## 2017-12-19 ENCOUNTER — Encounter (HOSPITAL_COMMUNITY): Payer: Self-pay | Admitting: *Deleted

## 2017-12-19 DIAGNOSIS — K449 Diaphragmatic hernia without obstruction or gangrene: Secondary | ICD-10-CM | POA: Diagnosis present

## 2017-12-19 DIAGNOSIS — E860 Dehydration: Secondary | ICD-10-CM | POA: Diagnosis present

## 2017-12-19 DIAGNOSIS — Z823 Family history of stroke: Secondary | ICD-10-CM

## 2017-12-19 DIAGNOSIS — K862 Cyst of pancreas: Secondary | ICD-10-CM | POA: Diagnosis present

## 2017-12-19 DIAGNOSIS — K5730 Diverticulosis of large intestine without perforation or abscess without bleeding: Secondary | ICD-10-CM

## 2017-12-19 DIAGNOSIS — Z8249 Family history of ischemic heart disease and other diseases of the circulatory system: Secondary | ICD-10-CM | POA: Diagnosis not present

## 2017-12-19 DIAGNOSIS — Z853 Personal history of malignant neoplasm of breast: Secondary | ICD-10-CM

## 2017-12-19 DIAGNOSIS — K219 Gastro-esophageal reflux disease without esophagitis: Secondary | ICD-10-CM | POA: Diagnosis present

## 2017-12-19 DIAGNOSIS — F419 Anxiety disorder, unspecified: Secondary | ICD-10-CM | POA: Diagnosis present

## 2017-12-19 DIAGNOSIS — R1030 Lower abdominal pain, unspecified: Secondary | ICD-10-CM | POA: Diagnosis not present

## 2017-12-19 DIAGNOSIS — K5720 Diverticulitis of large intestine with perforation and abscess without bleeding: Secondary | ICD-10-CM | POA: Diagnosis not present

## 2017-12-19 DIAGNOSIS — Z9012 Acquired absence of left breast and nipple: Secondary | ICD-10-CM

## 2017-12-19 DIAGNOSIS — N182 Chronic kidney disease, stage 2 (mild): Secondary | ICD-10-CM | POA: Diagnosis present

## 2017-12-19 DIAGNOSIS — E785 Hyperlipidemia, unspecified: Secondary | ICD-10-CM | POA: Diagnosis present

## 2017-12-19 DIAGNOSIS — Z681 Body mass index (BMI) 19 or less, adult: Secondary | ICD-10-CM

## 2017-12-19 DIAGNOSIS — R739 Hyperglycemia, unspecified: Secondary | ICD-10-CM

## 2017-12-19 DIAGNOSIS — M810 Age-related osteoporosis without current pathological fracture: Secondary | ICD-10-CM | POA: Diagnosis present

## 2017-12-19 DIAGNOSIS — Z87898 Personal history of other specified conditions: Secondary | ICD-10-CM | POA: Diagnosis not present

## 2017-12-19 DIAGNOSIS — E7800 Pure hypercholesterolemia, unspecified: Secondary | ICD-10-CM | POA: Diagnosis present

## 2017-12-19 DIAGNOSIS — R52 Pain, unspecified: Secondary | ICD-10-CM

## 2017-12-19 DIAGNOSIS — I129 Hypertensive chronic kidney disease with stage 1 through stage 4 chronic kidney disease, or unspecified chronic kidney disease: Secondary | ICD-10-CM | POA: Diagnosis present

## 2017-12-19 DIAGNOSIS — Z7982 Long term (current) use of aspirin: Secondary | ICD-10-CM

## 2017-12-19 DIAGNOSIS — E440 Moderate protein-calorie malnutrition: Secondary | ICD-10-CM | POA: Diagnosis present

## 2017-12-19 DIAGNOSIS — T39315A Adverse effect of propionic acid derivatives, initial encounter: Secondary | ICD-10-CM | POA: Diagnosis present

## 2017-12-19 DIAGNOSIS — N179 Acute kidney failure, unspecified: Secondary | ICD-10-CM | POA: Diagnosis present

## 2017-12-19 DIAGNOSIS — A419 Sepsis, unspecified organism: Secondary | ICD-10-CM | POA: Diagnosis present

## 2017-12-19 DIAGNOSIS — T465X5A Adverse effect of other antihypertensive drugs, initial encounter: Secondary | ICD-10-CM | POA: Diagnosis present

## 2017-12-19 DIAGNOSIS — Z8261 Family history of arthritis: Secondary | ICD-10-CM | POA: Diagnosis not present

## 2017-12-19 DIAGNOSIS — K5792 Diverticulitis of intestine, part unspecified, without perforation or abscess without bleeding: Secondary | ICD-10-CM

## 2017-12-19 DIAGNOSIS — J189 Pneumonia, unspecified organism: Secondary | ICD-10-CM | POA: Diagnosis present

## 2017-12-19 DIAGNOSIS — Z90710 Acquired absence of both cervix and uterus: Secondary | ICD-10-CM

## 2017-12-19 DIAGNOSIS — D631 Anemia in chronic kidney disease: Secondary | ICD-10-CM | POA: Diagnosis present

## 2017-12-19 DIAGNOSIS — E1165 Type 2 diabetes mellitus with hyperglycemia: Secondary | ICD-10-CM | POA: Diagnosis present

## 2017-12-19 DIAGNOSIS — E1129 Type 2 diabetes mellitus with other diabetic kidney complication: Secondary | ICD-10-CM | POA: Diagnosis present

## 2017-12-19 DIAGNOSIS — D72829 Elevated white blood cell count, unspecified: Secondary | ICD-10-CM | POA: Diagnosis not present

## 2017-12-19 DIAGNOSIS — I10 Essential (primary) hypertension: Secondary | ICD-10-CM | POA: Diagnosis not present

## 2017-12-19 DIAGNOSIS — R000 Tachycardia, unspecified: Secondary | ICD-10-CM | POA: Diagnosis not present

## 2017-12-19 LAB — COMPREHENSIVE METABOLIC PANEL
ALK PHOS: 55 U/L (ref 38–126)
ALT: 21 U/L (ref 0–44)
ALT: 18 U/L (ref 0–44)
AST: 25 U/L (ref 15–41)
AST: 19 U/L (ref 15–41)
Albumin: 3.2 g/dL — ABNORMAL LOW (ref 3.5–5.0)
Albumin: 3 g/dL — ABNORMAL LOW (ref 3.5–5.0)
Alkaline Phosphatase: 61 U/L (ref 38–126)
Anion gap: 12 (ref 5–15)
Anion gap: 9 (ref 5–15)
BILIRUBIN TOTAL: 0.3 mg/dL (ref 0.3–1.2)
BUN: 12 mg/dL (ref 8–23)
BUN: 10 mg/dL (ref 8–23)
CALCIUM: 8.6 mg/dL — AB (ref 8.9–10.3)
CHLORIDE: 107 mmol/L (ref 98–111)
CO2: 22 mmol/L (ref 22–32)
CO2: 23 mmol/L (ref 22–32)
CREATININE: 1.05 mg/dL — AB (ref 0.44–1.00)
Calcium: 9 mg/dL (ref 8.9–10.3)
Chloride: 98 mmol/L (ref 98–111)
Creatinine, Ser: 1.22 mg/dL — ABNORMAL HIGH (ref 0.44–1.00)
GFR calc Af Amer: 45 mL/min — ABNORMAL LOW (ref >60)
GFR calc non Af Amer: 39 mL/min — ABNORMAL LOW (ref >60)
GFR, EST AFRICAN AMERICAN: 54 mL/min — AB (ref >60)
GFR, EST NON AFRICAN AMERICAN: 47 mL/min — AB (ref >60)
Glucose, Bld: 390 mg/dL — ABNORMAL HIGH (ref 70–99)
Glucose, Bld: 118 mg/dL — ABNORMAL HIGH (ref 70–99)
Potassium: 4.8 mmol/L (ref 3.5–5.1)
Potassium: 4 mmol/L (ref 3.5–5.1)
Sodium: 132 mmol/L — ABNORMAL LOW (ref 135–145)
Sodium: 139 mmol/L (ref 135–145)
TOTAL PROTEIN: 5.8 g/dL — AB (ref 6.5–8.1)
Total Bilirubin: 0.4 mg/dL (ref 0.3–1.2)
Total Protein: 6.6 g/dL (ref 6.5–8.1)

## 2017-12-19 LAB — URINALYSIS, ROUTINE W REFLEX MICROSCOPIC
Bilirubin Urine: NEGATIVE
Glucose, UA: 50 mg/dL — AB
Hgb urine dipstick: NEGATIVE
Ketones, ur: NEGATIVE mg/dL
Nitrite: NEGATIVE
Protein, ur: NEGATIVE mg/dL
Specific Gravity, Urine: 1.013 (ref 1.005–1.030)
pH: 8 (ref 5.0–8.0)

## 2017-12-19 LAB — CBC
HCT: 39 % (ref 36.0–46.0)
Hemoglobin: 12.1 g/dL (ref 12.0–15.0)
MCH: 28.1 pg (ref 26.0–34.0)
MCHC: 31 g/dL (ref 30.0–36.0)
MCV: 90.5 fL (ref 78.0–100.0)
Platelets: 427 10*3/uL — ABNORMAL HIGH (ref 150–400)
RBC: 4.31 MIL/uL (ref 3.87–5.11)
RDW: 12.5 % (ref 11.5–15.5)
WBC: 15.9 10*3/uL — ABNORMAL HIGH (ref 4.0–10.5)

## 2017-12-19 LAB — PROCALCITONIN

## 2017-12-19 LAB — TYPE AND SCREEN
ABO/RH(D): O POS
Antibody Screen: NEGATIVE

## 2017-12-19 LAB — GLUCOSE, CAPILLARY: Glucose-Capillary: 96 mg/dL (ref 70–99)

## 2017-12-19 LAB — BRAIN NATRIURETIC PEPTIDE: B Natriuretic Peptide: 113.1 pg/mL — ABNORMAL HIGH (ref 0.0–100.0)

## 2017-12-19 LAB — ABO/RH: ABO/RH(D): O POS

## 2017-12-19 LAB — PROTIME-INR
INR: 1.19
Prothrombin Time: 15 seconds (ref 11.4–15.2)

## 2017-12-19 LAB — LACTIC ACID, PLASMA: Lactic Acid, Venous: 0.9 mmol/L (ref 0.5–1.9)

## 2017-12-19 LAB — APTT: aPTT: 35 seconds (ref 24–36)

## 2017-12-19 LAB — LIPASE, BLOOD: Lipase: 39 U/L (ref 11–51)

## 2017-12-19 MED ORDER — ZOLPIDEM TARTRATE 5 MG PO TABS
5.0000 mg | ORAL_TABLET | Freq: Every evening | ORAL | Status: DC | PRN
  Administered 2017-12-19 – 2017-12-27 (×8): 5 mg via ORAL
  Filled 2017-12-19 (×9): qty 1

## 2017-12-19 MED ORDER — EZETIMIBE 10 MG PO TABS
10.0000 mg | ORAL_TABLET | Freq: Every day | ORAL | Status: DC
  Administered 2017-12-20 – 2017-12-28 (×6): 10 mg via ORAL
  Filled 2017-12-19 (×9): qty 1

## 2017-12-19 MED ORDER — ATORVASTATIN CALCIUM 20 MG PO TABS
20.0000 mg | ORAL_TABLET | Freq: Every day | ORAL | Status: DC
  Administered 2017-12-20 – 2017-12-28 (×9): 20 mg via ORAL
  Filled 2017-12-19 (×9): qty 1

## 2017-12-19 MED ORDER — VITAMIN B-12 1000 MCG PO TABS
500.0000 ug | ORAL_TABLET | Freq: Every day | ORAL | Status: DC
  Administered 2017-12-20 – 2017-12-28 (×8): 500 ug via ORAL
  Filled 2017-12-19 (×10): qty 1

## 2017-12-19 MED ORDER — VITAMIN C 500 MG PO TABS
500.0000 mg | ORAL_TABLET | Freq: Every day | ORAL | Status: DC
  Administered 2017-12-20 – 2017-12-28 (×8): 500 mg via ORAL
  Filled 2017-12-19 (×9): qty 1

## 2017-12-19 MED ORDER — SODIUM CHLORIDE 0.9 % IV SOLN
INTRAVENOUS | Status: DC
  Administered 2017-12-19 – 2017-12-20 (×2): via INTRAVENOUS

## 2017-12-19 MED ORDER — ALPRAZOLAM 0.25 MG PO TABS
0.1250 mg | ORAL_TABLET | Freq: Every evening | ORAL | Status: DC | PRN
  Administered 2017-12-19 – 2017-12-27 (×9): 0.125 mg via ORAL
  Filled 2017-12-19 (×9): qty 1

## 2017-12-19 MED ORDER — RALOXIFENE HCL 60 MG PO TABS
60.0000 mg | ORAL_TABLET | Freq: Every day | ORAL | Status: DC
  Administered 2017-12-20 – 2017-12-28 (×8): 60 mg via ORAL
  Filled 2017-12-19 (×9): qty 1

## 2017-12-19 MED ORDER — PIPERACILLIN-TAZOBACTAM 3.375 G IVPB
3.3750 g | Freq: Two times a day (BID) | INTRAVENOUS | Status: DC
  Administered 2017-12-20 – 2017-12-28 (×17): 3.375 g via INTRAVENOUS
  Filled 2017-12-19 (×13): qty 50

## 2017-12-19 MED ORDER — INSULIN GLARGINE 100 UNIT/ML ~~LOC~~ SOLN
3.0000 [IU] | Freq: Every day | SUBCUTANEOUS | Status: DC
Start: 2017-12-19 — End: 2017-12-20
  Filled 2017-12-19: qty 0.03

## 2017-12-19 MED ORDER — ONDANSETRON HCL 4 MG PO TABS: 4.0000 mg | ORAL_TABLET | Freq: Four times a day (QID) | ORAL | Status: DC | PRN

## 2017-12-19 MED ORDER — IOPAMIDOL (ISOVUE-370) INJECTION 76%
80.0000 mL | Freq: Once | INTRAVENOUS | Status: AC | PRN
  Administered 2017-12-19: 80 mL via INTRAVENOUS

## 2017-12-19 MED ORDER — ACETAMINOPHEN 325 MG PO TABS
650.0000 mg | ORAL_TABLET | Freq: Four times a day (QID) | ORAL | Status: DC | PRN
  Administered 2017-12-26: 650 mg via ORAL
  Filled 2017-12-19: qty 2

## 2017-12-19 MED ORDER — VITAMIN D 1000 UNITS PO TABS
1000.0000 [IU] | ORAL_TABLET | Freq: Every day | ORAL | Status: DC
  Administered 2017-12-20 – 2017-12-28 (×9): 1000 [IU] via ORAL
  Filled 2017-12-19 (×9): qty 1

## 2017-12-19 MED ORDER — ACETAMINOPHEN 650 MG RE SUPP: 650.0000 mg | Freq: Four times a day (QID) | RECTAL | Status: DC | PRN

## 2017-12-19 MED ORDER — INSULIN ASPART 100 UNIT/ML ~~LOC~~ SOLN
0.0000 [IU] | Freq: Three times a day (TID) | SUBCUTANEOUS | Status: DC
  Administered 2017-12-20 – 2017-12-21 (×4): 1 [IU] via SUBCUTANEOUS
  Administered 2017-12-22: 2 [IU] via SUBCUTANEOUS
  Administered 2017-12-22: 3 [IU] via SUBCUTANEOUS
  Administered 2017-12-23: 5 [IU] via SUBCUTANEOUS
  Administered 2017-12-24 (×2): 1 [IU] via SUBCUTANEOUS
  Administered 2017-12-25: 2 [IU] via SUBCUTANEOUS
  Administered 2017-12-25 – 2017-12-27 (×3): 1 [IU] via SUBCUTANEOUS
  Administered 2017-12-28: 3 [IU] via SUBCUTANEOUS

## 2017-12-19 MED ORDER — METOPROLOL SUCCINATE 12.5 MG HALF TABLET
12.5000 mg | ORAL_TABLET | Freq: Every day | ORAL | Status: DC
  Administered 2017-12-20 – 2017-12-28 (×8): 12.5 mg via ORAL
  Filled 2017-12-19 (×9): qty 1

## 2017-12-19 MED ORDER — AMLODIPINE BESYLATE 5 MG PO TABS
5.0000 mg | ORAL_TABLET | Freq: Every day | ORAL | Status: DC
  Administered 2017-12-19 – 2017-12-28 (×10): 5 mg via ORAL
  Filled 2017-12-19 (×10): qty 1

## 2017-12-19 MED ORDER — ONDANSETRON HCL 4 MG/2ML IJ SOLN
4.0000 mg | Freq: Four times a day (QID) | INTRAMUSCULAR | Status: DC | PRN
  Administered 2017-12-20 – 2017-12-21 (×2): 4 mg via INTRAVENOUS
  Filled 2017-12-19 (×2): qty 2

## 2017-12-19 MED ORDER — MORPHINE SULFATE (PF) 4 MG/ML IV SOLN: 1.0000 mg | INTRAVENOUS | Status: DC | PRN

## 2017-12-19 MED ORDER — CO Q-10 200 MG PO CAPS: 1.0000 | ORAL_CAPSULE | Freq: Every day | ORAL | Status: DC

## 2017-12-19 MED ORDER — PANTOPRAZOLE SODIUM 40 MG PO TBEC
40.0000 mg | DELAYED_RELEASE_TABLET | Freq: Every day | ORAL | Status: DC
  Administered 2017-12-20 – 2017-12-28 (×9): 40 mg via ORAL
  Filled 2017-12-19 (×9): qty 1

## 2017-12-19 MED ORDER — PSYLLIUM 95 % PO PACK
1.0000 | PACK | Freq: Every day | ORAL | Status: DC
  Administered 2017-12-21 – 2017-12-27 (×6): 1 via ORAL
  Filled 2017-12-19 (×9): qty 1

## 2017-12-19 MED ORDER — HYDRALAZINE HCL 20 MG/ML IJ SOLN: 5.0000 mg | INTRAMUSCULAR | Status: DC | PRN

## 2017-12-19 MED ORDER — ADULT MULTIVITAMIN W/MINERALS CH
1.0000 | ORAL_TABLET | Freq: Every day | ORAL | Status: DC
  Administered 2017-12-20 – 2017-12-28 (×8): 1 via ORAL
  Filled 2017-12-19 (×9): qty 1

## 2017-12-19 MED ORDER — SODIUM CHLORIDE 0.9 % IV BOLUS
1000.0000 mL | Freq: Once | INTRAVENOUS | Status: AC
  Administered 2017-12-19: 1000 mL via INTRAVENOUS

## 2017-12-19 MED ORDER — PIPERACILLIN-TAZOBACTAM 3.375 G IVPB 30 MIN
3.3750 g | Freq: Once | INTRAVENOUS | Status: AC
  Administered 2017-12-19: 3.375 g via INTRAVENOUS
  Filled 2017-12-19: qty 50

## 2017-12-19 NOTE — ED Triage Notes (Signed)
Pt in from her MD office, pt is being treated for diverticulitis, had follow up today and due to elevated WBC she was sent for a CT scan at [facility] imaging which indicated an abscess, sent here for further evaluation

## 2017-12-19 NOTE — ED Provider Notes (Signed)
[facility] EMERGENCY DEPARTMENT Provider Note   CSN: [id number] Arrival date & time: 12/19/17  1543     History   Chief Complaint Chief Complaint  Patient presents with  . Abdominal Pain    HPI ROSALEAH [name] is a 84 y.o. female.  HPI [name] is a 83 y.o. female with history of hypertension, presents to emergency department with complaint of abdominal pain.  Patient states she has had left lower side abdominal pain for about a month.  States pain has been coming and going, but well controlled with Pepto-Bismol.  She states that she finally went to her PCP 2 days ago and had a CT scan done outpatient today.  She was called and told to come to emergency department because she had acute diverticulitis with an abscess.  Patient reports subjective fever at nighttime.  She reports no nausea or vomiting.  No diarrhea.  She denies any pain at this time unless you push on her abdomen or when she is moving.  She denies any prior history of diverticulitis but states she was told she had diverticulosis.  She has had hysterectomy, no other abdominal surgeries.  Past Medical History:  Diagnosis Date  . Breast cancer ([place])   . Diverticulosis   . Hiatal hernia   . Hypercholesteremia   . Hypertension   . Osteopenia   . Osteoporosis     Patient Active Problem List   Diagnosis Date Noted  . Aortic systolic murmur on examination 04/08/2015  . Malaise and fatigue 04/15/2012  . Right carotid bruit 10/05/2011  . Hiatal hernia 04/05/2011  . Benign hypertensive heart disease without heart failure 09/01/2010  . Hypercholesterolemia 09/01/2010  . History of breast cancer 09/01/2010  . Osteopenia 09/01/2010    Past Surgical History:  Procedure Laterality Date  . ABDOMINAL HYSTERECTOMY    . MASTECTOMY  1999   LEFT     OB History   None      Home Medications    Prior to Admission medications   Medication Sig Start Date End Date Taking? Authorizing Provider    ALPRAZolam Duanne Moron) 0.25 MG tablet Take 1/2 tablet by mouth at bedtime as needed for anxiety (sleep)    [provider]  Ascorbic Acid (VITAMIN C) 500 MG tablet Take 500 mg by mouth daily.      [provider]  aspirin EC 81 MG tablet Take 1 tablet (81 mg total) by mouth daily. 10/18/16   Richardson Dopp T, PA-C  atorvastatin (LIPITOR) 20 MG tablet Take 20 mg by mouth daily. 03/02/16   [provider]  Cholecalciferol (VITAMIN D) 1000 UNITS capsule Take 1,000 Units by mouth daily.      [provider]  Coenzyme Q10 (CO Q-10) 200 MG CAPS Take 1 capsule by mouth daily.     [provider]  ezetimibe (ZETIA) 10 MG tablet Take 10 mg by mouth daily. 02/26/16   [provider]  Ibuprofen-Diphenhydramine Cit (ADVIL PM PO) Take 1 tablet by mouth at bedtime as needed (for arthritis).     [provider]  losartan (COZAAR) 25 MG tablet Take 1 tablet (25 mg total) by mouth daily. 04/30/17   Nahser, Wonda Cheng, MD  metoprolol succinate (TOPROL-XL) 25 MG 24 hr tablet TAKE 1/2 TABLET BY MOUTH DAILY 10/01/17   Nahser, Wonda Cheng, MD  Multiple Vitamin (MULTIVITAMIN) tablet Take 1 tablet by mouth daily.      [provider]  omeprazole (PRILOSEC) 20 MG  capsule Take 1 capsule (20 mg total) by mouth daily. 05/27/15   Darlin Coco, MD  Psyllium (METAMUCIL PO) Take 1 Dose by mouth daily as needed (for constipation).     [provider]  raloxifene (EVISTA) 60 MG tablet TAKE 1 TABLET BY MOUTH ONCE DAILY 12/23/14   Darlin Coco, MD  vitamin B-12 (CYANOCOBALAMIN) 500 MCG tablet Take 500 mcg by mouth daily.     [provider]    Family History Family History  Problem Relation Age of Onset  . Arthritis Father   . Stroke Father   . Hypertension Mother     Social History Social History   Tobacco Use  . Smoking status: Never Smoker  . Smokeless tobacco: Never Used  Substance Use Topics  . Alcohol use: Not on file  . Drug  use: Not on file     Allergies   Patient has no known allergies.   Review of Systems Review of Systems  Constitutional: Positive for chills and fever.  Respiratory: Negative for cough, chest tightness and shortness of breath.   Cardiovascular: Negative for chest pain, palpitations and leg swelling.  Gastrointestinal: Positive for abdominal pain. Negative for diarrhea, nausea and vomiting.  Genitourinary: Negative for dysuria, flank pain, pelvic pain, vaginal bleeding, vaginal discharge and vaginal pain.  Musculoskeletal: Negative for arthralgias, myalgias, neck pain and neck stiffness.  Skin: Negative for rash.  Neurological: Negative for dizziness, weakness and headaches.  All other systems reviewed and are negative.    Physical Exam Updated Vital Signs BP (!) 156/72   Pulse 91   Temp 98.6 F (37 C) (Oral)   Resp (!) 21   Ht 4\' 10"  (1.473 m)   Wt 42.2 kg (93 lb)   SpO2 100%   BMI 19.44 kg/m   Physical Exam  Constitutional: She appears well-developed and well-nourished. No distress.  HENT:  Head: Normocephalic.  Eyes: Conjunctivae are normal.  Neck: Neck supple.  Cardiovascular: Normal rate, regular rhythm and normal heart sounds.  Pulmonary/Chest: Effort normal and breath sounds normal. No respiratory distress. She has no wheezes. She has no rales.  Abdominal: Soft. Bowel sounds are normal. She exhibits no distension. There is tenderness in the left lower quadrant. There is guarding. There is no rigidity and no rebound.  Musculoskeletal: She exhibits no edema.  Neurological: She is alert.  Skin: Skin is warm and dry.  Psychiatric: She has a normal mood and affect. Her behavior is normal.  Nursing note and vitals reviewed.    ED Treatments / Results  Labs (all labs ordered are listed, but only abnormal results are displayed) Labs Reviewed  COMPREHENSIVE METABOLIC PANEL - Abnormal; Notable for the following components:      Result Value   Sodium 132 (*)     Glucose, Bld 390 (*)    Creatinine, Ser 1.22 (*)    Albumin 3.2 (*)    GFR calc non Af Amer 39 (*)    GFR calc Af Amer 45 (*)    All other components within normal limits  CBC - Abnormal; Notable for the following components:   WBC 15.9 (*)    Platelets 427 (*)    All other components within normal limits  LIPASE, BLOOD  URINALYSIS, ROUTINE W REFLEX MICROSCOPIC    EKG None  Radiology Ct Abdomen Pelvis W Contrast  Result Date: 12/19/2017 CLINICAL DATA:  One month history of LEFT LOWER QUADRANT abdominal pain. Patient is being treated for diverticulitis, and presents with leukocytosis. Personal history  of LEFT breast cancer post mastectomy in 1999. Surgical history also includes abdominal hysterectomy. Creatinine was obtained on site at [facility] at [address] Results: Creatinine 1.0 mg/dL.  BUN 18.  Estimated GFR 51. EXAM: CT ABDOMEN AND PELVIS WITH CONTRAST TECHNIQUE: Multidetector CT imaging of the abdomen and pelvis was performed using the standard protocol following bolus administration of intravenous contrast. CONTRAST:  22mL [id number] IOPAMIDOL INJECTION 76% IV. Oral contrast was not administered. COMPARISON:  10/05/2003. FINDINGS: Lower chest: Mild bronchiectasis involving the RIGHT LOWER LOBE. Visualized lung bases clear. Heart size normal. Hepatobiliary: Liver normal in size and appearance. Gallbladder normal in appearance without calcified gallstones. No biliary ductal dilation. Pancreas: Scattered pancreatic calcifications. 8 mm cystic mass arising from the proximal body of the pancreas (series 2, image 18 and coronal image 32, best seen on the coronal image). No other pancreatic masses. Spleen: Normal in size and appearance. Adrenals/Urinary Tract: Normal appearing adrenal glands. Kidneys normal in size and appearance without focal parenchymal abnormality. No evidence of urinary tract calculi or obstruction. Normal-appearing decompressed urinary bladder.  Stomach/Bowel: Moderate to large hiatal hernia, not significantly changed since the 2005 examination. Stomach otherwise normal in appearance. Normal-appearing small bowel. Opaque ingested material in the terminal ileum and cecum. Extensive sigmoid colon diverticulosis and scattered diverticula involving the distal descending colon. Acute diverticulitis involving the proximal sigmoid colon. Approximate 4 cm gas containing fluid collection adjacent to the proximal sigmoid colon. No free intraperitoneal air. Normal appearing decompressed appendix in the RIGHT UPPER pelvis. Vascular/Lymphatic: Moderate aortoiliac atherosclerosis without evidence of aneurysm. Normal-appearing portal venous and systemic venous systems. No pathologic lymphadenopathy. Reproductive: Surgically absent uterus.  No adnexal masses. Other: None. Musculoskeletal: Severe degenerative disc disease and spondylosis at L1-2, L2-3, L3-4 and L4-5. Diffuse facet degenerative changes throughout the lumbar spine. Thoracolumbar levoscoliosis. No acute findings. IMPRESSION: 1. Acute/subacute sigmoid diverticulitis with an associated approximate 4 cm abscess adjacent to the proximal sigmoid colon. 2. 8 mm cystic mass involving the proximal body of the pancreas. Recommend follow up pre and post contrast MRI/MRCP or pancreatic protocol CT in 2 years. This recommendation follows ACR consensus guidelines: Management of Incidental Pancreatic Cysts: A White Paper of the ACR Incidental Findings Committee. J Am Coll Radiol 7408;14:481-856. 3. Moderate to large hiatal hernia. 4. RIGHT LOWER LOBE bronchiectasis. 5.  Aortic Atherosclerosis (ICD10-170.0) These results will be called to the ordering clinician or representative by the Radiologist Assistant, and communication documented in the PACS or zVision Dashboard. Electronically Signed   By: Evangeline Dakin M.D.   On: 12/19/2017 12:28    Procedures Procedures (including critical care time)  Medications Ordered  in ED Medications  piperacillin-tazobactam (ZOSYN) IVPB 3.375 g (3.375 g Intravenous New Bag/Given 12/19/17 1759)  sodium chloride 0.9 % bolus 1,000 mL (1,000 mLs Intravenous New Bag/Given 12/19/17 1806)     Initial Impression / Assessment and Plan / ED Course  I have reviewed the triage vital signs and the nursing notes.  Pertinent labs & imaging results that were available during my care of the patient were reviewed by me and considered in my medical decision making (see chart for details).     Pt in ED with LLQ pain for 1 month. Outpatient CT showed acute/subacute sigmoid diverticuluitis with 4cm abscess. Pt's blood work shows also elevated glucose of 390. Will give IV fliuds and start antibiotics. Will call general sugery  6:51 PM I spoke with Dr. Hulen Skains with general surgery.  Asked to admit to medicine, they will consult.  Spoke with medicine, will admit  Vitals:   12/19/17 2100 12/19/17 2115 12/19/17 2144 12/19/17 2205  BP: 129/65 134/69 (!) 157/73   Pulse: 86 88 93   Resp:   16   Temp:    98.1 F (36.7 C)  TempSrc:    Oral  SpO2: 98% 98% 99%   Weight:   42.2 kg (93 lb 0.6 oz)   Height:   4\' 10"  (1.473 m)      Final Clinical Impressions(s) / ED Diagnoses   Final diagnoses:  Diverticulitis  Hyperglycemia    ED Discharge Orders    None       Jeannett Senior, PA-C 12/20/17 0047    Virgel Manifold, MD 12/20/17 [phone]

## 2017-12-19 NOTE — Consult Note (Signed)
Reason for Consult:Diverticulitis with abscess Referring Physician: [name] is an 84 y.o. female.  HPI: 56 year ol female with three day history of abdominal discomfort as she states and low grade fever.  No nausea or vomiting.  No chills.  Has been having normal bowel movements and had one after coming to the hospital.  CT scan of the abdomen demonstrated acute diverticulitis with 4 cm abscess.  Past Medical History:  Diagnosis Date  . Breast cancer ([place])   . Diverticulosis   . Hiatal hernia   . Hypercholesteremia   . Hypertension   . Osteopenia   . Osteoporosis     Past Surgical History:  Procedure Laterality Date  . ABDOMINAL HYSTERECTOMY    . MASTECTOMY  1999   LEFT    Family History  Problem Relation Age of Onset  . Arthritis Father   . Stroke Father   . Hypertension Mother     Social History:  reports that she has never smoked. She has never used smokeless tobacco. Her alcohol and drug histories are not on file.  Allergies: No Known Allergies  Medications: I have reviewed the patient's current medications.  Results for orders placed or performed during the hospital encounter of 12/19/17 (from the past 48 hour(s))  Lipase, blood     Status: None   Collection Time: 12/19/17  3:58 PM  Result Value Ref Range   Lipase 39 11 - 51 U/L    Comment: Performed at [facility], [place] [address], [place], [place] [address]  Comprehensive metabolic panel     Status: Abnormal   Collection Time: 12/19/17  3:58 PM  Result Value Ref Range   Sodium 132 (L) 135 - 145 mmol/L   Potassium 4.8 3.5 - 5.1 mmol/L   Chloride 98 98 - 111 mmol/L   CO2 22 22 - 32 mmol/L   Glucose, Bld 390 (H) 70 - 99 mg/dL   BUN 12 8 - 23 mg/dL   Creatinine, Ser 1.22 (H) 0.44 - 1.00 mg/dL   Calcium 9.0 8.9 - 10.3 mg/dL   Total Protein 6.6 6.5 - 8.1 g/dL   Albumin 3.2 (L) 3.5 - 5.0 g/dL   AST 25 15 - 41 U/L   ALT 21 0 - 44 U/L   Alkaline Phosphatase 61 38 - 126 U/L   Total Bilirubin 0.4  0.3 - 1.2 mg/dL   GFR calc non Af Amer 39 (L) >60 mL/min   GFR calc Af Amer 45 (L) >60 mL/min    Comment: (NOTE) The eGFR has been calculated using the CKD EPI equation. This calculation has not been validated in all clinical situations. eGFR's persistently <60 mL/min signify possible Chronic Kidney Disease.    Anion gap 12 5 - 15    Comment: Performed at [facility] [address] [place], [place] [address]  CBC     Status: Abnormal   Collection Time: 12/19/17  3:58 PM  Result Value Ref Range   WBC 15.9 (H) 4.0 - 10.5 K/uL   RBC 4.31 3.87 - 5.11 MIL/uL   Hemoglobin 12.1 12.0 - 15.0 g/dL   HCT 39.0 36.0 - 46.0 %   MCV 90.5 78.0 - 100.0 fL   MCH 28.1 26.0 - 34.0 pg   MCHC 31.0 30.0 - 36.0 g/dL   RDW 12.5 11.5 - 15.5 %   Platelets 427 (H) 150 - 400 K/uL    Comment: Performed at [facility]  [address], [place], [place] [address]  Urinalysis, Routine w reflex microscopic     Status: Abnormal   Collection Time: 12/19/17  6:54 PM  Result Value Ref Range   Color, Urine STRAW (A) YELLOW   APPearance CLEAR CLEAR   Specific Gravity, Urine 1.013 1.005 - 1.030   pH 8.0 5.0 - 8.0   Glucose, UA 50 (A) NEGATIVE mg/dL   Hgb urine dipstick NEGATIVE NEGATIVE   Bilirubin Urine NEGATIVE NEGATIVE   Ketones, ur NEGATIVE NEGATIVE mg/dL   Protein, ur NEGATIVE NEGATIVE mg/dL   Nitrite NEGATIVE NEGATIVE   Leukocytes, UA SMALL (A) NEGATIVE   RBC / HPF 0-5 0 - 5 RBC/hpf   WBC, UA 6-10 0 - 5 WBC/hpf   Bacteria, UA RARE (A) NONE SEEN   Squamous Epithelial / LPF 0-5 0 - 5   Mucus PRESENT     Comment: Performed at [facility], [place] [address] [place], [place] [address]  Type and screen [place]     Status: None (Preliminary result)   Collection Time: 12/19/17  9:20 PM  Result Value Ref Range   ABO/RH(D) O POS    Antibody Screen PENDING    Sample Expiration      12/22/2017 Performed at [facility], [place] [address] [place],  [address]      Ct Abdomen Pelvis W Contrast  Result Date: 12/19/2017 CLINICAL DATA:  One month history of LEFT LOWER QUADRANT abdominal pain. Patient is being treated for diverticulitis, and presents with leukocytosis. Personal history of LEFT breast cancer post mastectomy in 1999. Surgical history also includes abdominal hysterectomy. Creatinine was obtained on site at [facility] at [address] Results: Creatinine 1.0 mg/dL.  BUN 18.  Estimated GFR 51. EXAM: CT ABDOMEN AND PELVIS WITH CONTRAST TECHNIQUE: Multidetector CT imaging of the abdomen and pelvis was performed using the standard protocol following bolus administration of intravenous contrast. CONTRAST:  79m ISOVUE-370 IOPAMIDOL INJECTION 76% IV. Oral contrast was not administered. COMPARISON:  10/05/2003. FINDINGS: Lower chest: Mild bronchiectasis involving the RIGHT LOWER LOBE. Visualized lung bases clear. Heart size normal. Hepatobiliary: Liver normal in size and appearance. Gallbladder normal in appearance without calcified gallstones. No biliary ductal dilation. Pancreas: Scattered pancreatic calcifications. 8 mm cystic mass arising from the proximal body of the pancreas (series 2, image 18 and coronal image 32, best seen on the coronal image). No other pancreatic masses. Spleen: Normal in size and appearance. Adrenals/Urinary Tract: Normal appearing adrenal glands. Kidneys normal in size and appearance without focal parenchymal abnormality. No evidence of urinary tract calculi or obstruction. Normal-appearing decompressed urinary bladder. Stomach/Bowel: Moderate to large hiatal hernia, not significantly changed since the 2005 examination. Stomach otherwise normal in appearance. Normal-appearing small bowel. Opaque ingested material in the terminal ileum and cecum. Extensive sigmoid colon diverticulosis and scattered diverticula involving the distal descending colon. Acute diverticulitis involving the proximal sigmoid colon. Approximate 4 cm  gas containing fluid collection adjacent to the proximal sigmoid colon. No free intraperitoneal air. Normal appearing decompressed appendix in the RIGHT UPPER pelvis. Vascular/Lymphatic: Moderate aortoiliac atherosclerosis without evidence of aneurysm. Normal-appearing portal venous and systemic venous systems. No pathologic lymphadenopathy. Reproductive: Surgically absent uterus.  No adnexal masses. Other: None. Musculoskeletal: Severe degenerative disc disease and spondylosis at L1-2, L2-3, L3-4 and L4-5. Diffuse facet degenerative changes throughout the lumbar spine. Thoracolumbar levoscoliosis. No acute findings. IMPRESSION: 1. Acute/subacute sigmoid diverticulitis with an associated approximate 4 cm abscess adjacent to the proximal sigmoid colon. 2.  8 mm cystic mass involving the proximal body of the pancreas. Recommend follow up pre and post contrast MRI/MRCP or pancreatic protocol CT in 2 years. This recommendation follows ACR consensus guidelines: Management of Incidental Pancreatic Cysts: A White Paper of the ACR Incidental Findings Committee. J Am Coll Radiol 6967;89:381-017. 3. Moderate to large hiatal hernia. 4. RIGHT LOWER LOBE bronchiectasis. 5.  Aortic Atherosclerosis (ICD10-170.0) These results will be called to the ordering clinician or representative by the Radiologist Assistant, and communication documented in the PACS or zVision Dashboard. Electronically Signed   By: Evangeline Dakin M.D.   On: 12/19/2017 12:28    Review of Systems  Constitutional: Positive for fever. Negative for chills.  HENT: Negative.   Eyes: Negative.   Respiratory: Negative.   Cardiovascular: Negative.   Gastrointestinal: Positive for abdominal pain. Negative for constipation, nausea and vomiting.  Genitourinary: Negative.   Musculoskeletal: Negative.   Endo/Heme/Allergies: Negative.   All other systems reviewed and are negative.  Blood pressure (!) 157/73, pulse 93, temperature 98.6 F (37 C), temperature  source Oral, resp. rate 16, height _0  (1.473 m), weight 42.2 kg (93 lb), SpO2 99 %. Physical Exam  Nursing note and vitals reviewed. Constitutional: She is oriented to person, place, and time. She appears well-developed and well-nourished.  HENT:  Head: Normocephalic and atraumatic.  Eyes: Pupils are equal, round, and reactive to light. Conjunctivae and EOM are normal.  Neck: Normal range of motion. Neck supple.  Cardiovascular: Normal rate, regular rhythm and normal heart sounds.  Respiratory: Effort normal and breath sounds normal.  GI: Soft. Normal appearance and bowel sounds are normal. She exhibits no distension. There is tenderness (very mild tenderness in the LLQ) in the left lower quadrant. There is no rigidity, no rebound and no guarding.  Musculoskeletal: Normal range of motion.  Neurological: She is alert and oriented to person, place, and time. She has normal reflexes.  Skin: Skin is warm and dry.  Psychiatric: She has a normal mood and affect. Her behavior is normal. Judgment and thought content normal.    Assessment/Plan: Acute diverticulitis with peridiverticular abscess. Will need IR to see if the abscess could be drained and if it should be drained. For now she can have clear liquids and be placed on intravenous antibiotics.  [name] 12/19/2017, 10:03 PM

## 2017-12-19 NOTE — H&P (Signed)
History and Physical    [name] ZJQ:[id number] DOB: [birth year] DOA: 12/19/2017  Referring MD/NP/PA:   PCP: Crist Infante, MD   Patient coming from:  The patient is coming from home.  At baseline, pt is independent for most of ADL.  Chief Complaint: Abdominal pain  HPI: [name] is a 84 y.o. female with medical history significant of hypertension, hyperlipidemia, prediabetes, GERD, anxiety, diverticulosis, CKD S2, breast cancer (left mastectomy and radiation therapy), who presents with abdominal pain.  Patient states that she has been having abdominal pain for almost 3 weeks.  The abdominal pain is mainly located in the left side of abdomen, mild, constant, nonradiating, aggravated by eating food sometimes.  Patient has mild fever sometimes, but no chills.  Patient does not have nausea, vomiting or diarrhea.  Patient was seen by PCP on Monday, and was given antibiotic shot (patient does not know the name of antibiotics) and started her with Cipro and Flagyl.  Patient states that she has been taking antibiotics as prescribed, but does not feel significant improvement. Pt had CT scan done outpatient today.  She was called and told to come to emergency department because she had acute diverticulitis with an abscess. Patient does not have chest pain, shortness breath, cough.  No symptoms of UTI or unilateral weakness.   ED Course: pt was found to have WBC 15.9, lipase is 39, pending urinalysis, worsening renal function, temperature 98.6, tachycardia, tachypnea, oxygen saturation 98% on room air.  Patient is admitted to [facility] bed as inpatient.  General surgeon, Dr. Hulen Skains was consulted.  CT abdomen/pelvis as outpatient showed: 1. Acute/subacute sigmoid diverticulitis with an associated approximate 4 cm abscess adjacent to the proximal sigmoid colon. 2. 8 mm cystic mass involving the proximal body of the pancreas. Recommend follow up pre and post contrast MRI/MRCP or pancreatic protocol CT in 2  years. 3. Moderate to large hiatal hernia. 4. RIGHT LOWER LOBE bronchiectasis. 5.  Aortic Atherosclerosis (ICD10-170.0)  Review of Systems:   General: has subjetive fevers, no chills, no body weight gain, has poor appetite, has fatigue HEENT: no blurry vision, hearing changes or sore throat Respiratory: no dyspnea, coughing, wheezing CV: no chest pain, no palpitations GI: no nausea, vomiting, has abdominal pain, no diarrhea, constipation GU: no dysuria, burning on urination, increased urinary frequency, hematuria  Ext: no leg edema Neuro: no unilateral weakness, numbness, or tingling, no vision change or hearing loss Skin: no rash, no skin tear. MSK: No muscle spasm, no deformity, no limitation of range of movement in spin Heme: No easy bruising.  Travel history: No recent long distant travel.  Allergy: No Known Allergies  Past Medical History:  Diagnosis Date  . Breast cancer ([place])   . Diverticulosis   . Hiatal hernia   . Hypercholesteremia   . Hypertension   . Osteopenia   . Osteoporosis     Past Surgical History:  Procedure Laterality Date  . ABDOMINAL HYSTERECTOMY    . MASTECTOMY  1999   LEFT    Social History:  reports that she has never smoked. She has never used smokeless tobacco. Her alcohol and drug histories are not on file.  Family History:  Family History  Problem Relation Age of Onset  . Arthritis Father   . Stroke Father   . Hypertension Mother      Prior to Admission medications   Medication Sig Start Date End Date Taking? Authorizing Provider  ALPRAZolam Duanne Moron) 0.25 MG tablet Take 1/2 tablet by mouth  at bedtime as needed for anxiety (sleep)    [provider]  Ascorbic Acid (VITAMIN C) 500 MG tablet Take 500 mg by mouth daily.      [provider]  aspirin EC 81 MG tablet Take 1 tablet (81 mg total) by mouth daily. 10/18/16   Richardson Dopp T, PA-C  atorvastatin (LIPITOR) 20 MG tablet Take 20 mg by mouth daily. 03/02/16   [provider]  Cholecalciferol (VITAMIN D) 1000 UNITS capsule Take 1,000 Units by mouth daily.      [provider]  Coenzyme Q10 (CO Q-10) 200 MG CAPS Take 1 capsule by mouth daily.     [provider]  ezetimibe (ZETIA) 10 MG tablet Take 10 mg by mouth daily. 02/26/16   [provider]  Ibuprofen-Diphenhydramine Cit (ADVIL PM PO) Take 1 tablet by mouth at bedtime as needed (for arthritis).     [provider]  losartan (COZAAR) 25 MG tablet Take 1 tablet (25 mg total) by mouth daily. 04/30/17   Nahser, Wonda Cheng, MD  metoprolol succinate (TOPROL-XL) 25 MG 24 hr tablet TAKE 1/2 TABLET BY MOUTH DAILY 10/01/17   Nahser, Wonda Cheng, MD  Multiple Vitamin (MULTIVITAMIN) tablet Take 1 tablet by mouth daily.      [provider]  omeprazole (PRILOSEC) 20 MG capsule Take 1 capsule (20 mg total) by mouth daily. 05/27/15   Darlin Coco, MD  Psyllium (METAMUCIL PO) Take 1 Dose by mouth daily as needed (for constipation).     [provider]  raloxifene (EVISTA) 60 MG tablet TAKE 1 TABLET BY MOUTH ONCE DAILY 12/23/14   Darlin Coco, MD  vitamin B-12 (CYANOCOBALAMIN) 500 MCG tablet Take 500 mcg by mouth daily.     [provider]    Physical Exam: Vitals:   12/19/17 1800 12/19/17 1815 12/19/17 1830 12/19/17 1915  BP: (!) 153/72 (!) 144/69 (!) 164/79 (!) 161/62  Pulse: 86 83 92 95  Resp: 19 16 (!) 21 (!) 21  Temp:      TempSrc:      SpO2: 100% 98% 99% 100%  Weight:      Height:       General: Not in acute distress HEENT:       Eyes: PERRL, EOMI, no scleral icterus.       ENT: No discharge from the ears and nose, no pharynx injection, no tonsillar enlargement.        Neck: No JVD, no bruit, no mass felt. Heme: No neck lymph node enlargement. Cardiac: S1/S2, RRR, No murmurs, No gallops or rubs. Respiratory: No rales, wheezing, rhonchi or rubs. GI: Soft, nondistended, has tenderness in left and central abdomen, no rebound pain, no  organomegaly, BS present. GU: No hematuria Ext: No pitting leg edema bilaterally. 2+DP/PT pulse bilaterally. Musculoskeletal: No joint deformities, No joint redness or warmth, no limitation of ROM in spin. Skin: No rashes.  Neuro: Alert, oriented X3, cranial nerves II-XII grossly intact, moves all extremities normally. Psych: Patient is not psychotic, no suicidal or hemocidal ideation.  Labs on Admission: I have personally reviewed following labs and imaging studies  CBC: Recent Labs  Lab 12/19/17 1558  WBC 15.9*  HGB 12.1  HCT 39.0  MCV 90.5  PLT 263*   Basic Metabolic Panel: Recent Labs  Lab 12/19/17 1558  NA 132*  K 4.8  CL 98  CO2 22  GLUCOSE 390*  BUN 12  CREATININE 1.22*  CALCIUM 9.0   GFR: Estimated Creatinine Clearance:  21.4 mL/min (A) (by C-G formula based on SCr of 1.22 mg/dL (H)). Liver Function Tests: Recent Labs  Lab 12/19/17 1558  AST 25  ALT 21  ALKPHOS 61  BILITOT 0.4  PROT 6.6  ALBUMIN 3.2*   Recent Labs  Lab 12/19/17 1558  LIPASE 39   No results for input(s): AMMONIA in the last 168 hours. Coagulation Profile: No results for input(s): INR, PROTIME in the last 168 hours. Cardiac Enzymes: No results for input(s): CKTOTAL, CKMB, CKMBINDEX, TROPONINI in the last 168 hours. BNP (last 3 results) No results for input(s): PROBNP in the last 8760 hours. HbA1C: No results for input(s): HGBA1C in the last 72 hours. CBG: No results for input(s): GLUCAP in the last 168 hours. Lipid Profile: No results for input(s): CHOL, HDL, LDLCALC, TRIG, CHOLHDL, LDLDIRECT in the last 72 hours. Thyroid Function Tests: No results for input(s): TSH, T4TOTAL, FREET4, T3FREE, THYROIDAB in the last 72 hours. Anemia Panel: No results for input(s): VITAMINB12, FOLATE, FERRITIN, TIBC, IRON, RETICCTPCT in the last 72 hours. Urine analysis:    Component Value Date/Time   COLORURINE STRAW (A) 12/19/2017 1854   APPEARANCEUR CLEAR 12/19/2017 1854   LABSPEC 1.013  12/19/2017 1854   PHURINE 8.0 12/19/2017 1854   GLUCOSEU 50 (A) 12/19/2017 1854   HGBUR NEGATIVE 12/19/2017 [place] 12/19/2017 [place] 12/19/2017 1854   PROTEINUR NEGATIVE 12/19/2017 1854   NITRITE NEGATIVE 12/19/2017 1854   LEUKOCYTESUR SMALL (A) 12/19/2017 1854   Sepsis Labs: @LABRCNTIP (procalcitonin:4,lacticidven:4) )No results found for this or any previous visit (from the past 240 hour(s)).   Radiological Exams on Admission: Ct Abdomen Pelvis W Contrast  Result Date: 12/19/2017 CLINICAL DATA:  One month history of LEFT LOWER QUADRANT abdominal pain. Patient is being treated for diverticulitis, and presents with leukocytosis. Personal history of LEFT breast cancer post mastectomy in 1999. Surgical history also includes abdominal hysterectomy. Creatinine was obtained on site at [facility] at [address] Results: Creatinine 1.0 mg/dL.  BUN 18.  Estimated GFR 51. EXAM: CT ABDOMEN AND PELVIS WITH CONTRAST TECHNIQUE: Multidetector CT imaging of the abdomen and pelvis was performed using the standard protocol following bolus administration of intravenous contrast. CONTRAST:  17mL ISOVUE-370 IOPAMIDOL INJECTION 76% IV. Oral contrast was not administered. COMPARISON:  10/05/2003. FINDINGS: Lower chest: Mild bronchiectasis involving the RIGHT LOWER LOBE. Visualized lung bases clear. Heart size normal. Hepatobiliary: Liver normal in size and appearance. Gallbladder normal in appearance without calcified gallstones. No biliary ductal dilation. Pancreas: Scattered pancreatic calcifications. 8 mm cystic mass arising from the proximal body of the pancreas (series 2, image 18 and coronal image 32, best seen on the coronal image). No other pancreatic masses. Spleen: Normal in size and appearance. Adrenals/Urinary Tract: Normal appearing adrenal glands. Kidneys normal in size and appearance without focal parenchymal abnormality. No evidence of urinary tract  calculi or obstruction. Normal-appearing decompressed urinary bladder. Stomach/Bowel: Moderate to large hiatal hernia, not significantly changed since the 2005 examination. Stomach otherwise normal in appearance. Normal-appearing small bowel. Opaque ingested material in the terminal ileum and cecum. Extensive sigmoid colon diverticulosis and scattered diverticula involving the distal descending colon. Acute diverticulitis involving the proximal sigmoid colon. Approximate 4 cm gas containing fluid collection adjacent to the proximal sigmoid colon. No free intraperitoneal air. Normal appearing decompressed appendix in the RIGHT UPPER pelvis. Vascular/Lymphatic: Moderate aortoiliac atherosclerosis without evidence of aneurysm. Normal-appearing portal venous and systemic venous systems. No pathologic lymphadenopathy. Reproductive: Surgically absent uterus.  No adnexal  masses. Other: None. Musculoskeletal: Severe degenerative disc disease and spondylosis at L1-2, L2-3, L3-4 and L4-5. Diffuse facet degenerative changes throughout the lumbar spine. Thoracolumbar levoscoliosis. No acute findings. IMPRESSION: 1. Acute/subacute sigmoid diverticulitis with an associated approximate 4 cm abscess adjacent to the proximal sigmoid colon. 2. 8 mm cystic mass involving the proximal body of the pancreas. Recommend follow up pre and post contrast MRI/MRCP or pancreatic protocol CT in 2 years. This recommendation follows ACR consensus guidelines: Management of Incidental Pancreatic Cysts: A White Paper of the ACR Incidental Findings Committee. J Am Coll Radiol 5009;38:182-993. 3. Moderate to large hiatal hernia. 4. RIGHT LOWER LOBE bronchiectasis. 5.  Aortic Atherosclerosis (ICD10-170.0) These results will be called to the ordering clinician or representative by the Radiologist Assistant, and communication documented in the PACS or zVision Dashboard. Electronically Signed   By: Evangeline Dakin M.D.   On: 12/19/2017 12:28     EKG:    Not done in ED, will get one.   Assessment/Plan Principal Problem:   Diverticulitis of large intestine with abscess Active Problems:   Hypercholesterolemia   Sepsis ([place])   HTN (hypertension)   History of prediabetes   GERD (gastroesophageal reflux disease)   Anxiety   Acute renal failure superimposed on stage 2 chronic kidney disease (HCC)   Type II diabetes mellitus with renal manifestations ([place])   Pancreatic cyst    Sepsis due to diverticulitis of large intestine with abscess: Patient meets criteria for sepsis with leukocytosis, tachycardia and tachypnea, but no fever.  Currently hemodynamically stable.  General surgeon, Dr. Hulen Skains was consulted.  -will admit to MedSurg bed as inpatient -IV Zosyn -PRN morphine for pain, Zofran for nausea -will get Procalcitonin and trend lactic acid levels per sepsis protocol. -IVF: 1L of NS bolus in ED, followed by 100 cc/h  -f/u general surgeon's recommendation  HLD: -zetia and lipitor  HTN (hypertension): -hold cozarr due to Worsening renal function -Continue metoprolol -IV hydralazine prn -Start amlodipine 5 mg daily   GERD: -Protonix  Anxiety: -prn Xanax  Acute renal failure superimposed on stage 2 chronic kidney disease ([place]): Baseline creatinine 0.9.  Her creatinine is 1.22, BUN 12, likely due to dehydration and continuation of Cozaar, Advil (ibuprofen-diphenhydramine). -Hold Cozaar and Advil -IV fluid as above -Follow-up renal function by BMP  History of prediabetes and Type II diabetes mellitus with renal manifestations ([place]): pt states that she had A1c 6.6 in May, which meets criteria for diagnosis of DM. She has CKD-II. Blood sugar is 390 today -start lantus 3 U daily -SSI -check A1c  Pancreatic cyst: CT scan has incidental findings, 8 mm cystic mass involving the proximal body of the pancreas. Per radiologist, "recommend follow up pre and post contrast MRI/MRCP or pancreatic protocol CT in 2 years. This  recommendation follows ACR consensus guidelines: Management of Incidental Pancreatic Cysts: A White Paper of the ACR Incidental Findings Committee. J Am Coll Radiol" -f/u with PCP  DVT ppx: SCD Code Status: Full code Family Communication:   Yes, patient's friend who is POA  at bed side Disposition Plan:  Anticipate discharge back to previous home environment Consults called: Education officer, environmental, Dr. Hulen Skains Admission status:   medical floor/inpt   Date of Service 12/19/2017    Ivor Costa [facility] Pager [phone]  If 7PM-7AM, please contact night-coverage [email or web address] Password [facility] 12/19/2017, 8:53 PM

## 2017-12-20 DIAGNOSIS — K5730 Diverticulosis of large intestine without perforation or abscess without bleeding: Secondary | ICD-10-CM

## 2017-12-20 LAB — HEMOGLOBIN A1C
Hgb A1c MFr Bld: 7.8 % — ABNORMAL HIGH (ref 4.8–5.6)
Mean Plasma Glucose: 177.16 mg/dL

## 2017-12-20 LAB — GLUCOSE, CAPILLARY
GLUCOSE-CAPILLARY: 121 mg/dL — AB (ref 70–99)
GLUCOSE-CAPILLARY: 100 mg/dL — AB (ref 70–99)
Glucose-Capillary: 99 mg/dL (ref 70–99)
Glucose-Capillary: 136 mg/dL — ABNORMAL HIGH (ref 70–99)
Glucose-Capillary: 95 mg/dL (ref 70–99)

## 2017-12-20 LAB — LACTIC ACID, PLASMA: Lactic Acid, Venous: 0.8 mmol/L (ref 0.5–1.9)

## 2017-12-20 NOTE — Progress Notes (Signed)
PROGRESS NOTE  [name] LPF:[id number] DOB: 03/07/1929 DOA: 12/19/2017 PCP: Crist Infante, MD   LOS: 1 day   Brief Narrative / Interim history: 84 year old female, quite independent, living alone, driving and able to do all her ADLs, with history of hypertension, hyperlipidemia, diabetes, history of breast cancer who presents to the hospital with abdominal pain.  Patient has been having abdominal pain for the past 2 to 3 weeks, mainly in the left lower quadrant.  She was evaluated in outpatient and eventually started on ciprofloxacin and metronidazole, in the ED she underwent a CT scan and she took that for a few days.  On follow-up visit she had blood work which showed a leukocytosis of 15, she is still having pain and was directed to the emergency room for further evaluation.  Of the abdomen and pelvis which showed the presence of diverticulitis as well as a diverticular abscess.  General surgery was consulted and patient was admitted to the hospital.  Assessment & Plan: Principal Problem:   Diverticulitis of large intestine with abscess Active Problems:   Hypercholesterolemia   Sepsis ([place])   HTN (hypertension)   History of prediabetes   GERD (gastroesophageal reflux disease)   Anxiety   Acute renal failure superimposed on stage 2 chronic kidney disease (HCC)   Type II diabetes mellitus with renal manifestations ([place])   Pancreatic cyst   Diverticulitis of large intestine with abscess -I do not think she was septic on admission, her tachycardia and tachypnea were not sustained.  Only thing she had was leukocytosis.  Currently she is hemodynamically stable, she was started on IV Zosyn which will be continued -General surgery following -IR consulted this morning to evaluate whether drainage is possible, and it seems like her abscess is quite deep and may not be safe to reach.  For now recommendations are for IV antibiotics and repeat imaging in a few days  Hyperlipidemia -Continue  home medications  Hypertension -Hold Cozaar due to slight bump in creatinine, continue metoprolol  Anxiety -PRN Xanax  Chronic kidney disease stage II -Overall at baseline  History of prediabetes -CBGs and admission was 390, she was started on Lantus 3 units and sliding scale, CBGs well-controlled, I will hold Lantus for now and maintain on sliding scale alone as she is on clear liquids  Pancreatic cyst -Incidental finding, will need outpatient follow-up per radiology recommendations  DVT prophylaxis: SCDs Code Status: Full code Family Communication: No family at bedside, patient does not seem to have any family, she has a friend who is the POA Disposition Plan: Home when ready   Consultants:   General surgery  IR  Procedures:   None   Antimicrobials:  Zosyn 7/24 >>   Subjective: - no chest pain, shortness of breath, still has slight abdominal pain, denies any nausea or vomiting.  Objective: Vitals:   12/19/17 2115 12/19/17 2144 12/19/17 2205 12/20/17 0410  BP: 134/69 (!) 157/73  132/63  Pulse: 88 93  92  Resp:  16  16  Temp:   98.1 F (36.7 C) 98.2 F (36.8 C)  TempSrc:   Oral Oral  SpO2: 98% 99%  100%  Weight:  42.2 kg (93 lb 0.6 oz)    Height:  4\' 10"  (1.473 m)      Intake/Output Summary (Last 24 hours) at 12/20/2017 1321 Last data filed at 12/20/2017 0622 Gross per 24 hour  Intake 1598.93 ml  Output -  Net 1598.93 ml   Filed Weights   12/19/17 1744  12/19/17 2144  Weight: 42.2 kg (93 lb) 42.2 kg (93 lb 0.6 oz)    Examination:  Constitutional: NAD Eyes: PERRL, lids and conjunctivae normal ENMT: Mucous membranes are moist.  Neck: normal, supple Respiratory: clear to auscultation bilaterally, no wheezing, no crackles. Normal respiratory effort. No accessory muscle use.  Cardiovascular: Regular rate and rhythm, no murmurs / rubs / gallops. No LE edema.  Abdomen: Soft, mild tenderness to palpation in the left lower quadrant, no guarding or  rebound, bowel sounds positive Skin: no rashes seen Neurologic: CN 2-12 grossly intact. Strength 5/5 in all 4.  Psychiatric: Normal judgment and insight. Alert and oriented x 3. Normal mood.    Data Reviewed: I have independently reviewed following labs and imaging studies   CBC: Recent Labs  Lab 12/19/17 1558  WBC 15.9*  HGB 12.1  HCT 39.0  MCV 90.5  PLT 024*   Basic Metabolic Panel: Recent Labs  Lab 12/19/17 1558 12/19/17 2053  NA 132* 139  K 4.8 4.0  CL 98 107  CO2 22 23  GLUCOSE 390* 118*  BUN 12 10  CREATININE 1.22* 1.05*  CALCIUM 9.0 8.6*   GFR: Estimated Creatinine Clearance: 24.8 mL/min (A) (by C-G formula based on SCr of 1.05 mg/dL (H)). Liver Function Tests: Recent Labs  Lab 12/19/17 1558 12/19/17 2053  AST 25 19  ALT 21 18  ALKPHOS 61 55  BILITOT 0.4 0.3  PROT 6.6 5.8*  ALBUMIN 3.2* 3.0*   Recent Labs  Lab 12/19/17 1558  LIPASE 39   No results for input(s): AMMONIA in the last 168 hours. Coagulation Profile: Recent Labs  Lab 12/19/17 2053  INR 1.19   Cardiac Enzymes: No results for input(s): CKTOTAL, CKMB, CKMBINDEX, TROPONINI in the last 168 hours. BNP (last 3 results) No results for input(s): PROBNP in the last 8760 hours. HbA1C: Recent Labs    12/20/17 0005  HGBA1C 7.8*   CBG: Recent Labs  Lab 12/19/17 2206 12/20/17 0602 12/20/17 0804 12/20/17 1146  GLUCAP 96 99 121* 136*   Lipid Profile: No results for input(s): CHOL, HDL, LDLCALC, TRIG, CHOLHDL, LDLDIRECT in the last 72 hours. Thyroid Function Tests: No results for input(s): TSH, T4TOTAL, FREET4, T3FREE, THYROIDAB in the last 72 hours. Anemia Panel: No results for input(s): VITAMINB12, FOLATE, FERRITIN, TIBC, IRON, RETICCTPCT in the last 72 hours. Urine analysis:    Component Value Date/Time   COLORURINE STRAW (A) 12/19/2017 1854   APPEARANCEUR CLEAR 12/19/2017 1854   LABSPEC 1.013 12/19/2017 1854   PHURINE 8.0 12/19/2017 1854   GLUCOSEU 50 (A) 12/19/2017 1854     HGBUR NEGATIVE 12/19/2017 [place] NEGATIVE 12/19/2017 [place] 12/19/2017 1854   PROTEINUR NEGATIVE 12/19/2017 1854   NITRITE NEGATIVE 12/19/2017 1854   LEUKOCYTESUR SMALL (A) 12/19/2017 1854   Sepsis Labs: Invalid input(s): PROCALCITONIN, LACTICIDVEN  No results found for this or any previous visit (from the past 240 hour(s)).    Radiology Studies: Ct Abdomen Pelvis W Contrast  Result Date: 12/19/2017 CLINICAL DATA:  One month history of LEFT LOWER QUADRANT abdominal pain. Patient is being treated for diverticulitis, and presents with leukocytosis. Personal history of LEFT breast cancer post mastectomy in 1999. Surgical history also includes abdominal hysterectomy. Creatinine was obtained on site at [facility] at [address] Results: Creatinine 1.0 mg/dL.  BUN 18.  Estimated GFR 51. EXAM: CT ABDOMEN AND PELVIS WITH CONTRAST TECHNIQUE: Multidetector CT imaging of the abdomen and pelvis was performed using the standard  protocol following bolus administration of intravenous contrast. CONTRAST:  77mL ISOVUE-370 IOPAMIDOL INJECTION 76% IV. Oral contrast was not administered. COMPARISON:  10/05/2003. FINDINGS: Lower chest: Mild bronchiectasis involving the RIGHT LOWER LOBE. Visualized lung bases clear. Heart size normal. Hepatobiliary: Liver normal in size and appearance. Gallbladder normal in appearance without calcified gallstones. No biliary ductal dilation. Pancreas: Scattered pancreatic calcifications. 8 mm cystic mass arising from the proximal body of the pancreas (series 2, image 18 and coronal image 32, best seen on the coronal image). No other pancreatic masses. Spleen: Normal in size and appearance. Adrenals/Urinary Tract: Normal appearing adrenal glands. Kidneys normal in size and appearance without focal parenchymal abnormality. No evidence of urinary tract calculi or obstruction. Normal-appearing decompressed urinary bladder. Stomach/Bowel:  Moderate to large hiatal hernia, not significantly changed since the 2005 examination. Stomach otherwise normal in appearance. Normal-appearing small bowel. Opaque ingested material in the terminal ileum and cecum. Extensive sigmoid colon diverticulosis and scattered diverticula involving the distal descending colon. Acute diverticulitis involving the proximal sigmoid colon. Approximate 4 cm gas containing fluid collection adjacent to the proximal sigmoid colon. No free intraperitoneal air. Normal appearing decompressed appendix in the RIGHT UPPER pelvis. Vascular/Lymphatic: Moderate aortoiliac atherosclerosis without evidence of aneurysm. Normal-appearing portal venous and systemic venous systems. No pathologic lymphadenopathy. Reproductive: Surgically absent uterus.  No adnexal masses. Other: None. Musculoskeletal: Severe degenerative disc disease and spondylosis at L1-2, L2-3, L3-4 and L4-5. Diffuse facet degenerative changes throughout the lumbar spine. Thoracolumbar levoscoliosis. No acute findings. IMPRESSION: 1. Acute/subacute sigmoid diverticulitis with an associated approximate 4 cm abscess adjacent to the proximal sigmoid colon. 2. 8 mm cystic mass involving the proximal body of the pancreas. Recommend follow up pre and post contrast MRI/MRCP or pancreatic protocol CT in 2 years. This recommendation follows ACR consensus guidelines: Management of Incidental Pancreatic Cysts: A White Paper of the ACR Incidental Findings Committee. J Am Coll Radiol 2505;39:767-341. 3. Moderate to large hiatal hernia. 4. RIGHT LOWER LOBE bronchiectasis. 5.  Aortic Atherosclerosis (ICD10-170.0) These results will be called to the ordering clinician or representative by the Radiologist Assistant, and communication documented in the PACS or zVision Dashboard. Electronically Signed   By: Evangeline Dakin M.D.   On: 12/19/2017 12:28     Scheduled Meds: . amLODipine  5 mg Oral Daily  . atorvastatin  20 mg Oral Daily  .  cholecalciferol  1,000 Units Oral Daily  . ezetimibe  10 mg Oral Daily  . insulin aspart  0-9 Units Subcutaneous TID WC  . insulin glargine  3 Units Subcutaneous QHS  . metoprolol succinate  12.5 mg Oral Daily  . multivitamin with minerals  1 tablet Oral Daily  . pantoprazole  40 mg Oral Daily  . psyllium  1 packet Oral QHS  . raloxifene  60 mg Oral Daily  . vitamin B-12  500 mcg Oral Daily  . vitamin C  500 mg Oral Daily   Continuous Infusions: . sodium chloride 100 mL/hr at 12/20/17 0933  . piperacillin-tazobactam (ZOSYN)  IV 12.5 mL/hr at 12/20/17 9379     Seward Grater, MD, PhD [facility] Pager [phone]  If 7PM-7AM, please contact night-coverage [email or web address] Password [email or web address] 12/20/2017, 1:21 PM

## 2017-12-20 NOTE — Progress Notes (Signed)
Patient ID: [name], female   DOB: 1929-03-20, 84 y.o.   MRN: [id number] IR consulted about a percutaneous abscess drain.  I reviewed the CT images.  At this time, there is a not a safe percutaneous window for aspiration or drainage.  Recommend a repeat CT in a few days after medical therapy to see how the abscess responds.

## 2017-12-20 NOTE — Progress Notes (Signed)
Patient ID: [name], female   DOB: 1928/11/29, 84 y.o.   MRN: [id number]       Subjective: Pt feels ok this morning.  Still some tender, but seems better.    Objective: Vital signs in last 24 hours: Temp:  [98.1 F (36.7 C)-98.6 F (37 C)] 98.2 F (36.8 C) (07/25 0410) Pulse Rate:  [82-100] 92 (07/25 0410) Resp:  [14-21] 16 (07/25 0410) BP: (129-164)/(62-79) 132/63 (07/25 0410) SpO2:  [98 %-100 %] 100 % (07/25 0410) Weight:  [42.2 kg (93 lb)-42.2 kg (93 lb 0.6 oz)] 42.2 kg (93 lb 0.6 oz) (07/24 2144) Last BM Date: 12/20/17  Intake/Output from previous day: 07/24 0701 - 07/25 0700 In: 1598.9 [P.O.:120; I.V.:645; IV Piggyback:834] Out: -  Intake/Output this shift: No intake/output data recorded.  PE: Heart: regular Lungs: CTAB Abd: soft, mildly tender in LLQ, but no guarding, +BS, ND  Lab Results:  Recent Labs    12/19/17 1558  WBC 15.9*  HGB 12.1  HCT 39.0  PLT 427*   BMET Recent Labs    12/19/17 1558 12/19/17 2053  NA 132* 139  K 4.8 4.0  CL 98 107  CO2 22 23  GLUCOSE 390* 118*  BUN 12 10  CREATININE 1.22* 1.05*  CALCIUM 9.0 8.6*   PT/INR Recent Labs    12/19/17 2053  LABPROT 15.0  INR 1.19   CMP     Component Value Date/Time   NA 139 12/19/2017 2053   NA 141 10/17/2016 0945   K 4.0 12/19/2017 2053   CL 107 12/19/2017 2053   CO2 23 12/19/2017 2053   GLUCOSE 118 (H) 12/19/2017 2053   BUN 10 12/19/2017 2053   BUN 14 10/17/2016 0945   CREATININE 1.05 (H) 12/19/2017 2053   CREATININE 0.88 04/10/2016 0953   CALCIUM 8.6 (L) 12/19/2017 2053   PROT 5.8 (L) 12/19/2017 2053   PROT 6.4 10/17/2016 0945   ALBUMIN 3.0 (L) 12/19/2017 2053   ALBUMIN 4.1 10/17/2016 0945   AST 19 12/19/2017 2053   ALT 18 12/19/2017 2053   ALKPHOS 55 12/19/2017 2053   BILITOT 0.3 12/19/2017 2053   BILITOT 0.3 10/17/2016 0945   GFRNONAA 47 (L) 12/19/2017 2053   GFRAA 54 (L) 12/19/2017 2053   Lipase     Component Value Date/Time   LIPASE 39 12/19/2017 1558        Studies/Results: Ct Abdomen Pelvis W Contrast  Result Date: 12/19/2017 CLINICAL DATA:  One month history of LEFT LOWER QUADRANT abdominal pain. Patient is being treated for diverticulitis, and presents with leukocytosis. Personal history of LEFT breast cancer post mastectomy in 1999. Surgical history also includes abdominal hysterectomy. Creatinine was obtained on site at [facility] at [address] Results: Creatinine 1.0 mg/dL.  BUN 18.  Estimated GFR 51. EXAM: CT ABDOMEN AND PELVIS WITH CONTRAST TECHNIQUE: Multidetector CT imaging of the abdomen and pelvis was performed using the standard protocol following bolus administration of intravenous contrast. CONTRAST:  30mL ISOVUE-370 IOPAMIDOL INJECTION 76% IV. Oral contrast was not administered. COMPARISON:  10/05/2003. FINDINGS: Lower chest: Mild bronchiectasis involving the RIGHT LOWER LOBE. Visualized lung bases clear. Heart size normal. Hepatobiliary: Liver normal in size and appearance. Gallbladder normal in appearance without calcified gallstones. No biliary ductal dilation. Pancreas: Scattered pancreatic calcifications. 8 mm cystic mass arising from the proximal body of the pancreas (series 2, image 18 and coronal image 32, best seen on the coronal image). No other pancreatic masses. Spleen: Normal in size and appearance. Adrenals/Urinary  Tract: Normal appearing adrenal glands. Kidneys normal in size and appearance without focal parenchymal abnormality. No evidence of urinary tract calculi or obstruction. Normal-appearing decompressed urinary bladder. Stomach/Bowel: Moderate to large hiatal hernia, not significantly changed since the 2005 examination. Stomach otherwise normal in appearance. Normal-appearing small bowel. Opaque ingested material in the terminal ileum and cecum. Extensive sigmoid colon diverticulosis and scattered diverticula involving the distal descending colon. Acute diverticulitis involving the proximal sigmoid  colon. Approximate 4 cm gas containing fluid collection adjacent to the proximal sigmoid colon. No free intraperitoneal air. Normal appearing decompressed appendix in the RIGHT UPPER pelvis. Vascular/Lymphatic: Moderate aortoiliac atherosclerosis without evidence of aneurysm. Normal-appearing portal venous and systemic venous systems. No pathologic lymphadenopathy. Reproductive: Surgically absent uterus.  No adnexal masses. Other: None. Musculoskeletal: Severe degenerative disc disease and spondylosis at L1-2, L2-3, L3-4 and L4-5. Diffuse facet degenerative changes throughout the lumbar spine. Thoracolumbar levoscoliosis. No acute findings. IMPRESSION: 1. Acute/subacute sigmoid diverticulitis with an associated approximate 4 cm abscess adjacent to the proximal sigmoid colon. 2. 8 mm cystic mass involving the proximal body of the pancreas. Recommend follow up pre and post contrast MRI/MRCP or pancreatic protocol CT in 2 years. This recommendation follows ACR consensus guidelines: Management of Incidental Pancreatic Cysts: A White Paper of the ACR Incidental Findings Committee. J Am Coll Radiol 2130;86:578-469. 3. Moderate to large hiatal hernia. 4. RIGHT LOWER LOBE bronchiectasis. 5.  Aortic Atherosclerosis (ICD10-170.0) These results will be called to the ordering clinician or representative by the Radiologist Assistant, and communication documented in the PACS or zVision Dashboard. Electronically Signed   By: Evangeline Dakin M.D.   On: 12/19/2017 12:28    Anti-infectives: Anti-infectives (From admission, onward)   Start     Dose/Rate Route Frequency Ordered Stop   12/20/17 0600  piperacillin-tazobactam (ZOSYN) IVPB 3.375 g     3.375 g 12.5 mL/hr over 240 Minutes Intravenous Every 12 hours 12/19/17 2032     12/19/17 1800  piperacillin-tazobactam (ZOSYN) IVPB 3.375 g     3.375 g 100 mL/hr over 30 Minutes Intravenous  Once 12/19/17 1748 12/19/17 1827       Assessment/Plan  Diverticulitis with abscess   -IR has evaluated her CT scan and does not feel there is a safe window currently to place a drain -cont with conservative management with IVFs, clear liquids, and IV abx therapy -if she fails, she may need an operation or repeat scan to see if the collection is amenable at that time; however, patient seems quite comfortable at this time.  Hopefully, she will improve with medical management.  FEN - clears/IVFs VTE - SCDs/Safe for DVT prophylaxis from our standpoint ID - [email or web address]   LOS: 1 day    Henreitta Cea , [facility] Surgery 12/20/2017, 9:46 AM Pager: [phone]

## 2017-12-20 NOTE — Plan of Care (Signed)
  Problem: Clinical Measurements: Goal: Ability to maintain clinical measurements within normal limits will improve Outcome: Progressing   Problem: Coping: Goal: Level of anxiety will decrease Outcome: Progressing   Problem: Pain Managment: Goal: General experience of comfort will improve Outcome: Progressing   

## 2017-12-21 DIAGNOSIS — K862 Cyst of pancreas: Secondary | ICD-10-CM

## 2017-12-21 LAB — BASIC METABOLIC PANEL
Anion gap: 8 (ref 5–15)
BUN: <5 mg/dL — ABNORMAL LOW (ref 8–23)
CHLORIDE: 109 mmol/L (ref 98–111)
CO2: 23 mmol/L (ref 22–32)
Calcium: 7.8 mg/dL — ABNORMAL LOW (ref 8.9–10.3)
Creatinine, Ser: 1.03 mg/dL — ABNORMAL HIGH (ref 0.44–1.00)
GFR calc Af Amer: 55 mL/min — ABNORMAL LOW (ref >60)
GFR calc non Af Amer: 48 mL/min — ABNORMAL LOW (ref >60)
GLUCOSE: 86 mg/dL (ref 70–99)
POTASSIUM: 3.7 mmol/L (ref 3.5–5.1)
Sodium: 140 mmol/L (ref 135–145)

## 2017-12-21 LAB — CBC
HEMATOCRIT: 32.9 % — AB (ref 36.0–46.0)
Hemoglobin: 10.3 g/dL — ABNORMAL LOW (ref 12.0–15.0)
MCH: 28.9 pg (ref 26.0–34.0)
MCHC: 31.3 g/dL (ref 30.0–36.0)
MCV: 92.2 fL (ref 78.0–100.0)
Platelets: 315 10*3/uL (ref 150–400)
RBC: 3.57 MIL/uL — AB (ref 3.87–5.11)
RDW: 12.8 % (ref 11.5–15.5)
WBC: 17.3 10*3/uL — ABNORMAL HIGH (ref 4.0–10.5)

## 2017-12-21 LAB — GLUCOSE, CAPILLARY
GLUCOSE-CAPILLARY: 158 mg/dL — AB (ref 70–99)
GLUCOSE-CAPILLARY: 195 mg/dL — AB (ref 70–99)
Glucose-Capillary: 77 mg/dL (ref 70–99)
Glucose-Capillary: 150 mg/dL — ABNORMAL HIGH (ref 70–99)

## 2017-12-21 NOTE — Care Management Important Message (Signed)
Important Message  Patient Details  Name: HOLLEY KOCUREK MRN: [id number] Date of Birth: Dec 24, 1928   Medicare Important Message Given:  Yes    Stanislaus Kaltenbach 12/21/2017, 3:38 PM

## 2017-12-21 NOTE — Progress Notes (Addendum)
PROGRESS NOTE    [name]  GGE:[id number] DOB: [birth year] DOA: 12/19/2017 PCP: Crist Infante, MD   Brief Narrative:  84 year old female, quite independent, living alone, driving and able to do all her ADLs, with history of hypertension, hyperlipidemia, diabetes, history of breast cancer who presents to the hospital with abdominal pain.  Patient has been having abdominal pain for the past 2 to 3 weeks, mainly in the left lower quadrant.  She was evaluated in outpatient and eventually started on ciprofloxacin and metronidazole, in the ED she underwent a CT scan and she took that for a few days.  On follow-up visit she had blood work which showed a leukocytosis of 15, she is still having pain and was directed to the emergency room for further evaluation.  Of the abdomen and pelvis which showed the presence of diverticulitis as well as a diverticular abscess.  General surgery was consulted and patient was admitted to the hospital. Assessment & Plan   Diverticulitis of the large intestine with abscess -Patient did not appear to be septic on admission, her tachycardia and tachypnea were not sustained.  She did however  have leukocytosis.  Currently she is hemodynamically stable. -Continue IV Zosyn -General surgery consulted and appreciated, will plan to increase patient's diet to full liquids today and monitor -Interventional radiology consulted and appreciated however felt it was unsafe to drain the abscess at this time.  Recommended IV antibiotics with repeat imaging -Patient continues to have leukocytosis, will continue to monitor CBC  Hyperlipidemia -Continue statin, Zetia  Hypertension -Cozaar held due to slight bump of creatinine -BP currently stable, continue metoprolol, amlodipine  Anxiety -Continue Xanax as needed  Acute kidney injury on Chronic kidney disease, stage II -Creatinine 2018 was 0.9.  On admission 1.22 -Improving, currently 1.03, however GFR within stage III -Continue  to monitor BMP  Diabetes Mellitus, type II -Hemoglobin A1c 7.8 -CBGs on admission are 390, she was started on 3 units of Lantus along with insulin sliding scale -CBGs are currently stable, Lantus has been held  Pancreatic cyst -Incidental finding, patient will need outpatient follow-up per  radiology recommendations  Normocytic anemia -Hemoglobin currently 10.3, slight drop from admission of 12.1 -Possibly delusional as patient has been receiving IV fluids -Have discontinued IV fluids today as patient has been able to tolerate clear liquid diet -Continue to monitor CBC  DVT Prophylaxis SCDs  Code Status: Full  Family Communication: None at bedside  Disposition Plan: Admitted. Pending improvement.  Consultants General surgery Interventional radiology  Procedures  None  Antibiotics   Anti-infectives (From admission, onward)   Start     Dose/Rate Route Frequency Ordered Stop   12/20/17 0600  piperacillin-tazobactam (ZOSYN) IVPB 3.375 g     3.375 g 12.5 mL/hr over 240 Minutes Intravenous Every 12 hours 12/19/17 2032     12/19/17 1800  piperacillin-tazobactam (ZOSYN) IVPB 3.375 g     3.375 g 100 mL/hr over 30 Minutes Intravenous  Once 12/19/17 1748 12/19/17 1827      Subjective:   [name] seen and examined today.  Patient states she is feeling much better today.  Denies current chest pain, shortness breath, abdominal pain, nausea vomiting, constipation.  Has been having loose stools although states she is only been taking in liquids.  Objective:   Vitals:   12/20/17 0410 12/20/17 1402 12/20/17 2103 12/21/17 0447  BP: 132/63 (!) 120/55 123/63 113/62  Pulse: 92 95 86 88  Resp: 16 16 20 20   Temp: 98.2 F (36.8  C) 98.1 F (36.7 C) 98.4 F (36.9 C) 98.4 F (36.9 C)  TempSrc: Oral Oral Oral Oral  SpO2: 100% 99% 97% 100%  Weight:      Height:        Intake/Output Summary (Last 24 hours) at 12/21/2017 1325 Last data filed at 12/21/2017 0259 Gross per 24 hour    Intake 1359.93 ml  Output -  Net 1359.93 ml   Filed Weights   12/19/17 1744 12/19/17 2144  Weight: 42.2 kg (93 lb) 42.2 kg (93 lb 0.6 oz)    Exam  General: Well developed, well nourished, NAD, appears stated age  HEENT: NCAT, mucous membranes moist.   Neck: Supple  Cardiovascular: S1 S2 auscultated, no rubs, murmurs or gallops. Regular rate and rhythm.  Respiratory: Clear to auscultation bilaterally with equal chest rise  Abdomen: Soft, mild LLQ TTP, nondistended, + bowel sounds  Extremities: warm dry without cyanosis clubbing or edema  Neuro: AAOx3, nonfocal  Skin: Without rashes exudates or nodules  Psych: Normal affect and demeanor with intact judgement and insight   Data Reviewed: I have personally reviewed following labs and imaging studies  CBC: Recent Labs  Lab 12/19/17 1558 12/21/17 0457  WBC 15.9* 17.3*  HGB 12.1 10.3*  HCT 39.0 32.9*  MCV 90.5 92.2  PLT 427* 355   Basic Metabolic Panel: Recent Labs  Lab 12/19/17 1558 12/19/17 2053 12/21/17 0457  NA 132* 139 140  K 4.8 4.0 3.7  CL 98 107 109  CO2 22 23 23   GLUCOSE 390* 118* 86  BUN 12 10 <5*  CREATININE 1.22* 1.05* 1.03*  CALCIUM 9.0 8.6* 7.8*   GFR: Estimated Creatinine Clearance: 25.3 mL/min (A) (by C-G formula based on SCr of 1.03 mg/dL (H)). Liver Function Tests: Recent Labs  Lab 12/19/17 1558 12/19/17 2053  AST 25 19  ALT 21 18  ALKPHOS 61 55  BILITOT 0.4 0.3  PROT 6.6 5.8*  ALBUMIN 3.2* 3.0*   Recent Labs  Lab 12/19/17 1558  LIPASE 39   No results for input(s): AMMONIA in the last 168 hours. Coagulation Profile: Recent Labs  Lab 12/19/17 2053  INR 1.19   Cardiac Enzymes: No results for input(s): CKTOTAL, CKMB, CKMBINDEX, TROPONINI in the last 168 hours. BNP (last 3 results) No results for input(s): PROBNP in the last 8760 hours. HbA1C: Recent Labs    12/20/17 0005  HGBA1C 7.8*   CBG: Recent Labs  Lab 12/20/17 1146 12/20/17 1632 12/20/17 2139  12/21/17 0834 12/21/17 1235  GLUCAP 136* 95 100* 77 150*   Lipid Profile: No results for input(s): CHOL, HDL, LDLCALC, TRIG, CHOLHDL, LDLDIRECT in the last 72 hours. Thyroid Function Tests: No results for input(s): TSH, T4TOTAL, FREET4, T3FREE, THYROIDAB in the last 72 hours. Anemia Panel: No results for input(s): VITAMINB12, FOLATE, FERRITIN, TIBC, IRON, RETICCTPCT in the last 72 hours. Urine analysis:    Component Value Date/Time   COLORURINE STRAW (A) 12/19/2017 1854   APPEARANCEUR CLEAR 12/19/2017 1854   LABSPEC 1.013 12/19/2017 1854   PHURINE 8.0 12/19/2017 1854   GLUCOSEU 50 (A) 12/19/2017 1854   HGBUR NEGATIVE 12/19/2017 [place] 12/19/2017 [place] 12/19/2017 1854   PROTEINUR NEGATIVE 12/19/2017 1854   NITRITE NEGATIVE 12/19/2017 1854   LEUKOCYTESUR SMALL (A) 12/19/2017 1854   Sepsis Labs: @LABRCNTIP (procalcitonin:4,lacticidven:4)  ) Recent Results (from the past 240 hour(s))  Culture, blood (x 2)     Status: None (Preliminary result)   Collection Time: 12/19/17  9:20 PM  Result Value Ref Range Status   Specimen Description BLOOD RIGHT WRIST  Final   Special Requests   Final    BOTTLES DRAWN AEROBIC AND ANAEROBIC Blood Culture adequate volume   Culture   Final    NO GROWTH 2 DAYS Performed at [facility], [address], [place], [place] [address]    Report Status PENDING  Incomplete  Culture, blood (x 2)     Status: None (Preliminary result)   Collection Time: 12/19/17 11:33 PM  Result Value Ref Range Status   Specimen Description BLOOD RIGHT WRIST  Final   Special Requests   Final    BOTTLES DRAWN AEROBIC AND ANAEROBIC Blood Culture adequate volume   Culture   Final    NO GROWTH 1 DAY Performed at [facility], [place] [address], [place], [place] [address]    Report Status PENDING  Incomplete      Radiology Studies: No results found.   Scheduled Meds: . amLODipine  5 mg Oral Daily  . atorvastatin  20  mg Oral Daily  . cholecalciferol  1,000 Units Oral Daily  . ezetimibe  10 mg Oral Daily  . insulin aspart  0-9 Units Subcutaneous TID WC  . metoprolol succinate  12.5 mg Oral Daily  . multivitamin with minerals  1 tablet Oral Daily  . pantoprazole  40 mg Oral Daily  . psyllium  1 packet Oral QHS  . raloxifene  60 mg Oral Daily  . vitamin B-12  500 mcg Oral Daily  . vitamin C  500 mg Oral Daily   Continuous Infusions: . piperacillin-tazobactam (ZOSYN)  IV 3.375 g (12/21/17 0605)     LOS: 2 days   Time Spent in minutes   30 minutes  Oskar Cretella D.O. on 12/21/2017 at 1:25 PM  Between 7am to 7pm - Pager - [phone]  After 7pm go to [email or web address] - password [email or web address]  And look for the night coverage person covering for me after hours  [facility] Office  [phone]

## 2017-12-21 NOTE — Progress Notes (Signed)
Patient ID: [name], female   DOB: January 07, 1929, 84 y.o.   MRN: [id number]       Subjective: Feels about the same, maybe slightly better.  Still with minimal pain.  Wanting to ambulate more.  I told her that was absolutely fine.  Objective: Vital signs in last 24 hours: Temp:  [98.1 F (36.7 C)-98.4 F (36.9 C)] 98.4 F (36.9 C) (07/26 0447) Pulse Rate:  [86-95] 88 (07/26 0447) Resp:  [16-20] 20 (07/26 0447) BP: (113-123)/(55-63) 113/62 (07/26 0447) SpO2:  [97 %-100 %] 100 % (07/26 0447) Last BM Date: 12/20/17  Intake/Output from previous day: 07/25 0701 - 07/26 0700 In: 1619.9 [P.O.:820; I.V.:799.9] Out: -  Intake/Output this shift: No intake/output data recorded.  PE: Heart: regular Lungs: CTAB Abd: soft, mildly tender in lower mid abdomen and LLQ, no rebounding.  +BS, ND  Lab Results:  Recent Labs    12/19/17 1558 12/21/17 0457  WBC 15.9* 17.3*  HGB 12.1 10.3*  HCT 39.0 32.9*  PLT 427* 315   BMET Recent Labs    12/19/17 2053 12/21/17 0457  NA 139 140  K 4.0 3.7  CL 107 109  CO2 23 23  GLUCOSE 118* 86  BUN 10 <5*  CREATININE 1.05* 1.03*  CALCIUM 8.6* 7.8*   PT/INR Recent Labs    12/19/17 2053  LABPROT 15.0  INR 1.19   CMP     Component Value Date/Time   NA 140 12/21/2017 0457   NA 141 10/17/2016 0945   K 3.7 12/21/2017 0457   CL 109 12/21/2017 0457   CO2 23 12/21/2017 0457   GLUCOSE 86 12/21/2017 0457   BUN <5 (L) 12/21/2017 0457   BUN 14 10/17/2016 0945   CREATININE 1.03 (H) 12/21/2017 0457   CREATININE 0.88 04/10/2016 0953   CALCIUM 7.8 (L) 12/21/2017 0457   PROT 5.8 (L) 12/19/2017 2053   PROT 6.4 10/17/2016 0945   ALBUMIN 3.0 (L) 12/19/2017 2053   ALBUMIN 4.1 10/17/2016 0945   AST 19 12/19/2017 2053   ALT 18 12/19/2017 2053   ALKPHOS 55 12/19/2017 2053   BILITOT 0.3 12/19/2017 2053   BILITOT 0.3 10/17/2016 0945   GFRNONAA 48 (L) 12/21/2017 0457   GFRAA 55 (L) 12/21/2017 0457   Lipase     Component Value Date/Time   LIPASE 39 12/19/2017 1558       Studies/Results: Ct Abdomen Pelvis W Contrast  Result Date: 12/19/2017 CLINICAL DATA:  One month history of LEFT LOWER QUADRANT abdominal pain. Patient is being treated for diverticulitis, and presents with leukocytosis. Personal history of LEFT breast cancer post mastectomy in 1999. Surgical history also includes abdominal hysterectomy. Creatinine was obtained on site at [place] at [address] Results: Creatinine 1.0 mg/dL.  BUN 18.  Estimated GFR 51. EXAM: CT ABDOMEN AND PELVIS WITH CONTRAST TECHNIQUE: Multidetector CT imaging of the abdomen and pelvis was performed using the standard protocol following bolus administration of intravenous contrast. CONTRAST:  103mL ISOVUE-370 IOPAMIDOL INJECTION 76% IV. Oral contrast was not administered. COMPARISON:  10/05/2003. FINDINGS: Lower chest: Mild bronchiectasis involving the RIGHT LOWER LOBE. Visualized lung bases clear. Heart size normal. Hepatobiliary: Liver normal in size and appearance. Gallbladder normal in appearance without calcified gallstones. No biliary ductal dilation. Pancreas: Scattered pancreatic calcifications. 8 mm cystic mass arising from the proximal body of the pancreas (series 2, image 18 and coronal image 32, best seen on the coronal image). No other pancreatic masses. Spleen: Normal in size and appearance. Adrenals/Urinary Tract:  Normal appearing adrenal glands. Kidneys normal in size and appearance without focal parenchymal abnormality. No evidence of urinary tract calculi or obstruction. Normal-appearing decompressed urinary bladder. Stomach/Bowel: Moderate to large hiatal hernia, not significantly changed since the 2005 examination. Stomach otherwise normal in appearance. Normal-appearing small bowel. Opaque ingested material in the terminal ileum and cecum. Extensive sigmoid colon diverticulosis and scattered diverticula involving the distal descending colon. Acute diverticulitis  involving the proximal sigmoid colon. Approximate 4 cm gas containing fluid collection adjacent to the proximal sigmoid colon. No free intraperitoneal air. Normal appearing decompressed appendix in the RIGHT UPPER pelvis. Vascular/Lymphatic: Moderate aortoiliac atherosclerosis without evidence of aneurysm. Normal-appearing portal venous and systemic venous systems. No pathologic lymphadenopathy. Reproductive: Surgically absent uterus.  No adnexal masses. Other: None. Musculoskeletal: Severe degenerative disc disease and spondylosis at L1-2, L2-3, L3-4 and L4-5. Diffuse facet degenerative changes throughout the lumbar spine. Thoracolumbar levoscoliosis. No acute findings. IMPRESSION: 1. Acute/subacute sigmoid diverticulitis with an associated approximate 4 cm abscess adjacent to the proximal sigmoid colon. 2. 8 mm cystic mass involving the proximal body of the pancreas. Recommend follow up pre and post contrast MRI/MRCP or pancreatic protocol CT in 2 years. This recommendation follows ACR consensus guidelines: Management of Incidental Pancreatic Cysts: A White Paper of the ACR Incidental Findings Committee. J Am Coll Radiol 6237;62:831-517. 3. Moderate to large hiatal hernia. 4. RIGHT LOWER LOBE bronchiectasis. 5.  Aortic Atherosclerosis (ICD10-170.0) These results will be called to the ordering clinician or representative by the Radiologist Assistant, and communication documented in the PACS or zVision Dashboard. Electronically Signed   By: Evangeline Dakin M.D.   On: 12/19/2017 12:28    Anti-infectives: Anti-infectives (From admission, onward)   Start     Dose/Rate Route Frequency Ordered Stop   12/20/17 0600  piperacillin-tazobactam (ZOSYN) IVPB 3.375 g     3.375 g 12.5 mL/hr over 240 Minutes Intravenous Every 12 hours 12/19/17 2032     12/19/17 1800  piperacillin-tazobactam (ZOSYN) IVPB 3.375 g     3.375 g 100 mL/hr over 30 Minutes Intravenous  Once 12/19/17 1748 12/19/17 1827        Assessment/Plan Diverticulitis with abscess  -IR has evaluated her CT scan and does not feel there is a safe window currently to place a drain -cont with conservative management.  Will adv to fulls today as abdominal pain is mild.  However, WBC up to 17K today.  Remains AF.  Will need to follow WBC.  If this continues to trend up then she may need a repeat scan, but if it begins to fall then we can hopefully further adv her diet tomorrow. -if she fails, she may need an operation, but it does not currently appear that way.  FEN - clears/IVFs VTE - SCDs/Safe for DVT prophylaxis from our standpoint ID - [email or web address]    LOS: 2 days    Henreitta Cea , [facility] 12/21/2017, 9:42 AM Pager: [phone]

## 2017-12-21 NOTE — Care Management Note (Signed)
Case Management Note  Patient Details  Name: KATILYNN SINKLER MRN: [id number] Date of Birth: Jun 14, 1928  Subjective/Objective:            Patient from home alone. Admitted with abscess from diverticulitis. Sx consult rec medical management with IV Abx. Per staff patient up and walking and independent in room. CM will continue to follow.          Action/Plan:   Expected Discharge Date:                  Expected Discharge Plan:  Home/Self Care  In-House Referral:     Discharge planning Services  CM Consult  Post Acute Care Choice:    Choice offered to:     DME Arranged:    DME Agency:     HH Arranged:    HH Agency:     Status of Service:  In process, will continue to follow  If discussed at Long Length of Stay Meetings, dates discussed:    Additional Comments:  Carles Collet, RN 12/21/2017, 3:16 PM

## 2017-12-22 LAB — CBC
HCT: 32.6 % — ABNORMAL LOW (ref 36.0–46.0)
Hemoglobin: 10.3 g/dL — ABNORMAL LOW (ref 12.0–15.0)
MCH: 28.8 pg (ref 26.0–34.0)
MCHC: 31.6 g/dL (ref 30.0–36.0)
MCV: 91.1 fL (ref 78.0–100.0)
Platelets: 306 10*3/uL (ref 150–400)
RBC: 3.58 MIL/uL — AB (ref 3.87–5.11)
RDW: 13 % (ref 11.5–15.5)
WBC: 17 10*3/uL — AB (ref 4.0–10.5)

## 2017-12-22 LAB — GLUCOSE, CAPILLARY
GLUCOSE-CAPILLARY: 104 mg/dL — AB (ref 70–99)
GLUCOSE-CAPILLARY: 217 mg/dL — AB (ref 70–99)
Glucose-Capillary: 155 mg/dL — ABNORMAL HIGH (ref 70–99)
Glucose-Capillary: 126 mg/dL — ABNORMAL HIGH (ref 70–99)

## 2017-12-22 NOTE — Progress Notes (Signed)
Patient ID: [name], female   DOB: [birth year], 84 y.o.   MRN: [id number] [facility] Surgery Progress Note:   * No surgery found *  Subjective: Mental status is clear.  Up in the bathroom-getting around very well. Objective: Vital signs in last 24 hours: Temp:  [98.1 F (36.7 C)-98.8 F (37.1 C)] 98.8 F (37.1 C) (07/27 0447) Pulse Rate:  [96-105] 96 (07/27 0447) Resp:  [18] 18 (07/27 0447) BP: (115-140)/(57-73) 115/57 (07/27 0447) SpO2:  [97 %-100 %] 98 % (07/27 0447)  Intake/Output from previous day: 07/26 0701 - 07/27 0700 In: 120 [P.O.:120] Out: -  Intake/Output this shift: No intake/output data recorded.  Physical Exam: Work of breathing is normal.    Lab Results:  Results for orders placed or performed during the hospital encounter of 12/19/17 (from the past 48 hour(s))  Glucose, capillary     Status: Abnormal   Collection Time: 12/20/17 11:46 AM  Result Value Ref Range   Glucose-Capillary 136 (H) 70 - 99 mg/dL  Glucose, capillary     Status: None   Collection Time: 12/20/17  4:32 PM  Result Value Ref Range   Glucose-Capillary 95 70 - 99 mg/dL  Glucose, capillary     Status: Abnormal   Collection Time: 12/20/17  9:39 PM  Result Value Ref Range   Glucose-Capillary 100 (H) 70 - 99 mg/dL  CBC     Status: Abnormal   Collection Time: 12/21/17  4:57 AM  Result Value Ref Range   WBC 17.3 (H) 4.0 - 10.5 K/uL   RBC 3.57 (L) 3.87 - 5.11 MIL/uL   Hemoglobin 10.3 (L) 12.0 - 15.0 g/dL   HCT 32.9 (L) 36.0 - 46.0 %   MCV 92.2 78.0 - 100.0 fL   MCH 28.9 26.0 - 34.0 pg   MCHC 31.3 30.0 - 36.0 g/dL   RDW 12.8 11.5 - 15.5 %   Platelets 315 150 - 400 K/uL    Comment: Performed at [facility], [place] [address], [place], [place] [address]  Basic metabolic panel     Status: Abnormal   Collection Time: 12/21/17  4:57 AM  Result Value Ref Range   Sodium 140 135 - 145 mmol/L   Potassium 3.7 3.5 - 5.1 mmol/L   Chloride 109 98 - 111 mmol/L   CO2 23 22 - 32 mmol/L   Glucose, Bld 86 70 - 99 mg/dL   BUN <5 (L) 8 - 23 mg/dL   Creatinine, Ser 1.03 (H) 0.44 - 1.00 mg/dL   Calcium 7.8 (L) 8.9 - 10.3 mg/dL   GFR calc non Af Amer 48 (L) >60 mL/min   GFR calc Af Amer 55 (L) >60 mL/min    Comment: (NOTE) The eGFR has been calculated using the CKD EPI equation. This calculation has not been validated in all clinical situations. eGFR's persistently <60 mL/min signify possible Chronic Kidney Disease.    Anion gap 8 5 - 15    Comment: Performed at [place] [address], [place], [place] [address]  Glucose, capillary     Status: None   Collection Time: 12/21/17  8:34 AM  Result Value Ref Range   Glucose-Capillary 77 70 - 99 mg/dL  Glucose, capillary     Status: Abnormal   Collection Time: 12/21/17 12:35 PM  Result Value Ref Range   Glucose-Capillary 150 (H) 70 - 99 mg/dL  Glucose, capillary     Status: Abnormal   Collection Time: 12/21/17  4:48 PM  Result Value Ref Range   Glucose-Capillary 158 (H) 70 - 99 mg/dL  Glucose, capillary     Status: Abnormal   Collection Time: 12/21/17  8:17 PM  Result Value Ref Range   Glucose-Capillary 195 (H) 70 - 99 mg/dL  CBC     Status: Abnormal   Collection Time: 12/22/17  5:24 AM  Result Value Ref Range   WBC 17.0 (H) 4.0 - 10.5 K/uL   RBC 3.58 (L) 3.87 - 5.11 MIL/uL   Hemoglobin 10.3 (L) 12.0 - 15.0 g/dL   HCT 32.6 (L) 36.0 - 46.0 %   MCV 91.1 78.0 - 100.0 fL   MCH 28.8 26.0 - 34.0 pg   MCHC 31.6 30.0 - 36.0 g/dL   RDW 13.0 11.5 - 15.5 %   Platelets 306 150 - 400 K/uL    Comment: Performed at [facility], [place] [address], [place], [place] [address]  Glucose, capillary     Status: Abnormal   Collection Time: 12/22/17  8:11 AM  Result Value Ref Range   Glucose-Capillary 104 (H) 70 - 99 mg/dL   Comment 1 Notify RN     Radiology/Results: No results found.  Anti-infectives: Anti-infectives (From admission, onward)   Start     Dose/Rate Route Frequency Ordered Stop   12/20/17 0600   piperacillin-tazobactam (ZOSYN) IVPB 3.375 g     3.375 g 12.5 mL/hr over 240 Minutes Intravenous Every 12 hours 12/19/17 2032     12/19/17 1800  piperacillin-tazobactam (ZOSYN) IVPB 3.375 g     3.375 g 100 mL/hr over 30 Minutes Intravenous  Once 12/19/17 1748 12/19/17 1827      Assessment/Plan: Problem List: Patient Active Problem List   Diagnosis Date Noted  . Diverticulitis of large intestine with abscess 12/19/2017  . Sepsis ([place]) 12/19/2017  . HTN (hypertension) 12/19/2017  . History of prediabetes 12/19/2017  . GERD (gastroesophageal reflux disease) 12/19/2017  . Anxiety 12/19/2017  . Acute renal failure superimposed on stage 2 chronic kidney disease ([place]) 12/19/2017  . Type II diabetes mellitus with renal manifestations ([place]) 12/19/2017  . Pancreatic cyst 12/19/2017  . Hyperglycemia   . Aortic systolic murmur on examination 04/08/2015  . Malaise and fatigue 04/15/2012  . Right carotid bruit 10/05/2011  . Hiatal hernia 04/05/2011  . Benign hypertensive heart disease without heart failure 09/01/2010  . Hypercholesterolemia 09/01/2010  . History of breast cancer 09/01/2010  . Osteopenia 09/01/2010    WBC staying around 17K.  Continue to follow.   * No surgery found *    LOS: 3 days   Matt B. Hassell Done, MD, [facility] [phone] beeper [phone]  12/22/2017 8:57 AM

## 2017-12-22 NOTE — Progress Notes (Signed)
PROGRESS NOTE  [name] ZDG:[id number] DOB: Aug 11, 1928 DOA: 12/19/2017 PCP: Crist Infante, MD   LOS: 3 days   Brief Narrative / Interim history: 83 year old female, quite independent, living alone, driving and able to do all her ADLs, with history of hypertension, hyperlipidemia, diabetes, history of breast cancer who presents to the hospital with abdominal pain.  Patient has been having abdominal pain for the past 2 to 3 weeks, mainly in the left lower quadrant.  She was evaluated in outpatient and eventually started on ciprofloxacin and metronidazole, in the ED she underwent a CT scan and she took that for a few days.  On follow-up visit she had blood work which showed a leukocytosis of 15, she is still having pain and was directed to the emergency room for further evaluation.  Of the abdomen and pelvis which showed the presence of diverticulitis as well as a diverticular abscess.  General surgery was consulted and patient was admitted to the hospital.  Assessment & Plan: Principal Problem:   Diverticulitis of large intestine with abscess Active Problems:   Hypercholesterolemia   Sepsis ([place])   HTN (hypertension)   History of prediabetes   GERD (gastroesophageal reflux disease)   Anxiety   Acute renal failure superimposed on stage 2 chronic kidney disease (HCC)   Type II diabetes mellitus with renal manifestations ([place])   Pancreatic cyst   Diverticulitis of large intestine with abscess -I do not think she was septic on admission, her tachycardia and tachypnea were not sustained.  Only thing she had was leukocytosis.  Currently she is hemodynamically stable, she was started on IV Zosyn which will be continued -CT scan on admission showed an abscess, per IR there is no safe window to drain and recommended antibiotics alone -General surgery following, discussed with Dr. Hassell Done, for now monitor on IV antibiotics, monitor leukocytosis, and reevaluate whether repeat imaging is needed on  Monday -Has persistent leukocytosis, white count 17 today  Hyperlipidemia -Continue home medications  Hypertension -Blood pressure stable on Norvasc and metoprolol alone, continue to hold Cozaar  Anxiety -PRN Xanax  Chronic kidney disease stage II -Creatinine at baseline yesterday, will recheck tomorrow morning  History of prediabetes -CBGs fairly stable, fasting this morning 104, continue sliding scale  Pancreatic cyst -Incidental finding, will need outpatient follow-up per radiology recommendations  DVT prophylaxis: SCDs Code Status: Full code Family Communication: No family at bedside, patient does not seem to have any family, she has a friend who is the POA Disposition Plan: Home when ready   Consultants:   General surgery  IR  Procedures:   None   Antimicrobials:  Zosyn 7/24 >>   Subjective: -Overall she is feeling well, ambulating in the room, no nausea or vomiting.  Denies abdominal pain  Objective: Vitals:   12/21/17 1442 12/21/17 2016 12/22/17 0447 12/22/17 1043  BP: 140/73 136/64 (!) 115/57 130/62  Pulse: (!) 105 97 96 90  Resp: 18  18 16   Temp: 98.1 F (36.7 C) 98.2 F (36.8 C) 98.8 F (37.1 C) 98.6 F (37 C)  TempSrc: Oral Oral Oral Oral  SpO2: 100% 97% 98% 97%  Weight:      Height:        Intake/Output Summary (Last 24 hours) at 12/22/2017 1236 Last data filed at 12/22/2017 1139 Gross per 24 hour  Intake 412.73 ml  Output -  Net 412.73 ml   Filed Weights   12/19/17 1744 12/19/17 2144  Weight: 42.2 kg (93 lb) 42.2 kg (93 lb  0.6 oz)    Examination:  Constitutional: No distress, ambulating in the room Eyes: No scleral icterus Respiratory: Clear to auscultation bilaterally without wheezing or crackles Cardiovascular: Regular rate and rhythm, no murmurs heard.  No peripheral edema. Abdomen: Soft, mild tenderness to palpation in the left lower quadrant without guarding or rebound, positive bowel sounds Skin: No rashes Neurologic:  Nonfocal  Data Reviewed: I have independently reviewed following labs and imaging studies   CBC: Recent Labs  Lab 12/19/17 1558 12/21/17 0457 12/22/17 0524  WBC 15.9* 17.3* 17.0*  HGB 12.1 10.3* 10.3*  HCT 39.0 32.9* 32.6*  MCV 90.5 92.2 91.1  PLT 427* 315 794   Basic Metabolic Panel: Recent Labs  Lab 12/19/17 1558 12/19/17 2053 12/21/17 0457  NA 132* 139 140  K 4.8 4.0 3.7  CL 98 107 109  CO2 22 23 23   GLUCOSE 390* 118* 86  BUN 12 10 <5*  CREATININE 1.22* 1.05* 1.03*  CALCIUM 9.0 8.6* 7.8*   GFR: Estimated Creatinine Clearance: 24.8 mL/min (A) (by C-G formula based on SCr of 1.03 mg/dL (H)). Liver Function Tests: Recent Labs  Lab 12/19/17 1558 12/19/17 2053  AST 25 19  ALT 21 18  ALKPHOS 61 55  BILITOT 0.4 0.3  PROT 6.6 5.8*  ALBUMIN 3.2* 3.0*   Recent Labs  Lab 12/19/17 1558  LIPASE 39   No results for input(s): AMMONIA in the last 168 hours. Coagulation Profile: Recent Labs  Lab 12/19/17 2053  INR 1.19   Cardiac Enzymes: No results for input(s): CKTOTAL, CKMB, CKMBINDEX, TROPONINI in the last 168 hours. BNP (last 3 results) No results for input(s): PROBNP in the last 8760 hours. HbA1C: Recent Labs    12/20/17 0005  HGBA1C 7.8*   CBG: Recent Labs  Lab 12/21/17 1235 12/21/17 1648 12/21/17 2017 12/22/17 0811 12/22/17 1200  GLUCAP 150* 158* 195* 104* 217*   Lipid Profile: No results for input(s): CHOL, HDL, LDLCALC, TRIG, CHOLHDL, LDLDIRECT in the last 72 hours. Thyroid Function Tests: No results for input(s): TSH, T4TOTAL, FREET4, T3FREE, THYROIDAB in the last 72 hours. Anemia Panel: No results for input(s): VITAMINB12, FOLATE, FERRITIN, TIBC, IRON, RETICCTPCT in the last 72 hours. Urine analysis:    Component Value Date/Time   COLORURINE STRAW (A) 12/19/2017 1854   APPEARANCEUR CLEAR 12/19/2017 1854   LABSPEC 1.013 12/19/2017 1854   PHURINE 8.0 12/19/2017 1854   GLUCOSEU 50 (A) 12/19/2017 1854   HGBUR NEGATIVE 12/19/2017 [place] NEGATIVE 12/19/2017 [place] 12/19/2017 1854   PROTEINUR NEGATIVE 12/19/2017 1854   NITRITE NEGATIVE 12/19/2017 1854   LEUKOCYTESUR SMALL (A) 12/19/2017 1854   Sepsis Labs: Invalid input(s): PROCALCITONIN, LACTICIDVEN  Recent Results (from the past 240 hour(s))  Culture, blood (x 2)     Status: None (Preliminary result)   Collection Time: 12/19/17  9:20 PM  Result Value Ref Range Status   Specimen Description BLOOD RIGHT WRIST  Final   Special Requests   Final    BOTTLES DRAWN AEROBIC AND ANAEROBIC Blood Culture adequate volume   Culture   Final    NO GROWTH 3 DAYS Performed at [facility], [address], [place], [place] [address]    Report Status PENDING  Incomplete  Culture, blood (x 2)     Status: None (Preliminary result)   Collection Time: 12/19/17 11:33 PM  Result Value Ref Range Status   Specimen Description BLOOD RIGHT WRIST  Final   Special Requests   Final  BOTTLES DRAWN AEROBIC AND ANAEROBIC Blood Culture adequate volume   Culture   Final    NO GROWTH 2 DAYS Performed at [facility], [place] [address], [place], [place] [address]    Report Status PENDING  Incomplete      Radiology Studies: No results found.   Scheduled Meds: . amLODipine  5 mg Oral Daily  . atorvastatin  20 mg Oral Daily  . cholecalciferol  1,000 Units Oral Daily  . ezetimibe  10 mg Oral Daily  . insulin aspart  0-9 Units Subcutaneous TID WC  . metoprolol succinate  12.5 mg Oral Daily  . multivitamin with minerals  1 tablet Oral Daily  . pantoprazole  40 mg Oral Daily  . psyllium  1 packet Oral QHS  . raloxifene  60 mg Oral Daily  . vitamin B-12  500 mcg Oral Daily  . vitamin C  500 mg Oral Daily   Continuous Infusions: . piperacillin-tazobactam (ZOSYN)  IV Stopped (12/22/17 1049)     Marzetta Board, MD, PhD [facility] Pager [phone]  If 7PM-7AM, please contact night-coverage [email or web address] Password [facility] 12/22/2017,  12:36 PM

## 2017-12-23 LAB — CBC
HEMATOCRIT: 34.4 % — AB (ref 36.0–46.0)
Hemoglobin: 11.1 g/dL — ABNORMAL LOW (ref 12.0–15.0)
MCH: 29.1 pg (ref 26.0–34.0)
MCHC: 32.3 g/dL (ref 30.0–36.0)
MCV: 90.3 fL (ref 78.0–100.0)
PLATELETS: 308 10*3/uL (ref 150–400)
RBC: 3.81 MIL/uL — ABNORMAL LOW (ref 3.87–5.11)
RDW: 13 % (ref 11.5–15.5)
WBC: 14.5 10*3/uL — ABNORMAL HIGH (ref 4.0–10.5)

## 2017-12-23 LAB — BASIC METABOLIC PANEL
Anion gap: 5 (ref 5–15)
BUN: <5 mg/dL — ABNORMAL LOW (ref 8–23)
CO2: 27 mmol/L (ref 22–32)
CREATININE: 1 mg/dL (ref 0.44–1.00)
Calcium: 8.3 mg/dL — ABNORMAL LOW (ref 8.9–10.3)
Chloride: 108 mmol/L (ref 98–111)
GFR calc Af Amer: 57 mL/min — ABNORMAL LOW (ref >60)
GFR calc non Af Amer: 49 mL/min — ABNORMAL LOW (ref >60)
GLUCOSE: 98 mg/dL (ref 70–99)
POTASSIUM: 3.7 mmol/L (ref 3.5–5.1)
SODIUM: 140 mmol/L (ref 135–145)

## 2017-12-23 LAB — GLUCOSE, CAPILLARY
GLUCOSE-CAPILLARY: 153 mg/dL — AB (ref 70–99)
Glucose-Capillary: 103 mg/dL — ABNORMAL HIGH (ref 70–99)
Glucose-Capillary: 255 mg/dL — ABNORMAL HIGH (ref 70–99)
Glucose-Capillary: 197 mg/dL — ABNORMAL HIGH (ref 70–99)

## 2017-12-23 NOTE — Progress Notes (Signed)
PROGRESS NOTE  [name] HFW:[id number] DOB: 09/13/1928 DOA: 12/19/2017 PCP: Crist Infante, MD   LOS: 4 days   Brief Narrative / Interim history: 84 year old female, quite independent, living alone, driving and able to do all her ADLs, with history of hypertension, hyperlipidemia, diabetes, history of breast cancer who presents to the hospital with abdominal pain.  Patient has been having abdominal pain for the past 2 to 3 weeks, mainly in the left lower quadrant.  She was evaluated in outpatient and eventually started on ciprofloxacin and metronidazole, in the ED she underwent a CT scan and she took that for a few days.  On follow-up visit she had blood work which showed a leukocytosis of 15, she is still having pain and was directed to the emergency room for further evaluation.  Of the abdomen and pelvis which showed the presence of diverticulitis as well as a diverticular abscess.  General surgery was consulted and patient was admitted to the hospital.  Assessment & Plan: Principal Problem:   Diverticulitis of large intestine with abscess Active Problems:   Hypercholesterolemia   Sepsis ([employer])   HTN (hypertension)   History of prediabetes   GERD (gastroesophageal reflux disease)   Anxiety   Acute renal failure superimposed on stage 2 chronic kidney disease (HCC)   Type II diabetes mellitus with renal manifestations ([place])   Pancreatic cyst   Diverticulitis of large intestine with abscess -I do not think she was septic on admission, her tachycardia and tachypnea were not sustained.  Only thing she had was leukocytosis.  Currently she is hemodynamically stable, she was started on IV Zosyn which will be continued -CT scan on admission showed an abscess, per IR there is no safe window to drain and recommended antibiotics alone -General surgery following, discussed with Dr. Hassell Done again this morning, keep on liquid diet, potentially rescan her abdomen in a day or 2 -White count improving  today and is down to 14  Hyperlipidemia -Continue home medications  Hypertension -Blood pressure within normal parameters, continue current regimen as below  Anxiety -PRN Xanax  Chronic kidney disease stage II -Creatinine remains at baseline at 1.00  History of prediabetes -CBGs stable, fasting 98 this morning, continue current regimen  Pancreatic cyst -Incidental finding, will need outpatient follow-up per radiology recommendations  DVT prophylaxis: SCDs Code Status: Full code Family Communication: No family at bedside, patient does not seem to have any family, she has a friend who is the POA Disposition Plan: Home when ready   Consultants:   General surgery  IR  Procedures:   None   Antimicrobials:  Zosyn 7/24 >>   Subjective: -No major complaints other than the food choices are limited on a liquid diet, states that her abdominal pain is "about the same", no nausea or vomiting.  Complains of a poor appetite.  No chest pain or shortness of breath  Objective: Vitals:   12/22/17 1043 12/22/17 1448 12/22/17 2023 12/23/17 0433  BP: 130/62 126/61 129/65 137/85  Pulse: 90 (!) 105 91 97  Resp: 16 16 16 16   Temp: 98.6 F (37 C) 98.2 F (36.8 C) 97.8 F (36.6 C) 98.2 F (36.8 C)  TempSrc: Oral Oral Oral Oral  SpO2: 97% 97% 100% 98%  Weight:      Height:        Intake/Output Summary (Last 24 hours) at 12/23/2017 0908 Last data filed at 12/22/2017 2023 Gross per 24 hour  Intake 992.73 ml  Output -  Net 992.73 ml  Filed Weights   12/19/17 1744 12/19/17 2144  Weight: 42.2 kg (93 lb) 42.2 kg (93 lb 0.6 oz)    Examination:  Constitutional: NAD Eyes: No scleral icterus, lids and conjunctivae normal Respiratory: CTA bilaterally, no wheezing or crackles Cardiovascular: Regular rate and rhythm, no MRG.  No peripheral edema Abdomen: Soft, left lower quadrant tenderness to palpation seems improved today, no rebound or guarding, bowel sounds positive Skin: No  rashes seen Neurologic: Ambulatory, grossly nonfocal  Data Reviewed: I have independently reviewed following labs and imaging studies   CBC: Recent Labs  Lab 12/19/17 1558 12/21/17 0457 12/22/17 0524 12/23/17 0458  WBC 15.9* 17.3* 17.0* 14.5*  HGB 12.1 10.3* 10.3* 11.1*  HCT 39.0 32.9* 32.6* 34.4*  MCV 90.5 92.2 91.1 90.3  PLT 427* 315 306 194   Basic Metabolic Panel: Recent Labs  Lab 12/19/17 1558 12/19/17 2053 12/21/17 0457 12/23/17 0458  NA 132* 139 140 140  K 4.8 4.0 3.7 3.7  CL 98 107 109 108  CO2 22 23 23 27   GLUCOSE 390* 118* 86 98  BUN 12 10 <5* <5*  CREATININE 1.22* 1.05* 1.03* 1.00  CALCIUM 9.0 8.6* 7.8* 8.3*   GFR: Estimated Creatinine Clearance: 25.6 mL/min (by C-G formula based on SCr of 1 mg/dL). Liver Function Tests: Recent Labs  Lab 12/19/17 1558 12/19/17 2053  AST 25 19  ALT 21 18  ALKPHOS 61 55  BILITOT 0.4 0.3  PROT 6.6 5.8*  ALBUMIN 3.2* 3.0*   Recent Labs  Lab 12/19/17 1558  LIPASE 39   No results for input(s): AMMONIA in the last 168 hours. Coagulation Profile: Recent Labs  Lab 12/19/17 2053  INR 1.19   Cardiac Enzymes: No results for input(s): CKTOTAL, CKMB, CKMBINDEX, TROPONINI in the last 168 hours. BNP (last 3 results) No results for input(s): PROBNP in the last 8760 hours. HbA1C: No results for input(s): HGBA1C in the last 72 hours. CBG: Recent Labs  Lab 12/22/17 0811 12/22/17 1200 12/22/17 1704 12/22/17 2021 12/23/17 0742  GLUCAP 104* 217* 155* 126* 103*   Lipid Profile: No results for input(s): CHOL, HDL, LDLCALC, TRIG, CHOLHDL, LDLDIRECT in the last 72 hours. Thyroid Function Tests: No results for input(s): TSH, T4TOTAL, FREET4, T3FREE, THYROIDAB in the last 72 hours. Anemia Panel: No results for input(s): VITAMINB12, FOLATE, FERRITIN, TIBC, IRON, RETICCTPCT in the last 72 hours. Urine analysis:    Component Value Date/Time   COLORURINE STRAW (A) 12/19/2017 1854   APPEARANCEUR CLEAR 12/19/2017 1854    LABSPEC 1.013 12/19/2017 1854   PHURINE 8.0 12/19/2017 1854   GLUCOSEU 50 (A) 12/19/2017 1854   HGBUR NEGATIVE 12/19/2017 [place] NEGATIVE 12/19/2017 [place] 12/19/2017 1854   PROTEINUR NEGATIVE 12/19/2017 1854   NITRITE NEGATIVE 12/19/2017 1854   LEUKOCYTESUR SMALL (A) 12/19/2017 1854   Sepsis Labs: Invalid input(s): PROCALCITONIN, LACTICIDVEN  Recent Results (from the past 240 hour(s))  Culture, blood (x 2)     Status: None (Preliminary result)   Collection Time: 12/19/17  9:20 PM  Result Value Ref Range Status   Specimen Description BLOOD RIGHT WRIST  Final   Special Requests   Final    BOTTLES DRAWN AEROBIC AND ANAEROBIC Blood Culture adequate volume   Culture   Final    NO GROWTH 3 DAYS Performed at [facility], [address], [place], [place] [address]    Report Status PENDING  Incomplete  Culture, blood (x 2)     Status: None (Preliminary  result)   Collection Time: 12/19/17 11:33 PM  Result Value Ref Range Status   Specimen Description BLOOD RIGHT WRIST  Final   Special Requests   Final    BOTTLES DRAWN AEROBIC AND ANAEROBIC Blood Culture adequate volume   Culture   Final    NO GROWTH 2 DAYS Performed at [facility], [address], [place], [place] [address]    Report Status PENDING  Incomplete      Radiology Studies: No results found.   Scheduled Meds: . amLODipine  5 mg Oral Daily  . atorvastatin  20 mg Oral Daily  . cholecalciferol  1,000 Units Oral Daily  . ezetimibe  10 mg Oral Daily  . insulin aspart  0-9 Units Subcutaneous TID WC  . metoprolol succinate  12.5 mg Oral Daily  . multivitamin with minerals  1 tablet Oral Daily  . pantoprazole  40 mg Oral Daily  . psyllium  1 packet Oral QHS  . raloxifene  60 mg Oral Daily  . vitamin B-12  500 mcg Oral Daily  . vitamin C  500 mg Oral Daily   Continuous Infusions: . piperacillin-tazobactam (ZOSYN)  IV 3.375 g (12/23/17 4801)     Marzetta Board, MD,  PhD [facility] Pager [phone]  If 7PM-7AM, please contact night-coverage [email or web address] Password [email or web address] 12/23/2017, 9:08 AM

## 2017-12-23 NOTE — Progress Notes (Signed)
Patient ID: [name], female   DOB: 1929/05/13, 84 y.o.   MRN: [id number] [facility] Surgery Progress Note:   * No surgery found *  Subjective: Mental status is clear and very pleasant;  She had a [birth year] yesterday.   Objective: Vital signs in last 24 hours: Temp:  [97.8 F (36.6 C)-98.6 F (37 C)] 98.2 F (36.8 C) (07/28 0433) Pulse Rate:  [90-105] 97 (07/28 0433) Resp:  [16] 16 (07/28 0433) BP: (126-137)/(61-85) 137/85 (07/28 0433) SpO2:  [97 %-100 %] 98 % (07/28 0433)  Intake/Output from previous day: 07/27 0701 - 07/28 0700 In: 992.7 [P.O.:700; IV Piggyback:292.7] Out: -  Intake/Output this shift: No intake/output data recorded.  Physical Exam: Work of breathing is normal;  Pain less;  wBC down  Lab Results:  Results for orders placed or performed during the hospital encounter of 12/19/17 (from the past 48 hour(s))  Glucose, capillary     Status: Abnormal   Collection Time: 12/21/17 12:35 PM  Result Value Ref Range   Glucose-Capillary 150 (H) 70 - 99 mg/dL  Glucose, capillary     Status: Abnormal   Collection Time: 12/21/17  4:48 PM  Result Value Ref Range   Glucose-Capillary 158 (H) 70 - 99 mg/dL  Glucose, capillary     Status: Abnormal   Collection Time: 12/21/17  8:17 PM  Result Value Ref Range   Glucose-Capillary 195 (H) 70 - 99 mg/dL  CBC     Status: Abnormal   Collection Time: 12/22/17  5:24 AM  Result Value Ref Range   WBC 17.0 (H) 4.0 - 10.5 K/uL   RBC 3.58 (L) 3.87 - 5.11 MIL/uL   Hemoglobin 10.3 (L) 12.0 - 15.0 g/dL   HCT 32.6 (L) 36.0 - 46.0 %   MCV 91.1 78.0 - 100.0 fL   MCH 28.8 26.0 - 34.0 pg   MCHC 31.6 30.0 - 36.0 g/dL   RDW 13.0 11.5 - 15.5 %   Platelets 306 150 - 400 K/uL    Comment: Performed at [facility], [place] [address] [place], [place] [address]  Glucose, capillary     Status: Abnormal   Collection Time: 12/22/17  8:11 AM  Result Value Ref Range   Glucose-Capillary 104 (H) 70 - 99 mg/dL   Comment 1 Notify RN    Glucose, capillary     Status: Abnormal   Collection Time: 12/22/17 12:00 PM  Result Value Ref Range   Glucose-Capillary 217 (H) 70 - 99 mg/dL   Comment 1 Notify RN   Glucose, capillary     Status: Abnormal   Collection Time: 12/22/17  5:04 PM  Result Value Ref Range   Glucose-Capillary 155 (H) 70 - 99 mg/dL   Comment 1 Notify RN   Glucose, capillary     Status: Abnormal   Collection Time: 12/22/17  8:21 PM  Result Value Ref Range   Glucose-Capillary 126 (H) 70 - 99 mg/dL  Basic metabolic panel     Status: Abnormal   Collection Time: 12/23/17  4:58 AM  Result Value Ref Range   Sodium 140 135 - 145 mmol/L   Potassium 3.7 3.5 - 5.1 mmol/L   Chloride 108 98 - 111 mmol/L   CO2 27 22 - 32 mmol/L   Glucose, Bld 98 70 - 99 mg/dL   BUN <5 (L) 8 - 23 mg/dL   Creatinine, Ser 1.00 0.44 - 1.00 mg/dL   Calcium 8.3 (L) 8.9 - 10.3 mg/dL   GFR calc  non Af Amer 49 (L) >60 mL/min   GFR calc Af Amer 57 (L) >60 mL/min    Comment: (NOTE) The eGFR has been calculated using the CKD EPI equation. This calculation has not been validated in all clinical situations. eGFR's persistently <60 mL/min signify possible Chronic Kidney Disease.    Anion gap 5 5 - 15    Comment: Performed at [facility] [address], [place], [place] [address]  CBC     Status: Abnormal   Collection Time: 12/23/17  4:58 AM  Result Value Ref Range   WBC 14.5 (H) 4.0 - 10.5 K/uL   RBC 3.81 (L) 3.87 - 5.11 MIL/uL   Hemoglobin 11.1 (L) 12.0 - 15.0 g/dL   HCT 34.4 (L) 36.0 - 46.0 %   MCV 90.3 78.0 - 100.0 fL   MCH 29.1 26.0 - 34.0 pg   MCHC 32.3 30.0 - 36.0 g/dL   RDW 13.0 11.5 - 15.5 %   Platelets 308 150 - 400 K/uL    Comment: Performed at [facility], [place] [address], [place], [place] [address]  Glucose, capillary     Status: Abnormal   Collection Time: 12/23/17  7:42 AM  Result Value Ref Range   Glucose-Capillary 103 (H) 70 - 99 mg/dL    Radiology/Results: No results  found.  Anti-infectives: Anti-infectives (From admission, onward)   Start     Dose/Rate Route Frequency Ordered Stop   12/20/17 0600  piperacillin-tazobactam (ZOSYN) IVPB 3.375 g     3.375 g 12.5 mL/hr over 240 Minutes Intravenous Every 12 hours 12/19/17 2032     12/19/17 1800  piperacillin-tazobactam (ZOSYN) IVPB 3.375 g     3.375 g 100 mL/hr over 30 Minutes Intravenous  Once 12/19/17 1748 12/19/17 1827      Assessment/Plan: Problem List: Patient Active Problem List   Diagnosis Date Noted  . Diverticulitis of large intestine with abscess 12/19/2017  . Sepsis ([place]) 12/19/2017  . HTN (hypertension) 12/19/2017  . History of prediabetes 12/19/2017  . GERD (gastroesophageal reflux disease) 12/19/2017  . Anxiety 12/19/2017  . Acute renal failure superimposed on stage 2 chronic kidney disease ([place]) 12/19/2017  . Type II diabetes mellitus with renal manifestations ([place]) 12/19/2017  . Pancreatic cyst 12/19/2017  . Hyperglycemia   . Aortic systolic murmur on examination 04/08/2015  . Malaise and fatigue 04/15/2012  . Right carotid bruit 10/05/2011  . Hiatal hernia 04/05/2011  . Benign hypertensive heart disease without heart failure 09/01/2010  . Hypercholesterolemia 09/01/2010  . History of breast cancer 09/01/2010  . Osteopenia 09/01/2010    Improvement;  Would keep on liquids and rescan ~ mon Tues to assess abscess for aspiration * No surgery found *    LOS: 4 days   Matt B. Hassell Done, MD, [facility] [phone] beeper [phone]  12/23/2017 8:53 AM

## 2017-12-24 LAB — CBC
HEMATOCRIT: 40.2 % (ref 36.0–46.0)
Hemoglobin: 13 g/dL (ref 12.0–15.0)
MCH: 29 pg (ref 26.0–34.0)
MCHC: 32.3 g/dL (ref 30.0–36.0)
MCV: 89.7 fL (ref 78.0–100.0)
PLATELETS: 427 10*3/uL — AB (ref 150–400)
RBC: 4.48 MIL/uL (ref 3.87–5.11)
RDW: 13.1 % (ref 11.5–15.5)
WBC: 12.5 10*3/uL — ABNORMAL HIGH (ref 4.0–10.5)

## 2017-12-24 LAB — CULTURE, BLOOD (ROUTINE X 2)
Culture: NO GROWTH
Special Requests: ADEQUATE

## 2017-12-24 LAB — GLUCOSE, CAPILLARY
GLUCOSE-CAPILLARY: 140 mg/dL — AB (ref 70–99)
GLUCOSE-CAPILLARY: 145 mg/dL — AB (ref 70–99)
GLUCOSE-CAPILLARY: 166 mg/dL — AB (ref 70–99)
Glucose-Capillary: 119 mg/dL — ABNORMAL HIGH (ref 70–99)

## 2017-12-24 MED ORDER — SACCHAROMYCES BOULARDII 250 MG PO CAPS
250.0000 mg | ORAL_CAPSULE | Freq: Two times a day (BID) | ORAL | Status: DC
  Administered 2017-12-24 – 2017-12-28 (×8): 250 mg via ORAL
  Filled 2017-12-24 (×8): qty 1

## 2017-12-24 MED ORDER — ALUM & MAG HYDROXIDE-SIMETH 200-200-20 MG/5ML PO SUSP
30.0000 mL | ORAL | Status: DC | PRN
  Administered 2017-12-24 – 2017-12-25 (×3): 30 mL via ORAL
  Filled 2017-12-24 (×3): qty 30

## 2017-12-24 MED ORDER — GLUCERNA SHAKE PO LIQD
237.0000 mL | Freq: Three times a day (TID) | ORAL | Status: DC
  Administered 2017-12-24 – 2017-12-28 (×6): 237 mL via ORAL

## 2017-12-24 NOTE — Progress Notes (Signed)
Initial Nutrition Assessment  DOCUMENTATION CODES:   Non-severe (moderate) malnutrition in context of acute illness/injury  INTERVENTION:   Glucerna Shake po TID, each supplement provides 220 kcal and 10 grams of protein  Food preferences on FL diet obtained; RD entered pt meals for today and tomorrow AM  Continue MVI  NUTRITION DIAGNOSIS:   Moderate Malnutrition related to acute illness(diverticulitis with abscess) as evidenced by energy intake < or equal to 50% for > or equal to 5 days, percent weight loss, mild fat depletion.  GOAL:   Patient will meet greater than or equal to 90% of their needs  MONITOR:   PO intake, Labs, Weight trends, Supplement acceptance  REASON FOR ASSESSMENT:   Rounds    ASSESSMENT:    84 yo female admitted with diverticulitis of large intestine with abscess. Pt with hx of HTN, HLD, CKD II, DM, pancreatic cyst   Surgery following, no surgical intervention at this time, abscess not amenable to drainage, receiving IV antibiotics  RD received phone call from pt's cousin, Langley Gauss, who lives in [place] but is a Microbiologist and was concerned about pt's diet and nutritional status.   Pt reports poor appetite over the past month, however has been making herself eat. Pt reports still eating 3 meals per day. Pt eats yogurt with blueberries and high fiber cereal sprinkled on top. Since learning she is "borderline diabetic," pt has been eating more vegetables with protein at lunch and dinner. Denies taking oral nutrition supplements at home; pt has been taking a MVI daily  Pt has been NPO/CL/FL x 5 days. Noted no plan to increase past FL at this time, pending results of repeat CT abdomen. PO intake 25-50% of FL tray, no supplements have been ordered. This meets criteria for po </= 50% for >/= 5 days.   Pt tolerating FL diet at this time; however, pt concerned that the food she is eating is too high in sugar, making her CBGs elevated. Discussed food  preferences. Also discussed addition of Glucerna shakes with meals; pt in agreement with this plan.   Pt reports 5 pound weight loss in past 1 month; 5.1% weight loss in 1 month which is significant for time frame. Appears weight has been relatively stable for several years prior to this; around 98-99 pounds.    Labs: BUN <5, Creatinine wdl, CBGs 103-255 Meds: MVI, florastor, B-12, Vitamin C, psyllium  NUTRITION - FOCUSED PHYSICAL EXAM:    Most Recent Value  Orbital Region  No depletion  Upper Arm Region  Mild depletion  Thoracic and Lumbar Region  Mild depletion  Buccal Region  No depletion  Temple Region  No depletion  Clavicle Bone Region  Mild depletion  Clavicle and Acromion Bone Region  Mild depletion  Scapular Bone Region  Mild depletion  Dorsal Hand  Mild depletion  Patellar Region  Unable to assess  Anterior Thigh Region  Unable to assess  Posterior Calf Region  Unable to assess       Diet Order:   Diet Order           Diet full liquid Room service appropriate? Yes; Fluid consistency: Thin  Diet effective now          EDUCATION NEEDS:   No education needs have been identified at this time  Skin:  Skin Assessment: Reviewed RN Assessment  Last BM:  7/27  Height:   Ht Readings from Last 1 Encounters:  12/19/17 4\' 10"  (1.473 m)    Weight:  Wt Readings from Last 1 Encounters:  12/19/17 93 lb 0.6 oz (42.2 kg)    BMI:  Body mass index is 19.44 kg/m.  Estimated Nutritional Needs:   Kcal:  1300-1500 kcals  Protein:  60-70 g  Fluid:  >/= 1.3 L   Kerman Passey MS, RD, LDN, CNSC [phone] Pager  [phone] Weekend/On-Call Pager

## 2017-12-24 NOTE — Progress Notes (Signed)
[facility]/Trauma Progress Note      Assessment/Plan Principal Problem:   Diverticulitis of large intestine with abscess Active Problems:   Hypercholesterolemia   Sepsis ([place])   HTN (hypertension)   History of prediabetes   GERD (gastroesophageal reflux disease)   Anxiety   Acute renal failure superimposed on stage 2 chronic kidney disease (HCC)   Type II diabetes mellitus with renal manifestations ([place])   Pancreatic cyst  Diverticulitis with abscess - IR unable to drain - WBC and pain improving  FEN: FLD VTE: SCD's, lovenox okay from our standpoint ID: Zosyn 07/24>>   WBC 12.5 07/29 Foley: none Follow up: TBD  DISPO: Pt is overall improving. Pt may need a repeat CT scan to evaluate the abscess. Will discuss with MD.     LOS: 5 days    Subjective: CC: diverticulitis  Pt states she has not needed pain medication for her pain. Overall she states her pain has improved. She feels she is less tender on exam than she was yesterday. No fever, chills, nausea or vomiting. No family at bedside.   Objective: Vital signs in last 24 hours: Temp:  [97.9 F (36.6 C)-98.2 F (36.8 C)] 98.2 F (36.8 C) (07/29 1340) Pulse Rate:  [93-107] 101 (07/29 1340) Resp:  [16] 16 (07/29 0446) BP: (124-148)/(53-84) 148/80 (07/29 1340) SpO2:  [97 %-100 %] 100 % (07/29 1340) Last BM Date: 12/22/17  Intake/Output from previous day: 07/28 0701 - 07/29 0700 In: 1540 [P.O.:1540] Out: -  Intake/Output this shift: No intake/output data recorded.  PE: Gen:  Alert, NAD, pleasant, cooperative Pulm:  Rate and effort normal Abd: Soft, mild distention, +BS, mild TTP of LLQ and suprapubic regions, no guarding, no signs of peritonitis Skin: no rashes noted, warm and dry   Anti-infectives: Anti-infectives (From admission, onward)   Start     Dose/Rate Route Frequency Ordered Stop   12/20/17 0600  piperacillin-tazobactam (ZOSYN) IVPB 3.375 g     3.375 g 12.5 mL/hr over 240  Minutes Intravenous Every 12 hours 12/19/17 2032     12/19/17 1800  piperacillin-tazobactam (ZOSYN) IVPB 3.375 g     3.375 g 100 mL/hr over 30 Minutes Intravenous  Once 12/19/17 1748 12/19/17 1827      Lab Results:  Recent Labs    12/23/17 0458 12/24/17 0654  WBC 14.5* 12.5*  HGB 11.1* 13.0  HCT 34.4* 40.2  PLT 308 427*   BMET Recent Labs    12/23/17 0458  NA 140  K 3.7  CL 108  CO2 27  GLUCOSE 98  BUN <5*  CREATININE 1.00  CALCIUM 8.3*   PT/INR No results for input(s): LABPROT, INR in the last 72 hours. CMP     Component Value Date/Time   NA 140 12/23/2017 0458   NA 141 10/17/2016 0945   K 3.7 12/23/2017 0458   CL 108 12/23/2017 0458   CO2 27 12/23/2017 0458   GLUCOSE 98 12/23/2017 0458   BUN <5 (L) 12/23/2017 0458   BUN 14 10/17/2016 0945   CREATININE 1.00 12/23/2017 0458   CREATININE 0.88 04/10/2016 0953   CALCIUM 8.3 (L) 12/23/2017 0458   PROT 5.8 (L) 12/19/2017 2053   PROT 6.4 10/17/2016 0945   ALBUMIN 3.0 (L) 12/19/2017 2053   ALBUMIN 4.1 10/17/2016 0945   AST 19 12/19/2017 2053   ALT 18 12/19/2017 2053   ALKPHOS 55 12/19/2017 2053   BILITOT 0.3 12/19/2017 2053   BILITOT 0.3 10/17/2016 0945   GFRNONAA 49 (L) 12/23/2017  0458   GFRAA 57 (L) 12/23/2017 0458   Lipase     Component Value Date/Time   LIPASE 39 12/19/2017 1558    Studies/Results: No results found.    Kalman Drape , [facility] Surgery 12/24/2017, 1:55 PM  Pager: [phone] Mon-Wed, Friday 7:00am-4:30pm Thurs 7am-11:30am  Consults: [phone]

## 2017-12-24 NOTE — Plan of Care (Signed)
  Problem: Activity: Goal: Risk for activity intolerance will decrease Outcome: Progressing   Problem: Nutrition: Goal: Adequate nutrition will be maintained Outcome: Progressing   

## 2017-12-24 NOTE — Progress Notes (Signed)
PROGRESS NOTE  [name] ZOX:[id number] DOB: 02/18/1929 DOA: 12/19/2017 PCP: Crist Infante, MD   LOS: 5 days   Brief Narrative / Interim history: 84 year old female, quite independent, living alone, driving and able to do all her ADLs, with history of hypertension, hyperlipidemia, diabetes, history of breast cancer who presents to the hospital with abdominal pain.  Patient has been having abdominal pain for the past 2 to 3 weeks, mainly in the left lower quadrant.  She was evaluated in outpatient and eventually started on ciprofloxacin and metronidazole, in the ED she underwent a CT scan and she took that for a few days.  On follow-up visit she had blood work which showed a leukocytosis of 15, she is still having pain and was directed to the emergency room for further evaluation.  Of the abdomen and pelvis which showed the presence of diverticulitis as well as a diverticular abscess.  General surgery was consulted and patient was admitted to the hospital.  Assessment & Plan: Principal Problem:   Diverticulitis of large intestine with abscess Active Problems:   Hypercholesterolemia   Sepsis ([place])   HTN (hypertension)   History of prediabetes   GERD (gastroesophageal reflux disease)   Anxiety   Acute renal failure superimposed on stage 2 chronic kidney disease (HCC)   Type II diabetes mellitus with renal manifestations ([place])   Pancreatic cyst   Diverticulitis of large intestine with abscess -I do not think she was septic on admission, her tachycardia and tachypnea were not sustained.  Only thing she had was leukocytosis.  Currently she is hemodynamically stable, she was started on IV Zosyn which will be continued -CT scan on admission showed an abscess, per IR there is no safe window to drain and recommended antibiotics alone -General surgery following, will potentially repeat CT scan of the abdomen and pelvis is a day or tomorrow per surgery -White count improving and is down to 12.  She  has less abdominal pain. -Add Florastor given loose stools  Hyperlipidemia -Continue home medications  Hypertension -Blood pressure within normal parameters, keep on same regimen  Anxiety -PRN Xanax  Chronic kidney disease stage II -Creatinine stable yesterday, will repeat tomorrow  History of prediabetes -Fasting CBG 119, keep on sliding scale but does not need much insulin  Pancreatic cyst -Incidental finding, will need outpatient follow-up per radiology recommendations  DVT prophylaxis: SCDs Code Status: Full code Family Communication: No family present at bedside Disposition Plan: Home when ready   Consultants:   General surgery  IR  Procedures:   None   Antimicrobials:  Zosyn 7/24 >>   Subjective: -Feels better, less abdominal pain, no nausea or vomiting.  Complains of loose stools but no frank diarrhea  Objective: Vitals:   12/23/17 0433 12/23/17 1405 12/23/17 1946 12/24/17 0446  BP: 137/85 (!) 131/53 124/68 140/84  Pulse: 97 98 93 (!) 107  Resp: 16 16 16 16   Temp: 98.2 F (36.8 C) 97.9 F (36.6 C) 98.1 F (36.7 C) 98.1 F (36.7 C)  TempSrc: Oral Oral Oral Oral  SpO2: 98% 98% 97% 97%  Weight:      Height:        Intake/Output Summary (Last 24 hours) at 12/24/2017 1042 Last data filed at 12/24/2017 0700 Gross per 24 hour  Intake 1240 ml  Output -  Net 1240 ml   Filed Weights   12/19/17 1744 12/19/17 2144  Weight: 42.2 kg (93 lb) 42.2 kg (93 lb 0.6 oz)    Examination:  Constitutional:  NAD Eyes: No scleral icterus Respiratory: Clear to auscultation bilaterally without wheezing or crackles Cardiovascular: Regular rate and rhythm, no murmurs heard.  No peripheral edema Abdomen: Soft, nontender, nondistended.  No rebound or guarding.  Bowel sounds positive Skin: No rashes seen Neurologic: Nonfocal, equal strength  Data Reviewed: I have independently reviewed following labs and imaging studies   CBC: Recent Labs  Lab 12/19/17 1558  12/21/17 0457 12/22/17 0524 12/23/17 0458 12/24/17 0654  WBC 15.9* 17.3* 17.0* 14.5* 12.5*  HGB 12.1 10.3* 10.3* 11.1* 13.0  HCT 39.0 32.9* 32.6* 34.4* 40.2  MCV 90.5 92.2 91.1 90.3 89.7  PLT 427* 315 306 308 778*   Basic Metabolic Panel: Recent Labs  Lab 12/19/17 1558 12/19/17 2053 12/21/17 0457 12/23/17 0458  NA 132* 139 140 140  K 4.8 4.0 3.7 3.7  CL 98 107 109 108  CO2 22 23 23 27   GLUCOSE 390* 118* 86 98  BUN 12 10 <5* <5*  CREATININE 1.22* 1.05* 1.03* 1.00  CALCIUM 9.0 8.6* 7.8* 8.3*   GFR: Estimated Creatinine Clearance: 25.6 mL/min (by C-G formula based on SCr of 1 mg/dL). Liver Function Tests: Recent Labs  Lab 12/19/17 1558 12/19/17 2053  AST 25 19  ALT 21 18  ALKPHOS 61 55  BILITOT 0.4 0.3  PROT 6.6 5.8*  ALBUMIN 3.2* 3.0*   Recent Labs  Lab 12/19/17 1558  LIPASE 39   No results for input(s): AMMONIA in the last 168 hours. Coagulation Profile: Recent Labs  Lab 12/19/17 2053  INR 1.19   Cardiac Enzymes: No results for input(s): CKTOTAL, CKMB, CKMBINDEX, TROPONINI in the last 168 hours. BNP (last 3 results) No results for input(s): PROBNP in the last 8760 hours. HbA1C: No results for input(s): HGBA1C in the last 72 hours. CBG: Recent Labs  Lab 12/23/17 0742 12/23/17 1203 12/23/17 1658 12/23/17 1948 12/24/17 0815  GLUCAP 103* 255* 153* 197* 119*   Lipid Profile: No results for input(s): CHOL, HDL, LDLCALC, TRIG, CHOLHDL, LDLDIRECT in the last 72 hours. Thyroid Function Tests: No results for input(s): TSH, T4TOTAL, FREET4, T3FREE, THYROIDAB in the last 72 hours. Anemia Panel: No results for input(s): VITAMINB12, FOLATE, FERRITIN, TIBC, IRON, RETICCTPCT in the last 72 hours. Urine analysis:    Component Value Date/Time   COLORURINE STRAW (A) 12/19/2017 1854   APPEARANCEUR CLEAR 12/19/2017 1854   LABSPEC 1.013 12/19/2017 1854   PHURINE 8.0 12/19/2017 1854   GLUCOSEU 50 (A) 12/19/2017 1854   HGBUR NEGATIVE 12/19/2017 [place] NEGATIVE 12/19/2017 [place] 12/19/2017 1854   PROTEINUR NEGATIVE 12/19/2017 1854   NITRITE NEGATIVE 12/19/2017 1854   LEUKOCYTESUR SMALL (A) 12/19/2017 1854   Sepsis Labs: Invalid input(s): PROCALCITONIN, LACTICIDVEN  Recent Results (from the past 240 hour(s))  Culture, blood (x 2)     Status: None (Preliminary result)   Collection Time: 12/19/17  9:20 PM  Result Value Ref Range Status   Specimen Description BLOOD RIGHT WRIST  Final   Special Requests   Final    BOTTLES DRAWN AEROBIC AND ANAEROBIC Blood Culture adequate volume   Culture   Final    NO GROWTH 4 DAYS Performed at [facility], [address], [place], [place] [address]    Report Status PENDING  Incomplete  Culture, blood (x 2)     Status: None (Preliminary result)   Collection Time: 12/19/17 11:33 PM  Result Value Ref Range Status   Specimen Description BLOOD RIGHT WRIST  Final  Special Requests   Final    BOTTLES DRAWN AEROBIC AND ANAEROBIC Blood Culture adequate volume   Culture   Final    NO GROWTH 3 DAYS Performed at [facility], [place] [address], [place], [place] [address]    Report Status PENDING  Incomplete      Radiology Studies: No results found.   Scheduled Meds: . amLODipine  5 mg Oral Daily  . atorvastatin  20 mg Oral Daily  . cholecalciferol  1,000 Units Oral Daily  . ezetimibe  10 mg Oral Daily  . insulin aspart  0-9 Units Subcutaneous TID WC  . metoprolol succinate  12.5 mg Oral Daily  . multivitamin with minerals  1 tablet Oral Daily  . pantoprazole  40 mg Oral Daily  . psyllium  1 packet Oral QHS  . raloxifene  60 mg Oral Daily  . saccharomyces boulardii  250 mg Oral BID  . vitamin B-12  500 mcg Oral Daily  . vitamin C  500 mg Oral Daily   Continuous Infusions: . piperacillin-tazobactam (ZOSYN)  IV 3.375 g (12/24/17 0700)     Marzetta Board, MD, PhD [facility] Pager [phone]  If 7PM-7AM, please contact  night-coverage [email or web address] Password [facility] 12/24/2017, 10:42 AM

## 2017-12-25 ENCOUNTER — Inpatient Hospital Stay (HOSPITAL_COMMUNITY): Payer: Medicare Other

## 2017-12-25 DIAGNOSIS — E440 Moderate protein-calorie malnutrition: Secondary | ICD-10-CM

## 2017-12-25 LAB — CBC
HEMATOCRIT: 38.2 % (ref 36.0–46.0)
HEMOGLOBIN: 12.2 g/dL (ref 12.0–15.0)
MCH: 28.2 pg (ref 26.0–34.0)
MCHC: 31.9 g/dL (ref 30.0–36.0)
MCV: 88.2 fL (ref 78.0–100.0)
Platelets: 389 10*3/uL (ref 150–400)
RBC: 4.33 MIL/uL (ref 3.87–5.11)
RDW: 13 % (ref 11.5–15.5)
WBC: 13.1 10*3/uL — ABNORMAL HIGH (ref 4.0–10.5)

## 2017-12-25 LAB — GLUCOSE, CAPILLARY
GLUCOSE-CAPILLARY: 124 mg/dL — AB (ref 70–99)
GLUCOSE-CAPILLARY: 104 mg/dL — AB (ref 70–99)
GLUCOSE-CAPILLARY: 167 mg/dL — AB (ref 70–99)
GLUCOSE-CAPILLARY: 97 mg/dL (ref 70–99)

## 2017-12-25 LAB — CULTURE, BLOOD (ROUTINE X 2)
CULTURE: NO GROWTH
SPECIAL REQUESTS: ADEQUATE

## 2017-12-25 LAB — BASIC METABOLIC PANEL
ANION GAP: 8 (ref 5–15)
BUN: 5 mg/dL — AB (ref 8–23)
CO2: 26 mmol/L (ref 22–32)
Calcium: 9.1 mg/dL (ref 8.9–10.3)
Chloride: 105 mmol/L (ref 98–111)
Creatinine, Ser: 0.88 mg/dL (ref 0.44–1.00)
GFR calc non Af Amer: 57 mL/min — ABNORMAL LOW (ref >60)
GLUCOSE: 119 mg/dL — AB (ref 70–99)
Potassium: 3.8 mmol/L (ref 3.5–5.1)
Sodium: 139 mmol/L (ref 135–145)

## 2017-12-25 MED ORDER — IOPAMIDOL (ISOVUE-300) INJECTION 61%
INTRAVENOUS | Status: AC
  Administered 2017-12-25: 09:00:00
  Filled 2017-12-25: qty 30

## 2017-12-25 MED ORDER — IOHEXOL 300 MG/ML  SOLN
75.0000 mL | Freq: Once | INTRAMUSCULAR | Status: AC | PRN
  Administered 2017-12-25: 75 mL via INTRAVENOUS

## 2017-12-25 MED ORDER — ENSURE ENLIVE PO LIQD: 237.0000 mL | Freq: Two times a day (BID) | ORAL | Status: DC

## 2017-12-25 NOTE — Care Management Important Message (Signed)
Important Message  Patient Details  Name: [name] MRN: [id number] Date of Birth: 05-18-1929   Medicare Important Message Given:  Yes    Loann Quill 12/25/2017, 11:37 AM

## 2017-12-25 NOTE — Progress Notes (Signed)
PROGRESS NOTE  BINDU DOCTER TUU:[id number] DOB: 29-Oct-1928 DOA: 12/19/2017 PCP: Crist Infante, MD   LOS: 6 days   Brief Narrative / Interim history: 84 year old female, quite independent, living alone, driving and able to do all her ADLs, with history of hypertension, hyperlipidemia, diabetes, history of breast cancer who presents to the hospital with abdominal pain.  Patient has been having abdominal pain for the past 2 to 3 weeks, mainly in the left lower quadrant.  She was evaluated in outpatient and eventually started on ciprofloxacin and metronidazole, and on follow-up visit she had blood work which showed a leukocytosis of 15, she was still having pain and was directed to the emergency room for further evaluation. In the ED she underwent a CT scan of the abdomen and pelvis which showed the presence of diverticulitis as well as a diverticular abscessand she took that for a few days. General surgery was consulted and patient was admitted to the hospital.  IR could not aspirate the abscess initially, she was treated conservatively with IV antibiotics and a repeat CT scan of the abdomen and pelvis is pending on 7/30  Assessment & Plan: Principal Problem:   Diverticulitis of large intestine with abscess Active Problems:   Hypercholesterolemia   Sepsis ([place])   HTN (hypertension)   History of prediabetes   GERD (gastroesophageal reflux disease)   Anxiety   Acute renal failure superimposed on stage 2 chronic kidney disease (HCC)   Type II diabetes mellitus with renal manifestations ([place])   Pancreatic cyst   Malnutrition of moderate degree   Diverticulitis of large intestine with abscess -I do not think she was septic on admission, her tachycardia and tachypnea were not sustained.  Only thing she had was leukocytosis.  Currently she is hemodynamically stable, she was started on IV Zosyn which will be continued -CT scan on admission showed an abscess, per IR there is no safe window to drain and  recommended antibiotics alone -General surgery following, repeat CT scan today -White count overall improved and 13 this morning -Add Florastor given loose stools  Hyperlipidemia -Continue home medications  Hypertension -Blood pressure within normal parameters, keep on same regimen  Anxiety -PRN Xanax  Chronic kidney disease stage II -Creatinine stable this morning  History of prediabetes -CBGs well-controlled  Pancreatic cyst -Incidental finding, will need outpatient follow-up per radiology recommendations  DVT prophylaxis: SCDs Code Status: Full code Family Communication: No family present at bedside Disposition Plan: Home when ready   Consultants:   General surgery  IR  Procedures:   None   Antimicrobials:  Zosyn 7/24 >>   Subjective: -Feels "about the same".  Has intermittent mild abdominal pain that comes and goes.  No nausea or vomiting fever or chills overnight  Objective: Vitals:   12/24/17 0446 12/24/17 1340 12/24/17 2212 12/25/17 0601  BP: 140/84 (!) 148/80 130/84 140/72  Pulse: (!) 107 (!) 101 92 95  Resp: 16  16 16   Temp: 98.1 F (36.7 C) 98.2 F (36.8 C) 98.3 F (36.8 C) 98.2 F (36.8 C)  TempSrc: Oral Oral Oral Oral  SpO2: 97% 100% 98% 97%  Weight:      Height:        Intake/Output Summary (Last 24 hours) at 12/25/2017 1040 Last data filed at 12/25/2017 0947 Gross per 24 hour  Intake 500 ml  Output -  Net 500 ml   Filed Weights   12/19/17 1744 12/19/17 2144  Weight: 42.2 kg (93 lb) 42.2 kg (93 lb 0.6 oz)  Examination:  Constitutional: NAD Respiratory: CTA Cardiovascular: RRR Abdomen: Soft, nontender, nondistended.  No rebound or guarding.  Bowel sounds positive  Data Reviewed: I have independently reviewed following labs and imaging studies   CBC: Recent Labs  Lab 12/21/17 0457 12/22/17 0524 12/23/17 0458 12/24/17 0654 12/25/17 0634  WBC 17.3* 17.0* 14.5* 12.5* 13.1*  HGB 10.3* 10.3* 11.1* 13.0 12.2  HCT 32.9*  32.6* 34.4* 40.2 38.2  MCV 92.2 91.1 90.3 89.7 88.2  PLT 315 306 308 427* 323   Basic Metabolic Panel: Recent Labs  Lab 12/19/17 1558 12/19/17 2053 12/21/17 0457 12/23/17 0458 12/25/17 0634  NA 132* 139 140 140 139  K 4.8 4.0 3.7 3.7 3.8  CL 98 107 109 108 105  CO2 22 23 23 27 26   GLUCOSE 390* 118* 86 98 119*  BUN 12 10 <5* <5* 5*  CREATININE 1.22* 1.05* 1.03* 1.00 0.88  CALCIUM 9.0 8.6* 7.8* 8.3* 9.1   GFR: Estimated Creatinine Clearance: 29.1 mL/min (by C-G formula based on SCr of 0.88 mg/dL). Liver Function Tests: Recent Labs  Lab 12/19/17 1558 12/19/17 2053  AST 25 19  ALT 21 18  ALKPHOS 61 55  BILITOT 0.4 0.3  PROT 6.6 5.8*  ALBUMIN 3.2* 3.0*   Recent Labs  Lab 12/19/17 1558  LIPASE 39   No results for input(s): AMMONIA in the last 168 hours. Coagulation Profile: Recent Labs  Lab 12/19/17 2053  INR 1.19   Cardiac Enzymes: No results for input(s): CKTOTAL, CKMB, CKMBINDEX, TROPONINI in the last 168 hours. BNP (last 3 results) No results for input(s): PROBNP in the last 8760 hours. HbA1C: No results for input(s): HGBA1C in the last 72 hours. CBG: Recent Labs  Lab 12/24/17 0815 12/24/17 1146 12/24/17 1724 12/24/17 2207 12/25/17 0807  GLUCAP 119* 140* 145* 166* 124*   Lipid Profile: No results for input(s): CHOL, HDL, LDLCALC, TRIG, CHOLHDL, LDLDIRECT in the last 72 hours. Thyroid Function Tests: No results for input(s): TSH, T4TOTAL, FREET4, T3FREE, THYROIDAB in the last 72 hours. Anemia Panel: No results for input(s): VITAMINB12, FOLATE, FERRITIN, TIBC, IRON, RETICCTPCT in the last 72 hours. Urine analysis:    Component Value Date/Time   COLORURINE STRAW (A) 12/19/2017 1854   APPEARANCEUR CLEAR 12/19/2017 1854   LABSPEC 1.013 12/19/2017 1854   PHURINE 8.0 12/19/2017 1854   GLUCOSEU 50 (A) 12/19/2017 1854   HGBUR NEGATIVE 12/19/2017 [place] NEGATIVE 12/19/2017 [place] 12/19/2017 1854   PROTEINUR NEGATIVE  12/19/2017 1854   NITRITE NEGATIVE 12/19/2017 1854   LEUKOCYTESUR SMALL (A) 12/19/2017 1854   Sepsis Labs: Invalid input(s): PROCALCITONIN, LACTICIDVEN  Recent Results (from the past 240 hour(s))  Culture, blood (x 2)     Status: None   Collection Time: 12/19/17  9:20 PM  Result Value Ref Range Status   Specimen Description BLOOD RIGHT WRIST  Final   Special Requests   Final    BOTTLES DRAWN AEROBIC AND ANAEROBIC Blood Culture adequate volume   Culture   Final    NO GROWTH 5 DAYS Performed at [facility], [address], [place], [place] [address]    Report Status 12/24/2017 FINAL  Final  Culture, blood (x 2)     Status: None (Preliminary result)   Collection Time: 12/19/17 11:33 PM  Result Value Ref Range Status   Specimen Description BLOOD RIGHT WRIST  Final   Special Requests   Final    BOTTLES DRAWN AEROBIC AND ANAEROBIC Blood Culture adequate volume  Culture   Final    NO GROWTH 4 DAYS Performed at [facility], [place] [address], [place], [place] [address]    Report Status PENDING  Incomplete      Radiology Studies: No results found.   Scheduled Meds: . amLODipine  5 mg Oral Daily  . atorvastatin  20 mg Oral Daily  . cholecalciferol  1,000 Units Oral Daily  . ezetimibe  10 mg Oral Daily  . feeding supplement (GLUCERNA SHAKE)  237 mL Oral TID WC  . insulin aspart  0-9 Units Subcutaneous TID WC  . metoprolol succinate  12.5 mg Oral Daily  . multivitamin with minerals  1 tablet Oral Daily  . pantoprazole  40 mg Oral Daily  . psyllium  1 packet Oral QHS  . raloxifene  60 mg Oral Daily  . saccharomyces boulardii  250 mg Oral BID  . vitamin B-12  500 mcg Oral Daily  . vitamin C  500 mg Oral Daily   Continuous Infusions: . piperacillin-tazobactam (ZOSYN)  IV 3.375 g (12/25/17 0141)     Marzetta Board, MD, PhD [facility] Pager [phone]  If 7PM-7AM, please contact night-coverage [email or web address] Password [email or web address] 12/25/2017, 10:40 AM

## 2017-12-25 NOTE — Consult Note (Signed)
Chief Complaint: Patient was seen in consultation today for intra abdominal abscess drain placement Chief Complaint  Patient presents with  . Abdominal Pain   at the request of Dr Rockne Coons  Supervising Physician: Marybelle Killings  Patient Status: [facility] - In-pt  History of Present Illness: [name] is a 84 y.o. female   Hx Breast Ca; HTN; HLD; DM Abdominal pain x 2 weeks; LLQ Leukocytosis  Diverticular abscess Initial evaluation from IR showed no real window for aspiration New CT today after antibiotics: IMPRESSION: 1. Abscess associated with sigmoid diverticulitis has decreased slightly in overall dimension since the prior examination currently 3 x 3.7 x 4.2 cm versus prior 3.8 x 4.1 x 4.2 cm. There has been a decrease in the amount of fluid contain within the abscess and slight increase in amount of gas. 2. Remainder of findings without significant change as detailed above.  CCS asking if "window" has improved and if this collections is amenable to aspiration/drain  Dr Barbie Banner has reviewed imaging and approves drain placement Scheduled in IR for tomorrow   Past Medical History:  Diagnosis Date  . Breast cancer ([place])   . Diverticulosis   . Hiatal hernia   . Hypercholesteremia   . Hypertension   . Osteopenia   . Osteoporosis     Past Surgical History:  Procedure Laterality Date  . ABDOMINAL HYSTERECTOMY    . MASTECTOMY  1999   LEFT    Allergies: Patient has no known allergies.  Medications: Prior to Admission medications   Medication Sig Start Date End Date Taking? Authorizing Provider  ALPRAZolam Duanne Moron) 0.25 MG tablet Take 1/2 tablet by mouth at bedtime as needed for anxiety (sleep)   Yes [provider]  Ascorbic Acid (VITAMIN C) 500 MG tablet Take 500 mg by mouth daily.     Yes [provider]  aspirin EC 81 MG tablet Take 1 tablet (81 mg total) by mouth daily. 10/18/16  Yes Weaver, Scott T, PA-C  atorvastatin (LIPITOR) 20 MG tablet  Take 20 mg by mouth daily. 03/02/16  Yes [provider]  Cholecalciferol (VITAMIN D) 1000 UNITS capsule Take 1,000 Units by mouth daily.     Yes [provider]  Coenzyme Q10 (CO Q-10) 200 MG CAPS Take 1 capsule by mouth daily.    Yes [provider]  ezetimibe (ZETIA) 10 MG tablet Take 10 mg by mouth daily. 02/26/16  Yes [provider]  Ibuprofen-Diphenhydramine Cit (ADVIL PM PO) Take 1 tablet by mouth at bedtime.    Yes [provider]  losartan (COZAAR) 25 MG tablet Take 1 tablet (25 mg total) by mouth daily. 04/30/17  Yes Nahser, Wonda Cheng, MD  metoprolol succinate (TOPROL-XL) 25 MG 24 hr tablet TAKE 1/2 TABLET BY MOUTH DAILY 10/01/17  Yes Nahser, Wonda Cheng, MD  metroNIDAZOLE (FLAGYL) 500 MG tablet Take 500 mg by mouth 3 (three) times daily. 12/17/17 12/27/17 Yes [provider]  Multiple Vitamin (MULTIVITAMIN) tablet Take 1 tablet by mouth daily.     Yes [provider]  omeprazole (PRILOSEC) 20 MG capsule Take 1 capsule (20 mg total) by mouth daily. 05/27/15  Yes Darlin Coco, MD  Psyllium (METAMUCIL PO) Take 1 Dose by mouth at bedtime. Mix one teaspoon with 8oz of water.   Yes [provider]  raloxifene (EVISTA) 60 MG tablet TAKE 1 TABLET BY MOUTH ONCE DAILY Patient taking differently: TAKE 60MG  BY MOUTH ONCE DAILY 12/23/14  Yes Darlin Coco, MD  vitamin B-12 (  CYANOCOBALAMIN) 500 MCG tablet Take 500 mcg by mouth daily.    Yes [provider]     Family History  Problem Relation Age of Onset  . Arthritis Father   . Stroke Father   . Hypertension Mother     Social History   Socioeconomic History  . Marital status: Single    Spouse name: Not on file  . Number of children: Not on file  . Years of education: Not on file  . Highest education level: Not on file  Occupational History  . Not on file  Social Needs  . Financial resource strain: Not on file  . Food insecurity:    Worry: Not on file     Inability: Not on file  . Transportation needs:    Medical: Not on file    Non-medical: Not on file  Tobacco Use  . Smoking status: Never Smoker  . Smokeless tobacco: Never Used  Substance and Sexual Activity  . Alcohol use: Not on file  . Drug use: Not on file  . Sexual activity: Not on file  Lifestyle  . Physical activity:    Days per week: Not on file    Minutes per session: Not on file  . Stress: Not on file  Relationships  . Social connections:    Talks on phone: Not on file    Gets together: Not on file    Attends religious service: Not on file    Active member of club or organization: Not on file    Attends meetings of clubs or organizations: Not on file    Relationship status: Not on file  Other Topics Concern  . Not on file  Social History Narrative  . Not on file    Review of Systems: A 12 point ROS discussed and pertinent positives are indicated in the HPI above.  All other systems are negative.  Review of Systems  Constitutional: Positive for activity change.  Respiratory: Negative for cough and shortness of breath.   Cardiovascular: Negative for chest pain.  Gastrointestinal: Positive for abdominal pain and nausea.  Psychiatric/Behavioral: Negative for behavioral problems and confusion.    Vital Signs: BP (!) 150/72 (BP Location: Right Arm)   Pulse (!) 105   Temp 98.2 F (36.8 C) (Oral)   Resp 16   Ht 4\' 10"  (1.473 m)   Wt 93 lb 0.6 oz (42.2 kg)   SpO2 98%   BMI 19.44 kg/m   Physical Exam  Constitutional: She is oriented to person, place, and time.  Cardiovascular: Normal rate and regular rhythm.  Pulmonary/Chest: Effort normal and breath sounds normal.  Abdominal: Normal appearance and bowel sounds are normal. There is tenderness.  Neurological: She is alert and oriented to person, place, and time.  Skin: Skin is warm and dry.  Psychiatric: She has a normal mood and affect. Her behavior is normal.  Nursing note and vitals  reviewed.   Imaging: Ct Abdomen Pelvis W Contrast  Result Date: 12/25/2017 CLINICAL DATA:  84 year old female with diverticulitis. Status post antibiotics. Subsequent encounter. EXAM: CT ABDOMEN AND PELVIS WITH CONTRAST TECHNIQUE: Multidetector CT imaging of the abdomen and pelvis was performed using the standard protocol following bolus administration of intravenous contrast. CONTRAST:  58mL OMNIPAQUE IOHEXOL 300 MG/ML  SOLN COMPARISON:  12/19/2017 CT. FINDINGS: Lower chest: Prominent mitral valve calcification. Right coronary artery calcification. Heart size within normal limits. Breast prosthesis in place. No worrisome lung base abnormality. Hepatobiliary: No worrisome hepatic lesion. Prominent size gallbladder without  calcified gallstone. No surrounding inflammation. No common bile duct stone. Pancreas: Scattered tiny pancreatic calcifications. Questionable tiny cystic lesion with follow-up as previously noted. Spleen: No splenic mass or enlargement. Adrenals/Urinary Tract: No obstructing stone or hydronephrosis. No worrisome renal or adrenal lesion. Noncontrast filled imaging urinary bladder without gross abnormality. Stomach/Bowel: Abscess associated with sigmoid diverticulitis has decreased slightly in overall dimension since the prior examination currently 3 x 3.7 x 4.2 cm versus prior 3.8 x 4.1 x 4.2 cm. There has been a decrease in the amount of fluid contain within the abscess and slight increase in amount of gas. Prominent muscular hypertrophy/diverticula throughout the sigmoid colon which remains inflamed. Less notable diverticula throughout the descending colon. 2 cm lipoma within small bowel probably ilium. Moderate-size hiatal hernia. Vascular/Lymphatic: Atherosclerotic changes aorta and aortic branch vessels. No abdominal aortic aneurysm or large vessel occlusion. Small lymph nodes left pelvis probably reactive in origin. Reproductive: Post hysterectomy.  No worrisome adnexal mass. Other: No  free intraperitoneal air separate from abscess. Musculoskeletal: Scoliosis lumbar spine convex left with superimposed prominent degenerative changes L1-2 through L5-S1 similar to prior exam. IMPRESSION: 1. Abscess associated with sigmoid diverticulitis has decreased slightly in overall dimension since the prior examination currently 3 x 3.7 x 4.2 cm versus prior 3.8 x 4.1 x 4.2 cm. There has been a decrease in the amount of fluid contain within the abscess and slight increase in amount of gas. 2. Remainder of findings without significant change as detailed above. 3.  Aortic Atherosclerosis (ICD10-I70.0). Electronically Signed   By: Genia Del M.D.   On: 12/25/2017 13:08   Ct Abdomen Pelvis W Contrast  Result Date: 12/19/2017 CLINICAL DATA:  One month history of LEFT LOWER QUADRANT abdominal pain. Patient is being treated for diverticulitis, and presents with leukocytosis. Personal history of LEFT breast cancer post mastectomy in 1999. Surgical history also includes abdominal hysterectomy. Creatinine was obtained on site at [facility] at [address] Results: Creatinine 1.0 mg/dL.  BUN 18.  Estimated GFR 51. EXAM: CT ABDOMEN AND PELVIS WITH CONTRAST TECHNIQUE: Multidetector CT imaging of the abdomen and pelvis was performed using the standard protocol following bolus administration of intravenous contrast. CONTRAST:  30mL ISOVUE-370 IOPAMIDOL INJECTION 76% IV. Oral contrast was not administered. COMPARISON:  10/05/2003. FINDINGS: Lower chest: Mild bronchiectasis involving the RIGHT LOWER LOBE. Visualized lung bases clear. Heart size normal. Hepatobiliary: Liver normal in size and appearance. Gallbladder normal in appearance without calcified gallstones. No biliary ductal dilation. Pancreas: Scattered pancreatic calcifications. 8 mm cystic mass arising from the proximal body of the pancreas (series 2, image 18 and coronal image 32, best seen on the coronal image). No other pancreatic masses.  Spleen: Normal in size and appearance. Adrenals/Urinary Tract: Normal appearing adrenal glands. Kidneys normal in size and appearance without focal parenchymal abnormality. No evidence of urinary tract calculi or obstruction. Normal-appearing decompressed urinary bladder. Stomach/Bowel: Moderate to large hiatal hernia, not significantly changed since the 2005 examination. Stomach otherwise normal in appearance. Normal-appearing small bowel. Opaque ingested material in the terminal ileum and cecum. Extensive sigmoid colon diverticulosis and scattered diverticula involving the distal descending colon. Acute diverticulitis involving the proximal sigmoid colon. Approximate 4 cm gas containing fluid collection adjacent to the proximal sigmoid colon. No free intraperitoneal air. Normal appearing decompressed appendix in the RIGHT UPPER pelvis. Vascular/Lymphatic: Moderate aortoiliac atherosclerosis without evidence of aneurysm. Normal-appearing portal venous and systemic venous systems. No pathologic lymphadenopathy. Reproductive: Surgically absent uterus.  No adnexal masses. Other: None. Musculoskeletal: Severe degenerative  disc disease and spondylosis at L1-2, L2-3, L3-4 and L4-5. Diffuse facet degenerative changes throughout the lumbar spine. Thoracolumbar levoscoliosis. No acute findings. IMPRESSION: 1. Acute/subacute sigmoid diverticulitis with an associated approximate 4 cm abscess adjacent to the proximal sigmoid colon. 2. 8 mm cystic mass involving the proximal body of the pancreas. Recommend follow up pre and post contrast MRI/MRCP or pancreatic protocol CT in 2 years. This recommendation follows ACR consensus guidelines: Management of Incidental Pancreatic Cysts: A White Paper of the ACR Incidental Findings Committee. J Am Coll Radiol 6553;74:[id number]. 3. Moderate to large hiatal hernia. 4. RIGHT LOWER LOBE bronchiectasis. 5.  Aortic Atherosclerosis (ICD10-170.0) These results will be called to the ordering  clinician or representative by the Radiologist Assistant, and communication documented in the PACS or zVision Dashboard. Electronically Signed   By: Evangeline Dakin M.D.   On: 12/19/2017 12:28    Labs:  CBC: Recent Labs    12/22/17 0524 12/23/17 0458 12/24/17 0654 12/25/17 0634  WBC 17.0* 14.5* 12.5* 13.1*  HGB 10.3* 11.1* 13.0 12.2  HCT 32.6* 34.4* 40.2 38.2  PLT 306 308 427* 389    COAGS: Recent Labs    12/19/17 2053  INR 1.19  APTT 35    BMP: Recent Labs    12/19/17 2053 12/21/17 0457 12/23/17 0458 12/25/17 0634  NA 139 140 140 139  K 4.0 3.7 3.7 3.8  CL 107 109 108 105  CO2 23 23 27 26   GLUCOSE 118* 86 98 119*  BUN 10 <5* <5* 5*  CALCIUM 8.6* 7.8* 8.3* 9.1  CREATININE 1.05* 1.03* 1.00 0.88  GFRNONAA 47* 48* 49* 57*  GFRAA 54* 55* 57* >60    LIVER FUNCTION TESTS: Recent Labs    12/19/17 1558 12/19/17 2053  BILITOT 0.4 0.3  AST 25 19  ALT 21 18  ALKPHOS 61 55  PROT 6.6 5.8*  ALBUMIN 3.2* 3.0*    TUMOR MARKERS: No results for input(s): AFPTM, CEA, CA199, CHROMGRNA in the last 8760 hours.  Assessment and Plan:  Diverticular abscess Scheduled for abscess drain placement in IR 7/31 Risks and benefits discussed with the patient including bleeding, infection, damage to adjacent structures, bowel perforation/fistula connection, and sepsis.  All of the patient's questions were answered, patient is agreeable to proceed. Consent signed and in chart.   Thank you for this interesting consult.  I greatly enjoyed meeting [name] and look forward to participating in their care.  A copy of this report was sent to the requesting provider on this date.  Electronically Signed: Lavonia Drafts, PA-C 12/25/2017, 3:51 PM   I spent a total of 40 Minutes    in face to face in clinical consultation, greater than 50% of which was counseling/coordinating care for diverticular abscess drain placement

## 2017-12-25 NOTE — Progress Notes (Signed)
[facility]/Trauma Progress Note      Assessment/Plan Principal Problem:   Diverticulitis of large intestine with abscess Active Problems:   Hypercholesterolemia   Sepsis ([place])   HTN (hypertension)   History of prediabetes   GERD (gastroesophageal reflux disease)   Anxiety   Acute renal failure superimposed on stage 2 chronic kidney disease (HCC)   Type II diabetes mellitus with renal manifestations ([place])   Pancreatic cyst  Diverticulitis with abscess - IR unable to drain - pain improving  FEN: FLD VTE: SCD's, lovenox or heparin okay from our standpoint ID: Zosyn 07/24>>   WBC 13.1 07/30 Foley: none Follow up: TBD  DISPO: CT pending to evaluate abscess in setting of increasing WBC. Added Ensure    LOS: 6 days    Subjective: CC: diverticulitis  Pain improved. No fever, chills, nausea or vomiting. No family at bedside.   Objective: Vital signs in last 24 hours: Temp:  [98.2 F (36.8 C)-98.3 F (36.8 C)] 98.2 F (36.8 C) (07/30 0601) Pulse Rate:  [92-101] 95 (07/30 0601) Resp:  [16] 16 (07/30 0601) BP: (130-148)/(72-84) 140/72 (07/30 0601) SpO2:  [97 %-100 %] 97 % (07/30 0601) Last BM Date: 12/24/17  Intake/Output from previous day: No intake/output data recorded. Intake/Output this shift: Total I/O In: 500 [P.O.:500] Out: -   PE: Gen:  Alert, NAD, pleasant, cooperative Pulm:  Rate and effort normal Abd: Soft, mild distention, no TTP, no guarding, no signs of peritonitis Skin: no rashes noted, warm and dry  Anti-infectives: Anti-infectives (From admission, onward)   Start     Dose/Rate Route Frequency Ordered Stop   12/20/17 0600  piperacillin-tazobactam (ZOSYN) IVPB 3.375 g     3.375 g 12.5 mL/hr over 240 Minutes Intravenous Every 12 hours 12/19/17 2032     12/19/17 1800  piperacillin-tazobactam (ZOSYN) IVPB 3.375 g     3.375 g 100 mL/hr over 30 Minutes Intravenous  Once 12/19/17 1748 12/19/17 1827      Lab Results:  Recent  Labs    12/24/17 0654 12/25/17 0634  WBC 12.5* 13.1*  HGB 13.0 12.2  HCT 40.2 38.2  PLT 427* 389   BMET Recent Labs    12/23/17 0458 12/25/17 0634  NA 140 139  K 3.7 3.8  CL 108 105  CO2 27 26  GLUCOSE 98 119*  BUN <5* 5*  CREATININE 1.00 0.88  CALCIUM 8.3* 9.1   PT/INR No results for input(s): LABPROT, INR in the last 72 hours. CMP     Component Value Date/Time   NA 139 12/25/2017 0634   NA 141 10/17/2016 0945   K 3.8 12/25/2017 0634   CL 105 12/25/2017 0634   CO2 26 12/25/2017 0634   GLUCOSE 119 (H) 12/25/2017 0634   BUN 5 (L) 12/25/2017 0634   BUN 14 10/17/2016 0945   CREATININE 0.88 12/25/2017 0634   CREATININE 0.88 04/10/2016 0953   CALCIUM 9.1 12/25/2017 0634   PROT 5.8 (L) 12/19/2017 2053   PROT 6.4 10/17/2016 0945   ALBUMIN 3.0 (L) 12/19/2017 2053   ALBUMIN 4.1 10/17/2016 0945   AST 19 12/19/2017 2053   ALT 18 12/19/2017 2053   ALKPHOS 55 12/19/2017 2053   BILITOT 0.3 12/19/2017 2053   BILITOT 0.3 10/17/2016 0945   GFRNONAA 57 (L) 12/25/2017 0634   GFRAA >60 12/25/2017 0634   Lipase     Component Value Date/Time   LIPASE 39 12/19/2017 1558    Studies/Results: No results found.    Kalman Drape ,  [facility] Surgery 12/25/2017, 10:40 AM  Pager: [phone] Mon-Wed, Friday 7:00am-4:30pm Thurs 7am-11:30am  Consults: [phone]

## 2017-12-26 ENCOUNTER — Inpatient Hospital Stay (HOSPITAL_COMMUNITY): Payer: Medicare Other

## 2017-12-26 DIAGNOSIS — K5720 Diverticulitis of large intestine with perforation and abscess without bleeding: Principal | ICD-10-CM

## 2017-12-26 LAB — CBC
HEMATOCRIT: 38.6 % (ref 36.0–46.0)
Hemoglobin: 12.4 g/dL (ref 12.0–15.0)
MCH: 28.6 pg (ref 26.0–34.0)
MCHC: 32.1 g/dL (ref 30.0–36.0)
MCV: 89.1 fL (ref 78.0–100.0)
Platelets: 368 10*3/uL (ref 150–400)
RBC: 4.33 MIL/uL (ref 3.87–5.11)
RDW: 13.2 % (ref 11.5–15.5)
WBC: 10.5 10*3/uL (ref 4.0–10.5)

## 2017-12-26 LAB — GLUCOSE, CAPILLARY
Glucose-Capillary: 139 mg/dL — ABNORMAL HIGH (ref 70–99)
Glucose-Capillary: 102 mg/dL — ABNORMAL HIGH (ref 70–99)
Glucose-Capillary: 101 mg/dL — ABNORMAL HIGH (ref 70–99)
Glucose-Capillary: 171 mg/dL — ABNORMAL HIGH (ref 70–99)

## 2017-12-26 LAB — PROTIME-INR
INR: 1.07
PROTHROMBIN TIME: 13.8 s (ref 11.4–15.2)

## 2017-12-26 MED ORDER — LIDOCAINE HCL 1 % IJ SOLN
INTRAMUSCULAR | Status: AC
  Filled 2017-12-26: qty 20

## 2017-12-26 MED ORDER — SODIUM CHLORIDE 0.9% FLUSH
5.0000 mL | Freq: Three times a day (TID) | INTRAVENOUS | Status: DC
  Administered 2017-12-26: 5 mL
  Administered 2017-12-27: 10 mL
  Administered 2017-12-27 – 2017-12-28 (×3): 5 mL

## 2017-12-26 MED ORDER — FENTANYL CITRATE (PF) 100 MCG/2ML IJ SOLN
INTRAMUSCULAR | Status: AC
  Filled 2017-12-26: qty 2

## 2017-12-26 MED ORDER — MIDAZOLAM HCL 2 MG/2ML IJ SOLN
INTRAMUSCULAR | Status: AC
  Filled 2017-12-26: qty 2

## 2017-12-26 MED ORDER — FENTANYL CITRATE (PF) 100 MCG/2ML IJ SOLN
INTRAMUSCULAR | Status: AC | PRN
  Administered 2017-12-26: 50 ug via INTRAVENOUS

## 2017-12-26 MED ORDER — MIDAZOLAM HCL 2 MG/2ML IJ SOLN
INTRAMUSCULAR | Status: AC | PRN
  Administered 2017-12-26: 1 mg via INTRAVENOUS

## 2017-12-26 MED ORDER — TRAMADOL HCL 50 MG PO TABS
25.0000 mg | ORAL_TABLET | Freq: Three times a day (TID) | ORAL | Status: DC | PRN
  Administered 2017-12-26 – 2017-12-28 (×3): 25 mg via ORAL
  Filled 2017-12-26 (×3): qty 1

## 2017-12-26 NOTE — Sedation Documentation (Signed)
Patient is resting comfortably. 

## 2017-12-26 NOTE — Sedation Documentation (Signed)
o2 2L applied

## 2017-12-26 NOTE — Progress Notes (Signed)
[facility] Progress Note     Subjective: CC:  Denies pain, nausea, or vomiting. Having liquid BMs about twice daily. Tolerating small amounts full liquids but not eating much due to poor appetite. Nervous about IR drain placement.  Objective: Vital signs in last 24 hours: Temp:  [98.2 F (36.8 C)-98.5 F (36.9 C)] 98.5 F (36.9 C) (07/31 0518) Pulse Rate:  [90-105] 99 (07/31 0518) Resp:  [16-18] 18 (07/31 0518) BP: (143-151)/(72-75) 143/75 (07/31 0518) SpO2:  [96 %-98 %] 98 % (07/31 0518) Last BM Date: 12/25/17  Intake/Output from previous day: 07/30 0701 - 07/31 0700 In: 1680.6 [P.O.:1660; IV Piggyback:20.6] Out: -  Intake/Output this shift: No intake/output data recorded.  PE: Gen:  Alert, NAD, pleasant and cooperative Card:  Regular rate and rhythm, pedal pulses 2+ BL Pulm:  Normal effort, clear to auscultation bilaterally Abd: Soft, mild distention, mild TTP over suprapubic region, +BS, no peritonitis, previous laparotomy scar Skin: warm and dry, no rashes  Psych: A&Ox3   Lab Results:  Recent Labs    12/24/17 0654 12/25/17 0634  WBC 12.5* 13.1*  HGB 13.0 12.2  HCT 40.2 38.2  PLT 427* 389   BMET Recent Labs    12/25/17 0634  NA 139  K 3.8  CL 105  CO2 26  GLUCOSE 119*  BUN 5*  CREATININE 0.88  CALCIUM 9.1   PT/INR Recent Labs    12/26/17 0602  LABPROT 13.8  INR 1.07   CMP     Component Value Date/Time   NA 139 12/25/2017 0634   NA 141 10/17/2016 0945   K 3.8 12/25/2017 0634   CL 105 12/25/2017 0634   CO2 26 12/25/2017 0634   GLUCOSE 119 (H) 12/25/2017 0634   BUN 5 (L) 12/25/2017 0634   BUN 14 10/17/2016 0945   CREATININE 0.88 12/25/2017 0634   CREATININE 0.88 04/10/2016 0953   CALCIUM 9.1 12/25/2017 0634   PROT 5.8 (L) 12/19/2017 2053   PROT 6.4 10/17/2016 0945   ALBUMIN 3.0 (L) 12/19/2017 2053   ALBUMIN 4.1 10/17/2016 0945   AST 19 12/19/2017 2053   ALT 18 12/19/2017 2053   ALKPHOS 55 12/19/2017 2053   BILITOT 0.3  12/19/2017 2053   BILITOT 0.3 10/17/2016 0945   GFRNONAA 57 (L) 12/25/2017 0634   GFRAA >60 12/25/2017 0634   Lipase     Component Value Date/Time   LIPASE 39 12/19/2017 1558       Studies/Results: Ct Abdomen Pelvis W Contrast  Result Date: 12/25/2017 CLINICAL DATA:  84 year old female with diverticulitis. Status post antibiotics. Subsequent encounter. EXAM: CT ABDOMEN AND PELVIS WITH CONTRAST TECHNIQUE: Multidetector CT imaging of the abdomen and pelvis was performed using the standard protocol following bolus administration of intravenous contrast. CONTRAST:  27mL OMNIPAQUE IOHEXOL 300 MG/ML  SOLN COMPARISON:  12/19/2017 CT. FINDINGS: Lower chest: Prominent mitral valve calcification. Right coronary artery calcification. Heart size within normal limits. Breast prosthesis in place. No worrisome lung base abnormality. Hepatobiliary: No worrisome hepatic lesion. Prominent size gallbladder without calcified gallstone. No surrounding inflammation. No common bile duct stone. Pancreas: Scattered tiny pancreatic calcifications. Questionable tiny cystic lesion with follow-up as previously noted. Spleen: No splenic mass or enlargement. Adrenals/Urinary Tract: No obstructing stone or hydronephrosis. No worrisome renal or adrenal lesion. Noncontrast filled imaging urinary bladder without gross abnormality. Stomach/Bowel: Abscess associated with sigmoid diverticulitis has decreased slightly in overall dimension since the prior examination currently 3 x 3.7 x 4.2 cm versus prior 3.8 x 4.1 x 4.2 cm.  There has been a decrease in the amount of fluid contain within the abscess and slight increase in amount of gas. Prominent muscular hypertrophy/diverticula throughout the sigmoid colon which remains inflamed. Less notable diverticula throughout the descending colon. 2 cm lipoma within small bowel probably ilium. Moderate-size hiatal hernia. Vascular/Lymphatic: Atherosclerotic changes aorta and aortic branch vessels.  No abdominal aortic aneurysm or large vessel occlusion. Small lymph nodes left pelvis probably reactive in origin. Reproductive: Post hysterectomy.  No worrisome adnexal mass. Other: No free intraperitoneal air separate from abscess. Musculoskeletal: Scoliosis lumbar spine convex left with superimposed prominent degenerative changes L1-2 through L5-S1 similar to prior exam. IMPRESSION: 1. Abscess associated with sigmoid diverticulitis has decreased slightly in overall dimension since the prior examination currently 3 x 3.7 x 4.2 cm versus prior 3.8 x 4.1 x 4.2 cm. There has been a decrease in the amount of fluid contain within the abscess and slight increase in amount of gas. 2. Remainder of findings without significant change as detailed above. 3.  Aortic Atherosclerosis (ICD10-I70.0). Electronically Signed   By: Genia Del M.D.   On: 12/25/2017 13:08    Anti-infectives: Anti-infectives (From admission, onward)   Start     Dose/Rate Route Frequency Ordered Stop   12/20/17 0600  piperacillin-tazobactam (ZOSYN) IVPB 3.375 g     3.375 g 12.5 mL/hr over 240 Minutes Intravenous Every 12 hours 12/19/17 2032     12/19/17 1800  piperacillin-tazobactam (ZOSYN) IVPB 3.375 g     3.375 g 100 mL/hr over 30 Minutes Intravenous  Once 12/19/17 1748 12/19/17 1827       Assessment/Plan Principal Problem: Diverticulitis of large intestine with abscess Active Problems: Hypercholesterolemia Sepsis ([place]) HTN (hypertension) History of prediabetes GERD (gastroesophageal reflux disease) Anxiety Acute renal failure superimposed on stage 2 chronic kidney disease (HCC) Type II diabetes mellitus with renal manifestations ([place]) Pancreatic cyst  Diverticulitis with abscess - afebrile, VSS - mild abdominal tenderness, having bowel function - CT 7/30 with persistent abscess, IR now has a window for drainage and plans to place drain today - resume FLD after drain placement   FEN:NPO for  procedure  VTE: SCD's,lovenox or heparin okay from our standpoint after drain placement  IW:LNLGX 07/24>>WBC 13.1 07/30  Foley:none Follow up:TBD   DISPO: IR perc drain today    LOS: 7 days    Jill Alexanders , [facility] Surgery 12/26/2017, 11:04 AM Pager: [phone] Consults: [phone] Mon-Fri 7:00 am-4:30 pm Sat-Sun 7:00 am-11:30 am

## 2017-12-26 NOTE — Plan of Care (Signed)
  Problem: Clinical Measurements: Goal: Respiratory complications will improve Outcome: Progressing Goal: Cardiovascular complication will be avoided Outcome: Progressing   Problem: Activity: Goal: Risk for activity intolerance will decrease Outcome: Progressing   Problem: Coping: Goal: Level of anxiety will decrease Outcome: Progressing   

## 2017-12-26 NOTE — Procedures (Signed)
Interventional Radiology Procedure Note  Procedure: CT guided drainage of diverticular abscess  Complications: None  Estimated Blood Loss: < 10 mL  Findings: 10 Fr drain placed in sigmoid diverticular abscess containing mostly air.  Bloody fluid return.  Sample sent for culture.  Venetia Night. Kathlene Cote, M.D Pager:  [phone]

## 2017-12-26 NOTE — Progress Notes (Signed)
Nutrition Follow-up  DOCUMENTATION CODES:   Non-severe (moderate) malnutrition in context of acute illness/injury  INTERVENTION:   -Continue MVI with minerals daily -Continue Glucerna Shake po TID, each supplement provides 220 kcal and 10 grams of protein -RD will continue to follow for diet advancement and adjust supplement regimen as appropriate -RD will continue to assist with obtaining food preferences and meal orders to help optimize oral intake.   NUTRITION DIAGNOSIS:   Moderate Malnutrition related to acute illness(diverticulitis with abscess) as evidenced by energy intake < or equal to 50% for > or equal to 5 days, percent weight loss, mild fat depletion.  Ongoing  GOAL:   Patient will meet greater than or equal to 90% of their needs  Progressing  MONITOR:   PO intake, Labs, Weight trends, Supplement acceptance  REASON FOR ASSESSMENT:   Rounds    ASSESSMENT:    84 yo female admitted with diverticulitis of large intestine with abscess. Pt with hx of HTN, HLD, CKD II, DM, pancreatic cyst  7/30-repeat CT reveals window for collection 7/31- NPO for abscess drain placement with IR  Reviewed I/O's: Net +1.7 L x 24 hours and +7.6 L since admission  Pt out of room at time of visit.   Prior to today, pt continued on a full liquid diet. Meal completion 50-75%. RD has been assisting with obtaining meal orders and preferences. Pt has also been intermittently consuming Glucerna shakes. Per general surgery notes, full liquid diet will be resumed upon drain placement.   Labs reviewed: CBGS: 97-167 (inpatient orders for glycemic control are 0-9 units insulin aspart TID with meals).   Diet Order:   Diet Order           Diet NPO time specified Except for: Sips with Meds  Diet effective midnight          EDUCATION NEEDS:   No education needs have been identified at this time  Skin:  Skin Assessment: Reviewed RN Assessment  Last BM:  12/26/17  Height:   Ht  Readings from Last 1 Encounters:  12/19/17 4\' 10"  (1.473 m)    Weight:   Wt Readings from Last 1 Encounters:  12/19/17 93 lb 0.6 oz (42.2 kg)    Ideal Body Weight:     BMI:  Body mass index is 19.44 kg/m.  Estimated Nutritional Needs:   Kcal:  1300-1500 kcals  Protein:  60-70 g  Fluid:  >/= 1.3 L    Jered Heiny A. Jimmye Norman, RD, LDN, CDE Pager: [phone] After hours Pager: [phone]

## 2017-12-26 NOTE — Progress Notes (Signed)
PROGRESS NOTE    [name]  TML:[id number] DOB: 08-03-1928 DOA: 12/19/2017 PCP: Crist Infante, MD    Brief Narrative:84 year old female, quite independent, living alone, driving and able to do all her ADLs, with history of hypertension, hyperlipidemia, diabetes, history of breast cancer who presents to the hospital with abdominal pain.  Patient has been having abdominal pain for the past 2 to 3 weeks, mainly in the left lower quadrant.  She was evaluated in outpatient and eventually started on ciprofloxacin and metronidazole, and on follow-up visit she had blood work which showed a leukocytosis of 15, she was still having pain and was directed to the emergency room for further evaluation. In the ED she underwent a CT scan of the abdomen and pelvis which showed the presence of diverticulitis as well as a diverticular abscessand she took that for a few days. General surgery was consulted and patient was admitted to the hospital.  IR could not aspirate the abscess initially, she was treated conservatively with IV antibiotics and a repeat CT scan of the abdomen and pelvis is pending on 7/30     Assessment & Plan:   Principal Problem:   Diverticulitis of large intestine with abscess Active Problems:   Hypercholesterolemia   Sepsis ([place])   HTN (hypertension)   History of prediabetes   GERD (gastroesophageal reflux disease)   Anxiety   Acute renal failure superimposed on stage 2 chronic kidney disease (HCC)   Type II diabetes mellitus with renal manifestations ([place])   Pancreatic cyst   Malnutrition of moderate degree   1-Diverticulitis large intestine and abscess;  -Continue with IV zosyn, day 6.  -Repeated CT abdomen 7-30, showed persistent abscess, slight decreased in size.  -Surgery recommended IR consult for drainage.  -IR planning drain placement today.   2- Hyperlipidemia;  Continue with home meds.   3-HTN;  Continue with Norvasc, and metoprolol   4-Anxiety;  Xanax PRN.    5-CKD stage II;  Stable.    History of prediabetes -CBGs well-controlled  Pancreatic cyst -Incidental finding, will need outpatient follow-up per radiology recommendations     DVT prophylaxis: SCD, plan to start Lovenox 8-01 if ok with sx.  Code Status: Full code.  Family Communication: care discussed with patient.  Disposition Plan: to be determine , for abdominal abscess placement today   Consultants:  Surgery IR    Procedures:  IR , Drain placement   Antimicrobials:  Zosyn 7/25   Subjective: Report abdominal pain, very mild.  Report 2-3 loose BM   Objective: Vitals:   12/25/17 1415 12/25/17 2145 12/26/17 0518 12/26/17 1344  BP: (!) 150/72 (!) 151/72 (!) 143/75 137/72  Pulse: (!) 105 90 99 (!) 106  Resp: 16 18 18 15   Temp: 98.2 F (36.8 C) 98.4 F (36.9 C) 98.5 F (36.9 C) 98.2 F (36.8 C)  TempSrc: Oral Oral Oral Oral  SpO2: 98% 96% 98% 99%  Weight:      Height:        Intake/Output Summary (Last 24 hours) at 12/26/2017 1408 Last data filed at 12/26/2017 0600 Gross per 24 hour  Intake 320.55 ml  Output -  Net 320.55 ml   Filed Weights   12/19/17 1744 12/19/17 2144  Weight: 42.2 kg (93 lb) 42.2 kg (93 lb 0.6 oz)    Examination:  General exam: Appears calm and comfortable  Respiratory system: Clear to auscultation. Respiratory effort normal. Cardiovascular system: S1 & S2 heard, RRR. No JVD, murmurs, rubs, gallops or clicks. No  pedal edema. Gastrointestinal system: soft, Mild left Lower quadrant tenderness.  Central nervous system: Alert and oriented. No focal neurological deficits. Extremities: Symmetric 5 x 5 power. Skin: No rashes, lesions or ulcers   Data Reviewed: I have personally reviewed following labs and imaging studies  CBC: Recent Labs  Lab 12/22/17 0524 12/23/17 0458 12/24/17 0654 12/25/17 0634 12/26/17 1246  WBC 17.0* 14.5* 12.5* 13.1* 10.5  HGB 10.3* 11.1* 13.0 12.2 12.4  HCT 32.6* 34.4* 40.2 38.2 38.6  MCV  91.1 90.3 89.7 88.2 89.1  PLT 306 308 427* 389 983   Basic Metabolic Panel: Recent Labs  Lab 12/19/17 1558 12/19/17 2053 12/21/17 0457 12/23/17 0458 12/25/17 0634  NA 132* 139 140 140 139  K 4.8 4.0 3.7 3.7 3.8  CL 98 107 109 108 105  CO2 22 23 23 27 26   GLUCOSE 390* 118* 86 98 119*  BUN 12 10 <5* <5* 5*  CREATININE 1.22* 1.05* 1.03* 1.00 0.88  CALCIUM 9.0 8.6* 7.8* 8.3* 9.1   GFR: Estimated Creatinine Clearance: 29.1 mL/min (by C-G formula based on SCr of 0.88 mg/dL). Liver Function Tests: Recent Labs  Lab 12/19/17 1558 12/19/17 2053  AST 25 19  ALT 21 18  ALKPHOS 61 55  BILITOT 0.4 0.3  PROT 6.6 5.8*  ALBUMIN 3.2* 3.0*   Recent Labs  Lab 12/19/17 1558  LIPASE 39   No results for input(s): AMMONIA in the last 168 hours. Coagulation Profile: Recent Labs  Lab 12/19/17 2053 12/26/17 0602  INR 1.19 1.07   Cardiac Enzymes: No results for input(s): CKTOTAL, CKMB, CKMBINDEX, TROPONINI in the last 168 hours. BNP (last 3 results) No results for input(s): PROBNP in the last 8760 hours. HbA1C: No results for input(s): HGBA1C in the last 72 hours. CBG: Recent Labs  Lab 12/25/17 1237 12/25/17 1721 12/25/17 2141 12/26/17 0807 12/26/17 1136  GLUCAP 104* 167* 97 139* 102*   Lipid Profile: No results for input(s): CHOL, HDL, LDLCALC, TRIG, CHOLHDL, LDLDIRECT in the last 72 hours. Thyroid Function Tests: No results for input(s): TSH, T4TOTAL, FREET4, T3FREE, THYROIDAB in the last 72 hours. Anemia Panel: No results for input(s): VITAMINB12, FOLATE, FERRITIN, TIBC, IRON, RETICCTPCT in the last 72 hours. Sepsis Labs: Recent Labs  Lab 12/19/17 2053 12/19/17 2334  PROCALCITON <0.10  --   LATICACIDVEN 0.9 0.8    Recent Results (from the past 240 hour(s))  Culture, blood (x 2)     Status: None   Collection Time: 12/19/17  9:20 PM  Result Value Ref Range Status   Specimen Description BLOOD RIGHT WRIST  Final   Special Requests   Final    BOTTLES DRAWN  AEROBIC AND ANAEROBIC Blood Culture adequate volume   Culture   Final    NO GROWTH 5 DAYS Performed at [facility], [address] [place], [place] [address]    Report Status 12/24/2017 FINAL  Final  Culture, blood (x 2)     Status: None   Collection Time: 12/19/17 11:33 PM  Result Value Ref Range Status   Specimen Description BLOOD RIGHT WRIST  Final   Special Requests   Final    BOTTLES DRAWN AEROBIC AND ANAEROBIC Blood Culture adequate volume   Culture   Final    NO GROWTH 5 DAYS Performed at [facility], [place] [address] [place],  [address]    Report Status 12/25/2017 FINAL  Final         Radiology Studies: Ct Abdomen Pelvis W Contrast  Result Date: 12/25/2017 CLINICAL DATA:  84 year old female with diverticulitis. Status post antibiotics. Subsequent encounter. EXAM: CT ABDOMEN AND PELVIS WITH CONTRAST TECHNIQUE: Multidetector CT imaging of the abdomen and pelvis was performed using the standard protocol following bolus administration of intravenous contrast. CONTRAST:  54mL OMNIPAQUE IOHEXOL 300 MG/ML  SOLN COMPARISON:  12/19/2017 CT. FINDINGS: Lower chest: Prominent mitral valve calcification. Right coronary artery calcification. Heart size within normal limits. Breast prosthesis in place. No worrisome lung base abnormality. Hepatobiliary: No worrisome hepatic lesion. Prominent size gallbladder without calcified gallstone. No surrounding inflammation. No common bile duct stone. Pancreas: Scattered tiny pancreatic calcifications. Questionable tiny cystic lesion with follow-up as previously noted. Spleen: No splenic mass or enlargement. Adrenals/Urinary Tract: No obstructing stone or hydronephrosis. No worrisome renal or adrenal lesion. Noncontrast filled imaging urinary bladder without gross abnormality. Stomach/Bowel: Abscess associated with sigmoid diverticulitis has decreased slightly in overall dimension since the prior examination currently 3 x 3.7 x 4.2 cm  versus prior 3.8 x 4.1 x 4.2 cm. There has been a decrease in the amount of fluid contain within the abscess and slight increase in amount of gas. Prominent muscular hypertrophy/diverticula throughout the sigmoid colon which remains inflamed. Less notable diverticula throughout the descending colon. 2 cm lipoma within small bowel probably ilium. Moderate-size hiatal hernia. Vascular/Lymphatic: Atherosclerotic changes aorta and aortic branch vessels. No abdominal aortic aneurysm or large vessel occlusion. Small lymph nodes left pelvis probably reactive in origin. Reproductive: Post hysterectomy.  No worrisome adnexal mass. Other: No free intraperitoneal air separate from abscess. Musculoskeletal: Scoliosis lumbar spine convex left with superimposed prominent degenerative changes L1-2 through L5-S1 similar to prior exam. IMPRESSION: 1. Abscess associated with sigmoid diverticulitis has decreased slightly in overall dimension since the prior examination currently 3 x 3.7 x 4.2 cm versus prior 3.8 x 4.1 x 4.2 cm. There has been a decrease in the amount of fluid contain within the abscess and slight increase in amount of gas. 2. Remainder of findings without significant change as detailed above. 3.  Aortic Atherosclerosis (ICD10-I70.0). Electronically Signed   By: Genia Del M.D.   On: 12/25/2017 13:08        Scheduled Meds: . lidocaine      . amLODipine  5 mg Oral Daily  . atorvastatin  20 mg Oral Daily  . cholecalciferol  1,000 Units Oral Daily  . ezetimibe  10 mg Oral Daily  . feeding supplement (GLUCERNA SHAKE)  237 mL Oral TID WC  . insulin aspart  0-9 Units Subcutaneous TID WC  . metoprolol succinate  12.5 mg Oral Daily  . multivitamin with minerals  1 tablet Oral Daily  . pantoprazole  40 mg Oral Daily  . psyllium  1 packet Oral QHS  . raloxifene  60 mg Oral Daily  . saccharomyces boulardii  250 mg Oral BID  . vitamin B-12  500 mcg Oral Daily  . vitamin C  500 mg Oral Daily   Continuous  Infusions: . piperacillin-tazobactam (ZOSYN)  IV 3.375 g (12/26/17 0841)     LOS: 7 days    Time spent: 35 minutes.     Elmarie Shiley, MD [facility] Pager [phone]  If 7PM-7AM, please contact night-coverage [email or web address] Password [email or web address] 12/26/2017, 2:08 PM

## 2017-12-27 LAB — CBC
HCT: 36.4 % (ref 36.0–46.0)
Hemoglobin: 11.6 g/dL — ABNORMAL LOW (ref 12.0–15.0)
MCH: 28.6 pg (ref 26.0–34.0)
MCHC: 31.9 g/dL (ref 30.0–36.0)
MCV: 89.7 fL (ref 78.0–100.0)
Platelets: 363 10*3/uL (ref 150–400)
RBC: 4.06 MIL/uL (ref 3.87–5.11)
RDW: 13.3 % (ref 11.5–15.5)
WBC: 13.6 10*3/uL — ABNORMAL HIGH (ref 4.0–10.5)

## 2017-12-27 LAB — BASIC METABOLIC PANEL
Anion gap: 13 (ref 5–15)
BUN: 10 mg/dL (ref 8–23)
CALCIUM: 9.2 mg/dL (ref 8.9–10.3)
CO2: 25 mmol/L (ref 22–32)
Chloride: 97 mmol/L — ABNORMAL LOW (ref 98–111)
Creatinine, Ser: 0.95 mg/dL (ref 0.44–1.00)
GFR calc Af Amer: >60 mL/min (ref >60)
GFR calc non Af Amer: 52 mL/min — ABNORMAL LOW (ref >60)
Glucose, Bld: 177 mg/dL — ABNORMAL HIGH (ref 70–99)
Potassium: 4.2 mmol/L (ref 3.5–5.1)
Sodium: 135 mmol/L (ref 135–145)

## 2017-12-27 LAB — GLUCOSE, CAPILLARY
Glucose-Capillary: 94 mg/dL (ref 70–99)
Glucose-Capillary: 126 mg/dL — ABNORMAL HIGH (ref 70–99)
Glucose-Capillary: 126 mg/dL — ABNORMAL HIGH (ref 70–99)

## 2017-12-27 NOTE — Progress Notes (Signed)
PROGRESS NOTE    [name]  XNA:[id number] DOB: 1929/03/03 DOA: 12/19/2017 PCP: Crist Infante, MD    Brief Narrative:84 year old female, quite independent, living alone, driving and able to do all her ADLs, with history of hypertension, hyperlipidemia, diabetes, history of breast cancer who presents to the hospital with abdominal pain.  Patient has been having abdominal pain for the past 2 to 3 weeks, mainly in the left lower quadrant.  She was evaluated in outpatient and eventually started on ciprofloxacin and metronidazole, and on follow-up visit she had blood work which showed a leukocytosis of 15, she was still having pain and was directed to the emergency room for further evaluation. In the ED she underwent a CT scan of the abdomen and pelvis which showed the presence of diverticulitis as well as a diverticular abscessand she took that for a few days. General surgery was consulted and patient was admitted to the hospital.  IR could not aspirate the abscess initially, she was treated conservatively with IV antibiotics and a repeat CT scan of the abdomen and pelvis is pending on 7/30     Assessment & Plan:   Principal Problem:   Diverticulitis of large intestine with abscess Active Problems:   Hypercholesterolemia   Sepsis ([place])   HTN (hypertension)   History of prediabetes   GERD (gastroesophageal reflux disease)   Anxiety   Acute renal failure superimposed on stage 2 chronic kidney disease (HCC)   Type II diabetes mellitus with renal manifestations ([place])   Pancreatic cyst   Malnutrition of moderate degree   1-Diverticulitis large intestine and abscess;  -Continue with IV zosyn, day 7 -Repeated CT abdomen 7-30, showed persistent abscess, slight decreased in size.  -Surgery recommended IR consult for drainage.  -underwent drain placement for abscess 7-31.  Feels better.  Repeat WBC in am.    2- Hyperlipidemia;  Continue with home meds.   3-HTN;  Continue with Norvasc,  and metoprolol   4-Anxiety;  Xanax PRN.   5-CKD stage II;  Stable.  Repeat labs today.   History of prediabetes -CBGs well-controlled  Pancreatic cyst -Incidental finding, will need outpatient follow-up per radiology recommendations     DVT prophylaxis: SCD, plan to start Lovenox 8-01 if ok with sx.  Code Status: Full code.  Family Communication: care discussed with patient.  Disposition Plan: to be determine , for abdominal abscess placement today   Consultants:  Surgery IR    Procedures:  IR , Drain placement   Antimicrobials:  Zosyn 7/25   Subjective: Less watery BM, report improvement of abdominal pain.    Objective: Vitals:   12/26/17 1514 12/26/17 2107 12/27/17 0513 12/27/17 1357  BP: 124/66 132/60 135/78 (!) 144/71  Pulse: 100 (!) 102 97 100  Resp: 17 17 17 16   Temp:  98.2 F (36.8 C) 98 F (36.7 C) 98.3 F (36.8 C)  TempSrc:  Oral Oral Oral  SpO2: 99% 99% 98% 100%  Weight:      Height:        Intake/Output Summary (Last 24 hours) at 12/27/2017 1509 Last data filed at 12/27/2017 1116 Gross per 24 hour  Intake 410.28 ml  Output 15 ml  Net 395.28 ml   Filed Weights   12/19/17 1744 12/19/17 2144  Weight: 42.2 kg (93 lb) 42.2 kg (93 lb 0.6 oz)    Examination:  General exam: NAD Respiratory system: CTA Cardiovascular system: S 1, S 2 RRR Gastrointestinal system: soft, mild tenderness, drain in place Central nervous system:  non focal.  Extremities: symmetric power. . Skin: no rashes.    Data Reviewed: I have personally reviewed following labs and imaging studies  CBC: Recent Labs  Lab 12/23/17 0458 12/24/17 0654 12/25/17 0634 12/26/17 1246 12/27/17 0508  WBC 14.5* 12.5* 13.1* 10.5 13.6*  HGB 11.1* 13.0 12.2 12.4 11.6*  HCT 34.4* 40.2 38.2 38.6 36.4  MCV 90.3 89.7 88.2 89.1 89.7  PLT 308 427* 389 368 341   Basic Metabolic Panel: Recent Labs  Lab 12/21/17 0457 12/23/17 0458 12/25/17 0634  NA 140 140 139  K 3.7 3.7  3.8  CL 109 108 105  CO2 23 27 26   GLUCOSE 86 98 119*  BUN <5* <5* 5*  CREATININE 1.03* 1.00 0.88  CALCIUM 7.8* 8.3* 9.1   GFR: Estimated Creatinine Clearance: 29.1 mL/min (by C-G formula based on SCr of 0.88 mg/dL). Liver Function Tests: No results for input(s): AST, ALT, ALKPHOS, BILITOT, PROT, ALBUMIN in the last 168 hours. No results for input(s): LIPASE, AMYLASE in the last 168 hours. No results for input(s): AMMONIA in the last 168 hours. Coagulation Profile: Recent Labs  Lab 12/26/17 0602  INR 1.07   Cardiac Enzymes: No results for input(s): CKTOTAL, CKMB, CKMBINDEX, TROPONINI in the last 168 hours. BNP (last 3 results) No results for input(s): PROBNP in the last 8760 hours. HbA1C: No results for input(s): HGBA1C in the last 72 hours. CBG: Recent Labs  Lab 12/26/17 0807 12/26/17 1136 12/26/17 1655 12/26/17 2105 12/27/17 0851  GLUCAP 139* 102* 101* 171* 94   Lipid Profile: No results for input(s): CHOL, HDL, LDLCALC, TRIG, CHOLHDL, LDLDIRECT in the last 72 hours. Thyroid Function Tests: No results for input(s): TSH, T4TOTAL, FREET4, T3FREE, THYROIDAB in the last 72 hours. Anemia Panel: No results for input(s): VITAMINB12, FOLATE, FERRITIN, TIBC, IRON, RETICCTPCT in the last 72 hours. Sepsis Labs: No results for input(s): PROCALCITON, LATICACIDVEN in the last 168 hours.  Recent Results (from the past 240 hour(s))  Culture, blood (x 2)     Status: None   Collection Time: 12/19/17  9:20 PM  Result Value Ref Range Status   Specimen Description BLOOD RIGHT WRIST  Final   Special Requests   Final    BOTTLES DRAWN AEROBIC AND ANAEROBIC Blood Culture adequate volume   Culture   Final    NO GROWTH 5 DAYS Performed at [facility], [address], [place], [place] [address]    Report Status 12/24/2017 FINAL  Final  Culture, blood (x 2)     Status: None   Collection Time: 12/19/17 11:33 PM  Result Value Ref Range Status   Specimen Description BLOOD RIGHT  WRIST  Final   Special Requests   Final    BOTTLES DRAWN AEROBIC AND ANAEROBIC Blood Culture adequate volume   Culture   Final    NO GROWTH 5 DAYS Performed at [facility], [place] [address] [place], [place] [address]    Report Status 12/25/2017 FINAL  Final  Aerobic/Anaerobic Culture (surgical/deep wound)     Status: None (Preliminary result)   Collection Time: 12/26/17  3:25 PM  Result Value Ref Range Status   Specimen Description ABSCESS  Final   Special Requests DIVERTICULAR ABSCESS  Final   Gram Stain   Final    FEW WBC PRESENT,BOTH PMN AND MONONUCLEAR NO ORGANISMS SEEN    Culture   Final    NO GROWTH < 24 HOURS Performed at [facility], [place] [address] [place], [place]  [address]    Report Status PENDING  Incomplete         Radiology Studies: Ct Image Guided Drainage By Percutaneous Catheter  Result Date: 12/26/2017 CLINICAL DATA:  Sigmoid diverticular abscess requiring percutaneous drainage. EXAM: CT GUIDED CATHETER DRAINAGE OF DIVERTICULAR PERITONEAL ABSCESS ANESTHESIA/SEDATION: 1.0 mg IV Versed 50 mcg IV Fentanyl Total Moderate Sedation Time:  15 minutes The patient's level of consciousness and physiologic status were continuously monitored during the procedure by Radiology nursing. PROCEDURE: The procedure, risks, benefits, and alternatives were explained to the patient. Questions regarding the procedure were encouraged and answered. The patient understands and consents to the procedure. A time out was performed prior to initiating the procedure. CT was performed in supine and oblique positions with the left side rolled up. The left lower abdominal wall was prepped with chlorhexidine in a sterile fashion, and a sterile drape was applied covering the operative field. A sterile gown and sterile gloves were used for the procedure. Local anesthesia was provided with 1% Lidocaine. Under CT guidance, an 18 gauge trocar needle was advanced to the level of a sigmoid  diverticular abscess. A fluid sample was aspirated and sent for culture analysis. A guidewire was advanced through the needle. The tract was dilated and a 10 French pigtail drainage catheter advanced over the wire. Catheter was formed and position confirmed by CT. The catheter was flushed with saline and connected to a suction bulb. COMPLICATIONS: None FINDINGS: In the flat supine position, there was no percutaneous window available to drain the sigmoid diverticular abscess due to overlying small bowel. The abscess cavity predominantly contains air and measures approximately 2.2 cm. With change in positioning and the left side rolled up, there was a percutaneous window available from the left lower abdominal wall to drain the abscess. Needle aspiration yielded bloody fluid and a sample was sent for culture analysis. A 10 French drainage catheter was formed in the collection and after attaching the catheter to a suction bulb, CT shows no further air in the cavity and a collapsed cavity. IMPRESSION: CT-guided percutaneous catheter drainage of sigmoid diverticular abscess with placement of a 10 French drainage catheter. Bloody fluid return was sent for culture analysis. The catheter was attached to suction bulb drainage. Electronically Signed   By: Aletta Edouard M.D.   On: 12/26/2017 15:46        Scheduled Meds: . amLODipine  5 mg Oral Daily  . atorvastatin  20 mg Oral Daily  . cholecalciferol  1,000 Units Oral Daily  . ezetimibe  10 mg Oral Daily  . feeding supplement (GLUCERNA SHAKE)  237 mL Oral TID WC  . insulin aspart  0-9 Units Subcutaneous TID WC  . metoprolol succinate  12.5 mg Oral Daily  . multivitamin with minerals  1 tablet Oral Daily  . pantoprazole  40 mg Oral Daily  . psyllium  1 packet Oral QHS  . raloxifene  60 mg Oral Daily  . saccharomyces boulardii  250 mg Oral BID  . sodium chloride flush  5 mL Intracatheter Q8H  . vitamin B-12  500 mcg Oral Daily  . vitamin C  500 mg Oral  Daily   Continuous Infusions: . piperacillin-tazobactam (ZOSYN)  IV 3.375 g (12/27/17 1124)     LOS: 8 days    Time spent: 35 minutes.     Elmarie Shiley, MD [facility] Pager [phone]  If 7PM-7AM, please contact night-coverage [email or web address] Password [email or web address] 12/27/2017, 3:09 PM

## 2017-12-27 NOTE — Progress Notes (Signed)
Referring Physician(s): CCM Dr Shann Medal   Supervising Physician: Markus Daft  Patient Status:  [facility] - In-pt  Chief Complaint:  Diverticular abscess drain placed LLQ 7/31   Subjective:  Resting comfortably No complaint OP 15 cc yesterday; bloody 10 cc in JP  Allergies: Patient has no known allergies.  Medications: Prior to Admission medications   Medication Sig Start Date End Date Taking? Authorizing Provider  ALPRAZolam Duanne Moron) 0.25 MG tablet Take 1/2 tablet by mouth at bedtime as needed for anxiety (sleep)   Yes [provider]  Ascorbic Acid (VITAMIN C) 500 MG tablet Take 500 mg by mouth daily.     Yes [provider]  aspirin EC 81 MG tablet Take 1 tablet (81 mg total) by mouth daily. 10/18/16  Yes Weaver, Scott T, PA-C  atorvastatin (LIPITOR) 20 MG tablet Take 20 mg by mouth daily. 03/02/16  Yes [provider]  Cholecalciferol (VITAMIN D) 1000 UNITS capsule Take 1,000 Units by mouth daily.     Yes [provider]  Coenzyme Q10 (CO Q-10) 200 MG CAPS Take 1 capsule by mouth daily.    Yes [provider]  ezetimibe (ZETIA) 10 MG tablet Take 10 mg by mouth daily. 02/26/16  Yes [provider]  Ibuprofen-Diphenhydramine Cit (ADVIL PM PO) Take 1 tablet by mouth at bedtime.    Yes [provider]  losartan (COZAAR) 25 MG tablet Take 1 tablet (25 mg total) by mouth daily. 04/30/17  Yes Nahser, Wonda Cheng, MD  metoprolol succinate (TOPROL-XL) 25 MG 24 hr tablet TAKE 1/2 TABLET BY MOUTH DAILY 10/01/17  Yes Nahser, Wonda Cheng, MD  metroNIDAZOLE (FLAGYL) 500 MG tablet Take 500 mg by mouth 3 (three) times daily. 12/17/17 12/27/17 Yes [provider]  Multiple Vitamin (MULTIVITAMIN) tablet Take 1 tablet by mouth daily.     Yes [provider]  omeprazole (PRILOSEC) 20 MG capsule Take 1 capsule (20 mg total) by mouth daily. 05/27/15  Yes Darlin Coco, MD  Psyllium (METAMUCIL PO) Take 1 Dose by mouth at  bedtime. Mix one teaspoon with 8oz of water.   Yes [provider]  raloxifene (EVISTA) 60 MG tablet TAKE 1 TABLET BY MOUTH ONCE DAILY Patient taking differently: TAKE 60MG  BY MOUTH ONCE DAILY 12/23/14  Yes Darlin Coco, MD  vitamin B-12 (CYANOCOBALAMIN) 500 MCG tablet Take 500 mcg by mouth daily.    Yes [provider]     Vital Signs: BP 135/78 (BP Location: Right Arm)   Pulse 97   Temp 98 F (36.7 C) (Oral)   Resp 17   Ht 4\' 10"  (1.473 m)   Wt 93 lb 0.6 oz (42.2 kg)   SpO2 98%   BMI 19.44 kg/m   Physical Exam  Constitutional: She is oriented to person, place, and time.  Neurological: She is alert and oriented to person, place, and time.  Skin: Skin is warm and dry.  Site is clean and dry NT no bleeding OP blood tinged 15 cc yesterday 10 cc in JP Cx NGTD  Psychiatric: She has a normal mood and affect. Her behavior is normal.  Nursing note and vitals reviewed.   Imaging: Ct Abdomen Pelvis W Contrast  Result Date: 12/25/2017 CLINICAL DATA:  84 year old female with diverticulitis. Status post antibiotics. Subsequent encounter. EXAM: CT ABDOMEN AND PELVIS WITH CONTRAST TECHNIQUE: Multidetector CT imaging of the abdomen and pelvis was performed using the standard protocol following bolus administration of intravenous contrast. CONTRAST:  33mL OMNIPAQUE IOHEXOL 300  MG/ML  SOLN COMPARISON:  12/19/2017 CT. FINDINGS: Lower chest: Prominent mitral valve calcification. Right coronary artery calcification. Heart size within normal limits. Breast prosthesis in place. No worrisome lung base abnormality. Hepatobiliary: No worrisome hepatic lesion. Prominent size gallbladder without calcified gallstone. No surrounding inflammation. No common bile duct stone. Pancreas: Scattered tiny pancreatic calcifications. Questionable tiny cystic lesion with follow-up as previously noted. Spleen: No splenic mass or enlargement. Adrenals/Urinary Tract: No obstructing stone or  hydronephrosis. No worrisome renal or adrenal lesion. Noncontrast filled imaging urinary bladder without gross abnormality. Stomach/Bowel: Abscess associated with sigmoid diverticulitis has decreased slightly in overall dimension since the prior examination currently 3 x 3.7 x 4.2 cm versus prior 3.8 x 4.1 x 4.2 cm. There has been a decrease in the amount of fluid contain within the abscess and slight increase in amount of gas. Prominent muscular hypertrophy/diverticula throughout the sigmoid colon which remains inflamed. Less notable diverticula throughout the descending colon. 2 cm lipoma within small bowel probably ilium. Moderate-size hiatal hernia. Vascular/Lymphatic: Atherosclerotic changes aorta and aortic branch vessels. No abdominal aortic aneurysm or large vessel occlusion. Small lymph nodes left pelvis probably reactive in origin. Reproductive: Post hysterectomy.  No worrisome adnexal mass. Other: No free intraperitoneal air separate from abscess. Musculoskeletal: Scoliosis lumbar spine convex left with superimposed prominent degenerative changes L1-2 through L5-S1 similar to prior exam. IMPRESSION: 1. Abscess associated with sigmoid diverticulitis has decreased slightly in overall dimension since the prior examination currently 3 x 3.7 x 4.2 cm versus prior 3.8 x 4.1 x 4.2 cm. There has been a decrease in the amount of fluid contain within the abscess and slight increase in amount of gas. 2. Remainder of findings without significant change as detailed above. 3.  Aortic Atherosclerosis (ICD10-I70.0). Electronically Signed   By: Genia Del M.D.   On: 12/25/2017 13:08   Ct Image Guided Drainage By Percutaneous Catheter  Result Date: 12/26/2017 CLINICAL DATA:  Sigmoid diverticular abscess requiring percutaneous drainage. EXAM: CT GUIDED CATHETER DRAINAGE OF DIVERTICULAR PERITONEAL ABSCESS ANESTHESIA/SEDATION: 1.0 mg IV Versed 50 mcg IV Fentanyl Total Moderate Sedation Time:  15 minutes The patient's  level of consciousness and physiologic status were continuously monitored during the procedure by Radiology nursing. PROCEDURE: The procedure, risks, benefits, and alternatives were explained to the patient. Questions regarding the procedure were encouraged and answered. The patient understands and consents to the procedure. A time out was performed prior to initiating the procedure. CT was performed in supine and oblique positions with the left side rolled up. The left lower abdominal wall was prepped with chlorhexidine in a sterile fashion, and a sterile drape was applied covering the operative field. A sterile gown and sterile gloves were used for the procedure. Local anesthesia was provided with 1% Lidocaine. Under CT guidance, an 18 gauge trocar needle was advanced to the level of a sigmoid diverticular abscess. A fluid sample was aspirated and sent for culture analysis. A guidewire was advanced through the needle. The tract was dilated and a 10 French pigtail drainage catheter advanced over the wire. Catheter was formed and position confirmed by CT. The catheter was flushed with saline and connected to a suction bulb. COMPLICATIONS: None FINDINGS: In the flat supine position, there was no percutaneous window available to drain the sigmoid diverticular abscess due to overlying small bowel. The abscess cavity predominantly contains air and measures approximately 2.2 cm. With change in positioning and the left side rolled up, there was a percutaneous window available from the left lower abdominal wall  to drain the abscess. Needle aspiration yielded bloody fluid and a sample was sent for culture analysis. A 10 French drainage catheter was formed in the collection and after attaching the catheter to a suction bulb, CT shows no further air in the cavity and a collapsed cavity. IMPRESSION: CT-guided percutaneous catheter drainage of sigmoid diverticular abscess with placement of a 10 French drainage catheter. Bloody  fluid return was sent for culture analysis. The catheter was attached to suction bulb drainage. Electronically Signed   By: Aletta Edouard M.D.   On: 12/26/2017 15:46    Labs:  CBC: Recent Labs    12/24/17 0654 12/25/17 0634 12/26/17 1246 12/27/17 0508  WBC 12.5* 13.1* 10.5 13.6*  HGB 13.0 12.2 12.4 11.6*  HCT 40.2 38.2 38.6 36.4  PLT 427* 389 368 363    COAGS: Recent Labs    12/19/17 2053 12/26/17 0602  INR 1.19 1.07  APTT 35  --     BMP: Recent Labs    12/19/17 2053 12/21/17 0457 12/23/17 0458 12/25/17 0634  NA 139 140 140 139  K 4.0 3.7 3.7 3.8  CL 107 109 108 105  CO2 23 23 27 26   GLUCOSE 118* 86 98 119*  BUN 10 <5* <5* 5*  CALCIUM 8.6* 7.8* 8.3* 9.1  CREATININE 1.05* 1.03* 1.00 0.88  GFRNONAA 47* 48* 49* 57*  GFRAA 54* 55* 57* >60    LIVER FUNCTION TESTS: Recent Labs    12/19/17 1558 12/19/17 2053  BILITOT 0.4 0.3  AST 25 19  ALT 21 18  ALKPHOS 61 55  PROT 6.6 5.8*  ALBUMIN 3.2* 3.0*    Assessment and Plan:  Divertic abscess LLQ drain placed in IR 7/31 Will follow If home with drain-- needs 5-10 cc flushes daily; record OP IR OP Clinic will see pt -- she will hear from scheduler for time and date Orders in place   Electronically Signed: Valiant Dills A, PA-C 12/27/2017, 8:26 AM   I spent a total of 15 Minutes at the the patient's bedside AND on the patient's hospital floor or unit, greater than 50% of which was counseling/coordinating care for LLQ abscess drain

## 2017-12-27 NOTE — Plan of Care (Signed)

## 2017-12-27 NOTE — Evaluation (Signed)
Physical Therapy Evaluation Patient Details Name: [name] MRN: [id number] DOB: 1928-09-09 Today's Date: 12/27/2017   History of Present Illness  [name] is an 84yo female who comes to [facility] on 7/24 after several weeks of ABD pain: pt admitted with diverticulitis. Imaging significant for 4cm abscess, now s/p drainage on 7/31, JP drain in place. PMH: HTN, HLD, GERD, GAD, diverticulitis, CKD, BrCA s/p Lt mastectomy. PTA pt was a fuly independent community AMB, still cooking, driving, grocerying, and all withotu assistive device. No recent falls history.    Clinical Impression  Pt admitted with above diagnosis. Pt currently with functional limitations due to the deficits listed below (see "PT Problem List"). Upon entry, pt in chair paying bills preparing to mail, friend present. The pt is awake and agreeable to participate. The pt is alert and oriented x3, pleasant, conversational, and following simple commands consistently. Pt AMB nearly 635f without limitation, no assistive device, verbalizes that she is minimally weaker and less steady than baseline, but this is not visibly noted by author. AMB speed is very close to that of full community AMB. Pt performs 4 steps with one rail with minimal effort, she has 5 to enter the home. Functional mobility assessment demonstrates minimally increased effort/time requirements, but overall good tolerance, and no need for physical assistance, whereas the patient performed these at a higher level of independence PTA. Pt will benefit from skilled PT intervention to prevent functional decline in the setting of a long hospitalization and maintain independence and safety with basic mobility in preparation for discharge to home.        Follow Up Recommendations No PT follow up    Equipment Recommendations  None recommended by PT    Recommendations for Other Services       Precautions / Restrictions Precautions Precautions: None Precaution Comments: JP drain  in place.  Restrictions Weight Bearing Restrictions: No      Mobility  Bed Mobility Overal bed mobility: (received up in chair. )                Transfers Overall transfer level: Independent Equipment used: None                Ambulation/Gait Ambulation/Gait assistance: Modified independent (Device/Increase time) Gait Distance (Feet): 585 Feet Assistive device: None   Gait velocity: 0.777m  Gait velocity interpretation: 1.31 - 2.62 ft/sec, indicative of limited community ambulator    Stairs Stairs: Yes Stairs assistance: Min guard Stair Management: One rail Left Number of Stairs: 4 General stair comments: limited by length of IV line only. Moving quite well.   Wheelchair Mobility    Modified Rankin (Stroke Patients Only)       Balance Overall balance assessment: No apparent balance deficits (not formally assessed)                                           Pertinent Vitals/Pain Pain Assessment: No/denies pain(soreness near site of procedure. )    Home Living Family/patient expects to be discharged to:: Private residence Living Arrangements: Alone Available Help at Discharge: Friend(s)(lots of friends in the area. ) Type of Home: House Home Access: Stairs to enter Entrance Stairs-Rails: RiPsychiatric nursef Steps: 5 Home Layout: One level Home Equipment: None      Prior Function Level of Independence: Independent         Comments: AMB independently,  no mobility restrictions, drives, grocery shopping.      Hand Dominance        Extremity/Trunk Assessment             Cervical / Trunk Assessment Cervical / Trunk Assessment: Normal  Communication      Cognition Arousal/Alertness: Awake/alert Behavior During Therapy: WFL for tasks assessed/performed Overall Cognitive Status: Within Functional Limits for tasks assessed                                        General  Comments      Exercises     Assessment/Plan    PT Assessment Patient needs continued PT services  PT Problem List Decreased strength;Decreased activity tolerance;Decreased balance       PT Treatment Interventions Functional mobility training;Therapeutic activities;Balance training    PT Goals (Current goals can be found in the Care Plan section)  Acute Rehab PT Goals Patient Stated Goal: return to home and regain tolerance to her baseline activities.  PT Goal Formulation: With patient Time For Goal Achievement: 01/03/18 Potential to Achieve Goals: Good    Frequency Min 2X/week   Barriers to discharge        Co-evaluation               AM-PAC PT "6 Clicks" Daily Activity  Outcome Measure Difficulty turning over in bed (including adjusting bedclothes, sheets and blankets)?: None Difficulty moving from lying on back to sitting on the side of the bed? : None Difficulty sitting down on and standing up from a chair with arms (e.g., wheelchair, bedside commode, etc,.)?: A Little Help needed moving to and from a bed to chair (including a wheelchair)?: None Help needed walking in hospital room?: None Help needed climbing 3-5 steps with a railing? : A Little 6 Click Score: 22    End of Session   Activity Tolerance: Patient tolerated treatment well Patient left: in chair;with call bell/phone within reach;with family/visitor present Nurse Communication: Mobility status PT Visit Diagnosis: Difficulty in walking, not elsewhere classified (R26.2);Other abnormalities of gait and mobility (R26.89)    Time: 8299-3716 PT Time Calculation (min) (ACUTE ONLY): 20 min   Charges:   PT Evaluation $PT Eval Low Complexity: 1 Low PT Treatments $Therapeutic Activity: 8-22 mins        4:00 PM, 12/27/17 Etta Grandchild, PT, DPT Physical Therapist - [place] [phone] (Pager)  [phone] (Office)      Zaida Reiland C 12/27/2017, 3:57 PM

## 2017-12-27 NOTE — Progress Notes (Signed)
Nutrition Follow-up  DOCUMENTATION CODES:   Non-severe (moderate) malnutrition in context of acute illness/injury  INTERVENTION:   -Continue MVI with minerals daily -Continue Glucerna Shake po TID, each supplement provides 220 kcal and 10 grams of protein  NUTRITION DIAGNOSIS:   Moderate Malnutrition related to acute illness(diverticulitis with abscess) as evidenced by energy intake < or equal to 50% for > or equal to 5 days, percent weight loss, mild fat depletion.  Ongoing  GOAL:   Patient will meet greater than or equal to 90% of their needs  Progressing  MONITOR:   PO intake, Labs, Weight trends, Supplement acceptance  REASON FOR ASSESSMENT:   Rounds    ASSESSMENT:    84 yo female admitted with diverticulitis of large intestine with abscess. Pt with hx of HTN, HLD, CKD II, DM, pancreatic cyst   7/30-repeat CT reveals window for collection 7/31- s/p abscess drain placement with IR  Reviewed I/O's: Net +155 ml x 24 hours and +7.7 L since admission.  Pt sitting in recliner and in good spirits at time of visit. Pt shares variable intake on full liquid diet, due to limitation of diet and food preferences. She is very eager to start solid foods and suspects intake will improve with diet advancement. She reports occasionally consuming Glucerna shakes, however, thinks they may be attributing to her heartburn.   Pt shares that a nutrition ambassador has already come for her dinner and breakfast order and she is satisfied with what she ordered currently. She denies any further needs or nutrition-related questions at this time.   Per MD notes, likely d/c home tomorrow if pt able to tolerate solid foods.   Labs reviewed: CBGS: 94-171 (inpatient orders for glycemic control are 0-9 units insulin aspart TID with meals).   Diet Order:   Diet Order           Diet Carb Modified Fluid consistency: Thin; Room service appropriate? Yes  Diet effective now          EDUCATION  NEEDS:   No education needs have been identified at this time  Skin:  Skin Assessment: Reviewed RN Assessment  Last BM:  12/26/17  Height:   Ht Readings from Last 1 Encounters:  12/19/17 4\' 10"  (1.473 m)    Weight:   Wt Readings from Last 1 Encounters:  12/19/17 93 lb 0.6 oz (42.2 kg)    Ideal Body Weight:     BMI:  Body mass index is 19.44 kg/m.  Estimated Nutritional Needs:   Kcal:  1300-1500 kcals  Protein:  60-70 g  Fluid:  >/= 1.3 L    Jaterrius Ricketson A. Jimmye Norman, RD, LDN, CDE Pager: [phone] After hours Pager: [phone]

## 2017-12-27 NOTE — Progress Notes (Signed)
Patient ID: [name], female   DOB: [birth year], 84 y.o.   MRN: [id number]       Subjective: Pt feels well today after drain placed yesterday.  Less pain.  Doesn't like the full liquids.  They are too sweet.  Objective: Vital signs in last 24 hours: Temp:  [98 F (36.7 C)-98.2 F (36.8 C)] 98 F (36.7 C) (08/01 0513) Pulse Rate:  [97-102] 97 (08/01 0513) Resp:  [7-20] 17 (08/01 0513) BP: (124-138)/(60-78) 135/78 (08/01 0513) SpO2:  [98 %-100 %] 98 % (08/01 0513) Last BM Date: 12/26/17  Intake/Output from previous day: 07/31 0701 - 08/01 0700 In: 170.3 [P.O.:120; I.V.:5; IV Piggyback:30.3] Out: 15 [Drains:15] Intake/Output this shift: Total I/O In: 240 [P.O.:240] Out: -   PE: Abd: soft, NT, drain in place with minimal serosang output, 15cc, +BS  Lab Results:  Recent Labs    12/26/17 1246 12/27/17 0508  WBC 10.5 13.6*  HGB 12.4 11.6*  HCT 38.6 36.4  PLT 368 363   BMET Recent Labs    12/25/17 0634  NA 139  K 3.8  CL 105  CO2 26  GLUCOSE 119*  BUN 5*  CREATININE 0.88  CALCIUM 9.1   PT/INR Recent Labs    12/26/17 0602  LABPROT 13.8  INR 1.07   CMP     Component Value Date/Time   NA 139 12/25/2017 0634   NA 141 10/17/2016 0945   K 3.8 12/25/2017 0634   CL 105 12/25/2017 0634   CO2 26 12/25/2017 0634   GLUCOSE 119 (H) 12/25/2017 0634   BUN 5 (L) 12/25/2017 0634   BUN 14 10/17/2016 0945   CREATININE 0.88 12/25/2017 0634   CREATININE 0.88 04/10/2016 0953   CALCIUM 9.1 12/25/2017 0634   PROT 5.8 (L) 12/19/2017 2053   PROT 6.4 10/17/2016 0945   ALBUMIN 3.0 (L) 12/19/2017 2053   ALBUMIN 4.1 10/17/2016 0945   AST 19 12/19/2017 2053   ALT 18 12/19/2017 2053   ALKPHOS 55 12/19/2017 2053   BILITOT 0.3 12/19/2017 2053   BILITOT 0.3 10/17/2016 0945   GFRNONAA 57 (L) 12/25/2017 0634   GFRAA >60 12/25/2017 0634   Lipase     Component Value Date/Time   LIPASE 39 12/19/2017 1558       Studies/Results: Ct Image Guided Drainage By  Percutaneous Catheter  Result Date: 12/26/2017 CLINICAL DATA:  Sigmoid diverticular abscess requiring percutaneous drainage. EXAM: CT GUIDED CATHETER DRAINAGE OF DIVERTICULAR PERITONEAL ABSCESS ANESTHESIA/SEDATION: 1.0 mg IV Versed 50 mcg IV Fentanyl Total Moderate Sedation Time:  15 minutes The patient's level of consciousness and physiologic status were continuously monitored during the procedure by Radiology nursing. PROCEDURE: The procedure, risks, benefits, and alternatives were explained to the patient. Questions regarding the procedure were encouraged and answered. The patient understands and consents to the procedure. A time out was performed prior to initiating the procedure. CT was performed in supine and oblique positions with the left side rolled up. The left lower abdominal wall was prepped with chlorhexidine in a sterile fashion, and a sterile drape was applied covering the operative field. A sterile gown and sterile gloves were used for the procedure. Local anesthesia was provided with 1% Lidocaine. Under CT guidance, an 18 gauge trocar needle was advanced to the level of a sigmoid diverticular abscess. A fluid sample was aspirated and sent for culture analysis. A guidewire was advanced through the needle. The tract was dilated and a 10 French pigtail drainage catheter advanced over the wire. Catheter was  formed and position confirmed by CT. The catheter was flushed with saline and connected to a suction bulb. COMPLICATIONS: None FINDINGS: In the flat supine position, there was no percutaneous window available to drain the sigmoid diverticular abscess due to overlying small bowel. The abscess cavity predominantly contains air and measures approximately 2.2 cm. With change in positioning and the left side rolled up, there was a percutaneous window available from the left lower abdominal wall to drain the abscess. Needle aspiration yielded bloody fluid and a sample was sent for culture analysis. A 10  French drainage catheter was formed in the collection and after attaching the catheter to a suction bulb, CT shows no further air in the cavity and a collapsed cavity. IMPRESSION: CT-guided percutaneous catheter drainage of sigmoid diverticular abscess with placement of a 10 French drainage catheter. Bloody fluid return was sent for culture analysis. The catheter was attached to suction bulb drainage. Electronically Signed   By: Aletta Edouard M.D.   On: 12/26/2017 15:46    Anti-infectives: Anti-infectives (From admission, onward)   Start     Dose/Rate Route Frequency Ordered Stop   12/20/17 0600  piperacillin-tazobactam (ZOSYN) IVPB 3.375 g     3.375 g 12.5 mL/hr over 240 Minutes Intravenous Every 12 hours 12/19/17 2032     12/19/17 1800  piperacillin-tazobactam (ZOSYN) IVPB 3.375 g     3.375 g 100 mL/hr over 30 Minutes Intravenous  Once 12/19/17 1748 12/19/17 1827       Assessment/Plan Principal Problem: Diverticulitis of large intestine with abscess Active Problems: Hypercholesterolemia Sepsis ([place]) HTN (hypertension) History of prediabetes GERD (gastroesophageal reflux disease) Anxiety Acute renal failure superimposed on stage 2 chronic kidney disease (HCC) Type II diabetes mellitus with renal manifestations ([place]) Pancreatic cyst  Diverticulitis with abscess - afebrile, VSS - improving after drain placement.  WBC up slightly today to 13, but expected after drain placement.  HLK:TGYB mod diet VTE: SCD's,lovenoxor heparinokay from our standpoint after drain placement  WL:SLHTD 07/24>>? duration Foley:none Follow up:TBD   DISPO: advance diet and plan for DC home tomorrow if tolerates her diet   LOS: 8 days    Henreitta Cea , [facility] Surgery 12/27/2017, 1:53 PM Pager: [phone]

## 2017-12-28 LAB — BASIC METABOLIC PANEL
ANION GAP: 9 (ref 5–15)
BUN: 10 mg/dL (ref 8–23)
CO2: 27 mmol/L (ref 22–32)
Calcium: 8.9 mg/dL (ref 8.9–10.3)
Chloride: 99 mmol/L (ref 98–111)
Creatinine, Ser: 1.05 mg/dL — ABNORMAL HIGH (ref 0.44–1.00)
GFR calc Af Amer: 54 mL/min — ABNORMAL LOW (ref >60)
GFR, EST NON AFRICAN AMERICAN: 46 mL/min — AB (ref >60)
GLUCOSE: 102 mg/dL — AB (ref 70–99)
POTASSIUM: 4 mmol/L (ref 3.5–5.1)
Sodium: 135 mmol/L (ref 135–145)

## 2017-12-28 LAB — CBC
HCT: 34.7 % — ABNORMAL LOW (ref 36.0–46.0)
HEMOGLOBIN: 11.1 g/dL — AB (ref 12.0–15.0)
MCH: 28.5 pg (ref 26.0–34.0)
MCHC: 32 g/dL (ref 30.0–36.0)
MCV: 89 fL (ref 78.0–100.0)
PLATELETS: 312 10*3/uL (ref 150–400)
RBC: 3.9 MIL/uL (ref 3.87–5.11)
RDW: 13.3 % (ref 11.5–15.5)
WBC: 13.5 10*3/uL — AB (ref 4.0–10.5)

## 2017-12-28 LAB — GLUCOSE, CAPILLARY
GLUCOSE-CAPILLARY: 116 mg/dL — AB (ref 70–99)
Glucose-Capillary: 227 mg/dL — ABNORMAL HIGH (ref 70–99)

## 2017-12-28 MED ORDER — PIPERACILLIN-TAZOBACTAM 3.375 G IVPB: 3.3750 g | Freq: Three times a day (TID) | INTRAVENOUS | Status: DC

## 2017-12-28 MED ORDER — METFORMIN HCL 500 MG PO TABS: 500.0000 mg | ORAL_TABLET | Freq: Two times a day (BID) | ORAL | 11 refills | Status: DC

## 2017-12-28 MED ORDER — SACCHAROMYCES BOULARDII 250 MG PO CAPS: 250.0000 mg | ORAL_CAPSULE | Freq: Two times a day (BID) | ORAL | 0 refills | Status: DC

## 2017-12-28 MED ORDER — AMOXICILLIN-POT CLAVULANATE 875-125 MG PO TABS: 1.0000 | ORAL_TABLET | Freq: Two times a day (BID) | ORAL | 0 refills | Status: AC

## 2017-12-28 MED ORDER — SODIUM CHLORIDE 0.9 % IV SOLN
INTRAVENOUS | Status: DC
  Administered 2017-12-28: 09:00:00 via INTRAVENOUS

## 2017-12-28 MED ORDER — SODIUM CHLORIDE 0.9% FLUSH: 10.0000 mL | INTRAVENOUS | Status: DC | PRN

## 2017-12-28 MED ORDER — TRAMADOL HCL 50 MG PO TABS: 25.0000 mg | ORAL_TABLET | Freq: Three times a day (TID) | ORAL | 0 refills | Status: DC | PRN

## 2017-12-28 MED ORDER — AMLODIPINE BESYLATE 5 MG PO TABS: 5.0000 mg | ORAL_TABLET | Freq: Every day | ORAL | 0 refills | Status: DC

## 2017-12-28 NOTE — Progress Notes (Signed)
Subjective: Pt feeling well today.  No new complaints.  Tolerating diet with no increase in pain.    Objective: Vital signs in last 24 hours: Temp:  [98.2 F (36.8 C)-98.3 F (36.8 C)] 98.2 F (36.8 C) (08/02 0532) Pulse Rate:  [86-100] 86 (08/02 0532) Resp:  [16-19] 19 (08/02 0532) BP: (130-144)/(70-71) 130/71 (08/02 0532) SpO2:  [98 %-100 %] 98 % (08/02 0532) Last BM Date: 12/27/17  Intake/Output from previous day: 08/01 0701 - 08/02 0700 In: 691.9 [P.O.:680; IV Piggyback:6.9] Out: 8 [Drains:8] Intake/Output this shift: Total I/O In: 255 [P.O.:250; Other:5] Out: -   PE: Abd: soft, nontender except around her drain, +BS, ND, JP with minimal serosang output.  Lab Results:  Recent Labs    12/27/17 0508 12/28/17 0517  WBC 13.6* 13.5*  HGB 11.6* 11.1*  HCT 36.4 34.7*  PLT 363 312   BMET Recent Labs    12/27/17 1405 12/28/17 0517  NA 135 135  K 4.2 4.0  CL 97* 99  CO2 25 27  GLUCOSE 177* 102*  BUN 10 10  CREATININE 0.95 1.05*  CALCIUM 9.2 8.9   PT/INR Recent Labs    12/26/17 0602  LABPROT 13.8  INR 1.07   CMP     Component Value Date/Time   NA 135 12/28/2017 0517   NA 141 10/17/2016 0945   K 4.0 12/28/2017 0517   CL 99 12/28/2017 0517   CO2 27 12/28/2017 0517   GLUCOSE 102 (H) 12/28/2017 0517   BUN 10 12/28/2017 0517   BUN 14 10/17/2016 0945   CREATININE 1.05 (H) 12/28/2017 0517   CREATININE 0.88 04/10/2016 0953   CALCIUM 8.9 12/28/2017 0517   PROT 5.8 (L) 12/19/2017 2053   PROT 6.4 10/17/2016 0945   ALBUMIN 3.0 (L) 12/19/2017 2053   ALBUMIN 4.1 10/17/2016 0945   AST 19 12/19/2017 2053   ALT 18 12/19/2017 2053   ALKPHOS 55 12/19/2017 2053   BILITOT 0.3 12/19/2017 2053   BILITOT 0.3 10/17/2016 0945   GFRNONAA 46 (L) 12/28/2017 0517   GFRAA 54 (L) 12/28/2017 0517   Lipase     Component Value Date/Time   LIPASE 39 12/19/2017 1558       Studies/Results: Ct Image Guided Drainage By Percutaneous Catheter  Result Date:  12/26/2017 CLINICAL DATA:  Sigmoid diverticular abscess requiring percutaneous drainage. EXAM: CT GUIDED CATHETER DRAINAGE OF DIVERTICULAR PERITONEAL ABSCESS ANESTHESIA/SEDATION: 1.0 mg IV Versed 50 mcg IV Fentanyl Total Moderate Sedation Time:  15 minutes The patient's level of consciousness and physiologic status were continuously monitored during the procedure by Radiology nursing. PROCEDURE: The procedure, risks, benefits, and alternatives were explained to the patient. Questions regarding the procedure were encouraged and answered. The patient understands and consents to the procedure. A time out was performed prior to initiating the procedure. CT was performed in supine and oblique positions with the left side rolled up. The left lower abdominal wall was prepped with chlorhexidine in a sterile fashion, and a sterile drape was applied covering the operative field. A sterile gown and sterile gloves were used for the procedure. Local anesthesia was provided with 1% Lidocaine. Under CT guidance, an 18 gauge trocar needle was advanced to the level of a sigmoid diverticular abscess. A fluid sample was aspirated and sent for culture analysis. A guidewire was advanced through the needle. The tract was dilated and a 10 French pigtail drainage catheter advanced over the wire. Catheter was formed and position confirmed by CT. The catheter  was flushed with saline and connected to a suction bulb. COMPLICATIONS: None FINDINGS: In the flat supine position, there was no percutaneous window available to drain the sigmoid diverticular abscess due to overlying small bowel. The abscess cavity predominantly contains air and measures approximately 2.2 cm. With change in positioning and the left side rolled up, there was a percutaneous window available from the left lower abdominal wall to drain the abscess. Needle aspiration yielded bloody fluid and a sample was sent for culture analysis. A 10 French drainage catheter was formed in  the collection and after attaching the catheter to a suction bulb, CT shows no further air in the cavity and a collapsed cavity. IMPRESSION: CT-guided percutaneous catheter drainage of sigmoid diverticular abscess with placement of a 10 French drainage catheter. Bloody fluid return was sent for culture analysis. The catheter was attached to suction bulb drainage. Electronically Signed   By: Aletta Edouard M.D.   On: 12/26/2017 15:46    Anti-infectives: Anti-infectives (From admission, onward)   Start     Dose/Rate Route Frequency Ordered Stop   12/28/17 1700  piperacillin-tazobactam (ZOSYN) IVPB 3.375 g     3.375 g 12.5 mL/hr over 240 Minutes Intravenous Every 8 hours 12/28/17 1132     12/20/17 0600  piperacillin-tazobactam (ZOSYN) IVPB 3.375 g  Status:  Discontinued     3.375 g 12.5 mL/hr over 240 Minutes Intravenous Every 12 hours 12/19/17 2032 12/28/17 1132   12/19/17 1800  piperacillin-tazobactam (ZOSYN) IVPB 3.375 g     3.375 g 100 mL/hr over 30 Minutes Intravenous  Once 12/19/17 1748 12/19/17 1827       Assessment/Plan Principal Problem: Diverticulitis of large intestine with abscess Active Problems: Hypercholesterolemia Sepsis ([place]) HTN (hypertension) History of prediabetes GERD (gastroesophageal reflux disease) Anxiety Acute renal failure superimposed on stage 2 chronic kidney disease (HCC) Type II diabetes mellitus with renal manifestations ([place]) Pancreatic cyst  Diverticulitis with abscess - afebrile, VSS -improving after drain placement.  WBC stable.  Should get 5-7 days of further abx therapy to complete a 14 day course  YIR:SWNI mod diet/low fiber VTE: SCD's,lovenoxor heparinokay from our standpointafter drain placement OE:VOJJK 07/24>> Foley:none Follow up:Dr. Cornett in 4 weeks   DISPO:patient ok for DC home from our standpoint.  Script given for NS flushes.  Follow up with IR, follow up in our office, dietitian to see for  diet teaching.  Discussed already with patient.     LOS: 9 days    Henreitta Cea , [facility] 12/28/2017, 11:34 AM Pager: [phone]

## 2017-12-28 NOTE — Discharge Instructions (Signed)
High-Fiber Diet Fiber, also called dietary fiber, is a type of carbohydrate found in fruits, vegetables, whole grains, and beans. A high-fiber diet can have many health benefits. Your health care provider may recommend a high-fiber diet to help:  Prevent constipation. Fiber can make your bowel movements more regular.  Lower your cholesterol.  Relieve hemorrhoids, uncomplicated diverticulosis, or irritable bowel syndrome.  Prevent overeating as part of a weight-loss plan.  Prevent heart disease, type 2 diabetes, and certain cancers.  What is my plan? The recommended daily intake of fiber includes:  38 grams for men under age 67.  31 grams for men over age 71.  61 grams for women under age 60.  32 grams for women over age 13.  You can get the recommended daily intake of dietary fiber by eating a variety of fruits, vegetables, grains, and beans. Your health care provider may also recommend a fiber supplement if it is not possible to get enough fiber through your diet. What do I need to know about a high-fiber diet?  Fiber supplements have not been widely studied for their effectiveness, so it is better to get fiber through food sources.  Always check the fiber content on thenutrition facts label of any prepackaged food. Look for foods that contain at least 5 grams of fiber per serving.  Ask your dietitian if you have questions about specific foods that are related to your condition, especially if those foods are not listed in the following section.  Increase your daily fiber consumption gradually. Increasing your intake of dietary fiber too quickly may cause bloating, cramping, or gas.  Drink plenty of water. Water helps you to digest fiber. What foods can I eat? Grains Whole-grain breads. Multigrain cereal. Oats and oatmeal. Brown [place]. Barley. Bulgur wheat. [place]. Bran muffins. Popcorn. Rye wafer crackers. Vegetables Sweet potatoes. Spinach. Kale. Artichokes. Cabbage.  Broccoli. Green peas. Carrots. Squash. Fruits Berries. Pears. Apples. Oranges. Avocados. Prunes and raisins. Dried figs. Meats and Other Protein Sources [place], kidney, pinto, and soy beans. Split peas. Lentils. Nuts and seeds. Dairy Fiber-fortified yogurt. Beverages Fiber-fortified soy milk. Fiber-fortified orange juice. Other Fiber bars. The items listed above may not be a complete list of recommended foods or beverages. Contact your dietitian for more options. What foods are not recommended? Grains White bread. Pasta made with refined flour. White [place]. Vegetables Fried potatoes. Canned vegetables. Well-cooked vegetables. Fruits Fruit juice. Cooked, strained fruit. Meats and Other Protein Sources Fatty cuts of meat. Fried Sales executive or fried fish. Dairy Milk. Yogurt. Cream cheese. Sour cream. Beverages Soft drinks. Other Cakes and pastries. Butter and oils. The items listed above may not be a complete list of foods and beverages to avoid. Contact your dietitian for more information. What are some tips for including high-fiber foods in my diet?  Eat a wide variety of high-fiber foods.  Make sure that half of all grains consumed each day are whole grains.  Replace breads and cereals made from refined flour or white flour with whole-grain breads and cereals.  Replace white [place] with brown [place], bulgur wheat, or millet.  Start the day with a breakfast that is high in fiber, such as a cereal that contains at least 5 grams of fiber per serving.  Use beans in place of meat in soups, salads, or pasta.  Eat high-fiber snacks, such as berries, raw vegetables, nuts, or popcorn. This information is not intended to replace advice given to you by your health care provider. Make sure you discuss any  questions you have with your health care provider. Document Released: 05/15/2005 Document Revised: 10/21/2015 Document Reviewed: 10/28/2013 Elsevier Interactive Patient Education  2018  [employer].  Low-Fiber Diet Fiber is found in fruits, vegetables, and whole grains. A low-fiber diet restricts fibrous foods that are not digested in the small intestine. A diet containing about 10-15 grams of fiber per day is considered low fiber. Low-fiber diets may be used to:  Promote healing and rest the bowel during intestinal flare-ups.  Prevent blockage of a partially obstructed or narrowed gastrointestinal tract.  Reduce fecal weight and volume.  Slow the movement of feces.  You may be on a low-fiber diet as a transitional diet following surgery, after an injury (trauma), or because of a short (acute) or lifelong (chronic) illness. Your health care provider will determine the length of time you need to stay on this diet. What do I need to know about a low-fiber diet? Always check the fiber content on the packaging's Nutrition Facts label, especially on foods from the grains list. Ask your dietitian if you have questions about specific foods that are related to your condition, especially if the food is not listed below. In general, a low-fiber food will have less than 2 g of fiber. What foods can I eat? Grains All breads and crackers made with white flour. Sweet rolls, doughnuts, waffles, pancakes, [place] toast, bagels. Pretzels, Melba toast, zwieback. Well-cooked cereals, such as cornmeal, farina, or cream cereals. Dry cereals that do not contain whole grains, fruit, or nuts, such as refined corn, wheat, [place], and oat cereals. Potatoes prepared any way without skins, plain pastas and noodles, refined white Riggi. Use white flour for baking and making sauces. Use allowed list of grains for casseroles, dumplings, and puddings. Vegetables Strained tomato and vegetable juices. Fresh lettuce, cucumber, spinach. Well-cooked (no skin or pulp) or canned vegetables, such as asparagus, bean sprouts, beets, carrots, green beans, mushrooms, potatoes, pumpkin, spinach, yellow squash, tomato  sauce/puree, turnips, yams, and zucchini. Keep servings limited to  cup. Fruits All fruit juices except prune juice. Cooked or canned fruits without skin and seeds, such as applesauce, apricots, cherries, fruit cocktail, grapefruit, grapes, mandarin oranges, melons, peaches, pears, pineapple, and plums. Fresh fruits without skin, such as apricots, avocados, bananas, melons, pineapple, nectarines, and peaches. Keep servings limited to  cup or 1 piece. Meat and Other Protein Sources Ground or well-cooked tender beef, ham, veal, lamb, pork, or poultry. Eggs, plain cheese. Fish, oysters, shrimp, lobster, and other seafood. Liver, organ meats. Smooth nut butters. Dairy All milk products and alternative dairy substitutes, such as soy, [place], almond, and coconut, not containing added whole nuts, seeds, or added fruit. Beverages Decaf coffee, fruit, and vegetable juices or smoothies (small amounts, with no pulp or skins, and with fruits from allowed list), sports drinks, herbal tea. Condiments Ketchup, mustard, vinegar, cream sauce, cheese sauce, cocoa powder. Spices in moderation, such as allspice, basil, bay leaves, celery powder or leaves, cinnamon, cumin powder, curry powder, ginger, mace, marjoram, onion or garlic powder, oregano, paprika, parsley flakes, ground pepper, rosemary, sage, savory, tarragon, thyme, and turmeric. Sweets and Desserts Plain cakes and cookies, pie made with allowed fruit, pudding, custard, cream pie. Gelatin, fruit, ice, sherbet, frozen ice pops. Ice cream, ice milk without nuts. Plain hard candy, honey, jelly, molasses, syrup, sugar, chocolate syrup, gumdrops, marshmallows. Limit overall sugar intake. Fats and Oil Margarine, butter, cream, mayonnaise, salad oils, plain salad dressings made from allowed foods. Choose healthy fats such as olive oil,  canola oil, and omega-3 fatty acids (such as found in salmon or tuna) when possible. Other Bouillon, broth, or cream soups made  from allowed foods. Any strained soup. Casseroles or mixed dishes made with allowed foods. The items listed above may not be a complete list of recommended foods or beverages. Contact your dietitian for more options. What foods are not recommended? Grains All whole wheat and whole grain breads and crackers. Multigrains, rye, bran seeds, nuts, or coconut. Cereals containing whole grains, multigrains, bran, coconut, nuts, raisins. Cooked or dry oatmeal, steel-cut oats. Coarse wheat cereals, granola. Cereals advertised as high fiber. Potato skins. Whole grain pasta, wild or brown [place]. Popcorn. Coconut flour. Bran, buckwheat, corn bread, multigrains, rye, wheat germ. Vegetables Fresh, cooked or canned vegetables, such as artichokes, asparagus, beet greens, broccoli, Brussels sprouts, cabbage, celery, cauliflower, corn, eggplant, kale, legumes or beans, okra, peas, and tomatoes. Avoid large servings of any vegetables, especially raw vegetables. Fruits Fresh fruits, such as apples with or without skin, berries, cherries, figs, grapes, grapefruit, guavas, kiwis, mangoes, oranges, papayas, pears, persimmons, pineapple, and pomegranate. Prune juice and juices with pulp, stewed or dried prunes. Dried fruits, dates, raisins. Fruit seeds or skins. Avoid large servings of all fresh fruits. Meats and Other Protein Sources Tough, fibrous meats with gristle. Chunky nut butter. Cheese made with seeds, nuts, or other foods not recommended. Nuts, seeds, legumes (beans, including baked beans), dried peas, beans, lentils. Dairy Yogurt or cheese that contains nuts, seeds, or added fruit. Beverages Fruit juices with high pulp, prune juice. Caffeinated coffee and teas. Condiments Coconut, maple syrup, pickles, olives. Sweets and Desserts Desserts, cookies, or candies that contain nuts or coconut, chunky peanut butter, dried fruits. Jams, preserves with seeds, marmalade. Large amounts of sugar and sweets. Any other  dessert made with fruits from the not recommended list. Other Soups made from vegetables that are not recommended or that contain other foods not recommended. The items listed above may not be a complete list of foods and beverages to avoid. Contact your dietitian for more information. This information is not intended to replace advice given to you by your health care provider. Make sure you discuss any questions you have with your health care provider. Document Released: 11/04/2001 Document Revised: 10/21/2015 Document Reviewed: 04/07/2013 Elsevier Interactive Patient Education  2017 [employer].

## 2017-12-28 NOTE — Plan of Care (Signed)
Nutrition Education Note  RD consulted for nutrition education regarding nutrition management for diverticulosis/ Diverticulitis.   Pt eager for education and discharge home. Pt asked great questions and went into detail discussing diet recommendations with pt. Pt was very concerned regarding blood sugar control. Discussed ways on how to optimize glycemic control on a low fiber diet. MD will is discharging pt on low dose metformin.    RD provided "Fiber Restricted Nutrition Therapy" handout from the Academy of Nutrition and Dietetics. Reviewed patient's dietary recall and discussed ways for pt to meet nutrition goals over the next several weeks. Explained reasons for pt to follow a low fiber diet over the next 6-8 weeks. Reviewed low fiber foods and high fiber foods. Discussed best practice for long term management of diverticulosis is a high fiber diet and discussed ways to gradually increase fiber in the diet.   Teach back method used. Pt verbalizes understanding of information provided.   Expect very good compliance.  RD was following pt acutely during this hospitalization. See prior notes for further details. Pt discharging home today. Case discussed with RN and MD.  Narda Amber A. Jimmye Norman, RD, LDN, CDE Pager: [phone] After hours Pager: [phone]

## 2017-12-28 NOTE — Care Management Note (Signed)
Case Management Note  Patient Details  Name: [name] MRN: [id number] Date of Birth: 1929-05-10  Subjective/Objective:                    Action/Plan:   Expected Discharge Date:  12/28/17               Expected Discharge Plan:  [place]  In-House Referral:     Discharge planning Services  CM Consult  Post Acute Care Choice:  Home Health Choice offered to:  Patient  DME Arranged:  N/A DME Agency:  NA  HH Arranged:  RN, Nurse's Aide [place] Agency:  [place]  Status of Service:  Completed, signed off  If discussed at [place] of Stay Meetings, dates discussed:    Additional Comments:  Marilu Favre, RN 12/28/2017, 3:02 PM

## 2017-12-28 NOTE — Discharge Summary (Addendum)
Physician Discharge Summary  [name] [facility]:[id number] DOB: 08-14-1928 DOA: 12/19/2017  PCP: Crist Infante, MD  Admit date: 12/19/2017 Discharge date: 12/28/2017  Admitted From: Home  Disposition:  Home   Recommendations for Outpatient Follow-up:  1. Follow up with PCP in 1-2 weeks 2. Please obtain BMP/CBC in one week 3. Needs to repeat renal function.  4. Also adjust medication for diabetes depending on Blood sugar.  5. Needs to follow up with surgery and IR.  6. Needs follow up pancreatic cyst    Home Health: Yes. HH, Nurse.   Discharge Condition: stable.  CODE STATUS: full code.  Diet recommendation: Carb Modified  Brief/Interim Summary: 84 year old female, quite independent, living alone, driving and able to do all her ADLs, with history of hypertension, hyperlipidemia, diabetes, history of breast cancer who presents to the hospital with abdominal pain. Patient has been having abdominal pain for the past 2 to 3 weeks, mainly in the left lower quadrant. She was evaluated in outpatient and eventually started on ciprofloxacin and metronidazole, and on follow-up visit she had blood work which showed a leukocytosis of 15, shewas still having pain and was directed to the emergency room for further evaluation. Inthe ED she underwent a CT scan of the abdomen and pelvis which showed the presence of diverticulitis as well as a diverticular abscessand she took that for a few days. General surgery was consulted and patient was admitted to the hospital.IR could not aspirate the abscess initially, she was treated conservatively with IV antibiotics and a repeat CT scan of the abdomen and pelvis is pending on 7/30     Assessment & Plan:     Diverticulitis of large intestine with abscess   Hypercholesterolemia   HTN (hypertension)   History of prediabetes   GERD (gastroesophageal reflux disease)   Anxiety   Acute renal failure superimposed on stage 2 chronic kidney disease (HCC)    Type II diabetes mellitus with renal manifestations ([place])   Pancreatic cyst   Malnutrition of moderate degree   1-Diverticulitis large intestine and abscess;  -Treated with IV zosyn for 8 days.  -Repeated CT abdomen 7-30, showed persistent abscess, slight decreased in size.  -Surgery recommended IR consult for drainage.  -underwent drain placement for abscess 7-31.  Feels better.  -WBC stable. At 13. Plan to discharge on augmenting of r 7 days.    2- Hyperlipidemia;  Continue with home meds.   3-HTN;  Continue with Norvasc, and metoprolol   4-Anxiety;  Xanax PRN.   5-CKD stage II;  Stable.  Cr at 1, similar than on admission.  Received IV fluids.  Encourage oral intake. Needs follow up B-met.   History of prediabetes -CBG increasing 270 today.  Will start low dose metformin.  Close follow up with PCP   Pancreatic cyst -Incidental finding, will need outpatient follow-up per radiology recommendations  Sepsis was rule out.     Discharge Diagnoses:  Principal Problem:   Diverticulitis of large intestine with abscess Active Problems:   Hypercholesterolemia   Sepsis ([place])   HTN (hypertension)   History of prediabetes   GERD (gastroesophageal reflux disease)   Anxiety   Acute renal failure superimposed on stage 2 chronic kidney disease (HCC)   Type II diabetes mellitus with renal manifestations (HCC)   Pancreatic cyst   Malnutrition of moderate degree    Discharge Instructions  Discharge Instructions    Diet Carb Modified   Complete by:  As directed    Increase activity slowly  Complete by:  As directed      Allergies as of 12/28/2017   No Known Allergies     Medication List    STOP taking these medications   ADVIL PM PO   ciprofloxacin 500 MG tablet Commonly known as:  CIPRO   losartan 25 MG tablet Commonly known as:  COZAAR   metroNIDAZOLE 500 MG tablet Commonly known as:  FLAGYL     TAKE these medications   ALPRAZolam 0.25  MG tablet Commonly known as:  XANAX Take 1/2 tablet by mouth at bedtime as needed for anxiety (sleep)   amLODipine 5 MG tablet Commonly known as:  NORVASC Take 1 tablet (5 mg total) by mouth daily. Start taking on:  12/29/2017   amoxicillin-clavulanate 875-125 MG tablet Commonly known as:  AUGMENTIN Take 1 tablet by mouth 2 (two) times daily for 7 days.   aspirin EC 81 MG tablet Take 1 tablet (81 mg total) by mouth daily.   atorvastatin 20 MG tablet Commonly known as:  LIPITOR Take 20 mg by mouth daily.   Co Q-10 200 MG Caps Take 1 capsule by mouth daily.   ezetimibe 10 MG tablet Commonly known as:  ZETIA Take 10 mg by mouth daily.   METAMUCIL PO Take 1 Dose by mouth at bedtime. Mix one teaspoon with 8oz of water.   metFORMIN 500 MG tablet Commonly known as:  GLUCOPHAGE Take 1 tablet (500 mg total) by mouth 2 (two) times daily with a meal.   metoprolol succinate 25 MG 24 hr tablet Commonly known as:  TOPROL-XL TAKE 1/2 TABLET BY MOUTH DAILY   multivitamin tablet Take 1 tablet by mouth daily.   omeprazole 20 MG capsule Commonly known as:  PRILOSEC Take 1 capsule (20 mg total) by mouth daily.   raloxifene 60 MG tablet Commonly known as:  EVISTA TAKE 1 TABLET BY MOUTH ONCE DAILY What changed:    how much to take  how to take this  when to take this   saccharomyces boulardii 250 MG capsule Commonly known as:  FLORASTOR Take 1 capsule (250 mg total) by mouth 2 (two) times daily.   sodium chloride flush 0.9 % Soln Commonly known as:  NS Inject 10 mLs into the vein as needed (flush with 5cc daily).   traMADol 50 MG tablet Commonly known as:  ULTRAM Take 0.5 tablets (25 mg total) by mouth every 8 (eight) hours as needed for moderate pain.   vitamin B-12 500 MCG tablet Commonly known as:  CYANOCOBALAMIN Take 500 mcg by mouth daily.   vitamin C 500 MG tablet Commonly known as:  ASCORBIC ACID Take 500 mg by mouth daily.   Vitamin D 1000 units  capsule Take 1,000 Units by mouth daily.      Follow-up Information    Aletta Edouard, MD Follow up in 1 week(s).   Specialties:  Interventional Radiology, Radiology Why:  follw up with Dr Kathlene Cote 7-10 days; pt will hear from scheduler for time and date; call [phone] if any questions or needs Contact information: [address] [place] [address] [phone]        Erroll Luna, MD Follow up on 01/21/2018.   Specialty:  General Surgery Why:  10:50am, arrive by 10:20am for paperwork and check in Contact information: [place] [address] [phone]        Crist Infante, MD Follow up in 1 week(s).   Specialty:  Internal Medicine Contact information: [address] [address] [phone]          No Known Allergies  Consultations:  Surgery   IR    Procedures/Studies: Ct Abdomen Pelvis W Contrast  Result Date: 12/25/2017 CLINICAL DATA:  84 year old female with diverticulitis. Status post antibiotics. Subsequent encounter. EXAM: CT ABDOMEN AND PELVIS WITH CONTRAST TECHNIQUE: Multidetector CT imaging of the abdomen and pelvis was performed using the standard protocol following bolus administration of intravenous contrast. CONTRAST:  35m OMNIPAQUE IOHEXOL 300 MG/ML  SOLN COMPARISON:  12/19/2017 CT. FINDINGS: Lower chest: Prominent mitral valve calcification. Right coronary artery calcification. Heart size within normal limits. Breast prosthesis in place. No worrisome lung base abnormality. Hepatobiliary: No worrisome hepatic lesion. Prominent size gallbladder without calcified gallstone. No surrounding inflammation. No common bile duct stone. Pancreas: Scattered tiny pancreatic calcifications. Questionable tiny cystic lesion with follow-up as previously noted. Spleen: No splenic mass or enlargement. Adrenals/Urinary Tract: No obstructing stone or hydronephrosis. No worrisome renal or adrenal lesion.  Noncontrast filled imaging urinary bladder without gross abnormality. Stomach/Bowel: Abscess associated with sigmoid diverticulitis has decreased slightly in overall dimension since the prior examination currently 3 x 3.7 x 4.2 cm versus prior 3.8 x 4.1 x 4.2 cm. There has been a decrease in the amount of fluid contain within the abscess and slight increase in amount of gas. Prominent muscular hypertrophy/diverticula throughout the sigmoid colon which remains inflamed. Less notable diverticula throughout the descending colon. 2 cm lipoma within small bowel probably ilium. Moderate-size hiatal hernia. Vascular/Lymphatic: Atherosclerotic changes aorta and aortic branch vessels. No abdominal aortic aneurysm or large vessel occlusion. Small lymph nodes left pelvis probably reactive in origin. Reproductive: Post hysterectomy.  No worrisome adnexal mass. Other: No free intraperitoneal air separate from abscess. Musculoskeletal: Scoliosis lumbar spine convex left with superimposed prominent degenerative changes L1-2 through L5-S1 similar to prior exam. IMPRESSION: 1. Abscess associated with sigmoid diverticulitis has decreased slightly in overall dimension since the prior examination currently 3 x 3.7 x 4.2 cm versus prior 3.8 x 4.1 x 4.2 cm. There has been a decrease in the amount of fluid contain within the abscess and slight increase in amount of gas. 2. Remainder of findings without significant change as detailed above. 3.  Aortic Atherosclerosis (ICD10-I70.0). Electronically Signed   By: SGenia DelM.D.   On: 12/25/2017 13:08   Ct Abdomen Pelvis W Contrast  Result Date: 12/19/2017 CLINICAL DATA:  One month history of LEFT LOWER QUADRANT abdominal pain. Patient is being treated for diverticulitis, and presents with leukocytosis. Personal history of LEFT breast cancer post mastectomy in 1999. Surgical history also includes abdominal hysterectomy. [facility] [address] Results: Creatinine 1.0 mg/dL.  BUN 18.  Estimated GFR 51. EXAM: CT ABDOMEN AND PELVIS WITH CONTRAST TECHNIQUE: Multidetector CT imaging of the abdomen and pelvis was performed using the standard protocol following bolus administration of intravenous contrast. CONTRAST:  [id number] IOPAMIDOL INJECTION 76% IV. Oral contrast was not administered. COMPARISON:  10/05/2003. FINDINGS: Lower chest: Mild bronchiectasis involving the RIGHT LOWER LOBE. Visualized lung bases clear. Heart size normal. Hepatobiliary: Liver normal in size and appearance. Gallbladder normal in appearance without calcified gallstones. No biliary ductal dilation. Pancreas: Scattered pancreatic calcifications. 8 mm cystic mass arising from the proximal body of the pancreas (series 2, image 18 and coronal image 32, best seen on the coronal image). No other pancreatic masses. Spleen: Normal in size and appearance. Adrenals/Urinary Tract: Normal appearing adrenal glands. Kidneys normal in  size and appearance without focal parenchymal abnormality. No evidence of urinary tract calculi or obstruction. Normal-appearing decompressed urinary bladder. Stomach/Bowel: Moderate to large hiatal hernia, not significantly changed since the 2005 examination. Stomach otherwise normal in appearance. Normal-appearing small bowel. Opaque ingested material in the terminal ileum and cecum. Extensive sigmoid colon diverticulosis and scattered diverticula involving the distal descending colon. Acute diverticulitis involving the proximal sigmoid colon. Approximate 4 cm gas containing fluid collection adjacent to the proximal sigmoid colon. No free intraperitoneal air. Normal appearing decompressed appendix in the RIGHT UPPER pelvis. Vascular/Lymphatic: Moderate aortoiliac atherosclerosis without evidence of aneurysm. Normal-appearing portal venous and systemic venous systems. No pathologic lymphadenopathy. Reproductive: Surgically absent uterus.  No adnexal  masses. Other: None. Musculoskeletal: Severe degenerative disc disease and spondylosis at L1-2, L2-3, L3-4 and L4-5. Diffuse facet degenerative changes throughout the lumbar spine. Thoracolumbar levoscoliosis. No acute findings. IMPRESSION: 1. Acute/subacute sigmoid diverticulitis with an associated approximate 4 cm abscess adjacent to the proximal sigmoid colon. 2. 8 mm cystic mass involving the proximal body of the pancreas. Recommend follow up pre and post contrast MRI/MRCP or pancreatic protocol CT in 2 years. This recommendation follows ACR consensus guidelines: Management of Incidental Pancreatic Cysts: A White Paper of the ACR Incidental Findings Committee. J Am Coll Radiol 2119;41:740-814. 3. Moderate to large hiatal hernia. 4. RIGHT LOWER LOBE bronchiectasis. 5.  Aortic Atherosclerosis (ICD10-170.0) These results will be called to the ordering clinician or representative by the Radiologist Assistant, and communication documented in the PACS or zVision Dashboard. Electronically Signed   By: Evangeline Dakin M.D.   On: 12/19/2017 12:28   Ct Image Guided Drainage By Percutaneous Catheter  Result Date: 12/26/2017 CLINICAL DATA:  Sigmoid diverticular abscess requiring percutaneous drainage. EXAM: CT GUIDED CATHETER DRAINAGE OF DIVERTICULAR PERITONEAL ABSCESS ANESTHESIA/SEDATION: 1.0 mg IV Versed 50 mcg IV Fentanyl Total Moderate Sedation Time:  15 minutes The patient's level of consciousness and physiologic status were continuously monitored during the procedure by Radiology nursing. PROCEDURE: The procedure, risks, benefits, and alternatives were explained to the patient. Questions regarding the procedure were encouraged and answered. The patient understands and consents to the procedure. A time out was performed prior to initiating the procedure. CT was performed in supine and oblique positions with the left side rolled up. The left lower abdominal wall was prepped with chlorhexidine in a sterile fashion,  and a sterile drape was applied covering the operative field. A sterile gown and sterile gloves were used for the procedure. Local anesthesia was provided with 1% Lidocaine. Under CT guidance, an 18 gauge trocar needle was advanced to the level of a sigmoid diverticular abscess. A fluid sample was aspirated and sent for culture analysis. A guidewire was advanced through the needle. The tract was dilated and a 10 French pigtail drainage catheter advanced over the wire. Catheter was formed and position confirmed by CT. The catheter was flushed with saline and connected to a suction bulb. COMPLICATIONS: None FINDINGS: In the flat supine position, there was no percutaneous window available to drain the sigmoid diverticular abscess due to overlying small bowel. The abscess cavity predominantly contains air and measures approximately 2.2 cm. With change in positioning and the left side rolled up, there was a percutaneous window available from the left lower abdominal wall to drain the abscess. Needle aspiration yielded bloody fluid and a sample was sent for culture analysis. A 10 French drainage catheter was formed in the collection and after attaching the catheter to a suction bulb, CT shows no further air  in the cavity and a collapsed cavity. IMPRESSION: CT-guided percutaneous catheter drainage of sigmoid diverticular abscess with placement of a 10 French drainage catheter. Bloody fluid return was sent for culture analysis. The catheter was attached to suction bulb drainage. Electronically Signed   By: Aletta Edouard M.D.   On: 12/26/2017 15:46      Subjective: Feeling better, abdominal pain improved.  Had 2 loose BM , not watery   Discharge Exam: Vitals:   12/27/17 2212 12/28/17 0532  BP: 133/70 130/71  Pulse: 98 86  Resp: 19 19  Temp: 98.2 F (36.8 C) 98.2 F (36.8 C)  SpO2: 99% 98%   Vitals:   12/27/17 0513 12/27/17 1357 12/27/17 2212 12/28/17 0532  BP: 135/78 (!) 144/71 133/70 130/71  Pulse:  97 100 98 86  Resp: 17 16 19 19   Temp: 98 F (36.7 C) 98.3 F (36.8 C) 98.2 F (36.8 C) 98.2 F (36.8 C)  TempSrc: Oral Oral Oral Oral  SpO2: 98% 100% 99% 98%  Weight:      Height:        General: Pt is alert, awake, not in acute distress Cardiovascular: RRR, S1/S2 +, no rubs, no gallops Respiratory: CTA bilaterally, no wheezing, no rhonchi Abdominal: Soft, NT, ND, bowel sounds +, drain in place.  Extremities: no edema, no cyanosis    The results of significant diagnostics from this hospitalization (including imaging, microbiology, ancillary and laboratory) are listed below for reference.     Microbiology: Recent Results (from the past 240 hour(s))  Culture, blood (x 2)     Status: None   Collection Time: 12/19/17  9:20 PM  Result Value Ref Range Status   Specimen Description BLOOD RIGHT WRIST  Final   Special Requests   Final    BOTTLES DRAWN AEROBIC AND ANAEROBIC Blood Culture adequate volume   Culture   Final    NO GROWTH 5 DAYS Performed at [facility], [address] [place], [place] [address]    Report Status 12/24/2017 FINAL  Final  Culture, blood (x 2)     Status: None   Collection Time: 12/19/17 11:33 PM  Result Value Ref Range Status   Specimen Description BLOOD RIGHT WRIST  Final   Special Requests   Final    BOTTLES DRAWN AEROBIC AND ANAEROBIC Blood Culture adequate volume   Culture   Final    NO GROWTH 5 DAYS Performed at [facility], [place] [address] [place], [place] [address]    Report Status 12/25/2017 FINAL  Final  Aerobic/Anaerobic Culture (surgical/deep wound)     Status: None (Preliminary result)   Collection Time: 12/26/17  3:25 PM  Result Value Ref Range Status   Specimen Description ABSCESS  Final   Special Requests DIVERTICULAR ABSCESS  Final   Gram Stain   Final    FEW WBC PRESENT,BOTH PMN AND MONONUCLEAR NO ORGANISMS SEEN Performed at [facility], [address], [place], [place] [address]    Culture   Final     CULTURE REINCUBATED FOR BETTER GROWTH NO ANAEROBES ISOLATED; CULTURE IN PROGRESS FOR 5 DAYS    Report Status PENDING  Incomplete     Labs: BNP (last 3 results) Recent Labs    12/19/17 2053  BNP 606.0*   Basic Metabolic Panel: Recent Labs  Lab 12/23/17 0458 12/25/17 0634 12/27/17 1405 12/28/17 0517  NA 140 139 135 135  K 3.7 3.8 4.2 4.0  CL 108 105 97* 99  CO2  27 26 25 27   GLUCOSE 98 119* 177* 102*  BUN <5* 5* 10 10  CREATININE 1.00 0.88 0.95 1.05*  CALCIUM 8.3* 9.1 9.2 8.9   Liver Function Tests: No results for input(s): AST, ALT, ALKPHOS, BILITOT, PROT, ALBUMIN in the last 168 hours. No results for input(s): LIPASE, AMYLASE in the last 168 hours. No results for input(s): AMMONIA in the last 168 hours. CBC: Recent Labs  Lab 12/24/17 0654 12/25/17 0634 12/26/17 1246 12/27/17 0508 12/28/17 0517  WBC 12.5* 13.1* 10.5 13.6* 13.5*  HGB 13.0 12.2 12.4 11.6* 11.1*  HCT 40.2 38.2 38.6 36.4 34.7*  MCV 89.7 88.2 89.1 89.7 89.0  PLT 427* 389 368 363 312   Cardiac Enzymes: No results for input(s): CKTOTAL, CKMB, CKMBINDEX, TROPONINI in the last 168 hours. BNP: Invalid input(s): POCBNP CBG: Recent Labs  Lab 12/27/17 0851 12/27/17 1647 12/27/17 2214 12/28/17 0752 12/28/17 1147  GLUCAP 94 126* 126* 116* 227*   D-Dimer No results for input(s): DDIMER in the last 72 hours. Hgb A1c No results for input(s): HGBA1C in the last 72 hours. Lipid Profile No results for input(s): CHOL, HDL, LDLCALC, TRIG, CHOLHDL, LDLDIRECT in the last 72 hours. Thyroid function studies No results for input(s): TSH, T4TOTAL, T3FREE, THYROIDAB in the last 72 hours.  Invalid input(s): FREET3 Anemia work up No results for input(s): VITAMINB12, FOLATE, FERRITIN, TIBC, IRON, RETICCTPCT in the last 72 hours. Urinalysis    Component Value Date/Time   COLORURINE STRAW (A) 12/19/2017 1854   APPEARANCEUR CLEAR 12/19/2017 1854   LABSPEC 1.013 12/19/2017 1854   PHURINE 8.0 12/19/2017 1854    GLUCOSEU 50 (A) 12/19/2017 1854   HGBUR NEGATIVE 12/19/2017 1854   BILIRUBINUR NEGATIVE 12/19/2017 [place] 12/19/2017 1854   PROTEINUR NEGATIVE 12/19/2017 1854   NITRITE NEGATIVE 12/19/2017 1854   LEUKOCYTESUR SMALL (A) 12/19/2017 1854   Sepsis Labs Invalid input(s): PROCALCITONIN,  WBC,  LACTICIDVEN Microbiology Recent Results (from the past 240 hour(s))  Culture, blood (x 2)     Status: None   Collection Time: 12/19/17  9:20 PM  Result Value Ref Range Status   Specimen Description BLOOD RIGHT WRIST  Final   Special Requests   Final    BOTTLES DRAWN AEROBIC AND ANAEROBIC Blood Culture adequate volume   Culture   Final    NO GROWTH 5 DAYS Performed at [facility], [address], [place], [place] [address]    Report Status 12/24/2017 FINAL  Final  Culture, blood (x 2)     Status: None   Collection Time: 12/19/17 11:33 PM  Result Value Ref Range Status   Specimen Description BLOOD RIGHT WRIST  Final   Special Requests   Final    BOTTLES DRAWN AEROBIC AND ANAEROBIC Blood Culture adequate volume   Culture   Final    NO GROWTH 5 DAYS Performed at [facility], [place] [address], [place], [place] [address]    Report Status 12/25/2017 FINAL  Final  Aerobic/Anaerobic Culture (surgical/deep wound)     Status: None (Preliminary result)   Collection Time: 12/26/17  3:25 PM  Result Value Ref Range Status   Specimen Description ABSCESS  Final   Special Requests DIVERTICULAR ABSCESS  Final   Gram Stain   Final    FEW WBC PRESENT,BOTH PMN AND MONONUCLEAR NO ORGANISMS SEEN Performed at [facility], [address] [place],  [address]    Culture   Final    CULTURE REINCUBATED FOR  BETTER GROWTH NO ANAEROBES ISOLATED; CULTURE IN PROGRESS FOR 5 DAYS    Report Status PENDING  Incomplete     Time coordinating discharge: 35 minutes   SIGNED:   Elmarie Shiley, MD  [facility] 12/28/2017, 12:51 PM Pager   If 7PM-7AM, please  contact night-coverage [email or web address] Password [email or web address]

## 2017-12-29 DIAGNOSIS — I700 Atherosclerosis of aorta: Secondary | ICD-10-CM | POA: Diagnosis not present

## 2017-12-29 DIAGNOSIS — K5720 Diverticulitis of large intestine with perforation and abscess without bleeding: Secondary | ICD-10-CM | POA: Diagnosis not present

## 2017-12-29 DIAGNOSIS — A419 Sepsis, unspecified organism: Secondary | ICD-10-CM | POA: Diagnosis not present

## 2017-12-29 DIAGNOSIS — F418 Other specified anxiety disorders: Secondary | ICD-10-CM | POA: Diagnosis not present

## 2017-12-29 DIAGNOSIS — K862 Cyst of pancreas: Secondary | ICD-10-CM | POA: Diagnosis not present

## 2017-12-29 DIAGNOSIS — E440 Moderate protein-calorie malnutrition: Secondary | ICD-10-CM | POA: Diagnosis not present

## 2017-12-29 DIAGNOSIS — J479 Bronchiectasis, uncomplicated: Secondary | ICD-10-CM | POA: Diagnosis not present

## 2017-12-29 DIAGNOSIS — N182 Chronic kidney disease, stage 2 (mild): Secondary | ICD-10-CM | POA: Diagnosis not present

## 2017-12-29 DIAGNOSIS — A4189 Other specified sepsis: Secondary | ICD-10-CM | POA: Diagnosis not present

## 2017-12-29 DIAGNOSIS — E1122 Type 2 diabetes mellitus with diabetic chronic kidney disease: Secondary | ICD-10-CM | POA: Diagnosis not present

## 2017-12-29 DIAGNOSIS — I129 Hypertensive chronic kidney disease with stage 1 through stage 4 chronic kidney disease, or unspecified chronic kidney disease: Secondary | ICD-10-CM | POA: Diagnosis not present

## 2017-12-29 DIAGNOSIS — M810 Age-related osteoporosis without current pathological fracture: Secondary | ICD-10-CM | POA: Diagnosis not present

## 2017-12-29 DIAGNOSIS — Z48815 Encounter for surgical aftercare following surgery on the digestive system: Secondary | ICD-10-CM | POA: Diagnosis not present

## 2017-12-31 ENCOUNTER — Other Ambulatory Visit: Payer: Self-pay | Admitting: Surgery

## 2017-12-31 DIAGNOSIS — K5720 Diverticulitis of large intestine with perforation and abscess without bleeding: Secondary | ICD-10-CM

## 2017-12-31 LAB — AEROBIC/ANAEROBIC CULTURE W GRAM STAIN (SURGICAL/DEEP WOUND)

## 2017-12-31 LAB — AEROBIC/ANAEROBIC CULTURE (SURGICAL/DEEP WOUND)

## 2018-01-01 DIAGNOSIS — Z48815 Encounter for surgical aftercare following surgery on the digestive system: Secondary | ICD-10-CM | POA: Diagnosis not present

## 2018-01-01 DIAGNOSIS — A419 Sepsis, unspecified organism: Secondary | ICD-10-CM | POA: Diagnosis not present

## 2018-01-03 ENCOUNTER — Other Ambulatory Visit: Payer: Self-pay | Admitting: Surgery

## 2018-01-03 ENCOUNTER — Ambulatory Visit
Admission: RE | Admit: 2018-01-03 | Discharge: 2018-01-03 | Disposition: A | Discharge status: Home / Self Care | Payer: Medicare Other | Source: Ambulatory Visit | Attending: Surgery | Admitting: Surgery

## 2018-01-03 ENCOUNTER — Ambulatory Visit
Admission: RE | Admit: 2018-01-03 | Discharge: 2018-01-03 | Disposition: A | Discharge status: Home / Self Care | Payer: Medicare Other | Source: Ambulatory Visit | Attending: Radiology | Admitting: Radiology

## 2018-01-03 DIAGNOSIS — K5720 Diverticulitis of large intestine with perforation and abscess without bleeding: Secondary | ICD-10-CM | POA: Diagnosis not present

## 2018-01-03 DIAGNOSIS — K5730 Diverticulosis of large intestine without perforation or abscess without bleeding: Secondary | ICD-10-CM | POA: Diagnosis not present

## 2018-01-03 DIAGNOSIS — K449 Diaphragmatic hernia without obstruction or gangrene: Secondary | ICD-10-CM | POA: Diagnosis not present

## 2018-01-03 DIAGNOSIS — Z4689 Encounter for fitting and adjustment of other specified devices: Secondary | ICD-10-CM | POA: Diagnosis not present

## 2018-01-03 HISTORY — PX: IR RADIOLOGIST EVAL & MGMT: IMG5224

## 2018-01-03 MED ORDER — IOPAMIDOL (ISOVUE-300) INJECTION 61%
100.0000 mL | Freq: Once | INTRAVENOUS | Status: AC | PRN
  Administered 2018-01-03: 100 mL via INTRAVENOUS

## 2018-01-03 NOTE — Progress Notes (Signed)
Chief Complaint: F/U Drain  Referring Physician(s): M.Martin  Supervising Physician: Jacqulynn Cadet  History of Present Illness: [name] is a 84 y.o. female who presented to the ED on 12/19/2017 with abdominal pain.  She was found to have a diverticular abscess.  Percutaneous drain was placed on 12/26/2017 by Dr. Kathlene Cote (Initially there was no safe window to proceed).  Cultures grew Rare Candida Ablicans.  She is here today for CT scan and drain injection.  She is flushing the drain daily.  She has kept excellent record of output =  5-10 mL per day since 8/3.  She denies fever or abdominal pain.  Past Medical History:  Diagnosis Date  . Breast cancer ([name])   . Diverticulosis   . Hiatal hernia   . Hypercholesteremia   . Hypertension   . Osteopenia   . Osteoporosis     Past Surgical History:  Procedure Laterality Date  . ABDOMINAL HYSTERECTOMY    . MASTECTOMY  1999   LEFT    Allergies: Patient has no known allergies.  Medications: Prior to Admission medications   Medication Sig Start Date End Date Taking? Authorizing Provider  ALPRAZolam Duanne Moron) 0.25 MG tablet Take 1/2 tablet by mouth at bedtime as needed for anxiety (sleep)    [provider]  amLODipine (NORVASC) 5 MG tablet Take 1 tablet (5 mg total) by mouth daily. 12/29/17   Regalado, Belkys A, MD  amoxicillin-clavulanate (AUGMENTIN) 875-125 MG tablet Take 1 tablet by mouth 2 (two) times daily for 7 days. 12/28/17 01/04/18  Regalado, Jerald Kief A, MD  Ascorbic Acid (VITAMIN C) 500 MG tablet Take 500 mg by mouth daily.      [provider]  aspirin EC 81 MG tablet Take 1 tablet (81 mg total) by mouth daily. 10/18/16   Richardson Dopp T, PA-C  atorvastatin (LIPITOR) 20 MG tablet Take 20 mg by mouth daily. 03/02/16   [provider]  Cholecalciferol (VITAMIN D) 1000 UNITS capsule Take 1,000 Units by mouth daily.      [provider]  Coenzyme Q10 (CO Q-10) 200 MG CAPS Take  1 capsule by mouth daily.     [provider]  ezetimibe (ZETIA) 10 MG tablet Take 10 mg by mouth daily. 02/26/16   [provider]  metFORMIN (GLUCOPHAGE) 500 MG tablet Take 1 tablet (500 mg total) by mouth 2 (two) times daily with a meal. 12/28/17 12/28/18  Regalado, Belkys A, MD  metoprolol succinate (TOPROL-XL) 25 MG 24 hr tablet TAKE 1/2 TABLET BY MOUTH DAILY 10/01/17   Nahser, Wonda Cheng, MD  Multiple Vitamin (MULTIVITAMIN) tablet Take 1 tablet by mouth daily.      [provider]  omeprazole (PRILOSEC) 20 MG capsule Take 1 capsule (20 mg total) by mouth daily. 05/27/15   Darlin Coco, MD  Psyllium (METAMUCIL PO) Take 1 Dose by mouth at bedtime. Mix one teaspoon with 8oz of water.    [provider]  raloxifene (EVISTA) 60 MG tablet TAKE 1 TABLET BY MOUTH ONCE DAILY Patient taking differently: TAKE 60MG  BY MOUTH ONCE DAILY 12/23/14   Darlin Coco, MD  saccharomyces boulardii (FLORASTOR) 250 MG capsule Take 1 capsule (250 mg total) by mouth 2 (two) times daily. 12/28/17   Regalado, Belkys A, MD  sodium chloride flush (NS) 0.9 % SOLN Inject 10 mLs into the vein as needed (flush with 5cc daily). 12/28/17   Saverio Danker, PA-C  traMADol (ULTRAM) 50 MG tablet Take 0.5 tablets (25 mg  total) by mouth every 8 (eight) hours as needed for moderate pain. 12/28/17   Regalado, Belkys A, MD  vitamin B-12 (CYANOCOBALAMIN) 500 MCG tablet Take 500 mcg by mouth daily.     [provider]     Family History  Problem Relation Age of Onset  . Arthritis Father   . Stroke Father   . Hypertension Mother     Social History   Socioeconomic History  . Marital status: Single    Spouse name: Not on file  . Number of children: Not on file  . Years of education: Not on file  . Highest education level: Not on file  Occupational History  . Not on file  Social Needs  . Financial resource strain: Not on file  . Food insecurity:    Worry: Not on file    Inability: Not on  file  . Transportation needs:    Medical: Not on file    Non-medical: Not on file  Tobacco Use  . Smoking status: Never Smoker  . Smokeless tobacco: Never Used  Substance and Sexual Activity  . Alcohol use: Not on file  . Drug use: Not on file  . Sexual activity: Not on file  Lifestyle  . Physical activity:    Days per week: Not on file    Minutes per session: Not on file  . Stress: Not on file  Relationships  . Social connections:    Talks on phone: Not on file    Gets together: Not on file    Attends religious service: Not on file    Active member of club or organization: Not on file    Attends meetings of clubs or organizations: Not on file    Relationship status: Not on file  Other Topics Concern  . Not on file  Social History Narrative  . Not on file     Review of Systems: A 12 point ROS discussed and pertinent positives are indicated in the HPI above.  All other systems are negative. Review of Systems  Physical Exam  Constitutional: She appears well-developed.  HENT:  Head: Normocephalic and atraumatic.  Eyes: EOM are normal.  Pulmonary/Chest: Effort normal. No respiratory distress.  Abdominal: Soft. There is no tenderness.  Drain in place Output clear with small amount of white sediment in bulb.  Musculoskeletal: Normal range of motion.  Psychiatric: She has a normal mood and affect. Her behavior is normal. Judgment and thought content normal.  Vitals reviewed.  Imaging: Ct Abdomen Pelvis W Contrast  Result Date: 12/25/2017 CLINICAL DATA:  84 year old female with diverticulitis. Status post antibiotics. Subsequent encounter. EXAM: CT ABDOMEN AND PELVIS WITH CONTRAST TECHNIQUE: Multidetector CT imaging of the abdomen and pelvis was performed using the standard protocol following bolus administration of intravenous contrast. CONTRAST:  50mL OMNIPAQUE IOHEXOL 300 MG/ML  SOLN COMPARISON:  12/19/2017 CT. FINDINGS: Lower chest: Prominent mitral valve calcification.  Right coronary artery calcification. Heart size within normal limits. Breast prosthesis in place. No worrisome lung base abnormality. Hepatobiliary: No worrisome hepatic lesion. Prominent size gallbladder without calcified gallstone. No surrounding inflammation. No common bile duct stone. Pancreas: Scattered tiny pancreatic calcifications. Questionable tiny cystic lesion with follow-up as previously noted. Spleen: No splenic mass or enlargement. Adrenals/Urinary Tract: No obstructing stone or hydronephrosis. No worrisome renal or adrenal lesion. Noncontrast filled imaging urinary bladder without gross abnormality. Stomach/Bowel: Abscess associated with sigmoid diverticulitis has decreased slightly in overall dimension since the prior examination currently 3 x 3.7 x 4.2 cm versus  prior 3.8 x 4.1 x 4.2 cm. There has been a decrease in the amount of fluid contain within the abscess and slight increase in amount of gas. Prominent muscular hypertrophy/diverticula throughout the sigmoid colon which remains inflamed. Less notable diverticula throughout the descending colon. 2 cm lipoma within small bowel probably ilium. Moderate-size hiatal hernia. Vascular/Lymphatic: Atherosclerotic changes aorta and aortic branch vessels. No abdominal aortic aneurysm or large vessel occlusion. Small lymph nodes left pelvis probably reactive in origin. Reproductive: Post hysterectomy.  No worrisome adnexal mass. Other: No free intraperitoneal air separate from abscess. Musculoskeletal: Scoliosis lumbar spine convex left with superimposed prominent degenerative changes L1-2 through L5-S1 similar to prior exam. IMPRESSION: 1. Abscess associated with sigmoid diverticulitis has decreased slightly in overall dimension since the prior examination currently 3 x 3.7 x 4.2 cm versus prior 3.8 x 4.1 x 4.2 cm. There has been a decrease in the amount of fluid contain within the abscess and slight increase in amount of gas. 2. Remainder of findings  without significant change as detailed above. 3.  Aortic Atherosclerosis (ICD10-I70.0). Electronically Signed   By: Genia Del M.D.   On: 12/25/2017 13:08   Ct Abdomen Pelvis W Contrast  Result Date: 12/19/2017 CLINICAL DATA:  One month history of LEFT LOWER QUADRANT abdominal pain. Patient is being treated for diverticulitis, and presents with leukocytosis. Personal history of LEFT breast cancer post mastectomy in 1999. Surgical history also includes abdominal hysterectomy. Creatinine was obtained on site at [facility] at [address] Results: Creatinine 1.0 mg/dL.  BUN 18.  Estimated GFR 51. EXAM: CT ABDOMEN AND PELVIS WITH CONTRAST TECHNIQUE: Multidetector CT imaging of the abdomen and pelvis was performed using the standard protocol following bolus administration of intravenous contrast. CONTRAST:  15mL ISOVUE-370 IOPAMIDOL INJECTION 76% IV. Oral contrast was not administered. COMPARISON:  10/05/2003. FINDINGS: Lower chest: Mild bronchiectasis involving the RIGHT LOWER LOBE. Visualized lung bases clear. Heart size normal. Hepatobiliary: Liver normal in size and appearance. Gallbladder normal in appearance without calcified gallstones. No biliary ductal dilation. Pancreas: Scattered pancreatic calcifications. 8 mm cystic mass arising from the proximal body of the pancreas (series 2, image 18 and coronal image 32, best seen on the coronal image). No other pancreatic masses. Spleen: Normal in size and appearance. Adrenals/Urinary Tract: Normal appearing adrenal glands. Kidneys normal in size and appearance without focal parenchymal abnormality. No evidence of urinary tract calculi or obstruction. Normal-appearing decompressed urinary bladder. Stomach/Bowel: Moderate to large hiatal hernia, not significantly changed since the 2005 examination. Stomach otherwise normal in appearance. Normal-appearing small bowel. Opaque ingested material in the terminal ileum and cecum. Extensive sigmoid colon  diverticulosis and scattered diverticula involving the distal descending colon. Acute diverticulitis involving the proximal sigmoid colon. Approximate 4 cm gas containing fluid collection adjacent to the proximal sigmoid colon. No free intraperitoneal air. Normal appearing decompressed appendix in the RIGHT UPPER pelvis. Vascular/Lymphatic: Moderate aortoiliac atherosclerosis without evidence of aneurysm. Normal-appearing portal venous and systemic venous systems. No pathologic lymphadenopathy. Reproductive: Surgically absent uterus.  No adnexal masses. Other: None. Musculoskeletal: Severe degenerative disc disease and spondylosis at L1-2, L2-3, L3-4 and L4-5. Diffuse facet degenerative changes throughout the lumbar spine. Thoracolumbar levoscoliosis. No acute findings. IMPRESSION: 1. Acute/subacute sigmoid diverticulitis with an associated approximate 4 cm abscess adjacent to the proximal sigmoid colon. 2. 8 mm cystic mass involving the proximal body of the pancreas. Recommend follow up pre and post contrast MRI/MRCP or pancreatic protocol CT in 2 years. This recommendation follows ACR consensus guidelines: Management  of Incidental Pancreatic Cysts: A White Paper of the ACR Incidental Findings Committee. J Am Coll Radiol 3220;25:[id number]. 3. Moderate to large hiatal hernia. 4. RIGHT LOWER LOBE bronchiectasis. 5.  Aortic Atherosclerosis (ICD10-170.0) These results will be called to the ordering clinician or representative by the Radiologist Assistant, and communication documented in the PACS or zVision Dashboard. Electronically Signed   By: Evangeline Dakin M.D.   On: 12/19/2017 12:28   Ct Image Guided Drainage By Percutaneous Catheter  Result Date: 12/26/2017 CLINICAL DATA:  Sigmoid diverticular abscess requiring percutaneous drainage. EXAM: CT GUIDED CATHETER DRAINAGE OF DIVERTICULAR PERITONEAL ABSCESS ANESTHESIA/SEDATION: 1.0 mg IV Versed 50 mcg IV Fentanyl Total Moderate Sedation Time:  15 minutes The  patient's level of consciousness and physiologic status were continuously monitored during the procedure by Radiology nursing. PROCEDURE: The procedure, risks, benefits, and alternatives were explained to the patient. Questions regarding the procedure were encouraged and answered. The patient understands and consents to the procedure. A time out was performed prior to initiating the procedure. CT was performed in supine and oblique positions with the left side rolled up. The left lower abdominal wall was prepped with chlorhexidine in a sterile fashion, and a sterile drape was applied covering the operative field. A sterile gown and sterile gloves were used for the procedure. Local anesthesia was provided with 1% Lidocaine. Under CT guidance, an 18 gauge trocar needle was advanced to the level of a sigmoid diverticular abscess. A fluid sample was aspirated and sent for culture analysis. A guidewire was advanced through the needle. The tract was dilated and a 10 French pigtail drainage catheter advanced over the wire. Catheter was formed and position confirmed by CT. The catheter was flushed with saline and connected to a suction bulb. COMPLICATIONS: None FINDINGS: In the flat supine position, there was no percutaneous window available to drain the sigmoid diverticular abscess due to overlying small bowel. The abscess cavity predominantly contains air and measures approximately 2.2 cm. With change in positioning and the left side rolled up, there was a percutaneous window available from the left lower abdominal wall to drain the abscess. Needle aspiration yielded bloody fluid and a sample was sent for culture analysis. A 10 French drainage catheter was formed in the collection and after attaching the catheter to a suction bulb, CT shows no further air in the cavity and a collapsed cavity. IMPRESSION: CT-guided percutaneous catheter drainage of sigmoid diverticular abscess with placement of a 10 French drainage  catheter. Bloody fluid return was sent for culture analysis. The catheter was attached to suction bulb drainage. Electronically Signed   By: Aletta Edouard M.D.   On: 12/26/2017 15:46    Labs:  CBC: Recent Labs    12/25/17 0634 12/26/17 1246 12/27/17 0508 12/28/17 0517  WBC 13.1* 10.5 13.6* 13.5*  HGB 12.2 12.4 11.6* 11.1*  HCT 38.2 38.6 36.4 34.7*  PLT 389 368 363 312    COAGS: Recent Labs    12/19/17 2053 12/26/17 0602  INR 1.19 1.07  APTT 35  --     BMP: Recent Labs    12/23/17 0458 12/25/17 0634 12/27/17 1405 12/28/17 0517  NA 140 139 135 135  K 3.7 3.8 4.2 4.0  CL 108 105 97* 99  CO2 27 26 25 27   GLUCOSE 98 119* 177* 102*  BUN <5* 5* 10 10  CALCIUM 8.3* 9.1 9.2 8.9  CREATININE 1.00 0.88 0.95 1.05*  GFRNONAA 49* 57* 52* 46*  GFRAA 57* >60 >60 54*  LIVER FUNCTION TESTS: Recent Labs    12/19/17 1558 12/19/17 2053  BILITOT 0.4 0.3  AST 25 19  ALT 21 18  ALKPHOS 61 55  PROT 6.6 5.8*  ALBUMIN 3.2* 3.0*    TUMOR MARKERS: No results for input(s): AFPTM, CEA, CA199, CHROMGRNA in the last 8760 hours.  Assessment:  Diverticular abscess  S/P drain by Dr. Kathlene Cote on 12/26/2017.  CT scan done today showed resolution of the abscess cavity.  Drain injection shows fistula connection.  All imaging was reviewed by Dr. Laurence Ferrari.  I instructed the patient to stop flushing the drain and we have switched her over to a gravity bag instead of the bulb suction.  F/U in one month with drain injection only.  Electronically Signed: Murrell Redden PA-C 01/03/2018, 2:05 PM    Please refer to Dr. Katrinka Blazing attestation of this note for management and plan.

## 2018-01-04 DIAGNOSIS — A419 Sepsis, unspecified organism: Secondary | ICD-10-CM | POA: Diagnosis not present

## 2018-01-04 DIAGNOSIS — Z48815 Encounter for surgical aftercare following surgery on the digestive system: Secondary | ICD-10-CM | POA: Diagnosis not present

## 2018-01-07 DIAGNOSIS — F418 Other specified anxiety disorders: Secondary | ICD-10-CM | POA: Diagnosis not present

## 2018-01-07 DIAGNOSIS — I10 Essential (primary) hypertension: Secondary | ICD-10-CM | POA: Diagnosis not present

## 2018-01-07 DIAGNOSIS — K630 Abscess of intestine: Secondary | ICD-10-CM | POA: Diagnosis not present

## 2018-01-07 DIAGNOSIS — N182 Chronic kidney disease, stage 2 (mild): Secondary | ICD-10-CM | POA: Diagnosis not present

## 2018-01-07 DIAGNOSIS — K862 Cyst of pancreas: Secondary | ICD-10-CM | POA: Diagnosis not present

## 2018-01-07 DIAGNOSIS — E1169 Type 2 diabetes mellitus with other specified complication: Secondary | ICD-10-CM | POA: Diagnosis not present

## 2018-01-07 DIAGNOSIS — Z681 Body mass index (BMI) 19 or less, adult: Secondary | ICD-10-CM | POA: Diagnosis not present

## 2018-01-07 DIAGNOSIS — K5792 Diverticulitis of intestine, part unspecified, without perforation or abscess without bleeding: Secondary | ICD-10-CM | POA: Diagnosis not present

## 2018-01-07 DIAGNOSIS — E7849 Other hyperlipidemia: Secondary | ICD-10-CM | POA: Diagnosis not present

## 2018-01-09 DIAGNOSIS — A419 Sepsis, unspecified organism: Secondary | ICD-10-CM | POA: Diagnosis not present

## 2018-01-09 DIAGNOSIS — Z48815 Encounter for surgical aftercare following surgery on the digestive system: Secondary | ICD-10-CM | POA: Diagnosis not present

## 2018-01-11 DIAGNOSIS — A419 Sepsis, unspecified organism: Secondary | ICD-10-CM | POA: Diagnosis not present

## 2018-01-11 DIAGNOSIS — Z48815 Encounter for surgical aftercare following surgery on the digestive system: Secondary | ICD-10-CM | POA: Diagnosis not present

## 2018-01-21 DIAGNOSIS — K5720 Diverticulitis of large intestine with perforation and abscess without bleeding: Secondary | ICD-10-CM | POA: Diagnosis not present

## 2018-01-31 ENCOUNTER — Ambulatory Visit
Admission: RE | Admit: 2018-01-31 | Discharge: 2018-01-31 | Disposition: A | Discharge status: Home / Self Care | Payer: Medicare Other | Source: Ambulatory Visit | Attending: Surgery | Admitting: Surgery

## 2018-01-31 ENCOUNTER — Other Ambulatory Visit: Payer: Self-pay | Admitting: Surgery

## 2018-01-31 ENCOUNTER — Encounter: Payer: Self-pay | Admitting: Radiology

## 2018-01-31 DIAGNOSIS — K5720 Diverticulitis of large intestine with perforation and abscess without bleeding: Secondary | ICD-10-CM

## 2018-01-31 DIAGNOSIS — T85598A Other mechanical complication of other gastrointestinal prosthetic devices, implants and grafts, initial encounter: Secondary | ICD-10-CM | POA: Diagnosis not present

## 2018-01-31 DIAGNOSIS — K632 Fistula of intestine: Secondary | ICD-10-CM | POA: Diagnosis not present

## 2018-01-31 DIAGNOSIS — L0291 Cutaneous abscess, unspecified: Secondary | ICD-10-CM

## 2018-01-31 HISTORY — PX: IR RADIOLOGIST EVAL & MGMT: IMG5224

## 2018-01-31 NOTE — Progress Notes (Signed)
Referring Physician(s): Cornett,Thomas  History of present illness:  [name] is a 84 y.o. female who presented to the ED on 12/19/2017 with abdominal pain. She was found to have a diverticular abscess. Percutaneous drain was placed on 12/26/2017  First follow up was 01/03/17 +fistula per injection Scheduled for return 4 weeks and DC flushing  Still not flushing drain OP is minimal to none Denies fever/chills Denies N/V/D Denies pain  Scheduled today for re injection    Past Medical History:  Diagnosis Date  . Breast cancer ([place])   . Diverticulosis   . Hiatal hernia   . Hypercholesteremia   . Hypertension   . Osteopenia   . Osteoporosis     Past Surgical History:  Procedure Laterality Date  . ABDOMINAL HYSTERECTOMY    . IR RADIOLOGIST EVAL & MGMT  01/03/2018  . IR RADIOLOGIST EVAL & MGMT  01/31/2018  . MASTECTOMY  1999   LEFT    Allergies: Patient has no known allergies.  Medications: Prior to Admission medications   Medication Sig Start Date End Date Taking? Authorizing Provider  ALPRAZolam Duanne Moron) 0.25 MG tablet Take 1/2 tablet by mouth at bedtime as needed for anxiety (sleep)    [provider]  amLODipine (NORVASC) 5 MG tablet Take 1 tablet (5 mg total) by mouth daily. 12/29/17   Regalado, Belkys A, MD  Ascorbic Acid (VITAMIN C) 500 MG tablet Take 500 mg by mouth daily.      [provider]  aspirin EC 81 MG tablet Take 1 tablet (81 mg total) by mouth daily. 10/18/16   Richardson Dopp T, PA-C  atorvastatin (LIPITOR) 20 MG tablet Take 20 mg by mouth daily. 03/02/16   [provider]  Cholecalciferol (VITAMIN D) 1000 UNITS capsule Take 1,000 Units by mouth daily.      [provider]  Coenzyme Q10 (CO Q-10) 200 MG CAPS Take 1 capsule by mouth daily.     [provider]  ezetimibe (ZETIA) 10 MG tablet Take 10 mg by mouth daily. 02/26/16   [provider]  metFORMIN (GLUCOPHAGE) 500 MG tablet Take 1 tablet (500 mg  total) by mouth 2 (two) times daily with a meal. 12/28/17 12/28/18  Regalado, Belkys A, MD  metoprolol succinate (TOPROL-XL) 25 MG 24 hr tablet TAKE 1/2 TABLET BY MOUTH DAILY 10/01/17   Nahser, Wonda Cheng, MD  Multiple Vitamin (MULTIVITAMIN) tablet Take 1 tablet by mouth daily.      [provider]  omeprazole (PRILOSEC) 20 MG capsule Take 1 capsule (20 mg total) by mouth daily. 05/27/15   Darlin Coco, MD  Psyllium (METAMUCIL PO) Take 1 Dose by mouth at bedtime. Mix one teaspoon with 8oz of water.    [provider]  raloxifene (EVISTA) 60 MG tablet TAKE 1 TABLET BY MOUTH ONCE DAILY Patient taking differently: TAKE 60MG  BY MOUTH ONCE DAILY 12/23/14   Darlin Coco, MD  saccharomyces boulardii (FLORASTOR) 250 MG capsule Take 1 capsule (250 mg total) by mouth 2 (two) times daily. 12/28/17   Regalado, Belkys A, MD  sodium chloride flush (NS) 0.9 % SOLN Inject 10 mLs into the vein as needed (flush with 5cc daily). 12/28/17   Saverio Danker, PA-C  traMADol (ULTRAM) 50 MG tablet Take 0.5 tablets (25 mg total) by mouth every 8 (eight) hours as needed for moderate pain. 12/28/17   Regalado, Belkys A, MD  vitamin B-12 (CYANOCOBALAMIN) 500 MCG tablet Take 500 mcg by mouth daily.     [provider]     Family History  Problem Relation Age of Onset  . Arthritis Father   . Stroke Father   . Hypertension Mother     Social History   Socioeconomic History  . Marital status: Single    Spouse name: Not on file  . Number of children: Not on file  . Years of education: Not on file  . Highest education level: Not on file  Occupational History  . Not on file  Social Needs  . Financial resource strain: Not on file  . Food insecurity:    Worry: Not on file    Inability: Not on file  . Transportation needs:    Medical: Not on file    Non-medical: Not on file  Tobacco Use  . Smoking status: Never Smoker  . Smokeless tobacco: Never Used  Substance and Sexual Activity  . Alcohol  use: Not on file  . Drug use: Not on file  . Sexual activity: Not on file  Lifestyle  . Physical activity:    Days per week: Not on file    Minutes per session: Not on file  . Stress: Not on file  Relationships  . Social connections:    Talks on phone: Not on file    Gets together: Not on file    Attends religious service: Not on file    Active member of club or organization: Not on file    Attends meetings of clubs or organizations: Not on file    Relationship status: Not on file  Other Topics Concern  . Not on file  Social History Narrative  . Not on file     Vital Signs: BP (!) 147/60   Pulse (!) 113   Temp 97.8 F (36.6 C)   SpO2 100%   Physical Exam  Constitutional: She is oriented to person, place, and time.  Abdominal: Soft. Bowel sounds are normal. There is no tenderness.  Neurological: She is alert and oriented to person, place, and time.  Skin: Skin is warm and dry.  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 Her behavior is normal.    Imaging: Dg Sinus/fist Tube Chk-non Gi  Result Date: 01/31/2018 INDICATION: Fistula EXAM: ABSCESS DRAIN INJECTION MEDICATIONS: The patient is currently admitted to the hospital and receiving intravenous antibiotics. The antibiotics were administered within an appropriate time frame prior to the initiation of the procedure. ANESTHESIA/SEDATION: None COMPLICATIONS: None immediate. PROCEDURE: Informed written consent was obtained from the patient after a thorough discussion of the procedural risks, benefits and alternatives. All questions were addressed. Maximal Sterile Barrier Technique was utilized including caps, mask, sterile gowns, sterile gloves, sterile drape, hand hygiene and skin antiseptic. A timeout was performed prior to the initiation of the procedure. Contrast was  injected into the abscess drain and imaging was obtained. FINDINGS: The abscess cavity remains decompressed. Contrast fills a fistula as well as the adjacent sigmoid colon. The fistula has become more narrow. IMPRESSION: The study remains positive for a fistula to adjacent sigmoid colon. It has improved. Electronically Signed   By: Marybelle Killings M.D.   On: 01/31/2018 14:01   Ir Radiologist Eval & Mgmt  Result Date: 01/31/2018 Please refer to notes tab for details about interventional procedure. (Op Note)   Labs:  CBC: Recent Labs  12/25/17 0634 12/26/17 1246 12/27/17 0508 12/28/17 0517  WBC 13.1* 10.5 13.6* 13.5*  HGB 12.2 12.4 11.6* 11.1*  HCT 38.2 38.6 36.4 34.7*  PLT 389 368 363 312    COAGS: Recent Labs    12/19/17 2053 12/26/17 0602  INR 1.19 1.07  APTT 35  --     BMP: Recent Labs    12/23/17 0458 12/25/17 0634 12/27/17 1405 12/28/17 0517  NA 140 139 135 135  K 3.7 3.8 4.2 4.0  CL 108 105 97* 99  CO2 27 26 25 27   GLUCOSE 98 119* 177* 102*  BUN <5* 5* 10 10  CALCIUM 8.3* 9.1 9.2 8.9  CREATININE 1.00 0.88 0.95 1.05*  GFRNONAA 49* 57* 52* 46*  GFRAA 57* >60 >60 54*    LIVER FUNCTION TESTS: Recent Labs    12/19/17 1558 12/19/17 2053  BILITOT 0.4 0.3  AST 25 19  ALT 21 18  ALKPHOS 61 55  PROT 6.6 5.8*  ALBUMIN 3.2* 3.0*    Assessment:  Sigmoid diverticulitis +fistula to bowel 8/8 follow up Dc flushes and recheck 1 mo 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 Barbie Banner attestation of this note for management and plan.

## 2018-02-05 DIAGNOSIS — K5720 Diverticulitis of large intestine with perforation and abscess without bleeding: Secondary | ICD-10-CM | POA: Diagnosis not present

## 2018-02-21 DIAGNOSIS — E1169 Type 2 diabetes mellitus with other specified complication: Secondary | ICD-10-CM | POA: Diagnosis not present

## 2018-02-21 DIAGNOSIS — I10 Essential (primary) hypertension: Secondary | ICD-10-CM | POA: Diagnosis not present

## 2018-02-21 DIAGNOSIS — M810 Age-related osteoporosis without current pathological fracture: Secondary | ICD-10-CM | POA: Diagnosis not present

## 2018-02-21 DIAGNOSIS — R82998 Other abnormal findings in urine: Secondary | ICD-10-CM | POA: Diagnosis not present

## 2018-02-21 DIAGNOSIS — E7849 Other hyperlipidemia: Secondary | ICD-10-CM | POA: Diagnosis not present

## 2018-02-22 ENCOUNTER — Encounter (HOSPITAL_COMMUNITY): Payer: Self-pay

## 2018-02-22 ENCOUNTER — Other Ambulatory Visit (HOSPITAL_COMMUNITY): Payer: Self-pay | Admitting: Radiology

## 2018-02-22 ENCOUNTER — Ambulatory Visit (HOSPITAL_COMMUNITY)
Admission: RE | Admit: 2018-02-22 | Discharge: 2018-02-22 | Disposition: A | Discharge status: Home / Self Care | Payer: Medicare Other | Source: Ambulatory Visit | Attending: Radiology | Admitting: Radiology

## 2018-02-22 ENCOUNTER — Telehealth: Payer: Self-pay | Admitting: Student

## 2018-02-22 DIAGNOSIS — K5792 Diverticulitis of intestine, part unspecified, without perforation or abscess without bleeding: Secondary | ICD-10-CM

## 2018-02-22 NOTE — Progress Notes (Addendum)
Patient ID: [name], female   DOB: [birth year], 84 y.o.   MRN: [id number] Pt's LLQ drain examined today; site appears ok with only minimal erythema around insertion site; neosporin applied to area; no sig leakage of fluid noted; minimal amt blood-tinged fluid in gravity bag; denies worsening abd pain, fever,N/V; new statlock device applied and pt given new shortened gravity bag; she is scheduled to return to IR clinic on 10/2 for f/u drain injection- has known fistula to bowel; flushing of drain not advised. Pt given orange output card with contact numbers for any further questions

## 2018-02-22 NOTE — Telephone Encounter (Signed)
Patient called to report that yesterday she unable to get output from her drain.  States she was seen in the clinic yesterday with bag change and flush.  Today she has leakage around the drain.  Will arrange for her to be seen at [facility] or [facility] for site check.  Patient understands she will receive a call back.   Brynda Greathouse, MS RD PA-C

## 2018-02-26 DIAGNOSIS — Z1212 Encounter for screening for malignant neoplasm of rectum: Secondary | ICD-10-CM | POA: Diagnosis not present

## 2018-02-27 ENCOUNTER — Other Ambulatory Visit: Payer: Self-pay | Admitting: Surgery

## 2018-02-27 ENCOUNTER — Encounter: Payer: Self-pay | Admitting: Radiology

## 2018-02-27 ENCOUNTER — Ambulatory Visit
Admission: RE | Admit: 2018-02-27 | Discharge: 2018-02-27 | Disposition: A | Discharge status: Home / Self Care | Payer: Medicare Other | Source: Ambulatory Visit | Attending: Surgery | Admitting: Surgery

## 2018-02-27 DIAGNOSIS — L0291 Cutaneous abscess, unspecified: Secondary | ICD-10-CM | POA: Diagnosis not present

## 2018-02-27 DIAGNOSIS — K632 Fistula of intestine: Secondary | ICD-10-CM | POA: Diagnosis not present

## 2018-02-27 HISTORY — PX: IR RADIOLOGIST EVAL & MGMT: IMG5224

## 2018-02-27 NOTE — Progress Notes (Signed)
Referring Physician(s): Cornett,Thomas  Chief Complaint: The patient is seen in follow up today s/p LLQ drain placement 12/26/17  History of present illness: [name] is an 84 year old female known to IR from diverticular abscess s/p drain placement 12/26/17.  Patient was seen in follow-up 01/03/18 at which time her drain injection was positive for fistula.  Fistula was still present at follow-up 01/31/18.  She was last assessed 9/27 at [facility] due to decreased output with leakage.  Drain was found to be intact, a new bag was applied and she was instructed to keep follow-up appointment scheduled today.  Patient presents today in her usual state of health.  She reports improvement since being seen 9/27.  No further leakage. Minimal output.  She denies fever, chills, abdominal pain, nausea, vomiting. She has been eating and drinking with tolerance.  She has scheduled follow-up with surgeon 10/4.   Past Medical History:  Diagnosis Date  . Breast cancer ([place])   . Diverticulosis   . Hiatal hernia   . Hypercholesteremia   . Hypertension   . Osteopenia   . Osteoporosis     Past Surgical History:  Procedure Laterality Date  . ABDOMINAL HYSTERECTOMY    . IR RADIOLOGIST EVAL & MGMT  01/03/2018  . IR RADIOLOGIST EVAL & MGMT  01/31/2018  . MASTECTOMY  1999   LEFT    Allergies: Patient has no known allergies.  Medications: Prior to Admission medications   Medication Sig Start Date End Date Taking? Authorizing Provider  ALPRAZolam Duanne Moron) 0.25 MG tablet Take 1/2 tablet by mouth at bedtime as needed for anxiety (sleep)    [provider]  amLODipine (NORVASC) 5 MG tablet Take 1 tablet (5 mg total) by mouth daily. 12/29/17   Regalado, Belkys A, MD  Ascorbic Acid (VITAMIN C) 500 MG tablet Take 500 mg by mouth daily.      [provider]  aspirin EC 81 MG tablet Take 1 tablet (81 mg total) by mouth daily. 10/18/16   Richardson Dopp T, PA-C  atorvastatin (LIPITOR) 20 MG tablet Take 20 mg  by mouth daily. 03/02/16   [provider]  Cholecalciferol (VITAMIN D) 1000 UNITS capsule Take 1,000 Units by mouth daily.      [provider]  Coenzyme Q10 (CO Q-10) 200 MG CAPS Take 1 capsule by mouth daily.     [provider]  ezetimibe (ZETIA) 10 MG tablet Take 10 mg by mouth daily. 02/26/16   [provider]  metFORMIN (GLUCOPHAGE) 500 MG tablet Take 1 tablet (500 mg total) by mouth 2 (two) times daily with a meal. 12/28/17 12/28/18  Regalado, Belkys A, MD  metoprolol succinate (TOPROL-XL) 25 MG 24 hr tablet TAKE 1/2 TABLET BY MOUTH DAILY 10/01/17   Nahser, Wonda Cheng, MD  Multiple Vitamin (MULTIVITAMIN) tablet Take 1 tablet by mouth daily.      [provider]  omeprazole (PRILOSEC) 20 MG capsule Take 1 capsule (20 mg total) by mouth daily. 05/27/15   Darlin Coco, MD  Psyllium (METAMUCIL PO) Take 1 Dose by mouth at bedtime. Mix one teaspoon with 8oz of water.    [provider]  raloxifene (EVISTA) 60 MG tablet TAKE 1 TABLET BY MOUTH ONCE DAILY Patient taking differently: TAKE 60MG  BY MOUTH ONCE DAILY 12/23/14   Darlin Coco, MD  saccharomyces boulardii (FLORASTOR) 250 MG capsule Take 1 capsule (250 mg total) by mouth 2 (two) times daily. 12/28/17   Regalado, Cassie Freer, MD  sodium chloride  flush (NS) 0.9 % SOLN Inject 10 mLs into the vein as needed (flush with 5cc daily). 12/28/17   Saverio Danker, PA-C  traMADol (ULTRAM) 50 MG tablet Take 0.5 tablets (25 mg total) by mouth every 8 (eight) hours as needed for moderate pain. 12/28/17   Regalado, Belkys A, MD  vitamin B-12 (CYANOCOBALAMIN) 500 MCG tablet Take 500 mcg by mouth daily.     [provider]     Family History  Problem Relation Age of Onset  . Arthritis Father   . Stroke Father   . Hypertension Mother     Social History   Socioeconomic History  . Marital status: Single    Spouse name: Not on file  . Number of children: Not on file  . Years of education: Not on  file  . Highest education level: Not on file  Occupational History  . Not on file  Social Needs  . Financial resource strain: Not on file  . Food insecurity:    Worry: Not on file    Inability: Not on file  . Transportation needs:    Medical: Not on file    Non-medical: Not on file  Tobacco Use  . Smoking status: Never Smoker  . Smokeless tobacco: Never Used  Substance and Sexual Activity  . Alcohol use: Not on file  . Drug use: Not on file  . Sexual activity: Not on file  Lifestyle  . Physical activity:    Days per week: Not on file    Minutes per session: Not on file  . Stress: Not on file  Relationships  . Social connections:    Talks on phone: Not on file    Gets together: Not on file    Attends religious service: Not on file    Active member of club or organization: Not on file    Attends meetings of clubs or organizations: Not on file    Relationship status: Not on file  Other Topics Concern  . Not on file  Social History Narrative  . Not on file     Vital Signs: There were no vitals taken for this visit.  Physical Exam  NAD, alert.  Abdomen: soft, non-distended, non-tender.  Drain in LLQ intact.  Insertion site c/d/i. Scant amount of drainage in gravity bag- states she emptied 2 mL earlier today.  Imaging: No results found.  Labs:  CBC: Recent Labs    12/25/17 0634 12/26/17 1246 12/27/17 0508 12/28/17 0517  WBC 13.1* 10.5 13.6* 13.5*  HGB 12.2 12.4 11.6* 11.1*  HCT 38.2 38.6 36.4 34.7*  PLT 389 368 363 312    COAGS: Recent Labs    12/19/17 2053 12/26/17 0602  INR 1.19 1.07  APTT 35  --     BMP: Recent Labs    12/23/17 0458 12/25/17 0634 12/27/17 1405 12/28/17 0517  NA 140 139 135 135  K 3.7 3.8 4.2 4.0  CL 108 105 97* 99  CO2 27 26 25 27   GLUCOSE 98 119* 177* 102*  BUN <5* 5* 10 10  CALCIUM 8.3* 9.1 9.2 8.9  CREATININE 1.00 0.88 0.95 1.05*  GFRNONAA 49* 57* 52* 46*  GFRAA 57* >60 >60 54*    LIVER FUNCTION  TESTS: Recent Labs    12/19/17 1558 12/19/17 2053  BILITOT 0.4 0.3  AST 25 19  ALT 21 18  ALKPHOS 61 55  PROT 6.6 5.8*  ALBUMIN 3.2* 3.0*    Assessment: Sigmoid diverticulitis s/p drain placement 12/26/17 Positive  fistula since 01/03/18.  Repeat injection today demonstrates fistula remains.  Reviewed imaging with Dr. Barbie Banner who notes may be slightly improved, but still present.  Patient to continue with no flushes and gravity drainage.  Instructed to change dressing as needed to keep clean and dry.  She has surgical follow-up 10/4.  Encouraged to keep appointment.  Repeat injection in 1 month.   Signed: Docia Barrier, PA 02/27/2018, 1:22 PM   Please refer to Dr. Barbie Banner attestation of this note for management and plan.

## 2018-02-28 ENCOUNTER — Other Ambulatory Visit: Payer: Medicare Other

## 2018-02-28 DIAGNOSIS — Z0000 Encounter for general adult medical examination without abnormal findings: Secondary | ICD-10-CM | POA: Diagnosis not present

## 2018-02-28 DIAGNOSIS — K630 Abscess of intestine: Secondary | ICD-10-CM | POA: Diagnosis not present

## 2018-02-28 DIAGNOSIS — K449 Diaphragmatic hernia without obstruction or gangrene: Secondary | ICD-10-CM | POA: Diagnosis not present

## 2018-02-28 DIAGNOSIS — Z1389 Encounter for screening for other disorder: Secondary | ICD-10-CM | POA: Diagnosis not present

## 2018-02-28 DIAGNOSIS — I700 Atherosclerosis of aorta: Secondary | ICD-10-CM | POA: Diagnosis not present

## 2018-02-28 DIAGNOSIS — E1169 Type 2 diabetes mellitus with other specified complication: Secondary | ICD-10-CM | POA: Diagnosis not present

## 2018-02-28 DIAGNOSIS — M47896 Other spondylosis, lumbar region: Secondary | ICD-10-CM | POA: Diagnosis not present

## 2018-02-28 DIAGNOSIS — K862 Cyst of pancreas: Secondary | ICD-10-CM | POA: Diagnosis not present

## 2018-02-28 DIAGNOSIS — I10 Essential (primary) hypertension: Secondary | ICD-10-CM | POA: Diagnosis not present

## 2018-02-28 DIAGNOSIS — Z23 Encounter for immunization: Secondary | ICD-10-CM | POA: Diagnosis not present

## 2018-02-28 DIAGNOSIS — N182 Chronic kidney disease, stage 2 (mild): Secondary | ICD-10-CM | POA: Diagnosis not present

## 2018-02-28 DIAGNOSIS — Z681 Body mass index (BMI) 19 or less, adult: Secondary | ICD-10-CM | POA: Diagnosis not present

## 2018-02-28 DIAGNOSIS — C50919 Malignant neoplasm of unspecified site of unspecified female breast: Secondary | ICD-10-CM | POA: Diagnosis not present

## 2018-03-12 ENCOUNTER — Other Ambulatory Visit (HOSPITAL_COMMUNITY): Payer: Self-pay | Admitting: *Deleted

## 2018-03-13 ENCOUNTER — Ambulatory Visit (HOSPITAL_COMMUNITY)
Admission: RE | Admit: 2018-03-13 | Discharge: 2018-03-13 | Disposition: A | Discharge status: Home / Self Care | Payer: Medicare Other | Source: Ambulatory Visit | Attending: Internal Medicine | Admitting: Internal Medicine

## 2018-03-13 MED ORDER — DENOSUMAB 60 MG/ML ~~LOC~~ SOSY: 60.0000 mg | PREFILLED_SYRINGE | Freq: Once | SUBCUTANEOUS | Status: DC

## 2018-03-13 MED ORDER — DENOSUMAB 60 MG/ML ~~LOC~~ SOSY
PREFILLED_SYRINGE | SUBCUTANEOUS | Status: AC
  Filled 2018-03-13: qty 1

## 2018-03-13 NOTE — Discharge Instructions (Signed)
Denosumab injection °What is this medicine? °DENOSUMAB (den oh sue mab) slows bone breakdown. Prolia is used to treat osteoporosis in women after menopause and in men. Xgeva is used to treat a high calcium level due to cancer and to prevent bone fractures and other bone problems caused by multiple myeloma or cancer bone metastases. Xgeva is also used to treat giant cell tumor of the bone. °This medicine may be used for other purposes; ask your health care provider or pharmacist if you have questions. °COMMON BRAND NAME(S): Prolia, XGEVA °What should I tell my health care provider before I take this medicine? °They need to know if you have any of these conditions: °-dental disease °-having surgery or tooth extraction °-infection °-kidney disease °-low levels of calcium or Vitamin D in the blood °-malnutrition °-on hemodialysis °-skin conditions or sensitivity °-thyroid or parathyroid disease °-an unusual reaction to denosumab, other medicines, foods, dyes, or preservatives °-pregnant or trying to get pregnant °-breast-feeding °How should I use this medicine? °This medicine is for injection under the skin. It is given by a health care professional in a hospital or clinic setting. °If you are getting Prolia, a special MedGuide will be given to you by the pharmacist with each prescription and refill. Be sure to read this information carefully each time. °For Prolia, talk to your pediatrician regarding the use of this medicine in children. Special care may be needed. For Xgeva, talk to your pediatrician regarding the use of this medicine in children. While this drug may be prescribed for children as young as 13 years for selected conditions, precautions do apply. °Overdosage: If you think you have taken too much of this medicine contact a poison control center or emergency room at once. °NOTE: This medicine is only for you. Do not share this medicine with others. °What if I miss a dose? °It is important not to miss your  dose. Call your doctor or health care professional if you are unable to keep an appointment. °What may interact with this medicine? °Do not take this medicine with any of the following medications: °-other medicines containing denosumab °This medicine may also interact with the following medications: °-medicines that lower your chance of fighting infection °-steroid medicines like prednisone or cortisone °This list may not describe all possible interactions. Give your health care provider a list of all the medicines, herbs, non-prescription drugs, or dietary supplements you use. Also tell them if you smoke, drink alcohol, or use illegal drugs. Some items may interact with your medicine. °What should I watch for while using this medicine? °Visit your doctor or health care professional for regular checks on your progress. Your doctor or health care professional may order blood tests and other tests to see how you are doing. °Call your doctor or health care professional for advice if you get a fever, chills or sore throat, or other symptoms of a cold or flu. Do not treat yourself. This drug may decrease your body's ability to fight infection. Try to avoid being around people who are sick. °You should make sure you get enough calcium and vitamin D while you are taking this medicine, unless your doctor tells you not to. Discuss the foods you eat and the vitamins you take with your health care professional. °See your dentist regularly. Brush and floss your teeth as directed. Before you have any dental work done, tell your dentist you are receiving this medicine. °Do not become pregnant while taking this medicine or for 5 months after stopping   it. Talk with your doctor or health care professional about your birth control options while taking this medicine. Women should inform their doctor if they wish to become pregnant or think they might be pregnant. There is a potential for serious side effects to an unborn child. Talk  to your health care professional or pharmacist for more information. What side effects may I notice from receiving this medicine? Side effects that you should report to your doctor or health care professional as soon as possible: -allergic reactions like skin rash, itching or hives, swelling of the face, lips, or tongue -bone pain -breathing problems -dizziness -jaw pain, especially after dental work -redness, blistering, peeling of the skin -signs and symptoms of infection like fever or chills; cough; sore throat; pain or trouble passing urine -signs of low calcium like fast heartbeat, muscle cramps or muscle pain; pain, tingling, numbness in the hands or feet; seizures -unusual bleeding or bruising -unusually weak or tired Side effects that usually do not require medical attention (report to your doctor or health care professional if they continue or are bothersome): -constipation -diarrhea -headache -joint pain -loss of appetite -muscle pain -runny nose -tiredness -upset stomach This list may not describe all possible side effects. Call your doctor for medical advice about side effects. You may report side effects to FDA at 1-800-FDA-1088. Where should I keep my medicine? This medicine is only given in a clinic, doctor's office, or other health care setting and will not be stored at home. NOTE: This sheet is a summary. It may not cover all possible information. If you have questions about this medicine, talk to your doctor, pharmacist, or health care provider.  2018 [employer]/Gold Standard (2016-06-06 19:17:21)

## 2018-03-20 ENCOUNTER — Other Ambulatory Visit: Payer: Self-pay | Admitting: Surgery

## 2018-03-20 ENCOUNTER — Encounter: Payer: Self-pay | Admitting: Radiology

## 2018-03-20 ENCOUNTER — Ambulatory Visit
Admission: RE | Admit: 2018-03-20 | Discharge: 2018-03-20 | Disposition: A | Discharge status: Home / Self Care | Payer: Medicare Other | Source: Ambulatory Visit | Attending: Surgery | Admitting: Surgery

## 2018-03-20 DIAGNOSIS — L0291 Cutaneous abscess, unspecified: Secondary | ICD-10-CM

## 2018-03-20 HISTORY — PX: IR RADIOLOGIST EVAL & MGMT: IMG5224

## 2018-03-20 NOTE — Progress Notes (Signed)
Patient ID: [name], female   DOB: 1929-04-14, 84 y.o.   MRN: [id number]         Chief Complaint: Percutaneous drainage catheter evaluation and management  Referring Physician(s): Cornett,Thomas  History of Present Illness: [name] is a 84 y.o. female with past medical history significant for breast cancer, hiatal hernia, hypercholesteremia hypertension osteoporosis who underwent placement of a percutaneous drainage catheter on 12/26/2017 for diverticular abscess.  Postprocedural CT scan performed 01/03/2018 demonstrates resolution of the diverticular abscess however several previous drainage catheter injections all of which demonstrate a persistent fistulous connection to the adjacent sigmoid colon.  Patient returns today to the interventional radiology drain clinic for venous catheter evaluation and management.  The patient is not flushing the percutaneous catheter.  The patient reports little to no output from the percutaneous drainage catheter for the past several days.  Past Medical History:  Diagnosis Date  . Breast cancer ([place])   . Diverticulosis   . Hiatal hernia   . Hypercholesteremia   . Hypertension   . Osteopenia   . Osteoporosis     Past Surgical History:  Procedure Laterality Date  . ABDOMINAL HYSTERECTOMY    . IR RADIOLOGIST EVAL & MGMT  01/03/2018  . IR RADIOLOGIST EVAL & MGMT  01/31/2018  . IR RADIOLOGIST EVAL & MGMT  02/27/2018  . IR RADIOLOGIST EVAL & MGMT  03/20/2018  . MASTECTOMY  1999   LEFT    Allergies: Patient has no known allergies.  Medications: Prior to Admission medications   Medication Sig Start Date End Date Taking? Authorizing Provider  ALPRAZolam Duanne Moron) 0.25 MG tablet Take 1/2 tablet by mouth at bedtime as needed for anxiety (sleep)    [provider]  amLODipine (NORVASC) 5 MG tablet Take 1 tablet (5 mg total) by mouth daily. 12/29/17   Regalado, Belkys A, MD  Ascorbic Acid (VITAMIN C) 500 MG tablet Take 500 mg by mouth  daily.      [provider]  aspirin EC 81 MG tablet Take 1 tablet (81 mg total) by mouth daily. 10/18/16   Richardson Dopp T, PA-C  atorvastatin (LIPITOR) 20 MG tablet Take 20 mg by mouth daily. 03/02/16   [provider]  Cholecalciferol (VITAMIN D) 1000 UNITS capsule Take 1,000 Units by mouth daily.      [provider]  Coenzyme Q10 (CO Q-10) 200 MG CAPS Take 1 capsule by mouth daily.     [provider]  ezetimibe (ZETIA) 10 MG tablet Take 10 mg by mouth daily. 02/26/16   [provider]  metFORMIN (GLUCOPHAGE) 500 MG tablet Take 1 tablet (500 mg total) by mouth 2 (two) times daily with a meal. 12/28/17 12/28/18  Regalado, Belkys A, MD  metoprolol succinate (TOPROL-XL) 25 MG 24 hr tablet TAKE 1/2 TABLET BY MOUTH DAILY 10/01/17   Nahser, Wonda Cheng, MD  Multiple Vitamin (MULTIVITAMIN) tablet Take 1 tablet by mouth daily.      [provider]  omeprazole (PRILOSEC) 20 MG capsule Take 1 capsule (20 mg total) by mouth daily. 05/27/15   Darlin Coco, MD  Psyllium (METAMUCIL PO) Take 1 Dose by mouth at bedtime. Mix one teaspoon with 8oz of water.    [provider]  raloxifene (EVISTA) 60 MG tablet TAKE 1 TABLET BY MOUTH ONCE DAILY Patient taking differently: TAKE 60MG  BY MOUTH ONCE DAILY 12/23/14   Darlin Coco, MD  saccharomyces boulardii (FLORASTOR) 250 MG capsule Take 1 capsule (250 mg total) by mouth 2 (  two) times daily. 12/28/17   Regalado, Belkys A, MD  sodium chloride flush (NS) 0.9 % SOLN Inject 10 mLs into the vein as needed (flush with 5cc daily). 12/28/17   Saverio Danker, PA-C  traMADol (ULTRAM) 50 MG tablet Take 0.5 tablets (25 mg total) by mouth every 8 (eight) hours as needed for moderate pain. 12/28/17   Regalado, Belkys A, MD  vitamin B-12 (CYANOCOBALAMIN) 500 MCG tablet Take 500 mcg by mouth daily.     [provider]     Family History  Problem Relation Age of Onset  . Arthritis Father   . Stroke Father   .  Hypertension Mother     Social History   Socioeconomic History  . Marital status: Single    Spouse name: Not on file  . Number of children: Not on file  . Years of education: Not on file  . Highest education level: Not on file  Occupational History  . Not on file  Social Needs  . Financial resource strain: Not on file  . Food insecurity:    Worry: Not on file    Inability: Not on file  . Transportation needs:    Medical: Not on file    Non-medical: Not on file  Tobacco Use  . Smoking status: Never Smoker  . Smokeless tobacco: Never Used  Substance and Sexual Activity  . Alcohol use: Not on file  . Drug use: Not on file  . Sexual activity: Not on file  Lifestyle  . Physical activity:    Days per week: Not on file    Minutes per session: Not on file  . Stress: Not on file  Relationships  . Social connections:    Talks on phone: Not on file    Gets together: Not on file    Attends religious service: Not on file    Active member of club or organization: Not on file    Attends meetings of clubs or organizations: Not on file    Relationship status: Not on file  Other Topics Concern  . Not on file  Social History Narrative  . Not on file    ECOG Status: 1 - Symptomatic but completely ambulatory  Review of Systems: A 12 point ROS discussed and pertinent positives are indicated in the HPI above.  All other systems are negative.  Review of Systems  Constitutional: Negative for fever.    Vital Signs: BP 139/63   Pulse 100   Temp 97.7 F (36.5 C) (Oral)   SpO2 100%   Physical Exam  Abdominal:    Location of the patient's percutaneous drainage catheter.       Imaging: Dg Sinus/fist Tube Chk-non Gi  Result Date: 03/20/2018 CLINICAL DATA:  History of diverticulitis followed by development of diverticular abscess post placement of percutaneous drainage catheter on 12/26/2017. Patient has undergone several previous drain injections all of which demonstrated  persistent fistulous connection to the adjacent sigmoid colon. Patient returns today to the interventional radiology drain Clinic for drainage catheter evaluation and management. The patient is not flushing the percutaneous drainage catheter. Patient reports little to no output from the percutaneous drainage catheter for the past several days. EXAM: ABSCESS INJECTION COMPARISON:  Fluoroscopic guided drain injection - 02/27/2018; 01/31/2018; 01/03/2018; CT-guided percutaneous drainage catheter placement - 12/26/2017; CT abdomen pelvis - 01/03/2018; 12/25/2017 CONTRAST:  10 cc Omnipaque 300-administered via the existing percutaneous drainage catheter FLUOROSCOPY TIME:  30 seconds (4.1 mGy) TECHNIQUE: The patient was positioned supine on the  fluoroscopy table. A preprocedural spot fluoroscopic image was obtained of the left lower abdomen and existing percutaneous drainage catheter. Multiple spot fluoroscopic and radiographic images were obtained following the injection of a small amount of contrast via the existing percutaneous drainage catheter. Images reviewed and discussed with the patient. Drainage catheter was flushed with a small amount of saline and reconnected to a gravity bag. A dressing was placed. The patient tolerated the procedure well without immediate postprocedural complication. FINDINGS: Preprocedural spot fluoroscopic image demonstrates unchanged positioning of the percutaneous drainage catheter with end coiled and locked over the left hemipelvis. Subsequent contrast injection demonstrates opacification of the decompressed abscess cavity with tiny Pain communication between the end of the percutaneous drainage catheter and the adjacent sigmoid colon, grossly unchanged compared to the 01/31/2018 examination. There is reflux of contrast along the catheter tract to the entrance at the skin surface. IMPRESSION: Persistent tiny fistulous communication between the decompressed abscess cavity and the  adjacent sigmoid colon, grossly unchanged compared to the 01/31/2018 examination. PLAN: - The patient was encouraged to maintain follow-up appoint with Dr. Brantley Stage scheduled for tomorrow. - Given persistence the fistula since the 01/31/2018 examination, I am skeptical that the fistula will resolve with continued percutaneous management however as the patient wishes to avoid a surgical procedure, we will tentatively schedule the patient for a repeat drain injection in 2 weeks if definitive operative management is not arranged in the interval. Electronically Signed   By: Sandi Mariscal M.D.   On: 03/20/2018 11:37   Dg Sinus/fist Tube Chk-non Gi  Result Date: 02/27/2018 INDICATION: Fistula EXAM: Left lower quadrant abscess drain injection MEDICATIONS: The patient is currently admitted to the hospital and receiving intravenous antibiotics. The antibiotics were administered within an appropriate time frame prior to the initiation of the procedure. ANESTHESIA/SEDATION: None COMPLICATIONS: None immediate. PROCEDURE: Informed written consent was obtained from the patient after a thorough discussion of the procedural risks, benefits and alternatives. All questions were addressed. Maximal Sterile Barrier Technique was utilized including caps, mask, sterile gowns, sterile gloves, sterile drape, hand hygiene and skin antiseptic. A timeout was performed prior to the initiation of the procedure. Contrast was injected into the left lower quadrant drain. FINDINGS: The left lower quadrant abscess cavity is decompressed. There is a stable fistula to adjacent sigmoid colon. IMPRESSION: Abscess cavity remains decompressed. 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 Electronically Signed   By: Marybelle Killings M.D.   On: 02/27/2018 15:26   Ir Radiologist Eval & Mgmt  Result Date: 03/20/2018 Please refer to notes tab for details about interventional procedure. (Op Note)  Ir Radiologist Eval & Mgmt  Result Date:  02/27/2018 Please refer to notes tab for details about interventional procedure. (Op Note)   Labs:  CBC: Recent Labs    12/25/17 0634 12/26/17 1246 12/27/17 0508 12/28/17 0517  WBC 13.1* 10.5 13.6* 13.5*  HGB 12.2 12.4 11.6* 11.1*  HCT 38.2 38.6 36.4 34.7*  PLT 389 368 363 312    COAGS: Recent Labs    12/19/17 2053 12/26/17 0602  INR 1.19 1.07  APTT 35  --     BMP: Recent Labs    12/23/17 0458 12/25/17 0634 12/27/17 1405 12/28/17 0517  NA 140 139 135 135  K 3.7 3.8 4.2 4.0  CL 108 105 97* 99  CO2 27 26 25 27   GLUCOSE 98 119* 177* 102*  BUN <5* 5* 10 10  CALCIUM 8.3* 9.1 9.2 8.9  CREATININE 1.00 0.88 0.95 1.05*  GFRNONAA  49* 57* 52* 46*  GFRAA 57* >60 >60 54*    LIVER FUNCTION TESTS: Recent Labs    12/19/17 1558 12/19/17 2053  BILITOT 0.4 0.3  AST 25 19  ALT 21 18  ALKPHOS 61 55  PROT 6.6 5.8*  ALBUMIN 3.2* 3.0*    TUMOR MARKERS: No results for input(s): AFPTM, CEA, CA199, CHROMGRNA in the last 8760 hours.  Assessment and Plan:  CATALEIA GADE is a 84 y.o. female with past medical history significant for breast cancer, hiatal hernia, hypercholesteremia hypertension osteoporosis who underwent placement of a percutaneous drainage catheter on 12/26/2017 for diverticular abscess.  Postprocedural CT scan performed 01/03/2018 demonstrates resolution of the diverticular abscess however several previous drainage catheter injections all of which demonstrate a persistent fistulous connection to the adjacent sigmoid colon.  Unfortunately today's drainage catheter injection demonstrates persistent fistulous connection between the decompressed abscess cavity and the adjacent sigmoid colon, similar to the 01/31/2018 examination.  As such, the drainage catheter was flushed with small amount of saline and reconnected to a gravity bag.  Plan: - The patient was encouraged to keep her follow-up appointment scheduled with Dr. Brantley Stage for tomorrow. - Given persistence of  the fistula since the 9//2019 examination, I am skeptical that the fistula will resolve with continued percutaneous management, however as the patient wishes to avoid a surgical procedure, we will tentatively schedule the patient for a repeat drainage catheter injection in 2 weeks, if definitive operative management is not arranged in the interval. - The patient was again instructed to not flush the percutaneous drainage catheter but to maintain diligent records regarding drainage catheter output  Patient knows to call the interventional radiology clinic with any interval questions or concerns.  Thank you for this interesting consult.  I greatly enjoyed meeting Breasia A Withington and look forward to participating in their care.  A copy of this report was sent to the requesting provider on this date.  Electronically Signed: Sandi Mariscal 03/20/2018, 12:38 PM   I spent a total of 10 Minutes in face to face in clinical consultation, greater than 50% of which was counseling/coordinating care for percutaneous drainage catheter evaluation.

## 2018-03-21 DIAGNOSIS — K5720 Diverticulitis of large intestine with perforation and abscess without bleeding: Secondary | ICD-10-CM | POA: Diagnosis not present

## 2018-03-29 ENCOUNTER — Encounter: Payer: Self-pay | Admitting: Gastroenterology

## 2018-04-03 ENCOUNTER — Ambulatory Visit
Admission: RE | Admit: 2018-04-03 | Discharge: 2018-04-03 | Disposition: A | Discharge status: Home / Self Care | Payer: Medicare Other | Source: Ambulatory Visit | Attending: Surgery | Admitting: Surgery

## 2018-04-03 DIAGNOSIS — L0291 Cutaneous abscess, unspecified: Secondary | ICD-10-CM

## 2018-04-03 DIAGNOSIS — K5780 Diverticulitis of intestine, part unspecified, with perforation and abscess without bleeding: Secondary | ICD-10-CM | POA: Diagnosis not present

## 2018-04-03 HISTORY — PX: IR RADIOLOGIST EVAL & MGMT: IMG5224

## 2018-04-03 NOTE — Progress Notes (Addendum)
Referring Physician(s): Cornett,Thomas  Chief Complaint: The patient is seen in follow up today s/p diverticular abscess drain placement 12/26/2017   History of present illness:  Pt has been followed in  IR OP Clinic Drain injections continue to show +fistula to bowel Most recent injection 03/20/18 Persistent fistula Pt saw Dr Brantley Stage 10/24 and she was told fistula seems almost as healed as it will be-- "if she chooses no surgery--- could refer her to [facility] for evaluation and consideration of management" if would like.  Here today for another injection Denies chills/ N/V No pain Has not flushed drain and No to little OP in last 2 weeks   Past Medical History:  Diagnosis Date  . Breast cancer ([place])   . Diverticulosis   . Hiatal hernia   . Hypercholesteremia   . Hypertension   . Osteopenia   . Osteoporosis     Past Surgical History:  Procedure Laterality Date  . ABDOMINAL HYSTERECTOMY    . IR RADIOLOGIST EVAL & MGMT  01/03/2018  . IR RADIOLOGIST EVAL & MGMT  01/31/2018  . IR RADIOLOGIST EVAL & MGMT  02/27/2018  . IR RADIOLOGIST EVAL & MGMT  03/20/2018  . IR RADIOLOGIST EVAL & MGMT  04/03/2018  . MASTECTOMY  1999   LEFT    Allergies: Patient has no known allergies.  Medications: Prior to Admission medications   Medication Sig Start Date End Date Taking? Authorizing Provider  ALPRAZolam Duanne Moron) 0.25 MG tablet Take 1/2 tablet by mouth at bedtime as needed for anxiety (sleep)    [provider]  amLODipine (NORVASC) 5 MG tablet Take 1 tablet (5 mg total) by mouth daily. 12/29/17   Regalado, Belkys A, MD  Ascorbic Acid (VITAMIN C) 500 MG tablet Take 500 mg by mouth daily.      [provider]  aspirin EC 81 MG tablet Take 1 tablet (81 mg total) by mouth daily. 10/18/16   Richardson Dopp T, PA-C  atorvastatin (LIPITOR) 20 MG tablet Take 20 mg by mouth daily. 03/02/16   [provider]  Cholecalciferol (VITAMIN D) 1000 UNITS capsule Take 1,000  Units by mouth daily.      [provider]  Coenzyme Q10 (CO Q-10) 200 MG CAPS Take 1 capsule by mouth daily.     [provider]  ezetimibe (ZETIA) 10 MG tablet Take 10 mg by mouth daily. 02/26/16   [provider]  metFORMIN (GLUCOPHAGE) 500 MG tablet Take 1 tablet (500 mg total) by mouth 2 (two) times daily with a meal. 12/28/17 12/28/18  Regalado, Belkys A, MD  metoprolol succinate (TOPROL-XL) 25 MG 24 hr tablet TAKE 1/2 TABLET BY MOUTH DAILY 10/01/17   Nahser, Wonda Cheng, MD  Multiple Vitamin (MULTIVITAMIN) tablet Take 1 tablet by mouth daily.      [provider]  omeprazole (PRILOSEC) 20 MG capsule Take 1 capsule (20 mg total) by mouth daily. 05/27/15   Darlin Coco, MD  Psyllium (METAMUCIL PO) Take 1 Dose by mouth at bedtime. Mix one teaspoon with 8oz of water.    [provider]  raloxifene (EVISTA) 60 MG tablet TAKE 1 TABLET BY MOUTH ONCE DAILY Patient taking differently: TAKE 60MG  BY MOUTH ONCE DAILY 12/23/14   Darlin Coco, MD  saccharomyces boulardii (FLORASTOR) 250 MG capsule Take 1 capsule (250 mg total) by mouth 2 (two) times daily. 12/28/17   Regalado, Belkys A, MD  sodium chloride flush (NS) 0.9 % SOLN Inject 10 mLs into the vein as needed (  flush with 5cc daily). 12/28/17   Saverio Danker, PA-C  traMADol (ULTRAM) 50 MG tablet Take 0.5 tablets (25 mg total) by mouth every 8 (eight) hours as needed for moderate pain. 12/28/17   Regalado, Belkys A, MD  vitamin B-12 (CYANOCOBALAMIN) 500 MCG tablet Take 500 mcg by mouth daily.     [provider]     Family History  Problem Relation Age of Onset  . Arthritis Father   . Stroke Father   . Hypertension Mother     Social History   Socioeconomic History  . Marital status: Single    Spouse name: Not on file  . Number of children: Not on file  . Years of education: Not on file  . Highest education level: Not on file  Occupational History  . Not on file  Social Needs  . Financial  resource strain: Not on file  . Food insecurity:    Worry: Not on file    Inability: Not on file  . Transportation needs:    Medical: Not on file    Non-medical: Not on file  Tobacco Use  . Smoking status: Never Smoker  . Smokeless tobacco: Never Used  Substance and Sexual Activity  . Alcohol use: Not on file  . Drug use: Not on file  . Sexual activity: Not on file  Lifestyle  . Physical activity:    Days per week: Not on file    Minutes per session: Not on file  . Stress: Not on file  Relationships  . Social connections:    Talks on phone: Not on file    Gets together: Not on file    Attends religious service: Not on file    Active member of club or organization: Not on file    Attends meetings of clubs or organizations: Not on file    Relationship status: Not on file  Other Topics Concern  . Not on file  Social History Narrative  . Not on file     Vital Signs: BP 134/62   Pulse 100   Temp 97.8 F (36.6 C)   SpO2 100%   Physical Exam  Abdominal: Soft.  Skin: Skin is warm and dry.  Site is clean and dry NT no bleeding No OP in bag  Injection shows NO fistula today-- all contrast coming out of site.     Imaging: Ir Radiologist Eval & Mgmt  Result Date: 04/03/2018 Please refer to notes tab for details about interventional procedure. (Op Note)   Labs:  CBC: Recent Labs    12/25/17 0634 12/26/17 1246 12/27/17 0508 12/28/17 0517  WBC 13.1* 10.5 13.6* 13.5*  HGB 12.2 12.4 11.6* 11.1*  HCT 38.2 38.6 36.4 34.7*  PLT 389 368 363 312    COAGS: Recent Labs    12/19/17 2053 12/26/17 0602  INR 1.19 1.07  APTT 35  --     BMP: Recent Labs    12/23/17 0458 12/25/17 0634 12/27/17 1405 12/28/17 0517  NA 140 139 135 135  K 3.7 3.8 4.2 4.0  CL 108 105 97* 99  CO2 27 26 25 27   GLUCOSE 98 119* 177* 102*  BUN <5* 5* 10 10  CALCIUM 8.3* 9.1 9.2 8.9  CREATININE 1.00 0.88 0.95 1.05*  GFRNONAA 49* 57* 52* 46*  GFRAA 57* >60 >60 54*    LIVER  FUNCTION TESTS: Recent Labs    12/19/17 1558 12/19/17 2053  BILITOT 0.4 0.3  AST 25 19  ALT 21 18  ALKPHOS 61 55  PROT 6.6 5.8*  ALBUMIN 3.2* 3.0*    Assessment:  Divertic abscess drain placed 12/26/17 IR following continued fistula to bowel Today's injections does not show any fistula per Dr Earleen Newport No OP; No fever; No collection  Drain removed at bedside without complication per Dr Earleen Newport Follow with Dr Brantley Stage  Signed: Lavonia Drafts, PA-C 04/03/2018, 1:18 PM   Please refer to Dr. Earleen Newport attestation of this note for management and plan.

## 2018-04-12 DIAGNOSIS — K5720 Diverticulitis of large intestine with perforation and abscess without bleeding: Secondary | ICD-10-CM | POA: Diagnosis not present

## 2018-04-29 ENCOUNTER — Ambulatory Visit: Payer: Medicare Other | Admitting: Gastroenterology

## 2018-08-27 DIAGNOSIS — K632 Fistula of intestine: Secondary | ICD-10-CM | POA: Diagnosis not present

## 2018-08-27 DIAGNOSIS — K862 Cyst of pancreas: Secondary | ICD-10-CM | POA: Diagnosis not present

## 2018-08-27 DIAGNOSIS — E1169 Type 2 diabetes mellitus with other specified complication: Secondary | ICD-10-CM | POA: Diagnosis not present

## 2018-08-27 DIAGNOSIS — I10 Essential (primary) hypertension: Secondary | ICD-10-CM | POA: Diagnosis not present

## 2018-08-27 DIAGNOSIS — M810 Age-related osteoporosis without current pathological fracture: Secondary | ICD-10-CM | POA: Diagnosis not present

## 2018-12-02 ENCOUNTER — Encounter (HOSPITAL_COMMUNITY): Payer: Self-pay

## 2018-12-02 ENCOUNTER — Inpatient Hospital Stay (HOSPITAL_COMMUNITY): Payer: Medicare Other

## 2018-12-02 ENCOUNTER — Emergency Department (HOSPITAL_COMMUNITY): Payer: Medicare Other

## 2018-12-02 ENCOUNTER — Inpatient Hospital Stay (HOSPITAL_COMMUNITY)
Admission: EM | Admit: 2018-12-02 | Discharge: 2018-12-06 | DRG: 389 | Disposition: A | Discharge status: Home / Self Care | Payer: Medicare Other | Attending: Family Medicine | Admitting: Family Medicine

## 2018-12-02 ENCOUNTER — Other Ambulatory Visit: Payer: Self-pay

## 2018-12-02 DIAGNOSIS — K5712 Diverticulitis of small intestine without perforation or abscess without bleeding: Secondary | ICD-10-CM | POA: Diagnosis present

## 2018-12-02 DIAGNOSIS — Z853 Personal history of malignant neoplasm of breast: Secondary | ICD-10-CM

## 2018-12-02 DIAGNOSIS — Z823 Family history of stroke: Secondary | ICD-10-CM | POA: Diagnosis not present

## 2018-12-02 DIAGNOSIS — E1129 Type 2 diabetes mellitus with other diabetic kidney complication: Secondary | ICD-10-CM | POA: Diagnosis present

## 2018-12-02 DIAGNOSIS — Z7984 Long term (current) use of oral hypoglycemic drugs: Secondary | ICD-10-CM | POA: Diagnosis not present

## 2018-12-02 DIAGNOSIS — K219 Gastro-esophageal reflux disease without esophagitis: Secondary | ICD-10-CM | POA: Diagnosis present

## 2018-12-02 DIAGNOSIS — E876 Hypokalemia: Secondary | ICD-10-CM | POA: Diagnosis present

## 2018-12-02 DIAGNOSIS — Z8261 Family history of arthritis: Secondary | ICD-10-CM | POA: Diagnosis not present

## 2018-12-02 DIAGNOSIS — M810 Age-related osteoporosis without current pathological fracture: Secondary | ICD-10-CM | POA: Diagnosis present

## 2018-12-02 DIAGNOSIS — R05 Cough: Secondary | ICD-10-CM | POA: Diagnosis not present

## 2018-12-02 DIAGNOSIS — F419 Anxiety disorder, unspecified: Secondary | ICD-10-CM | POA: Diagnosis present

## 2018-12-02 DIAGNOSIS — R112 Nausea with vomiting, unspecified: Secondary | ICD-10-CM | POA: Diagnosis present

## 2018-12-02 DIAGNOSIS — E1122 Type 2 diabetes mellitus with diabetic chronic kidney disease: Secondary | ICD-10-CM | POA: Diagnosis present

## 2018-12-02 DIAGNOSIS — R1110 Vomiting, unspecified: Secondary | ICD-10-CM | POA: Diagnosis not present

## 2018-12-02 DIAGNOSIS — E860 Dehydration: Secondary | ICD-10-CM | POA: Diagnosis present

## 2018-12-02 DIAGNOSIS — E7800 Pure hypercholesterolemia, unspecified: Secondary | ICD-10-CM | POA: Diagnosis present

## 2018-12-02 DIAGNOSIS — R110 Nausea: Secondary | ICD-10-CM | POA: Diagnosis not present

## 2018-12-02 DIAGNOSIS — Z7982 Long term (current) use of aspirin: Secondary | ICD-10-CM | POA: Diagnosis not present

## 2018-12-02 DIAGNOSIS — I1310 Hypertensive heart and chronic kidney disease without heart failure, with stage 1 through stage 4 chronic kidney disease, or unspecified chronic kidney disease: Secondary | ICD-10-CM | POA: Diagnosis present

## 2018-12-02 DIAGNOSIS — K5669 Other partial intestinal obstruction: Secondary | ICD-10-CM | POA: Diagnosis not present

## 2018-12-02 DIAGNOSIS — R739 Hyperglycemia, unspecified: Secondary | ICD-10-CM | POA: Diagnosis present

## 2018-12-02 DIAGNOSIS — Z1159 Encounter for screening for other viral diseases: Secondary | ICD-10-CM | POA: Diagnosis not present

## 2018-12-02 DIAGNOSIS — R52 Pain, unspecified: Secondary | ICD-10-CM | POA: Diagnosis not present

## 2018-12-02 DIAGNOSIS — N183 Chronic kidney disease, stage 3 (moderate): Secondary | ICD-10-CM | POA: Diagnosis present

## 2018-12-02 DIAGNOSIS — I119 Hypertensive heart disease without heart failure: Secondary | ICD-10-CM | POA: Diagnosis not present

## 2018-12-02 DIAGNOSIS — Z4659 Encounter for fitting and adjustment of other gastrointestinal appliance and device: Secondary | ICD-10-CM

## 2018-12-02 DIAGNOSIS — Z8249 Family history of ischemic heart disease and other diseases of the circulatory system: Secondary | ICD-10-CM

## 2018-12-02 DIAGNOSIS — E785 Hyperlipidemia, unspecified: Secondary | ICD-10-CM | POA: Diagnosis present

## 2018-12-02 DIAGNOSIS — R059 Cough, unspecified: Secondary | ICD-10-CM

## 2018-12-02 DIAGNOSIS — Z0189 Encounter for other specified special examinations: Secondary | ICD-10-CM

## 2018-12-02 DIAGNOSIS — K5730 Diverticulosis of large intestine without perforation or abscess without bleeding: Secondary | ICD-10-CM | POA: Diagnosis present

## 2018-12-02 DIAGNOSIS — D72829 Elevated white blood cell count, unspecified: Secondary | ICD-10-CM | POA: Diagnosis present

## 2018-12-02 DIAGNOSIS — Z9010 Acquired absence of unspecified breast and nipple: Secondary | ICD-10-CM

## 2018-12-02 DIAGNOSIS — Z79899 Other long term (current) drug therapy: Secondary | ICD-10-CM

## 2018-12-02 DIAGNOSIS — K56609 Unspecified intestinal obstruction, unspecified as to partial versus complete obstruction: Principal | ICD-10-CM

## 2018-12-02 DIAGNOSIS — Z03818 Encounter for observation for suspected exposure to other biological agents ruled out: Secondary | ICD-10-CM | POA: Diagnosis not present

## 2018-12-02 DIAGNOSIS — E1165 Type 2 diabetes mellitus with hyperglycemia: Secondary | ICD-10-CM | POA: Diagnosis present

## 2018-12-02 DIAGNOSIS — K449 Diaphragmatic hernia without obstruction or gangrene: Secondary | ICD-10-CM | POA: Diagnosis present

## 2018-12-02 DIAGNOSIS — R1084 Generalized abdominal pain: Secondary | ICD-10-CM | POA: Diagnosis not present

## 2018-12-02 DIAGNOSIS — Z90710 Acquired absence of both cervix and uterus: Secondary | ICD-10-CM

## 2018-12-02 LAB — LACTIC ACID, PLASMA
Lactic Acid, Venous: 4.2 mmol/L (ref 0.5–1.9)
Lactic Acid, Venous: 4 mmol/L (ref 0.5–1.9)

## 2018-12-02 LAB — CBC
HCT: 43.4 % (ref 36.0–46.0)
Hemoglobin: 13.6 g/dL (ref 12.0–15.0)
MCH: 29 pg (ref 26.0–34.0)
MCHC: 31.3 g/dL (ref 30.0–36.0)
MCV: 92.5 fL (ref 80.0–100.0)
Platelets: 237 10*3/uL (ref 150–400)
RBC: 4.69 MIL/uL (ref 3.87–5.11)
RDW: 13.6 % (ref 11.5–15.5)
WBC: 15.7 10*3/uL — ABNORMAL HIGH (ref 4.0–10.5)
nRBC: 0 % (ref 0.0–0.2)

## 2018-12-02 LAB — CBC WITH DIFFERENTIAL/PLATELET
Abs Immature Granulocytes: 0.11 10*3/uL — ABNORMAL HIGH (ref 0.00–0.07)
Basophils Absolute: 0.1 10*3/uL (ref 0.0–0.1)
Basophils Relative: 0 %
Eosinophils Absolute: 0 10*3/uL (ref 0.0–0.5)
Eosinophils Relative: 0 %
HCT: 45.9 % (ref 36.0–46.0)
Hemoglobin: 15 g/dL (ref 12.0–15.0)
Immature Granulocytes: 0 %
Lymphocytes Relative: 5 %
Lymphs Abs: 1.2 10*3/uL (ref 0.7–4.0)
MCH: 29.9 pg (ref 26.0–34.0)
MCHC: 32.7 g/dL (ref 30.0–36.0)
MCV: 91.4 fL (ref 80.0–100.0)
Monocytes Absolute: 1.2 10*3/uL — ABNORMAL HIGH (ref 0.1–1.0)
Monocytes Relative: 5 %
Neutro Abs: 22 10*3/uL — ABNORMAL HIGH (ref 1.7–7.7)
Neutrophils Relative %: 90 %
Platelets: 254 10*3/uL (ref 150–400)
RBC: 5.02 MIL/uL (ref 3.87–5.11)
RDW: 13.3 % (ref 11.5–15.5)
WBC: 24.5 10*3/uL — ABNORMAL HIGH (ref 4.0–10.5)
nRBC: 0 % (ref 0.0–0.2)

## 2018-12-02 LAB — GLUCOSE, CAPILLARY
Glucose-Capillary: 167 mg/dL — ABNORMAL HIGH (ref 70–99)
Glucose-Capillary: 150 mg/dL — ABNORMAL HIGH (ref 70–99)

## 2018-12-02 LAB — COMPREHENSIVE METABOLIC PANEL
ALT: 19 U/L (ref 0–44)
AST: 20 U/L (ref 15–41)
Albumin: 4.5 g/dL (ref 3.5–5.0)
Alkaline Phosphatase: 49 U/L (ref 38–126)
Anion gap: 12 (ref 5–15)
BUN: 25 mg/dL — ABNORMAL HIGH (ref 8–23)
CO2: 23 mmol/L (ref 22–32)
Calcium: 9.5 mg/dL (ref 8.9–10.3)
Chloride: 104 mmol/L (ref 98–111)
Creatinine, Ser: 0.89 mg/dL (ref 0.44–1.00)
GFR calc Af Amer: >60 mL/min (ref >60)
GFR calc non Af Amer: 58 mL/min — ABNORMAL LOW (ref >60)
Glucose, Bld: 209 mg/dL — ABNORMAL HIGH (ref 70–99)
Potassium: 4 mmol/L (ref 3.5–5.1)
Sodium: 139 mmol/L (ref 135–145)
Total Bilirubin: 0.6 mg/dL (ref 0.3–1.2)
Total Protein: 7.9 g/dL (ref 6.5–8.1)

## 2018-12-02 LAB — URINALYSIS, ROUTINE W REFLEX MICROSCOPIC
Bilirubin Urine: NEGATIVE
Glucose, UA: 50 mg/dL — AB
Hgb urine dipstick: NEGATIVE
Ketones, ur: 5 mg/dL — AB
Leukocytes,Ua: NEGATIVE
Nitrite: NEGATIVE
Protein, ur: NEGATIVE mg/dL
Specific Gravity, Urine: 1.031 — ABNORMAL HIGH (ref 1.005–1.030)
pH: 7 (ref 5.0–8.0)

## 2018-12-02 LAB — SARS CORONAVIRUS 2 BY RT PCR (HOSPITAL ORDER, PERFORMED IN ~~LOC~~ HOSPITAL LAB): SARS Coronavirus 2: NEGATIVE

## 2018-12-02 LAB — PROCALCITONIN: Procalcitonin: 0.33 ng/mL

## 2018-12-02 LAB — LIPASE, BLOOD: Lipase: 48 U/L (ref 11–51)

## 2018-12-02 LAB — CREATININE, SERUM
Creatinine, Ser: 0.99 mg/dL (ref 0.44–1.00)
GFR calc Af Amer: 59 mL/min — ABNORMAL LOW (ref >60)
GFR calc non Af Amer: 51 mL/min — ABNORMAL LOW (ref >60)

## 2018-12-02 LAB — HEMOGLOBIN A1C
Hgb A1c MFr Bld: 6.7 % — ABNORMAL HIGH (ref 4.8–5.6)
Mean Plasma Glucose: 145.59 mg/dL

## 2018-12-02 MED ORDER — METOPROLOL TARTRATE 5 MG/5ML IV SOLN
2.5000 mg | Freq: Three times a day (TID) | INTRAVENOUS | Status: DC
  Administered 2018-12-02 – 2018-12-05 (×9): 2.5 mg via INTRAVENOUS
  Filled 2018-12-02 (×9): qty 5

## 2018-12-02 MED ORDER — LORAZEPAM 2 MG/ML IJ SOLN
0.2500 mg | Freq: Every evening | INTRAMUSCULAR | Status: DC | PRN
  Administered 2018-12-02 – 2018-12-05 (×4): 0.25 mg via INTRAVENOUS
  Filled 2018-12-02 (×4): qty 1

## 2018-12-02 MED ORDER — MORPHINE SULFATE (PF) 4 MG/ML IV SOLN
4.0000 mg | Freq: Once | INTRAVENOUS | Status: AC
  Administered 2018-12-02: 05:00:00 4 mg via INTRAVENOUS
  Filled 2018-12-02: qty 1

## 2018-12-02 MED ORDER — LACTATED RINGERS IV BOLUS
1000.0000 mL | Freq: Once | INTRAVENOUS | Status: AC
  Administered 2018-12-02: 12:00:00 1000 mL via INTRAVENOUS

## 2018-12-02 MED ORDER — PIPERACILLIN-TAZOBACTAM 3.375 G IVPB 30 MIN
3.3750 g | Freq: Once | INTRAVENOUS | Status: AC
  Administered 2018-12-02: 07:00:00 3.375 g via INTRAVENOUS
  Filled 2018-12-02: qty 50

## 2018-12-02 MED ORDER — OXYCODONE HCL 5 MG PO TABS: 5.0000 mg | ORAL_TABLET | ORAL | Status: DC | PRN

## 2018-12-02 MED ORDER — ONDANSETRON HCL 4 MG/2ML IJ SOLN
4.0000 mg | Freq: Once | INTRAMUSCULAR | Status: AC
  Administered 2018-12-02: 02:00:00 4 mg via INTRAVENOUS
  Filled 2018-12-02: qty 2

## 2018-12-02 MED ORDER — IOHEXOL 300 MG/ML  SOLN
75.0000 mL | Freq: Once | INTRAMUSCULAR | Status: AC | PRN
  Administered 2018-12-02: 75 mL via INTRAVENOUS

## 2018-12-02 MED ORDER — IOHEXOL 300 MG/ML  SOLN
30.0000 mL | Freq: Once | INTRAMUSCULAR | Status: AC | PRN
  Administered 2018-12-02: 04:00:00 30 mL via ORAL

## 2018-12-02 MED ORDER — ACETAMINOPHEN 650 MG RE SUPP: 650.0000 mg | Freq: Four times a day (QID) | RECTAL | Status: DC | PRN

## 2018-12-02 MED ORDER — SODIUM CHLORIDE 0.9 % IV BOLUS
1000.0000 mL | Freq: Once | INTRAVENOUS | Status: AC
  Administered 2018-12-02: 02:00:00 1000 mL via INTRAVENOUS

## 2018-12-02 MED ORDER — DIATRIZOATE MEGLUMINE & SODIUM 66-10 % PO SOLN
90.0000 mL | Freq: Once | ORAL | Status: AC
  Administered 2018-12-02: 13:00:00 90 mL via NASOGASTRIC
  Filled 2018-12-02: qty 90

## 2018-12-02 MED ORDER — PROCHLORPERAZINE EDISYLATE 10 MG/2ML IJ SOLN
10.0000 mg | Freq: Once | INTRAMUSCULAR | Status: AC
  Administered 2018-12-02: 10 mg via INTRAVENOUS
  Filled 2018-12-02: qty 2

## 2018-12-02 MED ORDER — PIPERACILLIN-TAZOBACTAM 3.375 G IVPB
3.3750 g | Freq: Three times a day (TID) | INTRAVENOUS | Status: DC
  Administered 2018-12-02 – 2018-12-06 (×12): 3.375 g via INTRAVENOUS
  Filled 2018-12-02 (×12): qty 50

## 2018-12-02 MED ORDER — ONDANSETRON HCL 4 MG/2ML IJ SOLN
4.0000 mg | Freq: Four times a day (QID) | INTRAMUSCULAR | Status: DC | PRN
  Administered 2018-12-05 (×2): 4 mg via INTRAVENOUS
  Filled 2018-12-02 (×2): qty 2

## 2018-12-02 MED ORDER — ONDANSETRON HCL 4 MG PO TABS: 4.0000 mg | ORAL_TABLET | Freq: Four times a day (QID) | ORAL | Status: DC | PRN

## 2018-12-02 MED ORDER — INSULIN ASPART 100 UNIT/ML ~~LOC~~ SOLN
0.0000 [IU] | Freq: Four times a day (QID) | SUBCUTANEOUS | Status: DC | PRN
  Administered 2018-12-03: 12:00:00 1 [IU] via SUBCUTANEOUS
  Administered 2018-12-04: 12:00:00 5 [IU] via SUBCUTANEOUS
  Administered 2018-12-04 – 2018-12-05 (×3): 1 [IU] via SUBCUTANEOUS
  Filled 2018-12-02 (×5): qty 0.09

## 2018-12-02 MED ORDER — MORPHINE SULFATE (PF) 2 MG/ML IV SOLN
2.0000 mg | INTRAVENOUS | Status: DC | PRN
  Administered 2018-12-02: 15:00:00 2 mg via INTRAVENOUS
  Filled 2018-12-02: qty 1

## 2018-12-02 MED ORDER — METOPROLOL SUCCINATE ER 25 MG PO TB24: 12.5000 mg | ORAL_TABLET | Freq: Every day | ORAL | Status: DC

## 2018-12-02 MED ORDER — ENOXAPARIN SODIUM 30 MG/0.3ML ~~LOC~~ SOLN
30.0000 mg | SUBCUTANEOUS | Status: DC
  Administered 2018-12-02 – 2018-12-03 (×2): 30 mg via SUBCUTANEOUS
  Filled 2018-12-02 (×3): qty 0.3

## 2018-12-02 MED ORDER — ACETAMINOPHEN 325 MG PO TABS: 650.0000 mg | ORAL_TABLET | Freq: Four times a day (QID) | ORAL | Status: DC | PRN

## 2018-12-02 MED ORDER — ALPRAZOLAM 0.25 MG PO TABS: 0.2500 mg | ORAL_TABLET | Freq: Every evening | ORAL | Status: DC | PRN

## 2018-12-02 MED ORDER — MORPHINE SULFATE (PF) 4 MG/ML IV SOLN
4.0000 mg | Freq: Once | INTRAVENOUS | Status: AC
  Administered 2018-12-02: 02:00:00 4 mg via INTRAVENOUS
  Filled 2018-12-02: qty 1

## 2018-12-02 MED ORDER — SODIUM CHLORIDE 0.9 % IV SOLN
INTRAVENOUS | Status: DC
  Administered 2018-12-02 – 2018-12-03 (×2): via INTRAVENOUS

## 2018-12-02 MED ORDER — ENOXAPARIN SODIUM 40 MG/0.4ML ~~LOC~~ SOLN: 40.0000 mg | SUBCUTANEOUS | Status: DC

## 2018-12-02 NOTE — ED Notes (Signed)
Bed: CZ44 Expected date:  Expected time:  Means of arrival:  Comments: 85yo F/ abd pain

## 2018-12-02 NOTE — ED Provider Notes (Signed)
[facility] DEPT Provider Note   CSN: [id number] Arrival date & time: 12/02/18  0009    History   Chief Complaint Chief Complaint  Patient presents with   Abdominal Pain    HPI [name] is a 85 y.o. female.   The history is provided by the patient.  She has history of hypertension, diabetes, hyperlipidemia, breast cancer, diverticulitis and comes in because of lower abdominal pain which started about 8 hours ago.  Pain is severe and she rates it at 8/10.  There is a radiation to the back.  When asked to point where the pain is, she actually points to her entire abdomen.  She denies fever, chills, sweats.  There has been nausea and vomiting.  Pain was not improved with emesis.  She has had a bowel movement which also did not affect the pain.  Nothing makes the pain better, nothing makes it worse.  Past Medical History:  Diagnosis Date   Breast cancer ([place])    Diverticulosis    Hiatal hernia    Hypercholesteremia    Hypertension    Osteopenia    Osteoporosis     Patient Active Problem List   Diagnosis Date Noted   Malnutrition of moderate degree 12/25/2017   Diverticulitis of large intestine with abscess 12/19/2017   Sepsis ([place]) 12/19/2017   HTN (hypertension) 12/19/2017   History of prediabetes 12/19/2017   GERD (gastroesophageal reflux disease) 12/19/2017   Anxiety 12/19/2017   Acute renal failure superimposed on stage 2 chronic kidney disease ([place]) 12/19/2017   Type II diabetes mellitus with renal manifestations ([place]) 12/19/2017   Pancreatic cyst 12/19/2017   Hyperglycemia    Aortic systolic murmur on examination 04/08/2015   Malaise and fatigue 04/15/2012   Right carotid bruit 10/05/2011   Hiatal hernia 04/05/2011   Benign hypertensive heart disease without heart failure 09/01/2010   Hypercholesterolemia 09/01/2010   History of breast cancer 09/01/2010   Osteopenia 09/01/2010    Past Surgical  History:  Procedure Laterality Date   ABDOMINAL HYSTERECTOMY     IR RADIOLOGIST EVAL & MGMT  01/03/2018   IR RADIOLOGIST EVAL & MGMT  01/31/2018   IR RADIOLOGIST EVAL & MGMT  02/27/2018   IR RADIOLOGIST EVAL & MGMT  03/20/2018   IR RADIOLOGIST EVAL & MGMT  04/03/2018   MASTECTOMY  1999   LEFT     OB History   No obstetric history on file.      Home Medications    Prior to Admission medications   Medication Sig Start Date End Date Taking? Authorizing Provider  ALPRAZolam Duanne Moron) 0.25 MG tablet Take 1/2 tablet by mouth at bedtime as needed for anxiety (sleep)    [provider]  amLODipine (NORVASC) 5 MG tablet Take 1 tablet (5 mg total) by mouth daily. 12/29/17   Regalado, Belkys A, MD  Ascorbic Acid (VITAMIN C) 500 MG tablet Take 500 mg by mouth daily.      [provider]  aspirin EC 81 MG tablet Take 1 tablet (81 mg total) by mouth daily. 10/18/16   Richardson Dopp T, PA-C  atorvastatin (LIPITOR) 20 MG tablet Take 20 mg by mouth daily. 03/02/16   [provider]  Cholecalciferol (VITAMIN D) 1000 UNITS capsule Take 1,000 Units by mouth daily.      [provider]  Coenzyme Q10 (CO Q-10) 200 MG CAPS Take 1 capsule by mouth daily.     [provider]  ezetimibe (ZETIA) 10 MG  tablet Take 10 mg by mouth daily. 02/26/16   [provider]  metFORMIN (GLUCOPHAGE) 500 MG tablet Take 1 tablet (500 mg total) by mouth 2 (two) times daily with a meal. 12/28/17 12/28/18  Regalado, Belkys A, MD  metoprolol succinate (TOPROL-XL) 25 MG 24 hr tablet TAKE 1/2 TABLET BY MOUTH DAILY 10/01/17   Nahser, Wonda Cheng, MD  Multiple Vitamin (MULTIVITAMIN) tablet Take 1 tablet by mouth daily.      [provider]  omeprazole (PRILOSEC) 20 MG capsule Take 1 capsule (20 mg total) by mouth daily. 05/27/15   Darlin Coco, MD  Psyllium (METAMUCIL PO) Take 1 Dose by mouth at bedtime. Mix one teaspoon with 8oz of water.    [provider]  raloxifene  (EVISTA) 60 MG tablet TAKE 1 TABLET BY MOUTH ONCE DAILY Patient taking differently: TAKE 60MG  BY MOUTH ONCE DAILY 12/23/14   Darlin Coco, MD  saccharomyces boulardii (FLORASTOR) 250 MG capsule Take 1 capsule (250 mg total) by mouth 2 (two) times daily. 12/28/17   Regalado, Belkys A, MD  sodium chloride flush (NS) 0.9 % SOLN Inject 10 mLs into the vein as needed (flush with 5cc daily). 12/28/17   Saverio Danker, PA-C  traMADol (ULTRAM) 50 MG tablet Take 0.5 tablets (25 mg total) by mouth every 8 (eight) hours as needed for moderate pain. 12/28/17   Regalado, Belkys A, MD  vitamin B-12 (CYANOCOBALAMIN) 500 MCG tablet Take 500 mcg by mouth daily.     [provider]    Family History Family History  Problem Relation Age of Onset   Arthritis Father    Stroke Father    Hypertension Mother     Social History Social History   Tobacco Use   Smoking status: Never Smoker   Smokeless tobacco: Never Used  Substance Use Topics   Alcohol use: Not on file   Drug use: Not on file     Allergies   Patient has no known allergies.   Review of Systems Review of Systems  All other systems reviewed and are negative.    Physical Exam Updated Vital Signs BP 139/68 (BP Location: Left Arm)    Pulse 90    Temp 97.8 F (36.6 C) (Oral)    Resp 17    Ht 4\' 11"  (1.499 m)    Wt 40.8 kg    SpO2 99%    BMI 18.18 kg/m   Physical Exam Vitals signs and nursing note reviewed.    86 year old female, resting comfortably and in no acute distress. Vital signs are normal. Oxygen saturation is 99%, which is normal. Head is normocephalic and atraumatic. PERRLA, EOMI. Oropharynx is clear. Neck is nontender and supple without adenopathy or JVD. Back is nontender and there is no CVA tenderness. Lungs are clear without rales, wheezes, or rhonchi. Chest is nontender. Heart has regular rate and rhythm without murmur. Abdomen is soft, mildly distended, with diffuse tenderness.  There is no rebound  or guarding.  There are no masses or hepatosplenomegaly and peristalsis is hypoactive. Extremities have no cyanosis or edema, full range of motion is present. Skin is warm and dry without rash. Neurologic: Mental status is normal, cranial nerves are intact, there are no motor or sensory deficits.  ED Treatments / Results  Labs (all labs ordered are listed, but only abnormal results are displayed) Labs Reviewed  COMPREHENSIVE METABOLIC PANEL - Abnormal; Notable for the following components:      Result Value   Glucose, Bld 209 (*)  BUN 25 (*)    GFR calc non Af Amer 58 (*)    All other components within normal limits  CBC WITH DIFFERENTIAL/PLATELET - Abnormal; Notable for the following components:   WBC 24.5 (*)    Neutro Abs 22.0 (*)    Monocytes Absolute 1.2 (*)    Abs Immature Granulocytes 0.11 (*)    All other components within normal limits  SARS CORONAVIRUS 2 (HOSPITAL ORDER, [place] [facility])  LIPASE, BLOOD  URINALYSIS, ROUTINE W REFLEX MICROSCOPIC    Radiology Ct Abdomen Pelvis W Contrast  Result Date: 12/02/2018 CLINICAL DATA:  Abdominal pain, diverticulitis suspected. Right and left lower quadrant abdominal pain for 6 hours. Nausea and vomiting. EXAM: CT ABDOMEN AND PELVIS WITH CONTRAST TECHNIQUE: Multidetector CT imaging of the abdomen and pelvis was performed using the standard protocol following bolus administration of intravenous contrast. CONTRAST:  36mL OMNIPAQUE IOHEXOL 300 MG/ML SOLN, 81mL OMNIPAQUE IOHEXOL 300 MG/ML SOLN COMPARISON:  CT of the abdomen pelvis 01/03/2018 FINDINGS: Lower chest: No acute abnormality. Hepatobiliary: No focal liver abnormality is seen. No gallstones, gallbladder wall thickening, or biliary dilatation. Pancreas: There is mild dilation of the pancreatic duct without an obstructing lesion. Calcifications are present. No mass lesion is present. Spleen: Normal in size without focal abnormality. Adrenals/Urinary Tract: The  adrenal glands are normal bilaterally. Kidneys are atrophic. No stone or mass lesion is present. Ureters are within normal limits. The urinary bladder is normal. Stomach/Bowel: A large hiatal hernia is present. Stomach is mildly dilated. Duodenum is unremarkable. A proximal small bowel obstruction is present. Transition point is in the left lower quadrant. This is adjacent to the area where the previous diverticular abscess was present. There is no focal collection. There is abrupt narrowing of the small bowel. The more distal small bowel is collapsed. The colon is mostly collapsed. Diverticular changes are noted within the distal descending and sigmoid colon without focal inflammation Vascular/Lymphatic: Atherosclerotic changes are noted in the aorta without aneurysm. No significant retroperitoneal adenopathy is present. Reproductive: Status post hysterectomy. No adnexal masses. Other: A small amount of free fluid is present adjacent to the distal obstructed small bowel. There is no free air. No significant ventral hernias are present. Musculoskeletal: Levoconvex scoliosis is present in the lumbar spine. Associated degenerative changes are present. No focal lytic or blastic lesions are present. Bony pelvis is intact. The hips are located. Degenerative changes are worse on the right. IMPRESSION: 1. Small bowel obstruction. Transition point is in the left lower quadrant. This is adjacent to the area of the remote diverticular abscess. This most likely is related to adhesions. Although no discrete mass lesion is present, small bowel neoplasm is also considered. 2. Distal descending and sigmoid diverticulosis without diverticulitis. No residual abscess or fluid collection. 3.  Aortic Atherosclerosis (ICD10-I70.0). 4. Scoliosis and degenerative changes of the lumbar spine. Electronically Signed   By: San Morelle M.D.   On: 12/02/2018 04:32    Procedures Procedures   Medications Ordered in ED Medications    ondansetron (ZOFRAN) injection 4 mg (4 mg Intravenous Given 12/02/18 0154)  sodium chloride 0.9 % bolus 1,000 mL (0 mLs Intravenous Stopped 12/02/18 0435)  morphine 4 MG/ML injection 4 mg (4 mg Intravenous Given 12/02/18 0154)  iohexol (OMNIPAQUE) 300 MG/ML solution 75 mL (75 mLs Intravenous Contrast Given 12/02/18 0339)  iohexol (OMNIPAQUE) 300 MG/ML solution 30 mL (30 mLs Oral Contrast Given 12/02/18 0340)  morphine 4 MG/ML injection 4 mg (4 mg Intravenous Given 12/02/18  0442)  prochlorperazine (COMPAZINE) injection 10 mg (10 mg Intravenous Given 12/02/18 0441)     Initial Impression / Assessment and Plan / ED Course  I have reviewed the triage vital signs and the nursing notes.  Pertinent labs & imaging results that were available during my care of the patient were reviewed by me and considered in my medical decision making (see chart for details).  Abdominal pain worrisome for recurrence of diverticulitis.  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  Other possibilities include urinary tract infection, the urolithiasis, pancreatitis, ischemic bowel.  None of these are felt to be likely.  She will be given IV fluids, morphine, ondansetron and will be sent for CT of abdomen and pelvis.  Labs show significant leukocytosis, perhaps mild prerenal azotemia.  CT scan shows small bowel obstruction with transition point in a region where she had a prior diverticular abscess.  Case is discussed with Dr. [place] [facility], who agrees to admit the patient.  Final Clinical Impressions(s) / ED Diagnoses   Final diagnoses:  Small bowel obstruction [facility])    ED Discharge Orders    None       Delora Fuel, MD 22/63/33 [phone]

## 2018-12-02 NOTE — Progress Notes (Signed)
Pharmacy Antibiotic Note  JULES VIDOVICH is a 85 y.o. female admitted on 12/02/2018 with Intra-abdominal infection.  Pharmacy has been consulted for zosyn dosing.  Plan: Zosyn 3.375g IV q8h (4 hour infusion).  Height: 4\' 11"  (149.9 cm) Weight: 90 lb (40.8 kg) IBW/kg (Calculated) : 43.2  Temp (24hrs), Avg:97.8 F (36.6 C), Min:97.8 F (36.6 C), Max:97.8 F (36.6 C)  Recent Labs  Lab 12/02/18 0138  WBC 24.5*  CREATININE 0.89    Estimated Creatinine Clearance: 28.7 mL/min (by C-G formula based on SCr of 0.89 mg/dL).    No Known Allergies  Antimicrobials this admission: Zosyn 12/02/2018 >>   Dose adjustments this admission: -  Microbiology results: -  Thank you for allowing pharmacy to be a part of this patient's care.  Nani Skillern Crowford 12/02/2018 6:18 AM

## 2018-12-02 NOTE — Progress Notes (Signed)
Occupational Therapy Treatment Patient Details Name: [name] MRN: [id number] DOB: 1929-05-11 Today's Date: 12/02/2018    History of present illness 85 yo female admitted with SBO. Hx of diverticular abscess, fibromyalgia, osteoporosis, breast cancer, s/p mastectomy   OT comments  Pt admitted with SBO. Pt currently with functional limitations due to the deficits listed below (see OT Problem List).  Pt will benefit from skilled OT to increase their safety and independence with ADL and functional mobility for ADL to facilitate discharge to venue listed below.    Follow Up Recommendations  No OT follow up    Equipment Recommendations       Recommendations for Other Services      Precautions / Restrictions Precautions Precautions: Fall Precaution Comments: NG tube Restrictions Weight Bearing Restrictions: No       Mobility Bed Mobility Overal bed mobility: Needs Assistance Bed Mobility: Supine to Sit;Sit to Supine     Supine to sit: Supervision;HOB elevated Sit to supine: Supervision;HOB elevated   General bed mobility comments: for safety, lines  Transfers Overall transfer level: Needs assistance Equipment used: 1 person hand held assist Transfers: Sit to/from Stand Sit to Stand: Min assist         General transfer comment: Assist to rise, stabilize, control descent. VCs safety, hand placement    Balance Overall balance assessment: Needs assistance         Standing balance support: Single extremity supported Standing balance-Leahy Scale: Poor                             ADL either performed or assessed with clinical judgement   ADL Overall ADL's : Needs assistance/impaired Eating/Feeding: NPO   Grooming: Oral care;Wash/dry hands;Wash/dry face;Standing;Supervision/safety   Upper Body Bathing: Set up;Sitting   Lower Body Bathing: Minimal assistance;Sit to/from stand;Cueing for safety   Upper Body Dressing : Set up;Sitting   Lower Body  Dressing: Minimal assistance;Sit to/from stand;Cueing for safety;Cueing for sequencing   Toilet Transfer: Minimal assistance;BSC;Comfort height toilet   Toileting- Clothing Manipulation and Hygiene: Minimal assistance;Sit to/from stand;Cueing for safety       Functional mobility during ADLs: Minimal assistance       Vision Patient Visual Report: No change from baseline            Cognition Arousal/Alertness: Awake/alert Behavior During Therapy: WFL for tasks assessed/performed Overall Cognitive Status: Within Functional Limits for tasks assessed                                                     Pertinent Vitals/ Pain       Pain Assessment: No/denies pain  Home Living Family/patient expects to be discharged to:: Private residence Living Arrangements: Alone   Type of Home: House Home Access: Stairs to enter Technical brewer of Steps: 3 Entrance Stairs-Rails: Right Home Layout: One level               Home Equipment: None          Prior Functioning/Environment Level of Independence: Independent        Comments: AMB independently, no mobility restrictions, drives, grocery shopping.    Frequency  Min 2X/week        Progress Toward Goals  OT Goals(current goals can now be found in the care plan  section)     Acute Rehab OT Goals Patient Stated Goal: to get better and regain PLOF OT Goal Formulation: With patient Time For Goal Achievement: 12/16/18 ADL Goals Pt Will Perform Grooming: standing;with modified independence Pt Will Perform Lower Body Dressing: with modified independence;sit to/from stand Pt Will Transfer to Toilet: with modified independence;regular height toilet Pt Will Perform Toileting - Clothing Manipulation and hygiene: with modified independence;sit to/from stand  Plan         AM-PAC OT "6 Clicks" Daily Activity     Outcome Measure   Help from another person eating meals?: None Help from another  person taking care of personal grooming?: A Little Help from another person toileting, which includes using toliet, bedpan, or urinal?: A Little Help from another person bathing (including washing, rinsing, drying)?: A Little Help from another person to put on and taking off regular upper body clothing?: None Help from another person to put on and taking off regular lower body clothing?: A Little 6 Click Score: 20    End of Session    OT Visit Diagnosis: Muscle weakness (generalized) (M62.81)   Activity Tolerance Patient tolerated treatment well   Patient Left in chair;with call bell/phone within reach   Nurse Communication Mobility status        Time: 7939-0300 OT Time Calculation (min): 16 min  Charges: OT General Charges $OT Visit: 1 Visit OT Evaluation $OT Eval Moderate Complexity: 1 Mod  [name], OT Acute Rehabilitation Services [phone] Office- [phone]      [name], Edwena Felty D 12/02/2018, 6:13 PM

## 2018-12-02 NOTE — Progress Notes (Signed)
  PROGRESS NOTE  Patient admitted earlier this morning. See H&P. [name] is a 85 y.o. female with medical history significant of essential hypertension, type 2 diabetes mellitus, HLD, GERD, anxiety, and history of diverticulitis with abscess presents with nausea, vomiting and progressive abdominal pain.  Patient reports onset of symptoms last night roughly 7 PM.  She was attempting to use the restroom and could not move her bowels.  She tried once again later that evening, with once again no effect.  She also reports significant nausea associated with vomiting.  Pain is localized to bilateral lower quadrants and is crampy in nature. CT abdomen/pelvis notable for small bowel obstruction with transition point in the left lower quadrant adjacent to remote diverticular abscess.  Descending/sigmoid diverticulosis without diverticulitis.  No appreciable abscess noted on CT.  Patient was given 1 L normal saline bolus and morphine for her abdominal pain.  She was referred for admission by ED provider for small bowel obstruction.  Patient seen and examined. Comfortable appearing, states that her pain and nausea has somewhat improved. No flatus, no BM. No bowel sounds heard, abdomen distended without TTP.   NGT IVF NPO Gen surg consulted WBC improved. Currently on zosyn, although doubt infectious process   Dessa Phi, DO [facility] [email or web address] 12/02/2018, 9:15 AM

## 2018-12-02 NOTE — ED Triage Notes (Signed)
Per EMS - Pt coming form home with LLQ  LRQ abd pain x 6 hours. Pt describes pain as dull and achy and denies any radiation of pain.   120 70 90 HR 16 R 98%  CBG 196  97.2 T

## 2018-12-02 NOTE — ED Notes (Signed)
ED TO INPATIENT HANDOFF REPORT  Name/Age/Gender [name] 85 y.o. female  Code Status Code Status History    Date Active Date Inactive Code Status Order ID Comments User Context   12/19/2017 2031 12/28/2017 1828 Full Code [id number]  Ivor Costa, MD ED   Advance Care Planning Activity      Home/SNF/Other Home  Chief Complaint Abdominal Pain  Level of Care/Admitting Diagnosis ED Disposition    ED Disposition Condition [facility] Area: [facility] [[id number]]  Level of Care: Med-Surg [16]  Covid Evaluation: Asymptomatic Screening Protocol (No Symptoms)  Diagnosis: Small bowel obstruction [facility]) [[id number]]  Admitting Physician: [place] ERIC J [[id number]]  Attending Physician: [place] ERIC J [[id number]]  Estimated length of stay: past midnight tomorrow  Certification:: I certify this patient will need inpatient services for at least 2 midnights  PT Class (Do Not Modify): Inpatient [101]  PT Acc Code (Do Not Modify): Private [1]       Medical History Past Medical History:  Diagnosis Date  . Breast cancer ([place])   . Diverticulosis   . Hiatal hernia   . Hypercholesteremia   . Hypertension   . Osteopenia   . Osteoporosis     Allergies No Known Allergies  IV Location/Drains/Wounds Patient Lines/Drains/Airways Status   Active Line/Drains/Airways    Name:   Placement date:   Placement time:   Site:   Days:   Peripheral IV 12/25/17 Left;Anterior;Upper Arm   12/25/17    2011    Arm   342   Peripheral IV Left Antecubital   -    -    Antecubital      Closed System Drain Left LLQ Bulb (JP)   12/26/17    -    LLQ   341          Labs/Imaging Results for orders placed or performed during the hospital encounter of 12/02/18 (from the past 48 hour(s))  Comprehensive metabolic panel     Status: Abnormal   Collection Time: 12/02/18  1:38 AM  Result Value Ref Range   Sodium 139 135 - 145 mmol/L   Potassium 4.0 3.5 - 5.1 mmol/L   Chloride 104 98 -  111 mmol/L   CO2 23 22 - 32 mmol/L   Glucose, Bld 209 (H) 70 - 99 mg/dL   BUN 25 (H) 8 - 23 mg/dL   Creatinine, Ser 0.89 0.44 - 1.00 mg/dL   Calcium 9.5 8.9 - 10.3 mg/dL   Total Protein 7.9 6.5 - 8.1 g/dL   Albumin 4.5 3.5 - 5.0 g/dL   AST 20 15 - 41 U/L   ALT 19 0 - 44 U/L   Alkaline Phosphatase 49 38 - 126 U/L   Total Bilirubin 0.6 0.3 - 1.2 mg/dL   GFR calc non Af Amer 58 (L) >60 mL/min   GFR calc Af Amer >60 >60 mL/min   Anion gap 12 5 - 15    Comment: Performed at [facility], [place] [address], [place], [place] [address]  Lipase, blood     Status: None   Collection Time: 12/02/18  1:38 AM  Result Value Ref Range   Lipase 48 11 - 51 U/L    Comment: Performed at [facility], [place] [address] [place], [place] [address]  CBC with Differential     Status: Abnormal   Collection Time: 12/02/18  1:38 AM  Result Value Ref Range   WBC 24.5 (H) 4.0 -  10.5 K/uL   RBC 5.02 3.87 - 5.11 MIL/uL   Hemoglobin 15.0 12.0 - 15.0 g/dL   HCT 45.9 36.0 - 46.0 %   MCV 91.4 80.0 - 100.0 fL   MCH 29.9 26.0 - 34.0 pg   MCHC 32.7 30.0 - 36.0 g/dL   RDW 13.3 11.5 - 15.5 %   Platelets 254 150 - 400 K/uL   nRBC 0.0 0.0 - 0.2 %   Neutrophils Relative % 90 %   Neutro Abs 22.0 (H) 1.7 - 7.7 K/uL   Lymphocytes Relative 5 %   Lymphs Abs 1.2 0.7 - 4.0 K/uL   Monocytes Relative 5 %   Monocytes Absolute 1.2 (H) 0.1 - 1.0 K/uL   Eosinophils Relative 0 %   Eosinophils Absolute 0.0 0.0 - 0.5 K/uL   Basophils Relative 0 %   Basophils Absolute 0.1 0.0 - 0.1 K/uL   Immature Granulocytes 0 %   Abs Immature Granulocytes 0.11 (H) 0.00 - 0.07 K/uL    Comment: Performed at [facility], [place] [address], [place], [place] [address]  Urinalysis, Routine w reflex microscopic     Status: Abnormal   Collection Time: 12/02/18  1:38 AM  Result Value Ref Range   Color, Urine YELLOW YELLOW   APPearance CLEAR CLEAR   Specific Gravity, Urine 1.031 (H) 1.005 - 1.030    pH 7.0 5.0 - 8.0   Glucose, UA 50 (A) NEGATIVE mg/dL   Hgb urine dipstick NEGATIVE NEGATIVE   Bilirubin Urine NEGATIVE NEGATIVE   Ketones, ur 5 (A) NEGATIVE mg/dL   Protein, ur NEGATIVE NEGATIVE mg/dL   Nitrite NEGATIVE NEGATIVE   Leukocytes,Ua NEGATIVE NEGATIVE    Comment: Performed at [place] [address] [place], [place] [address]   Ct Abdomen Pelvis W Contrast  Result Date: 12/02/2018 CLINICAL DATA:  Abdominal pain, diverticulitis suspected. Right and left lower quadrant abdominal pain for 6 hours. Nausea and vomiting. EXAM: CT ABDOMEN AND PELVIS WITH CONTRAST TECHNIQUE: Multidetector CT imaging of the abdomen and pelvis was performed using the standard protocol following bolus administration of intravenous contrast. CONTRAST:  69mL OMNIPAQUE IOHEXOL 300 MG/ML SOLN, 34mL OMNIPAQUE IOHEXOL 300 MG/ML SOLN COMPARISON:  CT of the abdomen pelvis 01/03/2018 FINDINGS: Lower chest: No acute abnormality. Hepatobiliary: No focal liver abnormality is seen. No gallstones, gallbladder wall thickening, or biliary dilatation. Pancreas: There is mild dilation of the pancreatic duct without an obstructing lesion. Calcifications are present. No mass lesion is present. Spleen: Normal in size without focal abnormality. Adrenals/Urinary Tract: The adrenal glands are normal bilaterally. Kidneys are atrophic. No stone or mass lesion is present. Ureters are within normal limits. The urinary bladder is normal. Stomach/Bowel: A large hiatal hernia is present. Stomach is mildly dilated. Duodenum is unremarkable. A proximal small bowel obstruction is present. Transition point is in the left lower quadrant. This is adjacent to the area where the previous diverticular abscess was present. There is no focal collection. There is abrupt narrowing of the small bowel. The more distal small bowel is collapsed. The colon is mostly collapsed. Diverticular changes are noted within the distal descending and  sigmoid colon without focal inflammation Vascular/Lymphatic: Atherosclerotic changes are noted in the aorta without aneurysm. No significant retroperitoneal adenopathy is present. Reproductive: Status post hysterectomy. No adnexal masses. Other: A small amount of free fluid is present adjacent to the distal obstructed small bowel. There is no free air. No significant ventral hernias are present. Musculoskeletal: Levoconvex scoliosis is present in the  lumbar spine. Associated degenerative changes are present. No focal lytic or blastic lesions are present. Bony pelvis is intact. The hips are located. Degenerative changes are worse on the right. IMPRESSION: 1. Small bowel obstruction. Transition point is in the left lower quadrant. This is adjacent to the area of the remote diverticular abscess. This most likely is related to adhesions. Although no discrete mass lesion is present, small bowel neoplasm is also considered. 2. Distal descending and sigmoid diverticulosis without diverticulitis. No residual abscess or fluid collection. 3.  Aortic Atherosclerosis (ICD10-I70.0). 4. Scoliosis and degenerative changes of the lumbar spine. Electronically Signed   By: San Morelle M.D.   On: 12/02/2018 04:32    Pending Labs Unresulted Labs (From admission, onward)    Start     Ordered   12/02/18 0540  SARS Coronavirus 2 (CEPHEID - Performed in [facility] hospital lab), [facility] Order  Once,   STAT    Question:  Rule Out  Answer:  Yes   12/02/18 0540   Signed and Held  Procalcitonin - Baseline  ONCE - STAT,   STAT     Signed and Held   Signed and Held  Procalcitonin  Daily,   R     Signed and Held   Signed and Held  Lactic acid, plasma  STAT Now then every 3 hours,   R     Signed and Held   Signed and Held  CBC  (enoxaparin (LOVENOX)    CrCl >/= 30 ml/min)  Once,   R    Comments: Baseline for enoxaparin therapy IF NOT ALREADY DRAWN.  Notify MD if PLT < 100 K.    Signed and Held   Signed and Held   Creatinine, serum  (enoxaparin (LOVENOX)    CrCl >/= 30 ml/min)  Once,   R    Comments: Baseline for enoxaparin therapy IF NOT ALREADY DRAWN.    Signed and Held   Signed and Held  Creatinine, serum  (enoxaparin (LOVENOX)    CrCl >/= 30 ml/min)  Weekly,   R    Comments: while on enoxaparin therapy    Signed and Held   Signed and Held  Basic metabolic panel  Daily,   R     Signed and Held   Signed and Held  CBC  Daily,   R     Signed and Held   Signed and Held  Magnesium  Daily,   R     Signed and Held   Signed and Held  Hemoglobin A1c  Once,   R     Signed and Held          Vitals/Pain Today's Vitals   12/02/18 0027 12/02/18 0029 12/02/18 0432 12/02/18 0530  BP:  139/68 139/63 (!) 115/59  Pulse:  90 (!) 109 (!) 103  Resp:  17 (!) 22 16  Temp:  97.8 F (36.6 C)    TempSrc:  Oral    SpO2:  99% 96% 96%  Weight: 40.8 kg     Height: 4\' 11"  (1.499 m)     PainSc:        Isolation Precautions No active isolations  Medications Medications  piperacillin-tazobactam (ZOSYN) IVPB 3.375 g (3.375 g Intravenous New Bag/Given 12/02/18 0632)  piperacillin-tazobactam (ZOSYN) IVPB 3.375 g (has no administration in time range)  ondansetron (ZOFRAN) injection 4 mg (4 mg Intravenous Given 12/02/18 0154)  sodium chloride 0.9 % bolus 1,000 mL (0 mLs Intravenous Stopped 12/02/18 0435)  morphine 4 MG/ML  injection 4 mg (4 mg Intravenous Given 12/02/18 0154)  iohexol (OMNIPAQUE) 300 MG/ML solution 75 mL (75 mLs Intravenous Contrast Given 12/02/18 0339)  iohexol (OMNIPAQUE) 300 MG/ML solution 30 mL (30 mLs Oral Contrast Given 12/02/18 0340)  morphine 4 MG/ML injection 4 mg (4 mg Intravenous Given 12/02/18 0442)  prochlorperazine (COMPAZINE) injection 10 mg (10 mg Intravenous Given 12/02/18 0441)    Mobility walks

## 2018-12-02 NOTE — H&P (Addendum)
History and Physical    [name] VCB:[id number] DOB: 12/10/1928 DOA: 12/02/2018  PCP: Crist Infante, MD  Patient coming from: Home  I have personally briefly reviewed patient's old medical records in [place]  Chief Complaint: Abdominal Pain, Nausea and Vomiting  HPI: [name] is a 85 y.o. female with medical history significant of essential hypertension, type 2 diabetes mellitus, HLD, GERD, anxiety, and history of diverticulitis with abscess presents with nausea, vomiting and progressive abdominal pain.  Patient reports onset of symptoms last night roughly 7 PM.  She was attempting to use the restroom and could not move her bowels.  She tried once again later that evening, with once again no desired effect.  She also reports significant nausea associated with vomiting, denies any hematemesis.  Pain is localized to bilateral lower quadrants and is crampy in nature.  Denies any radiation.  No other complaints or concerns at this time.  Denies headache, no fever/chills/night sweats, no diarrhea, no chest pain, no palpitations, no shortness of breath, no cough/congestion, no weakness, no paresthesias.  ED Course: Temperature 97.8, HR 109, RR 22, BP 139/63, SPO2 96% on room air.  WBC count 24.5, hemoglobin 15.0, platelets 254.  Sodium 139, potassium 4.0, chloride 104, CO2 23, BUN 25, creatinine 0.9, glucose 209.  Lipase 48.  ALT 19, AST 20, total bilirubin 0.6.  CT abdomen/pelvis notable for small bowel obstruction with transition point in the left lower quadrant adjacent to remote diverticular abscess.  Descending/sigmoid diverticulosis without diverticulitis.  No appreciable abscess noted on CT.  Patient was given 1 L normal saline bolus and morphine for her abdominal pain.  She was referred for admission by ED provider for small bowel obstruction.  Review of Systems: As per HPI otherwise 10 point review of systems negative.    Past Medical History:  Diagnosis Date  . Breast cancer  ([place])   . Diverticulosis   . Hiatal hernia   . Hypercholesteremia   . Hypertension   . Osteopenia   . Osteoporosis     Past Surgical History:  Procedure Laterality Date  . ABDOMINAL HYSTERECTOMY    . IR RADIOLOGIST EVAL & MGMT  01/03/2018  . IR RADIOLOGIST EVAL & MGMT  01/31/2018  . IR RADIOLOGIST EVAL & MGMT  02/27/2018  . IR RADIOLOGIST EVAL & MGMT  03/20/2018  . IR RADIOLOGIST EVAL & MGMT  04/03/2018  . MASTECTOMY  1999   LEFT     reports that she has never smoked. She has never used smokeless tobacco. No history on file for alcohol and drug.  No Known Allergies  Family History  Problem Relation Age of Onset  . Arthritis Father   . Stroke Father   . Hypertension Mother     Prior to Admission medications   Medication Sig Start Date End Date Taking? Authorizing Provider  ALPRAZolam Duanne Moron) 0.25 MG tablet Take 0.25 mg by mouth at bedtime as needed for anxiety or sleep.    Yes [provider]  amLODipine (NORVASC) 5 MG tablet Take 1 tablet (5 mg total) by mouth daily. Patient taking differently: Take 2.5 mg by mouth daily.  12/29/17  Yes Regalado, Belkys A, MD  Ascorbic Acid (VITAMIN C) 500 MG tablet Take 500 mg by mouth daily.     Yes [provider]  aspirin 325 MG EC tablet Take 325 mg by mouth daily.   Yes [provider]  Cholecalciferol (VITAMIN D) 1000 UNITS capsule Take 1,000 Units by mouth daily.  Yes [provider]  Coenzyme Q10 (CO Q-10) 200 MG CAPS Take 1 capsule by mouth daily.    Yes [provider]  ezetimibe (ZETIA) 10 MG tablet Take 10 mg by mouth daily. 02/26/16  Yes [provider]  metoprolol succinate (TOPROL-XL) 25 MG 24 hr tablet TAKE 1/2 TABLET BY MOUTH DAILY Patient taking differently: Take 12.5 mg by mouth daily.  10/01/17  Yes Nahser, Wonda Cheng, MD  Multiple Vitamin (MULTIVITAMIN) tablet Take 1 tablet by mouth daily.     Yes [provider]  omeprazole (PRILOSEC) 20 MG capsule Take 1 capsule  (20 mg total) by mouth daily. 05/27/15  Yes Darlin Coco, MD  Psyllium (METAMUCIL PO) Take 1 Dose by mouth at bedtime. Mix one teaspoon with 8oz of water.   Yes [provider]  raloxifene (EVISTA) 60 MG tablet TAKE 1 TABLET BY MOUTH ONCE DAILY Patient taking differently: Take 60 mg by mouth daily.  12/23/14  Yes Darlin Coco, MD  vitamin B-12 (CYANOCOBALAMIN) 500 MCG tablet Take 500 mcg by mouth daily.    Yes [provider]  aspirin EC 81 MG tablet Take 1 tablet (81 mg total) by mouth daily. Patient not taking: Reported on 12/02/2018 10/18/16   Richardson Dopp T, PA-C  metFORMIN (GLUCOPHAGE) 500 MG tablet Take 1 tablet (500 mg total) by mouth 2 (two) times daily with a meal. Patient not taking: Reported on 12/02/2018 12/28/17 12/28/18  Regalado, Jerald Kief A, MD  saccharomyces boulardii (FLORASTOR) 250 MG capsule Take 1 capsule (250 mg total) by mouth 2 (two) times daily. Patient not taking: Reported on 12/02/2018 12/28/17   Regalado, Jerald Kief A, MD  sodium chloride flush (NS) 0.9 % SOLN Inject 10 mLs into the vein as needed (flush with 5cc daily). Patient not taking: Reported on 12/02/2018 12/28/17   Saverio Danker, PA-C    Physical Exam: Vitals:   12/02/18 0027 12/02/18 0029 12/02/18 0432 12/02/18 0530  BP:  139/68 139/63 (!) 115/59  Pulse:  90 (!) 109 (!) 103  Resp:  17 (!) 22 16  Temp:  97.8 F (36.6 C)    TempSrc:  Oral    SpO2:  99% 96% 96%  Weight: 40.8 kg     Height: 4\' 11"  (1.499 m)       Constitutional: NAD, calm, comfortable Vitals:   12/02/18 0027 12/02/18 0029 12/02/18 0432 12/02/18 0530  BP:  139/68 139/63 (!) 115/59  Pulse:  90 (!) 109 (!) 103  Resp:  17 (!) 22 16  Temp:  97.8 F (36.6 C)    TempSrc:  Oral    SpO2:  99% 96% 96%  Weight: 40.8 kg     Height: 4\' 11"  (1.499 m)      Eyes: PERRL, lids and conjunctivae normal ENMT: Mucous membranes are moist. Posterior pharynx clear of any exudate or lesions.Normal dentition.  Neck: normal, supple, no masses,  no thyromegaly Respiratory: clear to auscultation bilaterally, no wheezing, no crackles. Normal respiratory effort. No accessory muscle use.  Cardiovascular: Tachycardic, regular rhythm, no murmurs / rubs / gallops. No extremity edema. 2+ pedal pulses. No carotid bruits.  Abdomen: Mild/moderate tenderness to palpation bilateral lower quadrants, no rebound/guarding/masses, mild abdominal distention, very faint bowel sounds appreciated Musculoskeletal: no clubbing / cyanosis. No joint deformity upper and lower extremities. Good ROM, no contractures. Normal muscle tone.  Skin: no rashes, lesions, ulcers. No induration Neurologic: CN 2-12 grossly intact. Sensation intact, DTR normal. Strength 5/5 in all 4.  Psychiatric: Normal judgment and insight.  Alert and oriented x 3. Normal mood.    Labs on Admission: I have personally reviewed following labs and imaging studies  CBC: Recent Labs  Lab 12/02/18 0138  WBC 24.5*  NEUTROABS 22.0*  HGB 15.0  HCT 45.9  MCV 91.4  PLT 914   Basic Metabolic Panel: Recent Labs  Lab 12/02/18 0138  NA 139  K 4.0  CL 104  CO2 23  GLUCOSE 209*  BUN 25*  CREATININE 0.89  CALCIUM 9.5   GFR: Estimated Creatinine Clearance: 28.7 mL/min (by C-G formula based on SCr of 0.89 mg/dL). Liver Function Tests: Recent Labs  Lab 12/02/18 0138  AST 20  ALT 19  ALKPHOS 49  BILITOT 0.6  PROT 7.9  ALBUMIN 4.5   Recent Labs  Lab 12/02/18 0138  LIPASE 48   No results for input(s): AMMONIA in the last 168 hours. Coagulation Profile: No results for input(s): INR, PROTIME in the last 168 hours. Cardiac Enzymes: No results for input(s): CKTOTAL, CKMB, CKMBINDEX, TROPONINI in the last 168 hours. BNP (last 3 results) No results for input(s): PROBNP in the last 8760 hours. HbA1C: No results for input(s): HGBA1C in the last 72 hours. CBG: No results for input(s): GLUCAP in the last 168 hours. Lipid Profile: No results for input(s): CHOL, HDL, LDLCALC, TRIG,  CHOLHDL, LDLDIRECT in the last 72 hours. Thyroid Function Tests: No results for input(s): TSH, T4TOTAL, FREET4, T3FREE, THYROIDAB in the last 72 hours. Anemia Panel: No results for input(s): VITAMINB12, FOLATE, FERRITIN, TIBC, IRON, RETICCTPCT in the last 72 hours. Urine analysis:    Component Value Date/Time   COLORURINE STRAW (A) 12/19/2017 1854   APPEARANCEUR CLEAR 12/19/2017 1854   LABSPEC 1.013 12/19/2017 1854   PHURINE 8.0 12/19/2017 1854   GLUCOSEU 50 (A) 12/19/2017 1854   HGBUR NEGATIVE 12/19/2017 1854   BILIRUBINUR NEGATIVE 12/19/2017 [place] 12/19/2017 1854   PROTEINUR NEGATIVE 12/19/2017 1854   NITRITE NEGATIVE 12/19/2017 1854   LEUKOCYTESUR SMALL (A) 12/19/2017 1854    Radiological Exams on Admission: Ct Abdomen Pelvis W Contrast  Result Date: 12/02/2018 CLINICAL DATA:  Abdominal pain, diverticulitis suspected. Right and left lower quadrant abdominal pain for 6 hours. Nausea and vomiting. EXAM: CT ABDOMEN AND PELVIS WITH CONTRAST TECHNIQUE: Multidetector CT imaging of the abdomen and pelvis was performed using the standard protocol following bolus administration of intravenous contrast. CONTRAST:  11mL OMNIPAQUE IOHEXOL 300 MG/ML SOLN, 89mL OMNIPAQUE IOHEXOL 300 MG/ML SOLN COMPARISON:  CT of the abdomen pelvis 01/03/2018 FINDINGS: Lower chest: No acute abnormality. Hepatobiliary: No focal liver abnormality is seen. No gallstones, gallbladder wall thickening, or biliary dilatation. Pancreas: There is mild dilation of the pancreatic duct without an obstructing lesion. Calcifications are present. No mass lesion is present. Spleen: Normal in size without focal abnormality. Adrenals/Urinary Tract: The adrenal glands are normal bilaterally. Kidneys are atrophic. No stone or mass lesion is present. Ureters are within normal limits. The urinary bladder is normal. Stomach/Bowel: A large hiatal hernia is present. Stomach is mildly dilated. Duodenum is unremarkable. A  proximal small bowel obstruction is present. Transition point is in the left lower quadrant. This is adjacent to the area where the previous diverticular abscess was present. There is no focal collection. There is abrupt narrowing of the small bowel. The more distal small bowel is collapsed. The colon is mostly collapsed. Diverticular changes are noted within the distal descending and sigmoid colon without focal inflammation Vascular/Lymphatic: Atherosclerotic changes are noted in the aorta without aneurysm. No significant  retroperitoneal adenopathy is present. Reproductive: Status post hysterectomy. No adnexal masses. Other: A small amount of free fluid is present adjacent to the distal obstructed small bowel. There is no free air. No significant ventral hernias are present. Musculoskeletal: Levoconvex scoliosis is present in the lumbar spine. Associated degenerative changes are present. No focal lytic or blastic lesions are present. Bony pelvis is intact. The hips are located. Degenerative changes are worse on the right. IMPRESSION: 1. Small bowel obstruction. Transition point is in the left lower quadrant. This is adjacent to the area of the remote diverticular abscess. This most likely is related to adhesions. Although no discrete mass lesion is present, small bowel neoplasm is also considered. 2. Distal descending and sigmoid diverticulosis without diverticulitis. No residual abscess or fluid collection. 3.  Aortic Atherosclerosis (ICD10-I70.0). 4. Scoliosis and degenerative changes of the lumbar spine. Electronically Signed   By: San Morelle M.D.   On: 12/02/2018 04:32    Assessment/Plan Principal Problem:   Small bowel obstruction (HCC) Active Problems:   Benign hypertensive heart disease without heart failure   Hypercholesterolemia   Hiatal hernia   GERD (gastroesophageal reflux disease)   Anxiety   Hyperglycemia   Type II diabetes mellitus with renal manifestations (HCC)  Small bowel  obstruction Patient presenting with progressive lower abdominal discomfort associated with nausea and vomiting since last night.  History of diverticulitis with abscess September 2019 status post IR drain and prolonged antibiotic course.  No surgical intervention required at that time.  Patient is afebrile, WBC count is elevated 24.5.  CT abdomen/pelvis notable for small bowel obstruction with transition point to left lower quadrant adjacent to remote diverticular abscess, no intra-abdominal abscess appreciated this time, and no diverticulitis. --Etiology likely adhesions, history of previous hysterectomy, and most recent intra-abdominal abscess 2019 --We will place NG tube to intermittent low wall suction --N.p.o. except for medications --NS at 22mL/hr --Check lactic acid, procalcitonin --Zosyn for antimicrobial coverage given leukocytosis --Repeat KUB in the a.m.; 12/03/2018 --Serial abdominal exams --Encourage early mobilization with PT/OT consultation --Discussed with general surgery this morning, will evaluate for further recommendations  Leukocytosis White blood cell count elevated 24.5.  Patient is afebrile.  No intra-abdominal infectious process appreciated on CT abdomen/pelvis. --Unclear etiology, suspect infectious process versus dehydration --Start Zosyn as above --Check for other etiologies with chest x-ray, urinalysis --Obtain lactic acid and procalcitonin --Repeat CBC in the a.m.  Essential hypertension BP 139/63 on admission, well controlled.  On amlodipine 2.5 mg p.o. daily, metoprolol succinate 12.5 mg daily at home. --Continue metoprolol succinate --Hold amlodipine for now --Monitor BP closely  Type 2 diabetes mellitus Diet controlled at home.  Patient previously on metformin, but has since discontinued.  Last hemoglobin A1c September 2019 7.9.  Glucose elevated to 209 on admission. --Update hemoglobin A1c --Insulin sliding scale for coverage --CBGs every 6 hours while  n.p.o.  HLD Patient on Zetia 10 mg p.o. daily at home.  Will hold while n.p.o.  Anxiety: Continue Xanax 0.25mg  PO qHS    DVT prophylaxis: Lovenox Code Status: Full Code Family Communication: none Disposition Plan: likely home when medically ready; alone with no immediate family present, states has friends for assistance Consults called: none Admission status: Inpatient  Severity of Illness: The appropriate patient status for this patient is INPATIENT. Inpatient status is judged to be reasonable and necessary in order to provide the required intensity of service to ensure the patient's safety. The patient's presenting symptoms, physical exam findings, and initial radiographic and laboratory data  in the context of their chronic comorbidities is felt to place them at high risk for further clinical deterioration. Furthermore, it is not anticipated that the patient will be medically stable for discharge from the hospital within 2 midnights of admission. The following factors support the patient status of inpatient.   " The patient's presenting symptoms include nausea/vomiting, progressive abdominal pain " The worrisome physical exam findings include faint to lack of bowel sounds, abdominal distention, abdominal tenderness on palpation " The initial radiographic and laboratory data are worrisome because of small bowel obstruction on CT abdomen/pelvis, elevated WBC count of 20 4.5K " The chronic co-morbidities include history of diverticulitis with abscess, type 2 diabetes mellitus, hypertension, anxiety, GERD, hyperlipidemia.   * I certify that at the point of admission it is my clinical judgment that the patient will require inpatient hospital care spanning beyond 2 midnights from the point of admission due to high intensity of service, high risk for further deterioration and high frequency of surveillance required.*  Haaris Metallo J [place] DO [facility] Pager [phone]  If 7PM-7AM,  please contact night-coverage [email or web address] Password [facility]  12/02/2018, 6:23 AM

## 2018-12-02 NOTE — Consult Note (Addendum)
[facility] Surgery Consult Note  [name] [birth year]  [id number].    Requesting MD: Eric [place] Chief Complaint: abdominal pain Reason for Consult: SBO   HPI: Pt with hx of 4 cm diverticular abscess 12/19/17.  She had an IR drain placed 12/27/18. She was treated with antibiotics and the drain was removed on 04/03/18.  she presented to the ED on 12/02/18 with pain lower abdomen.    Work up shows she is afebrile, tachycardic with stable BP.  CMP OK glucose and BUN are up.  WBC 24.5.  H/H15/45.9.  CT scan shows SBO with transition point in the LLQ adjacent to the old abscess site.  We are ask to see.  We placed the NG there is about 400 in the canister currently.   ROS: Review of Systems  Constitutional: Negative.   HENT: Negative.   Eyes: Negative.   Respiratory: Negative.   Cardiovascular: Negative.   Gastrointestinal: Positive for abdominal pain (Acute its onset of abdominal pain.), nausea and vomiting. Negative for blood in stool, constipation and diarrhea.  Genitourinary: Negative.   Musculoskeletal: Negative.   Skin: Negative.   Neurological: Negative.   Endo/Heme/Allergies: Negative.   Psychiatric/Behavioral: Negative.     Family History  Problem Relation Age of Onset  . Arthritis Father   . Stroke Father   . Hypertension Mother     Past Medical History:  Diagnosis Date  . Breast cancer ([place])   . Diverticulosis   . Hiatal hernia   . Hypercholesteremia   . Hypertension   . Osteopenia   . Osteoporosis     Past Surgical History:  Procedure Laterality Date  . ABDOMINAL HYSTERECTOMY    . IR RADIOLOGIST EVAL & MGMT  01/03/2018  . IR RADIOLOGIST EVAL & MGMT  01/31/2018  . IR RADIOLOGIST EVAL & MGMT  02/27/2018  . IR RADIOLOGIST EVAL & MGMT  03/20/2018  . IR RADIOLOGIST EVAL & MGMT  04/03/2018  . MASTECTOMY  1999   LEFT    Social History:  reports that she has never smoked. She has never used smokeless tobacco. No history on file for alcohol and  drug.  Allergies: No Known Allergies  Prior to Admission medications   Medication Sig Start Date End Date Taking? Authorizing Provider  ALPRAZolam Duanne Moron) 0.25 MG tablet Take 0.25 mg by mouth at bedtime as needed for anxiety or sleep.    Yes [provider]  amLODipine (NORVASC) 5 MG tablet Take 1 tablet (5 mg total) by mouth daily. Patient taking differently: Take 2.5 mg by mouth daily.  12/29/17  Yes Regalado, Belkys A, MD  Ascorbic Acid (VITAMIN C) 500 MG tablet Take 500 mg by mouth daily.     Yes [provider]  aspirin 325 MG EC tablet Take 325 mg by mouth daily.   Yes [provider]  Cholecalciferol (VITAMIN D) 1000 UNITS capsule Take 1,000 Units by mouth daily.     Yes [provider]  Coenzyme Q10 (CO Q-10) 200 MG CAPS Take 1 capsule by mouth daily.    Yes [provider]  ezetimibe (ZETIA) 10 MG tablet Take 10 mg by mouth daily. 02/26/16  Yes [provider]  metoprolol succinate (TOPROL-XL) 25 MG 24 hr tablet TAKE 1/2 TABLET BY MOUTH DAILY Patient taking differently: Take 12.5 mg by mouth daily.  10/01/17  Yes Nahser, Wonda Cheng, MD  Multiple Vitamin (MULTIVITAMIN) tablet Take 1 tablet by mouth daily.     Yes [provider]  omeprazole (  PRILOSEC) 20 MG capsule Take 1 capsule (20 mg total) by mouth daily. 05/27/15  Yes Darlin Coco, MD  Psyllium (METAMUCIL PO) Take 1 Dose by mouth at bedtime. Mix one teaspoon with 8oz of water.   Yes [provider]  raloxifene (EVISTA) 60 MG tablet TAKE 1 TABLET BY MOUTH ONCE DAILY Patient taking differently: Take 60 mg by mouth daily.  12/23/14  Yes Darlin Coco, MD  vitamin B-12 (CYANOCOBALAMIN) 500 MCG tablet Take 500 mcg by mouth daily.    Yes [provider]  aspirin EC 81 MG tablet Take 1 tablet (81 mg total) by mouth daily. Patient not taking: Reported on 12/02/2018 10/18/16   Richardson Dopp T, PA-C  metFORMIN (GLUCOPHAGE) 500 MG tablet Take 1 tablet (500 mg  total) by mouth 2 (two) times daily with a meal. Patient not taking: Reported on 12/02/2018 12/28/17 12/28/18  Regalado, Jerald Kief A, MD  saccharomyces boulardii (FLORASTOR) 250 MG capsule Take 1 capsule (250 mg total) by mouth 2 (two) times daily. Patient not taking: Reported on 12/02/2018 12/28/17   Regalado, Jerald Kief A, MD  sodium chloride flush (NS) 0.9 % SOLN Inject 10 mLs into the vein as needed (flush with 5cc daily). Patient not taking: Reported on 12/02/2018 12/28/17   Saverio Danker, PA-C     Blood pressure 104/61, pulse (!) 108, temperature 97.8 F (36.6 C), temperature source Oral, resp. rate 19, height 4\' 11"  (1.499 m), weight 40.8 kg, SpO2 94 %. Physical Exam: Physical Exam Constitutional:      General: She is not in acute distress.    Appearance: She is not ill-appearing, toxic-appearing or diaphoretic.     Comments: Thin frail elderly female in no acute distress.  HENT:     Head: Normocephalic and atraumatic.  Eyes:     Comments: Pupils are equal  Cardiovascular:     Rate and Rhythm: Regular rhythm. Tachycardia present.     Heart sounds: No murmur.  Pulmonary:     Effort: Pulmonary effort is normal. No respiratory distress.     Breath sounds: Normal breath sounds. No stridor. No wheezing or rhonchi.  Abdominal:     General: Abdomen is protuberant. Bowel sounds are absent. There is distension.     Palpations: Abdomen is soft.     Tenderness: There is generalized abdominal tenderness.     Hernia: No hernia is present.  Skin:    General: Skin is warm and dry.     Capillary Refill: Capillary refill takes less than 2 seconds.  Neurological:     General: No focal deficit present.     Mental Status: She is alert and oriented to person, place, and time.  Psychiatric:        Mood and Affect: Mood is anxious and depressed.        Behavior: Behavior normal.     Results for orders placed or performed during the hospital encounter of 12/02/18 (from the past 48 hour(s))  Comprehensive  metabolic panel     Status: Abnormal   Collection Time: 12/02/18  1:38 AM  Result Value Ref Range   Sodium 139 135 - 145 mmol/L   Potassium 4.0 3.5 - 5.1 mmol/L   Chloride 104 98 - 111 mmol/L   CO2 23 22 - 32 mmol/L   Glucose, Bld 209 (H) 70 - 99 mg/dL   BUN 25 (H) 8 - 23 mg/dL   Creatinine, Ser 0.89 0.44 - 1.00 mg/dL   Calcium 9.5 8.9 - 10.3 mg/dL   Total  Protein 7.9 6.5 - 8.1 g/dL   Albumin 4.5 3.5 - 5.0 g/dL   AST 20 15 - 41 U/L   ALT 19 0 - 44 U/L   Alkaline Phosphatase 49 38 - 126 U/L   Total Bilirubin 0.6 0.3 - 1.2 mg/dL   GFR calc non Af Amer 58 (L) >60 mL/min   GFR calc Af Amer >60 >60 mL/min   Anion gap 12 5 - 15    Comment: Performed at [facility], [place] [address] [place], [place] [address]  Lipase, blood     Status: None   Collection Time: 12/02/18  1:38 AM  Result Value Ref Range   Lipase 48 11 - 51 U/L    Comment: Performed at [facility], [place] [address], [place] [address]  CBC with Differential     Status: Abnormal   Collection Time: 12/02/18  1:38 AM  Result Value Ref Range   WBC 24.5 (H) 4.0 - 10.5 K/uL   RBC 5.02 3.87 - 5.11 MIL/uL   Hemoglobin 15.0 12.0 - 15.0 g/dL   HCT 45.9 36.0 - 46.0 %   MCV 91.4 80.0 - 100.0 fL   MCH 29.9 26.0 - 34.0 pg   MCHC 32.7 30.0 - 36.0 g/dL   RDW 13.3 11.5 - 15.5 %   Platelets 254 150 - 400 K/uL   nRBC 0.0 0.0 - 0.2 %   Neutrophils Relative % 90 %   Neutro Abs 22.0 (H) 1.7 - 7.7 K/uL   Lymphocytes Relative 5 %   Lymphs Abs 1.2 0.7 - 4.0 K/uL   Monocytes Relative 5 %   Monocytes Absolute 1.2 (H) 0.1 - 1.0 K/uL   Eosinophils Relative 0 %   Eosinophils Absolute 0.0 0.0 - 0.5 K/uL   Basophils Relative 0 %   Basophils Absolute 0.1 0.0 - 0.1 K/uL   Immature Granulocytes 0 %   Abs Immature Granulocytes 0.11 (H) 0.00 - 0.07 K/uL    Comment: Performed at [facility], [place] [address], [place], [place] [address]  Urinalysis, Routine w reflex microscopic      Status: Abnormal   Collection Time: 12/02/18  1:38 AM  Result Value Ref Range   Color, Urine YELLOW YELLOW   APPearance CLEAR CLEAR   Specific Gravity, Urine 1.031 (H) 1.005 - 1.030   pH 7.0 5.0 - 8.0   Glucose, UA 50 (A) NEGATIVE mg/dL   Hgb urine dipstick NEGATIVE NEGATIVE   Bilirubin Urine NEGATIVE NEGATIVE   Ketones, ur 5 (A) NEGATIVE mg/dL   Protein, ur NEGATIVE NEGATIVE mg/dL   Nitrite NEGATIVE NEGATIVE   Leukocytes,Ua NEGATIVE NEGATIVE    Comment: Performed at [place] [address] [place], [place] [address]  SARS Coronavirus 2 (CEPHEID - Performed in [facility] hospital lab), Hosp Order     Status: None   Collection Time: 12/02/18  5:40 AM   Specimen: Nasopharyngeal Swab  Result Value Ref Range   SARS Coronavirus 2 NEGATIVE NEGATIVE    Comment: (NOTE) If result is NEGATIVE SARS-CoV-2 target nucleic acids are NOT DETECTED. The SARS-CoV-2 RNA is generally detectable in upper and lower  respiratory specimens during the acute phase of infection. The lowest  concentration of SARS-CoV-2 viral copies this assay can detect is 250  copies / mL. A negative result does not preclude SARS-CoV-2 infection  and should not be used as the sole basis for treatment or other  patient management decisions.  A negative  result may occur with  improper specimen collection / handling, submission of specimen other  than nasopharyngeal swab, presence of viral mutation(s) within the  areas targeted by this assay, and inadequate number of viral copies  (<250 copies / mL). A negative result must be combined with clinical  observations, patient history, and epidemiological information. If result is POSITIVE SARS-CoV-2 target nucleic acids are DETECTED. The SARS-CoV-2 RNA is generally detectable in upper and lower  respiratory specimens dur ing the acute phase of infection.  Positive  results are indicative of active infection with SARS-CoV-2.  Clinical  correlation with  patient history and other diagnostic information is  necessary to determine patient infection status.  Positive results do  not rule out bacterial infection or co-infection with other viruses. If result is PRESUMPTIVE POSTIVE SARS-CoV-2 nucleic acids MAY BE PRESENT.   A presumptive positive result was obtained on the submitted specimen  and confirmed on repeat testing.  While 2019 novel coronavirus  (SARS-CoV-2) nucleic acids may be present in the submitted sample  additional confirmatory testing may be necessary for epidemiological  and / or clinical management purposes  to differentiate between  SARS-CoV-2 and other Sarbecovirus currently known to infect humans.  If clinically indicated additional testing with an alternate test  methodology [phone]) is advised. The SARS-CoV-2 RNA is generally  detectable in upper and lower respiratory sp ecimens during the acute  phase of infection. The expected result is Negative. Fact Sheet for Patients:  StrictlyIdeas.no Fact Sheet for Healthcare Providers: BankingDealers.co.za This test is not yet approved or cleared by the [place] FDA and has been authorized for detection and/or diagnosis of SARS-CoV-2 by FDA under an Emergency Use Authorization (EUA).  This EUA will remain in effect (meaning this test can be used) for the duration of the COVID-19 declaration under Section 564(b)(1) of the Act, 21 U.S.C. section 360bbb-3(b)(1), unless the authorization is terminated or revoked sooner. Performed at [facility], [place] [address] [place], [place] [address]    Ct Abdomen Pelvis W Contrast  Result Date: 12/02/2018 CLINICAL DATA:  Abdominal pain, diverticulitis suspected. Right and left lower quadrant abdominal pain for 6 hours. Nausea and vomiting. EXAM: CT ABDOMEN AND PELVIS WITH CONTRAST TECHNIQUE: Multidetector CT imaging of the abdomen and pelvis was performed using the  standard protocol following bolus administration of intravenous contrast. CONTRAST:  76mL OMNIPAQUE IOHEXOL 300 MG/ML SOLN, 107mL OMNIPAQUE IOHEXOL 300 MG/ML SOLN COMPARISON:  CT of the abdomen pelvis 01/03/2018 FINDINGS: Lower chest: No acute abnormality. Hepatobiliary: No focal liver abnormality is seen. No gallstones, gallbladder wall thickening, or biliary dilatation. Pancreas: There is mild dilation of the pancreatic duct without an obstructing lesion. Calcifications are present. No mass lesion is present. Spleen: Normal in size without focal abnormality. Adrenals/Urinary Tract: The adrenal glands are normal bilaterally. Kidneys are atrophic. No stone or mass lesion is present. Ureters are within normal limits. The urinary bladder is normal. Stomach/Bowel: A large hiatal hernia is present. Stomach is mildly dilated. Duodenum is unremarkable. A proximal small bowel obstruction is present. Transition point is in the left lower quadrant. This is adjacent to the area where the previous diverticular abscess was present. There is no focal collection. There is abrupt narrowing of the small bowel. The more distal small bowel is collapsed. The colon is mostly collapsed. Diverticular changes are noted within the distal descending and sigmoid colon without focal inflammation Vascular/Lymphatic: Atherosclerotic changes are noted in the aorta without aneurysm. No significant retroperitoneal adenopathy is present. Reproductive:  Status post hysterectomy. No adnexal masses. Other: A small amount of free fluid is present adjacent to the distal obstructed small bowel. There is no free air. No significant ventral hernias are present. Musculoskeletal: Levoconvex scoliosis is present in the lumbar spine. Associated degenerative changes are present. No focal lytic or blastic lesions are present. Bony pelvis is intact. The hips are located. Degenerative changes are worse on the right. IMPRESSION: 1. Small bowel obstruction. Transition  point is in the left lower quadrant. This is adjacent to the area of the remote diverticular abscess. This most likely is related to adhesions. Although no discrete mass lesion is present, small bowel neoplasm is also considered. 2. Distal descending and sigmoid diverticulosis without diverticulitis. No residual abscess or fluid collection. 3.  Aortic Atherosclerosis (ICD10-I70.0). 4. Scoliosis and degenerative changes of the lumbar spine. Electronically Signed   By: San Morelle M.D.   On: 12/02/2018 04:32      Assessment/Plan Hypertension Type 2 diabetes GERD Hyperlipidemia COVID negative  Small bowel obstruction transition point left lower quadrant adjacent to prior diverticular abscess Diverticular abscess with IR drain placement 12/26/2017   FEN: N.p.o./IV fluids ID: None DVT: SCDs Follow-up: To be determined POC: Myra Gianotti Friend [phone]     Plan: We placed an NG and so far she is got about 400 out from the NG.  A film is pending.  After she has had a couple hours of NG drainage we will plan to start the small bowel protocol.  We would recommend ongoing NG decompression, IV fluid hydration, I will give her, her beta-blocker IV for now.  [facility] Surgery Pager:  [phone]    12/02/2018 7:28 AM

## 2018-12-02 NOTE — Evaluation (Signed)
Physical Therapy Evaluation Patient Details Name: [name] MRN: [id number] DOB: 23-Jan-1929 Today's Date: 12/02/2018   History of Present Illness  85 yo female admitted with SBO. Hx of diverticular abscess, fibromyalgia, osteoporosis, breast cancer, s/p mastectomy  Clinical Impression  On eval, pt required Min assist for mobility. She was able to stand and take a few side steps along the bedside with 1 HHA. Deferred ambulation 2* NG tube recently placed (did not want to disconnect). Will follow and progress activity as tolerated.     Follow Up Recommendations Home health PT;Supervision - Intermittent    Equipment Recommendations  None recommended by PT    Recommendations for Other Services       Precautions / Restrictions Precautions Precautions: Fall Precaution Comments: NG tube Restrictions Weight Bearing Restrictions: No      Mobility  Bed Mobility Overal bed mobility: Needs Assistance Bed Mobility: Supine to Sit;Sit to Supine     Supine to sit: Supervision;HOB elevated Sit to supine: Supervision;HOB elevated   General bed mobility comments: for safety, lines  Transfers Overall transfer level: Needs assistance Equipment used: 1 person hand held assist Transfers: Sit to/from Stand Sit to Stand: Min assist         General transfer comment: Assist to rise, stabilize, control descent. VCs safety, hand placement  Ambulation/Gait Ambulation/Gait assistance: Min assist           General Gait Details: side steps along side of bed with 1 HHA. Unsteady. Deferred ambulation since NG tube just recently placed  Stairs            Wheelchair Mobility    Modified Rankin (Stroke Patients Only)       Balance Overall balance assessment: Needs assistance         Standing balance support: Single extremity supported Standing balance-Leahy Scale: Poor                               Pertinent Vitals/Pain Pain Assessment: Faces Faces Pain  Scale: Hurts a little bit Pain Location: abdomen Pain Descriptors / Indicators: Sore Pain Intervention(s): Limited activity within patient's tolerance    Home Living Family/patient expects to be discharged to:: Private residence Living Arrangements: Alone   Type of Home: House Home Access: Stairs to enter Entrance Stairs-Rails: Right Entrance Stairs-Number of Steps: 3 Home Layout: One level        Prior Function Level of Independence: Independent         Comments: AMB independently, no mobility restrictions, drives, grocery shopping.      Hand Dominance        Extremity/Trunk Assessment   Upper Extremity Assessment Upper Extremity Assessment: Generalized weakness    Lower Extremity Assessment Lower Extremity Assessment: Generalized weakness    Cervical / Trunk Assessment Cervical / Trunk Assessment: Kyphotic  Communication   Communication: No difficulties  Cognition Arousal/Alertness: Awake/alert Behavior During Therapy: WFL for tasks assessed/performed Overall Cognitive Status: Within Functional Limits for tasks assessed                                        General Comments      Exercises     Assessment/Plan    PT Assessment Patient needs continued PT services  PT Problem List Decreased strength;Decreased mobility;Decreased activity tolerance;Decreased balance;Decreased knowledge of use of DME  PT Treatment Interventions DME instruction;Gait training;Therapeutic exercise;Therapeutic activities;Patient/family education;Balance training;Functional mobility training    PT Goals (Current goals can be found in the Care Plan section)  Acute Rehab PT Goals Patient Stated Goal: to get better and regain PLOF Time For Goal Achievement: 12/16/18 Potential to Achieve Goals: Good    Frequency Min 3X/week   Barriers to discharge        Co-evaluation               AM-PAC PT "6 Clicks" Mobility  Outcome Measure Help needed  turning from your back to your side while in a flat bed without using bedrails?: A Little Help needed moving from lying on your back to sitting on the side of a flat bed without using bedrails?: A Little Help needed moving to and from a bed to a chair (including a wheelchair)?: A Little Help needed standing up from a chair using your arms (e.g., wheelchair or bedside chair)?: A Little Help needed to walk in hospital room?: A Little Help needed climbing 3-5 steps with a railing? : A Little 6 Click Score: 18    End of Session   Activity Tolerance: Patient limited by fatigue Patient left: in bed;with call bell/phone within reach;with bed alarm set   PT Visit Diagnosis: Unsteadiness on feet (R26.81);Muscle weakness (generalized) (M62.81)    Time: 5956-3875 PT Time Calculation (min) (ACUTE ONLY): 10 min   Charges:   PT Evaluation $PT Eval Moderate Complexity: [place], PT Acute Rehabilitation Services Pager: [phone] Office: [phone]

## 2018-12-02 NOTE — Progress Notes (Signed)
CRITICAL VALUE ALERT  Critical Value: Lactic Acid 4.2  Date & Time Notied: 12/02/18 0946  Provider Notified:Jennifer Maylene Roes  Orders Received/Actions taken:Pending

## 2018-12-03 LAB — BASIC METABOLIC PANEL
Anion gap: 9 (ref 5–15)
BUN: 24 mg/dL — ABNORMAL HIGH (ref 8–23)
CO2: 26 mmol/L (ref 22–32)
Calcium: 8.2 mg/dL — ABNORMAL LOW (ref 8.9–10.3)
Chloride: 106 mmol/L (ref 98–111)
Creatinine, Ser: 1.12 mg/dL — ABNORMAL HIGH (ref 0.44–1.00)
GFR calc Af Amer: 51 mL/min — ABNORMAL LOW (ref >60)
GFR calc non Af Amer: 44 mL/min — ABNORMAL LOW (ref >60)
Glucose, Bld: 161 mg/dL — ABNORMAL HIGH (ref 70–99)
Potassium: 3.6 mmol/L (ref 3.5–5.1)
Sodium: 141 mmol/L (ref 135–145)

## 2018-12-03 LAB — PROCALCITONIN: Procalcitonin: 1.46 ng/mL

## 2018-12-03 LAB — CBC
HCT: 39.4 % (ref 36.0–46.0)
Hemoglobin: 12.3 g/dL (ref 12.0–15.0)
MCH: 28.8 pg (ref 26.0–34.0)
MCHC: 31.2 g/dL (ref 30.0–36.0)
MCV: 92.3 fL (ref 80.0–100.0)
Platelets: 208 10*3/uL (ref 150–400)
RBC: 4.27 MIL/uL (ref 3.87–5.11)
RDW: 14.1 % (ref 11.5–15.5)
WBC: 15.2 10*3/uL — ABNORMAL HIGH (ref 4.0–10.5)
nRBC: 0 % (ref 0.0–0.2)

## 2018-12-03 LAB — MAGNESIUM: Magnesium: 2.1 mg/dL (ref 1.7–2.4)

## 2018-12-03 LAB — GLUCOSE, CAPILLARY
Glucose-Capillary: 109 mg/dL — ABNORMAL HIGH (ref 70–99)
Glucose-Capillary: 117 mg/dL — ABNORMAL HIGH (ref 70–99)
Glucose-Capillary: 126 mg/dL — ABNORMAL HIGH (ref 70–99)
Glucose-Capillary: 143 mg/dL — ABNORMAL HIGH (ref 70–99)

## 2018-12-03 MED ORDER — LIP MEDEX EX OINT
1.0000 "application " | TOPICAL_OINTMENT | CUTANEOUS | Status: DC | PRN
  Filled 2018-12-03: qty 7

## 2018-12-03 NOTE — Progress Notes (Signed)
Physical Therapy Treatment Patient Details Name: [name] MRN: [id number] DOB: 1929-03-11 Today's Date: 12/03/2018    History of Present Illness 85 yo female admitted with SBO. Hx of diverticular abscess, fibromyalgia, osteoporosis, breast cancer, s/p mastectomy    PT Comments    Progressing with mobility.    Follow Up Recommendations  Home health PT;Supervision - Intermittent     Equipment Recommendations  None recommended by PT    Recommendations for Other Services       Precautions / Restrictions Precautions Precautions: Fall Precaution Comments: NG tube Restrictions Weight Bearing Restrictions: No    Mobility  Bed Mobility               General bed mobility comments: oob in recliner  Transfers Overall transfer level: Needs assistance Equipment used: None Transfers: Sit to/from Stand Sit to Stand: Min guard         General transfer comment: close guard for safety.  Ambulation/Gait Ambulation/Gait assistance: Min guard Gait Distance (Feet): 75 Feet Assistive device: IV Pole Gait Pattern/deviations: Step-through pattern;Decreased stride length     General Gait Details: mildly unsteady. slow gait speed. Took over walk from nursing (pt had already walked mostly around the unit).   Stairs             Wheelchair Mobility    Modified Rankin (Stroke Patients Only)       Balance Overall balance assessment: Mild deficits observed, not formally tested                                          Cognition Arousal/Alertness: Awake/alert Behavior During Therapy: WFL for tasks assessed/performed Overall Cognitive Status: Within Functional Limits for tasks assessed                                        Exercises      General Comments        Pertinent Vitals/Pain Pain Assessment: Faces Faces Pain Scale: Hurts a little bit Pain Location: abdomen Pain Descriptors / Indicators: Sore;Discomfort Pain  Intervention(s): Monitored during session    Home Living                      Prior Function            PT Goals (current goals can now be found in the care plan section) Progress towards PT goals: Progressing toward goals    Frequency    Min 3X/week      PT Plan Current plan remains appropriate    Co-evaluation              AM-PAC PT "6 Clicks" Mobility   Outcome Measure  Help needed turning from your back to your side while in a flat bed without using bedrails?: A Little Help needed moving from lying on your back to sitting on the side of a flat bed without using bedrails?: A Little Help needed moving to and from a bed to a chair (including a wheelchair)?: A Little Help needed standing up from a chair using your arms (e.g., wheelchair or bedside chair)?: A Little Help needed to walk in hospital room?: A Little Help needed climbing 3-5 steps with a railing? : A Little 6 Click Score: 18    End of Session  Activity Tolerance: Patient tolerated treatment well Patient left: in chair;with call bell/phone within reach   PT Visit Diagnosis: Unsteadiness on feet (R26.81);Muscle weakness (generalized) (M62.81)     Time: 9483-4758 PT Time Calculation (min) (ACUTE ONLY): 8 min  Charges:  $Gait Training: 8-22 mins                       Weston Anna, PT Acute Rehabilitation Services Pager: [phone] Office: [phone]

## 2018-12-03 NOTE — Progress Notes (Signed)
    CC: Abdominal pain  Subjective: Patient still slightly distended on exam.  Pains improved and she feels better.  No flatus or BM so far.  Objective: Vital signs in last 24 hours: Temp:  [98.2 F (36.8 C)-98.8 F (37.1 C)] 98.8 F (37.1 C) (07/07 0517) Pulse Rate:  [93-100] 93 (07/07 0517) Resp:  [16] 16 (07/07 0517) BP: (116-127)/(52-69) 127/69 (07/07 0517) SpO2:  [93 %] 93 % (07/07 0517) Last BM Date: 12/01/18 NPO 1949 IV Urine 1200 NG 1790 No BM Afebrile, VSS Creatinine up to 1.12 K+ 3.6/Mag2.1 WBC:24.5>>15.7>>15.2 8 hr SBP film:Administered enteric contrast has reached the colon, with contrast in the ascending, transverse, and descending colon. Persistent small bowel dilatation in the lower abdomen  Intake/Output from previous day: 07/06 0701 - 07/07 0700 In: 1949.5 [I.V.:844.7; IV Piggyback:1104.8] Out: 2990 [Urine:1200; Emesis/NG output:1790] Intake/Output this shift: No intake/output data recorded.  General appearance: alert, cooperative and no distress Resp: clear to auscultation bilaterally GI: Still slightly distended, bowel sounds hypoactive but present.  No flatus or BM.  Lab Results:  Recent Labs    12/02/18 0848 12/03/18 0331  WBC 15.7* 15.2*  HGB 13.6 12.3  HCT 43.4 39.4  PLT 237 208    BMET Recent Labs    12/02/18 0138 12/02/18 0848 12/03/18 0331  NA 139  --  141  K 4.0  --  3.6  CL 104  --  106  CO2 23  --  26  GLUCOSE 209*  --  161*  BUN 25*  --  24*  CREATININE 0.89 0.99 1.12*  CALCIUM 9.5  --  8.2*   PT/INR No results for input(s): LABPROT, INR in the last 72 hours.  Recent Labs  Lab 12/02/18 0138  AST 20  ALT 19  ALKPHOS 49  BILITOT 0.6  PROT 7.9  ALBUMIN 4.5     Lipase     Component Value Date/Time   LIPASE 48 12/02/2018 0138     Medications: . enoxaparin (LOVENOX) injection  30 mg Subcutaneous Q24H  . metoprolol tartrate  2.5 mg Intravenous Q8H   . sodium chloride 75 mL/hr at 12/02/18 0910  .  piperacillin-tazobactam (ZOSYN)  IV 3.375 g (12/03/18 0536)    Assessment/Plan Hypertension Type 2 diabetes GERD Hyperlipidemia COVID negative Dehydration - creatinine 0.89 >>1.21  Small bowel obstruction transition point left lower quadrant adjacent to prior diverticular abscess/probable recurrent diverticulitis Diverticular abscess with IR drain placement 12/26/2017 - Leukocytosis 24.5>>15.7>>15.2  FEN: N.p.o./IV fluids ID: None DVT: SCDs/Lovenox Follow-up: To be determined POC: Griffin,Ann Friend [phone]   Plan: I irrigated and flushed the NG, replaced the filter.  Still has a fair amount of green bilious fluid coming from her stomach.  We will give her some sips and chips today, continue antibiotics, begin ambulating in the halls. I will leave fluids to Dr. Maylene Roes; recheck labs and film in AM.        LOS: 1 day    Shama Monfils 12/03/2018 [phone]

## 2018-12-03 NOTE — TOC Initial Note (Signed)
Transition of Care [facility]) - Initial/Assessment Note    Patient Details  Name: [name] MRN: [id number] Date of Birth: November 11, 1928  Transition of Care (TOC) CM/SW Contact:    Joaquin Courts, RN Phone Number: 12/03/2018, 9:25 AM  Clinical Narrative:                 CM spoke with patient at bedside who states she is very independent at home and does not feel she needs home health services. States she lives alone and has friends that help her if needed. CM encouraged patient to let her bedside RN know if she changes her mind about HH. Patient denies any dme needs.   Expected Discharge Plan: Home/Self Care Barriers to Discharge: Continued Medical Work up   Patient Goals and CMS Choice Patient states their goals for this hospitalization and ongoing recovery are:: to go home      Expected Discharge Plan and Services Expected Discharge Plan: Home/Self Care   Discharge Planning Services: CM Consult   Living arrangements for the past 2 months: Single Family Home                 DME Arranged: N/A DME Agency: NA       HH Arranged: Refused [place] Agency: NA        Prior Living Arrangements/Services Living arrangements for the past 2 months: Single Family Home Lives with:: Self Patient language and need for interpreter reviewed:: Yes Do you feel safe going back to the place where you live?: Yes      Need for Family Participation in Patient Care: No (Comment) Care giver support system in place?: Yes (comment)   Criminal Activity/Legal Involvement Pertinent to Current Situation/Hospitalization: No - Comment as needed  Activities of Daily Living Home Assistive Devices/Equipment: None ADL Screening (condition at time of admission) Patient's cognitive ability adequate to safely complete daily activities?: Yes Is the patient deaf or have difficulty hearing?: No Does the patient have difficulty seeing, even when wearing glasses/contacts?: No Does the patient have difficulty  concentrating, remembering, or making decisions?: No Patient able to express need for assistance with ADLs?: Yes Does the patient have difficulty dressing or bathing?: No Independently performs ADLs?: Yes (appropriate for developmental age) Does the patient have difficulty walking or climbing stairs?: No Weakness of Legs: None Weakness of Arms/Hands: None  Permission Sought/Granted                  Emotional Assessment Appearance:: Appears stated age Attitude/Demeanor/Rapport: Engaged Affect (typically observed): Accepting Orientation: : Oriented to Self, Oriented to Place, Oriented to  Time, Oriented to Situation   Psych Involvement: No (comment)  Admission diagnosis:  Small bowel obstruction ([place]) [K56.609] Patient Active Problem List   Diagnosis Date Noted  . Small bowel obstruction ([place]) 12/02/2018  . Malnutrition of moderate degree 12/25/2017  . Diverticulitis of large intestine with abscess 12/19/2017  . Sepsis ([place]) 12/19/2017  . HTN (hypertension) 12/19/2017  . History of prediabetes 12/19/2017  . GERD (gastroesophageal reflux disease) 12/19/2017  . Anxiety 12/19/2017  . Acute renal failure superimposed on stage 2 chronic kidney disease ([place]) 12/19/2017  . Type II diabetes mellitus with renal manifestations ([place]) 12/19/2017  . Pancreatic cyst 12/19/2017  . Hyperglycemia   . Aortic systolic murmur on examination 04/08/2015  . Malaise and fatigue 04/15/2012  . Right carotid bruit 10/05/2011  . Hiatal hernia 04/05/2011  . Benign hypertensive heart disease without heart failure 09/01/2010  . Hypercholesterolemia 09/01/2010  .  History of breast cancer 09/01/2010  . Osteopenia 09/01/2010   PCP:  Crist Infante, MD Pharmacy:   [place], [place] [address] [place] [address] Phone: [phone] Fax: [phone]  [facility] - [place], [place] - [address] [place] [address] Phone: [phone] Fax: [phone]  [facility] [place], [place] [address] [place] [address] Phone: [phone] Fax: [phone]  [facility] [place], [place] [address] Phone: [phone] Fax: [phone]     Social Determinants of Health (SDOH) Interventions    Readmission Risk Interventions No flowsheet data found.

## 2018-12-03 NOTE — Progress Notes (Signed)
PROGRESS NOTE    [name]  IOX:[id number] DOB: 09-30-1928 DOA: 12/02/2018 PCP: Leslie Infante, MD     Brief Narrative:  [name] Riceis a 85 y.o.femalewith medical history significant ofessential hypertension, type 2 diabetes mellitus, HLD, GERD, anxiety, and history of diverticulitis with abscess presents with nausea, vomiting and progressive abdominal pain. Patient reports onset of symptoms last night roughly 7 PM. She was attempting to use the restroom and could not move her bowels. She tried once again later that evening, with once again no effect. She also reports significant nausea associated with vomiting. Pain is localized to bilateral lower quadrants and is crampy in nature. CT abdomen/pelvis notable for small bowel obstruction with transition point in the left lower quadrant adjacent to remote diverticular abscess. Descending/sigmoid diverticulosis without diverticulitis. No appreciable abscess noted on CT. Patient was given 1 L normal saline bolus and morphine for her abdominal pain. She was referred for admission by ED provider for small bowel obstruction.  New events last 24 hours / Subjective: NG tube in place, no complaints today.  No nausea or vomiting, passing a little bit of gas but no bowel movement.  Abdominal pain much improved.  Assessment & Plan:   Principal Problem:   Small bowel obstruction (HCC) Active Problems:   Benign hypertensive heart disease without heart failure   Hypercholesterolemia   Hiatal hernia   GERD (gastroesophageal reflux disease)   Anxiety   Hyperglycemia   Type II diabetes mellitus with renal manifestations (HCC)   Small bowel obstruction -Appreciate general surgery -NG tube -N.p.o., ice chips okay -Continue Zosyn for question of diverticulitis given relative transition point in small bowel in the area of where she has had an abscess in the past -Continue IV fluids   Type 2 diabetes -Hold metformin -SSI   Essential  hypertension -Lopressor IV.  Hold Norvasc while n.p.o. Hold Toprol while n.p.o.    DVT prophylaxis: Lovenox  Code Status: Full code Family Communication: None Disposition Plan: Pending further improvement   Consultants:   General surgery  Procedures:   None  Antimicrobials:  Anti-infectives (From admission, onward)   Start     Dose/Rate Route Frequency Ordered Stop   12/02/18 1400  piperacillin-tazobactam (ZOSYN) IVPB 3.375 g     3.375 g 12.5 mL/hr over 240 Minutes Intravenous Every 8 hours 12/02/18 0617     12/02/18 0630  piperacillin-tazobactam (ZOSYN) IVPB 3.375 g     3.375 g 100 mL/hr over 30 Minutes Intravenous  Once 12/02/18 0617 12/02/18 0700        Objective: Vitals:   12/02/18 2141 12/02/18 2209 12/02/18 2212 12/03/18 0517  BP: (!) 120/52 (!) 117/59 (!) 116/58 127/69  Pulse: 95 100  93  Resp: 16   16  Temp: 98.2 F (36.8 C)   98.8 F (37.1 C)  TempSrc: Oral   Oral  SpO2: 93%   93%  Weight:      Height:        Intake/Output Summary (Last 24 hours) at 12/03/2018 1145 Last data filed at 12/03/2018 1000 Gross per 24 hour  Intake 1933.73 ml  Output 2590 ml  Net -656.27 ml   Filed Weights   12/02/18 0027  Weight: 40.8 kg    Examination:  General exam: Appears calm and comfortable  Respiratory system: Clear to auscultation. Respiratory effort normal. Cardiovascular system: S1 & S2 heard, RRR. No JVD, murmurs, rubs, gallops or clicks. No pedal edema. Gastrointestinal system: Abdomen is nondistended, soft and nontender.  Diminished bowel  sounds heard Central nervous system: Alert and oriented. No focal neurological deficits. Extremities: Symmetric 5 x 5 power. Skin: No rashes, lesions or ulcers Psychiatry: Judgement and insight appear normal. Mood & affect appropriate.   Data Reviewed: I have personally reviewed following labs and imaging studies  CBC: Recent Labs  Lab 12/02/18 0138 12/02/18 0848 12/03/18 0331  WBC 24.5* 15.7* 15.2*    NEUTROABS 22.0*  --   --   HGB 15.0 13.6 12.3  HCT 45.9 43.4 39.4  MCV 91.4 92.5 92.3  PLT 254 237 902   Basic Metabolic Panel: Recent Labs  Lab 12/02/18 0138 12/02/18 0848 12/03/18 0331  NA 139  --  141  K 4.0  --  3.6  CL 104  --  106  CO2 23  --  26  GLUCOSE 209*  --  161*  BUN 25*  --  24*  CREATININE 0.89 0.99 1.12*  CALCIUM 9.5  --  8.2*  MG  --   --  2.1   GFR: Estimated Creatinine Clearance: 22.8 mL/min (A) (by C-G formula based on SCr of 1.12 mg/dL (H)). Liver Function Tests: Recent Labs  Lab 12/02/18 0138  AST 20  ALT 19  ALKPHOS 49  BILITOT 0.6  PROT 7.9  ALBUMIN 4.5   Recent Labs  Lab 12/02/18 0138  LIPASE 48   No results for input(s): AMMONIA in the last 168 hours. Coagulation Profile: No results for input(s): INR, PROTIME in the last 168 hours. Cardiac Enzymes: No results for input(s): CKTOTAL, CKMB, CKMBINDEX, TROPONINI in the last 168 hours. BNP (last 3 results) No results for input(s): PROBNP in the last 8760 hours. HbA1C: Recent Labs    12/02/18 0848  HGBA1C 6.7*   CBG: Recent Labs  Lab 12/02/18 1119 12/02/18 2318 12/03/18 0519 12/03/18 1124  GLUCAP 167* 150* 143* 126*   Lipid Profile: No results for input(s): CHOL, HDL, LDLCALC, TRIG, CHOLHDL, LDLDIRECT in the last 72 hours. Thyroid Function Tests: No results for input(s): TSH, T4TOTAL, FREET4, T3FREE, THYROIDAB in the last 72 hours. Anemia Panel: No results for input(s): VITAMINB12, FOLATE, FERRITIN, TIBC, IRON, RETICCTPCT in the last 72 hours. Sepsis Labs: Recent Labs  Lab 12/02/18 0848 12/02/18 1057 12/03/18 0331  PROCALCITON 0.33  --  1.46  LATICACIDVEN 4.2* 4.0*  --     Recent Results (from the past 240 hour(s))  SARS Coronavirus 2 (CEPHEID - Performed in [place] hospital lab), Hosp Order     Status: None   Collection Time: 12/02/18  5:40 AM   Specimen: Nasopharyngeal Swab  Result Value Ref Range Status   SARS Coronavirus 2 NEGATIVE NEGATIVE Final     Comment: (NOTE) If result is NEGATIVE SARS-CoV-2 target nucleic acids are NOT DETECTED. The SARS-CoV-2 RNA is generally detectable in upper and lower  respiratory specimens during the acute phase of infection. The lowest  concentration of SARS-CoV-2 viral copies this assay can detect is 250  copies / mL. A negative result does not preclude SARS-CoV-2 infection  and should not be used as the sole basis for treatment or other  patient management decisions.  A negative result may occur with  improper specimen collection / handling, submission of specimen other  than nasopharyngeal swab, presence of viral mutation(s) within the  areas targeted by this assay, and inadequate number of viral copies  (<250 copies / mL). A negative result must be combined with clinical  observations, patient history, and epidemiological information. If result is POSITIVE SARS-CoV-2 target nucleic acids are  DETECTED. The SARS-CoV-2 RNA is generally detectable in upper and lower  respiratory specimens dur ing the acute phase of infection.  Positive  results are indicative of active infection with SARS-CoV-2.  Clinical  correlation with patient history and other diagnostic information is  necessary to determine patient infection status.  Positive results do  not rule out bacterial infection or co-infection with other viruses. If result is PRESUMPTIVE POSTIVE SARS-CoV-2 nucleic acids MAY BE PRESENT.   A presumptive positive result was obtained on the submitted specimen  and confirmed on repeat testing.  While 2019 novel coronavirus  (SARS-CoV-2) nucleic acids may be present in the submitted sample  additional confirmatory testing may be necessary for epidemiological  and / or clinical management purposes  to differentiate between  SARS-CoV-2 and other Sarbecovirus currently known to infect humans.  If clinically indicated additional testing with an alternate test  methodology [phone]) is advised. The SARS-CoV-2  RNA is generally  detectable in upper and lower respiratory sp ecimens during the acute  phase of infection. The expected result is Negative. Fact Sheet for Patients:  StrictlyIdeas.no Fact Sheet for Healthcare Providers: BankingDealers.co.za This test is not yet approved or cleared by the [place] FDA and has been authorized for detection and/or diagnosis of SARS-CoV-2 by FDA under an Emergency Use Authorization (EUA).  This EUA will remain in effect (meaning this test can be used) for the duration of the COVID-19 declaration under Section 564(b)(1) of the Act, 21 U.S.C. section 360bbb-3(b)(1), unless the authorization is terminated or revoked sooner. Performed at [facility], [place] [address] [place], [place] [address]       Radiology Studies: Ct Abdomen Pelvis W Contrast  Result Date: 12/02/2018 CLINICAL DATA:  Abdominal pain, diverticulitis suspected. Right and left lower quadrant abdominal pain for 6 hours. Nausea and vomiting. EXAM: CT ABDOMEN AND PELVIS WITH CONTRAST TECHNIQUE: Multidetector CT imaging of the abdomen and pelvis was performed using the standard protocol following bolus administration of intravenous contrast. CONTRAST:  68mL OMNIPAQUE IOHEXOL 300 MG/ML SOLN, 31mL OMNIPAQUE IOHEXOL 300 MG/ML SOLN COMPARISON:  CT of the abdomen pelvis 01/03/2018 FINDINGS: Lower chest: No acute abnormality. Hepatobiliary: No focal liver abnormality is seen. No gallstones, gallbladder wall thickening, or biliary dilatation. Pancreas: There is mild dilation of the pancreatic duct without an obstructing lesion. Calcifications are present. No mass lesion is present. Spleen: Normal in size without focal abnormality. Adrenals/Urinary Tract: The adrenal glands are normal bilaterally. Kidneys are atrophic. No stone or mass lesion is present. Ureters are within normal limits. The urinary bladder is normal. Stomach/Bowel: A large  hiatal hernia is present. Stomach is mildly dilated. Duodenum is unremarkable. A proximal small bowel obstruction is present. Transition point is in the left lower quadrant. This is adjacent to the area where the previous diverticular abscess was present. There is no focal collection. There is abrupt narrowing of the small bowel. The more distal small bowel is collapsed. The colon is mostly collapsed. Diverticular changes are noted within the distal descending and sigmoid colon without focal inflammation Vascular/Lymphatic: Atherosclerotic changes are noted in the aorta without aneurysm. No significant retroperitoneal adenopathy is present. Reproductive: Status post hysterectomy. No adnexal masses. Other: A small amount of free fluid is present adjacent to the distal obstructed small bowel. There is no free air. No significant ventral hernias are present. Musculoskeletal: Levoconvex scoliosis is present in the lumbar spine. Associated degenerative changes are present. No focal lytic or blastic lesions are present. Bony pelvis is intact. The  hips are located. Degenerative changes are worse on the right. IMPRESSION: 1. Small bowel obstruction. Transition point is in the left lower quadrant. This is adjacent to the area of the remote diverticular abscess. This most likely is related to adhesions. Although no discrete mass lesion is present, small bowel neoplasm is also considered. 2. Distal descending and sigmoid diverticulosis without diverticulitis. No residual abscess or fluid collection. 3.  Aortic Atherosclerosis (ICD10-I70.0). 4. Scoliosis and degenerative changes of the lumbar spine. Electronically Signed   By: San Morelle M.D.   On: 12/02/2018 04:32   Dg Chest Port 1 View  Result Date: 12/02/2018 CLINICAL DATA:  Cough EXAM: PORTABLE CHEST 1 VIEW COMPARISON:  04/11/2011 FINDINGS: Grossly unchanged cardiac silhouette and mediastinal contours. Large retrocardiac air and fluid containing structure  compatible with a hiatal hernia, unchanged. No focal airspace opacities. No pleural effusion or pneumothorax. No evidence of edema. No acute osseous abnormalities. Post left-sided mastectomy and left axillary adenectomy. IMPRESSION: 1.  No acute cardiopulmonary disease. 2. Unchanged large hiatal hernia. Electronically Signed   By: Sandi Mariscal M.D.   On: 12/02/2018 08:06   Dg Abd Portable 1v-small Bowel Obstruction Protocol-initial, 8 Hr Delay  Result Date: 12/02/2018 CLINICAL DATA:  Small bowel obstruction, 8 hour post-contrast film. EXAM: PORTABLE ABDOMEN - 1 VIEW COMPARISON:  Radiographs and CT earlier this day. FINDINGS: Administered enteric contrast is seen within dilated small bowel, as well as colon. There is enteric contrast within the ascending, transverse, and descending colon. Persistent small bowel dilatation in the lower abdomen at 3.1 cm. Enteric tube remains in place. Excreted IV contrast in the urinary bladder. IMPRESSION: Administered enteric contrast has reached the colon, with contrast in the ascending, transverse, and descending colon. Persistent small bowel dilatation in the lower abdomen. Electronically Signed   By: Keith Rake M.D.   On: 12/02/2018 21:28   Dg Abd Portable 1v-small Bowel Protocol-position Verification  Result Date: 12/02/2018 CLINICAL DATA:  85 year old female with NG tube placement. EXAM: PORTABLE ABDOMEN - 1 VIEW COMPARISON:  CT of the abdomen pelvis dated 12/02/2018 FINDINGS: An enteric tube is partially visualized with side port in the body of the stomach and tip in the distal stomach. Dilated loops of air-filled small bowel in the lower abdomen and pelvis measure up to 3.8 cm in diameter. Excreted contrast noted in the renal collecting system and within the urinary bladder. There is degenerative changes of the spine. The soft tissues are grossly unremarkable. IMPRESSION: 1. Enteric tube with tip in the distal stomach. 2. Persistent dilatation of distal small  bowel loops. Continued follow-up recommended. Electronically Signed   By: Anner Crete M.D.   On: 12/02/2018 10:54      Scheduled Meds:  enoxaparin (LOVENOX) injection  30 mg Subcutaneous Q24H   metoprolol tartrate  2.5 mg Intravenous Q8H   Continuous Infusions:  sodium chloride 75 mL/hr at 12/03/18 1113   piperacillin-tazobactam (ZOSYN)  IV 3.375 g (12/03/18 0536)     LOS: 1 day     Time spent: 25 minutes   Dessa Phi, DO [facility] [email or web address] 12/03/2018, 11:45 AM

## 2018-12-04 ENCOUNTER — Inpatient Hospital Stay (HOSPITAL_COMMUNITY): Payer: Medicare Other

## 2018-12-04 LAB — CBC
HCT: 41.6 % (ref 36.0–46.0)
Hemoglobin: 13.2 g/dL (ref 12.0–15.0)
MCH: 29.4 pg (ref 26.0–34.0)
MCHC: 31.7 g/dL (ref 30.0–36.0)
MCV: 92.7 fL (ref 80.0–100.0)
Platelets: 218 10*3/uL (ref 150–400)
RBC: 4.49 MIL/uL (ref 3.87–5.11)
RDW: 13.9 % (ref 11.5–15.5)
WBC: 14.8 10*3/uL — ABNORMAL HIGH (ref 4.0–10.5)
nRBC: 0 % (ref 0.0–0.2)

## 2018-12-04 LAB — BASIC METABOLIC PANEL
Anion gap: 14 (ref 5–15)
BUN: 29 mg/dL — ABNORMAL HIGH (ref 8–23)
CO2: 24 mmol/L (ref 22–32)
Calcium: 8.6 mg/dL — ABNORMAL LOW (ref 8.9–10.3)
Chloride: 105 mmol/L (ref 98–111)
Creatinine, Ser: 1.02 mg/dL — ABNORMAL HIGH (ref 0.44–1.00)
GFR calc Af Amer: 57 mL/min — ABNORMAL LOW (ref >60)
GFR calc non Af Amer: 49 mL/min — ABNORMAL LOW (ref >60)
Glucose, Bld: 119 mg/dL — ABNORMAL HIGH (ref 70–99)
Potassium: 3.1 mmol/L — ABNORMAL LOW (ref 3.5–5.1)
Sodium: 143 mmol/L (ref 135–145)

## 2018-12-04 LAB — GLUCOSE, CAPILLARY
Glucose-Capillary: 122 mg/dL — ABNORMAL HIGH (ref 70–99)
Glucose-Capillary: 259 mg/dL — ABNORMAL HIGH (ref 70–99)
Glucose-Capillary: 136 mg/dL — ABNORMAL HIGH (ref 70–99)
Glucose-Capillary: 126 mg/dL — ABNORMAL HIGH (ref 70–99)

## 2018-12-04 LAB — PROCALCITONIN: Procalcitonin: 0.63 ng/mL

## 2018-12-04 MED ORDER — FAMOTIDINE IN NACL 20-0.9 MG/50ML-% IV SOLN
20.0000 mg | Freq: Two times a day (BID) | INTRAVENOUS | Status: DC
  Administered 2018-12-04 (×2): 20 mg via INTRAVENOUS
  Filled 2018-12-04 (×2): qty 50

## 2018-12-04 MED ORDER — PANTOPRAZOLE SODIUM 40 MG IV SOLR: 40.0000 mg | INTRAVENOUS | Status: DC

## 2018-12-04 MED ORDER — POTASSIUM CHLORIDE CRYS ER 20 MEQ PO TBCR
40.0000 meq | EXTENDED_RELEASE_TABLET | Freq: Once | ORAL | Status: AC
  Administered 2018-12-04: 10:00:00 40 meq via ORAL
  Filled 2018-12-04: qty 2

## 2018-12-04 MED ORDER — POTASSIUM CHLORIDE IN NACL 40-0.9 MEQ/L-% IV SOLN
INTRAVENOUS | Status: DC
  Administered 2018-12-04: 75 mL/h via INTRAVENOUS
  Filled 2018-12-04 (×2): qty 1000

## 2018-12-04 NOTE — Progress Notes (Signed)
PROGRESS NOTE    [name]  TKZ:[id number] DOB: [birth year] DOA: 12/02/2018 PCP: Crist Infante, MD   Brief Narrative: 85 y.o.femalewith medical history significant ofessential hypertension, type 2 diabetes mellitus, HLD, GERD, anxiety, and history of diverticulitis with abscess presents with nausea, vomiting and progressive abdominal pain. Patient reports onset of symptoms last night roughly 7 PM. She was attempting to use the restroom and could not move her bowels. She tried once again later that evening, with once again no effect. She also reports significant nausea associated with vomiting. Pain is localized to bilateral lower quadrants and is crampy in nature. CT abdomen/pelvis notable for small bowel obstruction with transition point in the left lower quadrant adjacent to remote diverticular abscess. Descending/sigmoid diverticulosis without diverticulitis. No appreciable abscess noted on CT. Patient was given 1 L normal saline bolus and morphine for her abdominal pain. She was referred for admission by ED provider for small bowel obstruction.  She was admitted, NGT in place, seen by surgery 7/8-having BM,NG clamped  Subjective: Seen/examined On bedside chair Passing gas and had BM, no nausea,vomitting or abd pain. NG clamped this am No acute events overnight WBC downtrending, no fever  Assessment & Plan:   Small bowel obstruction with left lower quadrant transition at the site of previous diverticular abscess/probable recurrent diverticulitis: SBO seems to have resolved.  Having bowel movement, abdominal x-ray with contrast in colon. NG tube clamped-hopeful for removal today.  Discussed with Dr. Dema Severin from surgery.  Continue on Zosyn for now, leukocytosis nicely downtrending.  Supportive care hydration.  Recent Labs  Lab 12/02/18 0138 12/02/18 0848 12/03/18 0331 12/04/18 0416  WBC 24.5* 15.7* 15.2* 14.8*   Essential hypertension: Blood pressure controlled on IV  Lopressor.  Continue to hold p.o. amlodipine/Toprol for today  Hiatal hernia with GERD: Keep on IV pepcid  Anxiety-stable-resume home Xanax.  Type II diabetes mellitus with renal manifestations: Blood sugar fluctuating  119-250, cont ssi for now w accucheck monitoring.  CKD stage III: Creatinine stable.  Monitor.  Hypokalemia- repleting,recheck  FEN-starting clears- resume po meds in am-ppi/toprol/asa  DVT prophylaxis: SQ lovenox Code Status: full Family Communication: plan of care discussed with patient in detail. Will update family Disposition Plan: Remains inpatient pending clinical improvement. I discussed w Dr Dema Severin  Consultants:  CCS  Procedures: NGT  Antimicrobials: Anti-infectives (From admission, onward)   Start     Dose/Rate Route Frequency Ordered Stop   12/02/18 1400  piperacillin-tazobactam (ZOSYN) IVPB 3.375 g     3.375 g 12.5 mL/hr over 240 Minutes Intravenous Every 8 hours 12/02/18 0617     12/02/18 0630  piperacillin-tazobactam (ZOSYN) IVPB 3.375 g     3.375 g 100 mL/hr over 30 Minutes Intravenous  Once 12/02/18 0617 12/02/18 0700       Objective: Vitals:   12/03/18 2125 12/04/18 0525 12/04/18 0817 12/04/18 1324  BP: (!) 145/74 140/71 (!) 145/72 (!) 154/75  Pulse: 99 100 94 86  Resp: 16 18 16    Temp: 98.5 F (36.9 C) 98.4 F (36.9 C) 98 F (36.7 C) 98.5 F (36.9 C)  TempSrc: Oral Oral Oral Oral  SpO2: 95% 94% 94% 94%  Weight:      Height:        Intake/Output Summary (Last 24 hours) at 12/04/2018 1339 Last data filed at 12/04/2018 1117 Gross per 24 hour  Intake 1496.02 ml  Output 1900 ml  Net -403.98 ml   Filed Weights   12/02/18 0027  Weight: 40.8 kg   Weight  change:   Body mass index is 18.18 kg/m.  Intake/Output from previous day: 07/07 0701 - 07/08 0700 In: 821.9 [P.O.:180; I.V.:498; IV Piggyback:143.9] Out: 2650 [Urine:450; Emesis/NG output:2200] Intake/Output this shift: Total I/O In: 838.5 [P.O.:480; I.V.:288.8; IV  Piggyback:69.7] Out: 200 [Urine:200]  Examination:  General exam: Appears calm and comfortable,NGT+,thin, frail HEENT:PERRL,Oral mucosa moist, Ear/Nose normal on gross exam Respiratory system: Bilateral equal air entry, normal vesicular breath sounds, no wheezes or crackles  Cardiovascular system: S1 & S2 heard,No JVD, murmurs. Gastrointestinal system: Abdomen is  soft, distended, non tender,BS +  Nervous System:Alert and oriented. No focal neurological deficits/moving extremities, sensation intact. Extremities: No edema, no clubbing, distal peripheral pulses palpable. Skin: No rashes, lesions, no icterus MSK: Normal muscle bulk,tone ,power  Medications:  Scheduled Meds: . enoxaparin (LOVENOX) injection  30 mg Subcutaneous Q24H  . metoprolol tartrate  2.5 mg Intravenous Q8H  . pantoprazole (PROTONIX) IV  40 mg Intravenous Q24H   Continuous Infusions: . 0.9 % NaCl with KCl 40 mEq / L 75 mL/hr (12/04/18 1004)  . famotidine (PEPCID) IV 20 mg (12/04/18 0953)  . piperacillin-tazobactam (ZOSYN)  IV 3.375 g (12/04/18 1321)    Data Reviewed: I have personally reviewed following labs and imaging studies  CBC: Recent Labs  Lab 12/02/18 0138 12/02/18 0848 12/03/18 0331 12/04/18 0416  WBC 24.5* 15.7* 15.2* 14.8*  NEUTROABS 22.0*  --   --   --   HGB 15.0 13.6 12.3 13.2  HCT 45.9 43.4 39.4 41.6  MCV 91.4 92.5 92.3 92.7  PLT 254 237 208 962   Basic Metabolic Panel: Recent Labs  Lab 12/02/18 0138 12/02/18 0848 12/03/18 0331 12/04/18 0416  NA 139  --  141 143  K 4.0  --  3.6 3.1*  CL 104  --  106 105  CO2 23  --  26 24  GLUCOSE 209*  --  161* 119*  BUN 25*  --  24* 29*  CREATININE 0.89 0.99 1.12* 1.02*  CALCIUM 9.5  --  8.2* 8.6*  MG  --   --  2.1  --    GFR: Estimated Creatinine Clearance: 25 mL/min (A) (by C-G formula based on SCr of 1.02 mg/dL (H)). Liver Function Tests: Recent Labs  Lab 12/02/18 0138  AST 20  ALT 19  ALKPHOS 49  BILITOT 0.6  PROT 7.9   ALBUMIN 4.5   Recent Labs  Lab 12/02/18 0138  LIPASE 48   No results for input(s): AMMONIA in the last 168 hours. Coagulation Profile: No results for input(s): INR, PROTIME in the last 168 hours. Cardiac Enzymes: No results for input(s): CKTOTAL, CKMB, CKMBINDEX, TROPONINI in the last 168 hours. BNP (last 3 results) No results for input(s): PROBNP in the last 8760 hours. HbA1C: Recent Labs    12/02/18 0848  HGBA1C 6.7*   CBG: Recent Labs  Lab 12/03/18 1124 12/03/18 1754 12/03/18 2335 12/04/18 0528 12/04/18 1144  GLUCAP 126* 109* 117* 122* 259*   Lipid Profile: No results for input(s): CHOL, HDL, LDLCALC, TRIG, CHOLHDL, LDLDIRECT in the last 72 hours. Thyroid Function Tests: No results for input(s): TSH, T4TOTAL, FREET4, T3FREE, THYROIDAB in the last 72 hours. Anemia Panel: No results for input(s): VITAMINB12, FOLATE, FERRITIN, TIBC, IRON, RETICCTPCT in the last 72 hours. Sepsis Labs: Recent Labs  Lab 12/02/18 0848 12/02/18 1057 12/03/18 0331 12/04/18 0416  PROCALCITON 0.33  --  1.46 0.63  LATICACIDVEN 4.2* 4.0*  --   --     Recent Results (from the past 240  hour(s))  SARS Coronavirus 2 (CEPHEID - Performed in [place] hospital lab), Hosp Order     Status: None   Collection Time: 12/02/18  5:40 AM   Specimen: Nasopharyngeal Swab  Result Value Ref Range Status   SARS Coronavirus 2 NEGATIVE NEGATIVE Final    Comment: (NOTE) If result is NEGATIVE SARS-CoV-2 target nucleic acids are NOT DETECTED. The SARS-CoV-2 RNA is generally detectable in upper and lower  respiratory specimens during the acute phase of infection. The lowest  concentration of SARS-CoV-2 viral copies this assay can detect is 250  copies / mL. A negative result does not preclude SARS-CoV-2 infection  and should not be used as the sole basis for treatment or other  patient management decisions.  A negative result may occur with  improper specimen collection / handling, submission of  specimen other  than nasopharyngeal swab, presence of viral mutation(s) within the  areas targeted by this assay, and inadequate number of viral copies  (<250 copies / mL). A negative result must be combined with clinical  observations, patient history, and epidemiological information. If result is POSITIVE SARS-CoV-2 target nucleic acids are DETECTED. The SARS-CoV-2 RNA is generally detectable in upper and lower  respiratory specimens dur ing the acute phase of infection.  Positive  results are indicative of active infection with SARS-CoV-2.  Clinical  correlation with patient history and other diagnostic information is  necessary to determine patient infection status.  Positive results do  not rule out bacterial infection or co-infection with other viruses. If result is PRESUMPTIVE POSTIVE SARS-CoV-2 nucleic acids MAY BE PRESENT.   A presumptive positive result was obtained on the submitted specimen  and confirmed on repeat testing.  While 2019 novel coronavirus  (SARS-CoV-2) nucleic acids may be present in the submitted sample  additional confirmatory testing may be necessary for epidemiological  and / or clinical management purposes  to differentiate between  SARS-CoV-2 and other Sarbecovirus currently known to infect humans.  If clinically indicated additional testing with an alternate test  methodology [phone]) is advised. The SARS-CoV-2 RNA is generally  detectable in upper and lower respiratory sp ecimens during the acute  phase of infection. The expected result is Negative. Fact Sheet for Patients:  StrictlyIdeas.no Fact Sheet for Healthcare Providers: BankingDealers.co.za This test is not yet approved or cleared by the [place] FDA and has been authorized for detection and/or diagnosis of SARS-CoV-2 by FDA under an Emergency Use Authorization (EUA).  This EUA will remain in effect (meaning this test can be used) for the  duration of the COVID-19 declaration under Section 564(b)(1) of the Act, 21 U.S.C. section 360bbb-3(b)(1), unless the authorization is terminated or revoked sooner. Performed at [facility], [place] [address] [place], [place] [address]       Radiology Studies: Dg Abd 1 View  Result Date: 12/04/2018 CLINICAL DATA:  84 year old female with small bowel obstruction on CT Abdomen and Pelvis 12/02/2018. EXAM: ABDOMEN - 1 VIEW COMPARISON:  Post oral contrast images 12/02/2018, prior CT. FINDINGS: KUB at 0519 hours. Stable enteric tube with side hole at the level of the gastric body. The previous oral contrast is now present throughout nondilated large bowel to the rectum. No residual in small bowel. And no dilated small bowel loops are evident now. Negative lung bases. Stable visualized osseous structures. IMPRESSION: 1. Stable enteric tube. 2. Resolved small bowel obstruction gas pattern. Residual contrast throughout the large bowel. Electronically Signed   By: Herminio Heads.D.  On: 12/04/2018 07:39   Dg Abd Portable 1v-small Bowel Obstruction Protocol-initial, 8 Hr Delay  Result Date: 12/02/2018 CLINICAL DATA:  Small bowel obstruction, 8 hour post-contrast film. EXAM: PORTABLE ABDOMEN - 1 VIEW COMPARISON:  Radiographs and CT earlier this day. FINDINGS: Administered enteric contrast is seen within dilated small bowel, as well as colon. There is enteric contrast within the ascending, transverse, and descending colon. Persistent small bowel dilatation in the lower abdomen at 3.1 cm. Enteric tube remains in place. Excreted IV contrast in the urinary bladder. IMPRESSION: Administered enteric contrast has reached the colon, with contrast in the ascending, transverse, and descending colon. Persistent small bowel dilatation in the lower abdomen. Electronically Signed   By: Keith Rake M.D.   On: 12/02/2018 21:28      LOS: 2 days   Time spent: More than 50% of that time was spent in  counseling and/or coordination of care.  Antonieta Pert, MD [facility]  12/04/2018, 1:39 PM

## 2018-12-04 NOTE — Progress Notes (Signed)
Occupational Therapy Treatment Patient Details Name: [name] MRN: [id number] DOB: [birth year] Today's Date: 12/04/2018    History of present illness 85 yo female admitted with SBO. Hx of diverticular abscess, fibromyalgia, osteoporosis, breast cancer, s/p mastectomy   OT comments  Pt presents seated in recliner, reports having already ambulated with nursing staff this AM but is agreeable to seated activity. Pt engaging in bil UE/LE exercise for continued strengthening and endurance. Pt fatigued with activity but motivated to return to her PLOF. Appears to be making good progress. Will continue to follow acutely.   Follow Up Recommendations  No OT follow up    Equipment Recommendations  None recommended by OT          Precautions / Restrictions Precautions Precautions: Fall Precaution Comments: NG tube Restrictions Weight Bearing Restrictions: No       Mobility Bed Mobility               General bed mobility comments: oob in recliner                         ADL either performed or assessed with clinical judgement   ADL Overall ADL's : Needs assistance/impaired                                       General ADL Comments: pt declined need to perform ADL this session and reports just recently ambulated with RN Staff, agreeable to UB/LB exercise                       Cognition Arousal/Alertness: Awake/alert Behavior During Therapy: WFL for tasks assessed/performed Overall Cognitive Status: Within Functional Limits for tasks assessed                                          Exercises Exercises: General Upper Extremity;General Lower Extremity;Other exercises General Exercises - Upper Extremity Shoulder Flexion: AROM;Both;10 reps;Seated Elbow Flexion: AROM;Both;10 reps;Seated General Exercises - Lower Extremity Ankle Circles/Pumps: AROM;Both;10 reps;Seated Hip ABduction/ADduction: AROM;Both;10  reps;Seated Straight Leg Raises: AROM;Both;10 reps;Seated Other Exercises Other Exercises: shoulder rolls x10 bil UE Other Exercises: PNF diagonals x10, bil UE    Shoulder Instructions       General Comments      Pertinent Vitals/ Pain       Pain Assessment: Faces Faces Pain Scale: Hurts a little bit Pain Location: abdomen Pain Descriptors / Indicators: Sore;Discomfort Pain Intervention(s): Monitored during session  Home Living                                          Prior Functioning/Environment              Frequency  Min 2X/week        Progress Toward Goals  OT Goals(current goals can now be found in the care plan section)  Progress towards OT goals: Progressing toward goals  Acute Rehab OT Goals Patient Stated Goal: to get better and regain PLOF OT Goal Formulation: With patient Time For Goal Achievement: 12/16/18  Plan Discharge plan remains appropriate    Co-evaluation  AM-PAC OT "6 Clicks" Daily Activity     Outcome Measure   Help from another person eating meals?: None Help from another person taking care of personal grooming?: A Little Help from another person toileting, which includes using toliet, bedpan, or urinal?: A Little Help from another person bathing (including washing, rinsing, drying)?: A Little Help from another person to put on and taking off regular upper body clothing?: None Help from another person to put on and taking off regular lower body clothing?: A Little 6 Click Score: 20    End of Session    OT Visit Diagnosis: Muscle weakness (generalized) (M62.81)   Activity Tolerance Patient tolerated treatment well   Patient Left in chair;with call bell/phone within reach   Nurse Communication Mobility status        Time: 8088-1103 OT Time Calculation (min): 12 min  Charges: OT General Charges $OT Visit: 1 Visit OT Treatments $Therapeutic Activity: 8-22 mins  [name], OT [facility] Services Pager [phone] Office [phone]    [name] 12/04/2018, 11:48 AM

## 2018-12-04 NOTE — Progress Notes (Signed)
    CC: abdominal pain  Subjective: She looks better this a.m. she had a bowel movement.  She had a lot out through her NG but some of it may be ice not recorded.  She is feeling much better.  Objective: Vital signs in last 24 hours: Temp:  [98.4 F (36.9 C)-98.6 F (37 C)] 98.4 F (36.9 C) (07/08 0525) Pulse Rate:  [86-100] 100 (07/08 0525) Resp:  [16-18] 18 (07/08 0525) BP: (132-176)/(68-78) 140/71 (07/08 0525) SpO2:  [94 %-99 %] 94 % (07/08 0525) Last BM Date: 12/01/18 180 PO 700 IV 450 urine 2200 NG BM x 2 Afebrile, VSS Creatinine better WBC Still up 14.8 AXR:  Resolved small bowel obstruction gas pattern. Residual contrast throughout the large bowel   Intake/Output from previous day: 07/07 0701 - 07/08 0700 In: 821.9 [P.O.:180; I.V.:498; IV Piggyback:143.9] Out: 2650 [Urine:450; Emesis/NG QMGNOI:3704] Intake/Output this shift: No intake/output data recorded.  General appearance: alert, cooperative and no distress Resp: clear to auscultation bilaterally GI: Soft, few bowel sounds.  Still hypoactive.  Positive BM.  Less distention this a.m.  Lab Results:  Recent Labs    12/03/18 0331 12/04/18 0416  WBC 15.2* 14.8*  HGB 12.3 13.2  HCT 39.4 41.6  PLT 208 218    BMET Recent Labs    12/03/18 0331 12/04/18 0416  NA 141 143  K 3.6 3.1*  CL 106 105  CO2 26 24  GLUCOSE 161* 119*  BUN 24* 29*  CREATININE 1.12* 1.02*  CALCIUM 8.2* 8.6*   PT/INR No results for input(s): LABPROT, INR in the last 72 hours.  Recent Labs  Lab 12/02/18 0138  AST 20  ALT 19  ALKPHOS 49  BILITOT 0.6  PROT 7.9  ALBUMIN 4.5     Lipase     Component Value Date/Time   LIPASE 48 12/02/2018 0138     Medications: . enoxaparin (LOVENOX) injection  30 mg Subcutaneous Q24H  . metoprolol tartrate  2.5 mg Intravenous Q8H    Assessment/Plan Hypertension Type 2 diabetes GERD Hyperlipidemia COVID negative Dehydration - creatinine 0.89 >>1.21>>1.02  Smallbowel  obstruction transition point left lower quadrant adjacent to prior diverticular abscess/probable recurrent diverticulitis Diverticular abscess with IR drain placement 12/26/2017 - Leukocytosis 24.5>>15.7>>15.2>>14.8 FEN: N.p.o. - sips and chips/IV fluids ID: None DVT: SCDs/Lovenox Follow-up: To be determined POC: Griffin,Ann Friend [phone]    Plan: Clamp the NG put her on clears and pull the NG later this a.m. she does well.  WBC is still up some.       LOS: 2 days    [name] 12/04/2018 [phone]

## 2018-12-05 ENCOUNTER — Encounter (HOSPITAL_COMMUNITY): Payer: Self-pay | Admitting: *Deleted

## 2018-12-05 DIAGNOSIS — I119 Hypertensive heart disease without heart failure: Secondary | ICD-10-CM

## 2018-12-05 DIAGNOSIS — K219 Gastro-esophageal reflux disease without esophagitis: Secondary | ICD-10-CM

## 2018-12-05 DIAGNOSIS — E7800 Pure hypercholesterolemia, unspecified: Secondary | ICD-10-CM

## 2018-12-05 DIAGNOSIS — R739 Hyperglycemia, unspecified: Secondary | ICD-10-CM

## 2018-12-05 LAB — BASIC METABOLIC PANEL
Anion gap: 9 (ref 5–15)
BUN: 23 mg/dL (ref 8–23)
CO2: 20 mmol/L — ABNORMAL LOW (ref 22–32)
Calcium: 8.2 mg/dL — ABNORMAL LOW (ref 8.9–10.3)
Chloride: 109 mmol/L (ref 98–111)
Creatinine, Ser: 0.77 mg/dL (ref 0.44–1.00)
GFR calc Af Amer: >60 mL/min (ref >60)
GFR calc non Af Amer: >60 mL/min (ref >60)
Glucose, Bld: 117 mg/dL — ABNORMAL HIGH (ref 70–99)
Potassium: 4.5 mmol/L (ref 3.5–5.1)
Sodium: 138 mmol/L (ref 135–145)

## 2018-12-05 LAB — C DIFFICILE QUICK SCREEN W PCR REFLEX
C Diff antigen: POSITIVE — AB
C Diff toxin: NEGATIVE

## 2018-12-05 LAB — CBC
HCT: 39.2 % (ref 36.0–46.0)
Hemoglobin: 12.6 g/dL (ref 12.0–15.0)
MCH: 30.1 pg (ref 26.0–34.0)
MCHC: 32.1 g/dL (ref 30.0–36.0)
MCV: 93.6 fL (ref 80.0–100.0)
Platelets: 200 10*3/uL (ref 150–400)
RBC: 4.19 MIL/uL (ref 3.87–5.11)
RDW: 14 % (ref 11.5–15.5)
WBC: 15.7 10*3/uL — ABNORMAL HIGH (ref 4.0–10.5)
nRBC: 0 % (ref 0.0–0.2)

## 2018-12-05 LAB — DIFFERENTIAL
Basophils Absolute: 0 10*3/uL (ref 0.0–0.1)
Basophils Relative: 0 %
Eosinophils Absolute: 0.1 10*3/uL (ref 0.0–0.5)
Eosinophils Relative: 1 %
Lymphocytes Relative: 10 %
Lymphs Abs: 1.6 10*3/uL (ref 0.7–4.0)
Monocytes Absolute: 1 10*3/uL (ref 0.1–1.0)
Monocytes Relative: 7 %
Neutro Abs: 13.1 10*3/uL — ABNORMAL HIGH (ref 1.7–7.7)
Neutrophils Relative %: 82 %

## 2018-12-05 LAB — GLUCOSE, CAPILLARY
Glucose-Capillary: 131 mg/dL — ABNORMAL HIGH (ref 70–99)
Glucose-Capillary: 136 mg/dL — ABNORMAL HIGH (ref 70–99)
Glucose-Capillary: 91 mg/dL (ref 70–99)
Glucose-Capillary: 92 mg/dL (ref 70–99)

## 2018-12-05 MED ORDER — METOPROLOL SUCCINATE ER 25 MG PO TB24
12.5000 mg | ORAL_TABLET | Freq: Every day | ORAL | Status: DC
  Administered 2018-12-05 – 2018-12-06 (×2): 12.5 mg via ORAL
  Filled 2018-12-05 (×2): qty 1

## 2018-12-05 MED ORDER — FAMOTIDINE 20 MG PO TABS
20.0000 mg | ORAL_TABLET | Freq: Every day | ORAL | Status: DC
  Administered 2018-12-05 – 2018-12-06 (×2): 20 mg via ORAL
  Filled 2018-12-05 (×2): qty 1

## 2018-12-05 MED ORDER — AMLODIPINE BESYLATE 5 MG PO TABS
2.5000 mg | ORAL_TABLET | Freq: Every day | ORAL | Status: DC
  Administered 2018-12-05 – 2018-12-06 (×2): 2.5 mg via ORAL
  Filled 2018-12-05 (×2): qty 1

## 2018-12-05 MED ORDER — SACCHAROMYCES BOULARDII 250 MG PO CAPS
250.0000 mg | ORAL_CAPSULE | Freq: Two times a day (BID) | ORAL | Status: DC
  Administered 2018-12-05 – 2018-12-06 (×3): 250 mg via ORAL
  Filled 2018-12-05 (×3): qty 1

## 2018-12-05 MED ORDER — INSULIN ASPART 100 UNIT/ML ~~LOC~~ SOLN
0.0000 [IU] | Freq: Three times a day (TID) | SUBCUTANEOUS | Status: DC
  Administered 2018-12-06: 09:00:00 1 [IU] via SUBCUTANEOUS

## 2018-12-05 NOTE — Plan of Care (Signed)
Patient sitting up in bed this morning; pain controlled at this time. Hopeful for discharge today. Will continue to monitor.

## 2018-12-05 NOTE — Progress Notes (Signed)
    CC: abdominal pain  Subjective: Feeling better each day. Tolerating clear liquids without n/v. Having flatus and BMs again.   Objective: Vital signs in last 24 hours: Temp:  [97.9 F (36.6 C)-98.5 F (36.9 C)] 98 F (36.7 C) (07/09 0620) Pulse Rate:  [86-96] 88 (07/09 0620) Resp:  [16-19] 18 (07/09 0620) BP: (145-154)/(72-83) 146/80 (07/09 0620) SpO2:  [94 %-97 %] 96 % (07/09 0620) Last BM Date: 12/04/18 180 PO 700 IV 450 urine 2200 NG BM x 2 Afebrile, VSS Creatinine better WBC Still up 14.8 AXR:  Resolved small bowel obstruction gas pattern. Residual contrast throughout the large bowel   Intake/Output from previous day: 07/08 0701 - 07/09 0700 In: 1801.6 [P.O.:480; I.V.:1096.9; IV Piggyback:224.7] Out: 400 [Urine:400] Intake/Output this shift: No intake/output data recorded.  General appearance: alert, cooperative and no distress Resp: clear to auscultation bilaterally GI: Soft, nontender, nondistended  Lab Results:  Recent Labs    12/04/18 0416 12/05/18 0409  WBC 14.8* 15.7*  HGB 13.2 12.6  HCT 41.6 39.2  PLT 218 200    BMET Recent Labs    12/04/18 0416 12/05/18 0409  NA 143 138  K 3.1* 4.5  CL 105 109  CO2 24 20*  GLUCOSE 119* 117*  BUN 29* 23  CREATININE 1.02* 0.77  CALCIUM 8.6* 8.2*   PT/INR No results for input(s): LABPROT, INR in the last 72 hours.  Recent Labs  Lab 12/02/18 0138  AST 20  ALT 19  ALKPHOS 49  BILITOT 0.6  PROT 7.9  ALBUMIN 4.5     Lipase     Component Value Date/Time   LIPASE 48 12/02/2018 0138     Medications: . amLODipine  2.5 mg Oral Daily  . enoxaparin (LOVENOX) injection  30 mg Subcutaneous Q24H  . metoprolol succinate  12.5 mg Oral Daily    Assessment/Plan Hypertension Type 2 diabetes GERD Hyperlipidemia COVID negative Dehydration - creatinine 0.89 >>1.21>>1.02  Smallbowel obstruction transition point left lower quadrant adjacent to prior diverticular abscess/probable recurrent  diverticulitis Diverticular abscess with IR drain placement 12/26/2017 - Leukocytosis 24.5>>15.7>>15.2>>14.8>>15.7 FEN: CLD ID: None DVT: SCDs/Lovenox Follow-up: To be determined POC: Griffin,Ann Friend [phone]    Plan: Clear liquids, advance as tolerated; continue IV abx today given mild bump in wbc; repeat labs in am       LOS: 3 days    Ileana Roup 12/05/2018 [phone]

## 2018-12-05 NOTE — Progress Notes (Addendum)
PROGRESS NOTE  ELODIA HAVILAND  UQJ:[id number] DOB: 11-Feb-1929 DOA: 12/02/2018 PCP: Crist Infante, MD   Brief Narrative: 85 y.o.femalewith medical history significant ofessential hypertension, type 2 diabetes mellitus, HLD, GERD, anxiety, and history of diverticulitis with abscess presents with nausea, vomiting and progressive abdominal pain. Patient reports onset of symptoms last night roughly 7 PM. She was attempting to use the restroom and could not move her bowels. She tried once again later that evening, with once again no effect. She also reports significant nausea associated with vomiting. Pain is localized to bilateral lower quadrants and is crampy in nature. CT abdomen/pelvis notable for small bowel obstruction with transition point in the left lower quadrant adjacent to remote diverticular abscess. Descending/sigmoid diverticulosis without diverticulitis. No appreciable abscess noted on CT. Patient was given 1 L normal saline bolus and morphine for her abdominal pain. She was referred for admission by ED provider for small bowel obstruction.  Subjective: Pt continues to tolerate clears, Pt prefers to continue clears diet for now, having BMs, disappointed about not going home today.   Assessment & Plan:   Small bowel obstruction with left lower quadrant transition at the site of previous diverticular abscess/probable recurrent diverticulitis: SBO seems to have resolved.  Having bowel movements.   Diverticulitis - WBC remains elevated, continue IV antibiotics today.  Recheck CBC in AM.  Follow. Not ready for home today.  Pt is disappointed but I have counseled her and she agrees to stay for more IV antibiotics.   Recent Labs  Lab 12/02/18 0138 12/02/18 0848 12/03/18 0331 12/04/18 0416 12/05/18 0409  WBC 24.5* 15.7* 15.2* 14.8* 15.7*   Essential hypertension: Resumed home amlodipine/metoprolol.   Hiatal hernia with GERD: oral pepcid ordered.   Anxiety-stable-resumed  home  alprazolam.  Type II diabetes mellitus with renal manifestations: cont SSI and CBG testing.  CKD stage III: Creatinine stable.  Monitor.  Hypokalemia- repleted, reduce rate of IV fluids   DVT prophylaxis: SQ lovenox Code Status: full Family Communication: plan of care discussed with patient in detail. Will update family Disposition Plan: Remains inpatient for IV antibiotics  Consultants:  CCS  Procedures: NGT  Antimicrobials: Anti-infectives (From admission, onward)   Start     Dose/Rate Route Frequency Ordered Stop   12/02/18 1400  piperacillin-tazobactam (ZOSYN) IVPB 3.375 g     3.375 g 12.5 mL/hr over 240 Minutes Intravenous Every 8 hours 12/02/18 0617     12/02/18 0630  piperacillin-tazobactam (ZOSYN) IVPB 3.375 g     3.375 g 100 mL/hr over 30 Minutes Intravenous  Once 12/02/18 0617 12/02/18 0700       Objective: Vitals:   12/04/18 0817 12/04/18 1324 12/04/18 2025 12/05/18 0620  BP: (!) 145/72 (!) 154/75 (!) 152/83 (!) 146/80  Pulse: 94 86 96 88  Resp: 16  19 18   Temp: 98 F (36.7 C) 98.5 F (36.9 C) 97.9 F (36.6 C) 98 F (36.7 C)  TempSrc: Oral Oral Oral Oral  SpO2: 94% 94% 97% 96%  Weight:      Height:        Intake/Output Summary (Last 24 hours) at 12/05/2018 0836 Last data filed at 12/05/2018 0606 Gross per 24 hour  Intake 1801.62 ml  Output 400 ml  Net 1401.62 ml   Filed Weights   12/02/18 0027  Weight: 40.8 kg   Weight change:   Body mass index is 18.18 kg/m.  Intake/Output from previous day: 07/08 0701 - 07/09 0700 In: 1801.6 [P.O.:480; I.V.:1096.9; IV Piggyback:224.7] Out: 400 [Urine:400]  Intake/Output this shift: No intake/output data recorded.  Examination:  General exam: Appears calm and comfortable,NGT+,thin, frail, NAD. Cooperative.  HEENT:NCAT MMM Respiratory system: BBS clear.   Cardiovascular system: S1 & S2 heard,No JVD, murmurs. Gastrointestinal system: Abdomen is  soft, distended, non tender, active BS.   Nervous  System:Alert and oriented. No focal neurological deficits/moving extremities, sensation intact. Extremities: No edema, no clubbing, distal peripheral pulses palpable. Skin: No rashes, lesions, no icterus MSK: Normal muscle bulk,tone ,power  Medications:  Scheduled Meds: . amLODipine  2.5 mg Oral Daily  . enoxaparin (LOVENOX) injection  30 mg Subcutaneous Q24H  . famotidine  20 mg Oral Daily  . metoprolol succinate  12.5 mg Oral Daily  . saccharomyces boulardii  250 mg Oral BID   Continuous Infusions: . 0.9 % NaCl with KCl 40 mEq / L 75 mL/hr (12/04/18 1004)  . piperacillin-tazobactam (ZOSYN)  IV 3.375 g (12/05/18 0606)    Data Reviewed: I have personally reviewed following labs and imaging studies  CBC: Recent Labs  Lab 12/02/18 0138 12/02/18 0848 12/03/18 0331 12/04/18 0416 12/05/18 0409  WBC 24.5* 15.7* 15.2* 14.8* 15.7*  NEUTROABS 22.0*  --   --   --  13.1*  HGB 15.0 13.6 12.3 13.2 12.6  HCT 45.9 43.4 39.4 41.6 39.2  MCV 91.4 92.5 92.3 92.7 93.6  PLT 254 237 208 218 196   Basic Metabolic Panel: Recent Labs  Lab 12/02/18 0138 12/02/18 0848 12/03/18 0331 12/04/18 0416 12/05/18 0409  NA 139  --  141 143 138  K 4.0  --  3.6 3.1* 4.5  CL 104  --  106 105 109  CO2 23  --  26 24 20*  GLUCOSE 209*  --  161* 119* 117*  BUN 25*  --  24* 29* 23  CREATININE 0.89 0.99 1.12* 1.02* 0.77  CALCIUM 9.5  --  8.2* 8.6* 8.2*  MG  --   --  2.1  --   --    GFR: Estimated Creatinine Clearance: 31.9 mL/min (by C-G formula based on SCr of 0.77 mg/dL). Liver Function Tests: Recent Labs  Lab 12/02/18 0138  AST 20  ALT 19  ALKPHOS 49  BILITOT 0.6  PROT 7.9  ALBUMIN 4.5   Recent Labs  Lab 12/02/18 0138  LIPASE 48   No results for input(s): AMMONIA in the last 168 hours. Coagulation Profile: No results for input(s): INR, PROTIME in the last 168 hours. Cardiac Enzymes: No results for input(s): CKTOTAL, CKMB, CKMBINDEX, TROPONINI in the last 168 hours. BNP (last 3  results) No results for input(s): PROBNP in the last 8760 hours. HbA1C: Recent Labs    12/02/18 0848  HGBA1C 6.7*   CBG: Recent Labs  Lab 12/04/18 0528 12/04/18 1144 12/04/18 1752 12/04/18 2327 12/05/18 0610  GLUCAP 122* 259* 136* 126* 131*   Lipid Profile: No results for input(s): CHOL, HDL, LDLCALC, TRIG, CHOLHDL, LDLDIRECT in the last 72 hours. Thyroid Function Tests: No results for input(s): TSH, T4TOTAL, FREET4, T3FREE, THYROIDAB in the last 72 hours. Anemia Panel: No results for input(s): VITAMINB12, FOLATE, FERRITIN, TIBC, IRON, RETICCTPCT in the last 72 hours. Sepsis Labs: Recent Labs  Lab 12/02/18 0848 12/02/18 1057 12/03/18 0331 12/04/18 0416  PROCALCITON 0.33  --  1.46 0.63  LATICACIDVEN 4.2* 4.0*  --   --     Recent Results (from the past 240 hour(s))  SARS Coronavirus 2 (CEPHEID - Performed in [place] hospital lab), Hosp Order     Status: None  Collection Time: 12/02/18  5:40 AM   Specimen: Nasopharyngeal Swab  Result Value Ref Range Status   SARS Coronavirus 2 NEGATIVE NEGATIVE Final    Comment: (NOTE) If result is NEGATIVE SARS-CoV-2 target nucleic acids are NOT DETECTED. The SARS-CoV-2 RNA is generally detectable in upper and lower  respiratory specimens during the acute phase of infection. The lowest  concentration of SARS-CoV-2 viral copies this assay can detect is 250  copies / mL. A negative result does not preclude SARS-CoV-2 infection  and should not be used as the sole basis for treatment or other  patient management decisions.  A negative result may occur with  improper specimen collection / handling, submission of specimen other  than nasopharyngeal swab, presence of viral mutation(s) within the  areas targeted by this assay, and inadequate number of viral copies  (<250 copies / mL). A negative result must be combined with clinical  observations, patient history, and epidemiological information. If result is POSITIVE SARS-CoV-2  target nucleic acids are DETECTED. The SARS-CoV-2 RNA is generally detectable in upper and lower  respiratory specimens dur ing the acute phase of infection.  Positive  results are indicative of active infection with SARS-CoV-2.  Clinical  correlation with patient history and other diagnostic information is  necessary to determine patient infection status.  Positive results do  not rule out bacterial infection or co-infection with other viruses. If result is PRESUMPTIVE POSTIVE SARS-CoV-2 nucleic acids MAY BE PRESENT.   A presumptive positive result was obtained on the submitted specimen  and confirmed on repeat testing.  While 2019 novel coronavirus  (SARS-CoV-2) nucleic acids may be present in the submitted sample  additional confirmatory testing may be necessary for epidemiological  and / or clinical management purposes  to differentiate between  SARS-CoV-2 and other Sarbecovirus currently known to infect humans.  If clinically indicated additional testing with an alternate test  methodology [phone]) is advised. The SARS-CoV-2 RNA is generally  detectable in upper and lower respiratory sp ecimens during the acute  phase of infection. The expected result is Negative. Fact Sheet for Patients:  StrictlyIdeas.no Fact Sheet for Healthcare Providers: BankingDealers.co.za This test is not yet approved or cleared by the [place] FDA and has been authorized for detection and/or diagnosis of SARS-CoV-2 by FDA under an Emergency Use Authorization (EUA).  This EUA will remain in effect (meaning this test can be used) for the duration of the COVID-19 declaration under Section 564(b)(1) of the Act, 21 U.S.C. section 360bbb-3(b)(1), unless the authorization is terminated or revoked sooner. Performed at [facility], [place] [address] [place], [place] [address]     Radiology Studies: Dg Abd 1 View  Result Date: 12/04/2018  CLINICAL DATA:  85 year old female with small bowel obstruction on CT Abdomen and Pelvis 12/02/2018. EXAM: ABDOMEN - 1 VIEW COMPARISON:  Post oral contrast images 12/02/2018, prior CT. FINDINGS: KUB at 0519 hours. Stable enteric tube with side hole at the level of the gastric body. The previous oral contrast is now present throughout nondilated large bowel to the rectum. No residual in small bowel. And no dilated small bowel loops are evident now. Negative lung bases. Stable visualized osseous structures. IMPRESSION: 1. Stable enteric tube. 2. Resolved small bowel obstruction gas pattern. Residual contrast throughout the large bowel. Electronically Signed   By: Genevie Ann M.D.   On: 12/04/2018 07:39    LOS: 3 days   Time spent: More than 50% of that time was spent in counseling and/or coordination  of care.  Irwin Brakeman, MD [facility] How to contact the [facility] Attending or Consulting provider [place] or covering provider during after hours [place], for this patient?  1. Check the care team in [facility] and look for a) attending/consulting [facility] provider listed and b) the [facility] team listed 2. Log into [email or web address] and use [place]'s universal password to access. If you do not have the password, please contact the hospital operator. 3. Locate the [facility] provider you are looking for under [facility] and page to a number that you can be directly reached. 4. If you still have difficulty reaching the provider, please page the [facility] (Director on Call) for the Hospitalists listed on amion for assistance.  12/05/2018, 8:36 AM

## 2018-12-05 NOTE — Care Management Important Message (Signed)
Important Message  Patient Details IM Letter given to Dessa Phi RN to present to the Patient Name: [name] MRN: [id number] Date of Birth: 06/18/1928   Medicare Important Message Given:  Yes     Kerin Salen 12/05/2018, 11:49 AM

## 2018-12-05 NOTE — Progress Notes (Signed)
Physical Therapy Treatment Patient Details Name: [name] MRN: [id number] DOB: 22-Oct-1928 Today's Date: 12/05/2018    History of Present Illness 85 yo female admitted with SBO. Hx of diverticular abscess, fibromyalgia, osteoporosis, breast cancer, s/p mastectomy    PT Comments    Pt demonstrates scissoring and noted unsteady gait without support of IV pole. Encouraged use of cane at home initially until [place] works with pt. Will continue to follow in acute setting  Follow Up Recommendations  Home health PT;Supervision - Intermittent     Equipment Recommendations  None recommended by PT    Recommendations for Other Services       Precautions / Restrictions Precautions Precautions: Fall Restrictions Weight Bearing Restrictions: No    Mobility  Bed Mobility               General bed mobility comments: oob in recliner  Transfers Overall transfer level: Needs assistance Equipment used: None Transfers: Sit to/from Stand Sit to Stand: Supervision         General transfer comment: for safety  Ambulation/Gait Ambulation/Gait assistance: Supervision;Min guard Gait Distance (Feet): 280 Feet Assistive device: None;IV Pole Gait Pattern/deviations: Step-through pattern;Decreased stride length;Scissoring     General Gait Details: unsteady with noted scissoring without IV pole support requiring min to min/guard for safety/balance.  improved stability with  IV pole.   Stairs             Wheelchair Mobility    Modified Rankin (Stroke Patients Only)       Balance Overall balance assessment: Needs assistance Sitting-balance support: Bilateral upper extremity supported;Feet supported Sitting balance-Leahy Scale: Good     Standing balance support: During functional activity;No upper extremity supported;Single extremity supported Standing balance-Leahy Scale: Poor Standing balance comment: reliant on at least unilateral UE support              High  level balance activites: Turns;Head turns High Level Balance Comments: min assist for safety            Cognition Arousal/Alertness: Awake/alert Behavior During Therapy: WFL for tasks assessed/performed Overall Cognitive Status: Within Functional Limits for tasks assessed                                        Exercises General Exercises - Lower Extremity Ankle Circles/Pumps: AROM;Both;10 reps;Seated Hip ABduction/ADduction: AROM;Both;10 reps;Seated Straight Leg Raises: AROM;Both;10 reps;Seated    General Comments        Pertinent Vitals/Pain Pain Assessment: No/denies pain    Home Living                      Prior Function            PT Goals (current goals can now be found in the care plan section) Acute Rehab PT Goals Patient Stated Goal: to get better and regain PLOF Time For Goal Achievement: 12/16/18 Potential to Achieve Goals: Good Progress towards PT goals: Progressing toward goals    Frequency    Min 3X/week      PT Plan Current plan remains appropriate    Co-evaluation              AM-PAC PT "6 Clicks" Mobility   Outcome Measure  Help needed turning from your back to your side while in a flat bed without using bedrails?: None Help needed moving from lying on your back to sitting on  the side of a flat bed without using bedrails?: None Help needed moving to and from a bed to a chair (including a wheelchair)?: A Little Help needed standing up from a chair using your arms (e.g., wheelchair or bedside chair)?: A Little Help needed to walk in hospital room?: A Little Help needed climbing 3-5 steps with a railing? : A Little 6 Click Score: 20    End of Session   Activity Tolerance: Patient tolerated treatment well Patient left: in chair;with call bell/phone within reach   PT Visit Diagnosis: Unsteadiness on feet (R26.81);Muscle weakness (generalized) (M62.81)     Time: 7116-5790 PT Time Calculation (min) (ACUTE  ONLY): 24 min  Charges:  $Gait Training: 23-37 mins                     Kenyon Ana, PT  Pager: [phone] Acute Rehab Dept [facility]): [phone]   12/05/2018    [facility] 12/05/2018, 3:31 PM

## 2018-12-05 NOTE — Progress Notes (Signed)
PHARMACY NOTE -  [place] has been assisting with dosing of Zosyn for intra-abdominal infection.  Dosage remains stable at 3.375 g IV q8 hr and need for further dosage adjustment appears unlikely at present given SCr at baseline  Pharmacy will sign off, following peripherally for culture results or dose adjustments. Please reconsult if a change in clinical status warrants re-evaluation of dosage.  Reuel Boom, PharmD, BCPS [phone] 12/05/2018, 7:42 AM

## 2018-12-05 NOTE — Progress Notes (Signed)
    ZO:[id number] pain  Subjective: Pain is better, stools soft, tolerating clears.   Objective: Vital signs in last 24 hours: Temp:  [97.9 F (36.6 C)-98.5 F (36.9 C)] 98 F (36.7 C) (07/09 0620) Pulse Rate:  [86-96] 88 (07/09 0620) Resp:  [18-19] 18 (07/09 0620) BP: (146-154)/(75-83) 146/80 (07/09 0620) SpO2:  [94 %-97 %] 96 % (07/09 0620) Last BM Date: 12/04/18 480 Po recorded 1200 IV 400 urine recorded Stool x 3 Afebrile, VSS Creatinine better, WBC up   CT 7/6 >>Small bowel obstruction. Transition point is in the left lower quadrant. This is adjacent to the area of the remote diverticular abscess. This most likely is related to adhesions. Although no discrete mass lesion is present, small bowel neoplasm is also considered. 2. Distal descending and sigmoid diverticulosis without diverticulitis. No residual abscess or fluid collection. 3.  Aortic Atherosclerosis (ICD10-I70.0). 4. Scoliosis and degenerative changes of the lumbar spine. ABX 7/8:  Resolved small bowel obstruction gas pattern. Residual contrast throughout the large bowel. Intake/Output from previous day: 07/08 0701 - 07/09 0700 In: 1801.6 [P.O.:480; I.V.:1096.9; IV Piggyback:224.7] Out: 400 [Urine:400] Intake/Output this shift: No intake/output data recorded.  General appearance: alert, cooperative and no distress Resp: clear to auscultation bilaterally GI: soft, not tender, tolerating clears, soft stools.  Lab Results:  Recent Labs    12/04/18 0416 12/05/18 0409  WBC 14.8* 15.7*  HGB 13.2 12.6  HCT 41.6 39.2  PLT 218 200    BMET Recent Labs    12/04/18 0416 12/05/18 0409  NA 143 138  K 3.1* 4.5  CL 105 109  CO2 24 20*  GLUCOSE 119* 117*  BUN 29* 23  CREATININE 1.02* 0.77  CALCIUM 8.6* 8.2*   PT/INR No results for input(s): LABPROT, INR in the last 72 hours.  Recent Labs  Lab 12/02/18 0138  AST 20  ALT 19  ALKPHOS 49  BILITOT 0.6  PROT 7.9  ALBUMIN 4.5     Lipase     Component Value Date/Time   LIPASE 48 12/02/2018 0138     Medications: . amLODipine  2.5 mg Oral Daily  . enoxaparin (LOVENOX) injection  30 mg Subcutaneous Q24H  . metoprolol succinate  12.5 mg Oral Daily   . 0.9 % NaCl with KCl 40 mEq / L 75 mL/hr (12/04/18 1004)  . famotidine (PEPCID) IV 20 mg (12/04/18 2302)  . piperacillin-tazobactam (ZOSYN)  IV 3.375 g (12/05/18 0606)   Assessment/Plan Hypertension Type 2 diabetes GERD Hyperlipidemia COVID negative Dehydration - creatinine 0.89 >>1.21>>1.02>>0.77(7/9)  Smallbowel obstruction transition point left lower quadrant adjacent to prior diverticular abscess/probable recurrent diverticulitis Diverticular abscess with IR drain placement 12/26/2017 - Leukocytosis 24.5>>15.7>>15.2>>14.8>>15.7(7/9)   FEN: Clear liquids/IV fluids ID: Zosyn 7/6 >> day 4 DVT: SCDs/Lovenox Follow-up: To be determined POC: Griffin,Ann Friend [phone]    Plan:  She is doing well from the obstruction issue, but her WBC is up.  We are most concerned about her diverticulitis and will continue IV abx.  Advance to full liquids, recheck labs in AM.  CT on 7/6.  Add probiotic.       LOS: 3 days    Britainy Kozub 12/05/2018 [phone]

## 2018-12-06 LAB — CBC WITH DIFFERENTIAL/PLATELET
Abs Immature Granulocytes: 0.08 10*3/uL — ABNORMAL HIGH (ref 0.00–0.07)
Basophils Absolute: 0 10*3/uL (ref 0.0–0.1)
Basophils Relative: 0 %
Eosinophils Absolute: 0.4 10*3/uL (ref 0.0–0.5)
Eosinophils Relative: 4 %
HCT: 37.7 % (ref 36.0–46.0)
Hemoglobin: 11.6 g/dL — ABNORMAL LOW (ref 12.0–15.0)
Immature Granulocytes: 1 %
Lymphocytes Relative: 13 %
Lymphs Abs: 1.5 10*3/uL (ref 0.7–4.0)
MCH: 29.3 pg (ref 26.0–34.0)
MCHC: 30.8 g/dL (ref 30.0–36.0)
MCV: 95.2 fL (ref 80.0–100.0)
Monocytes Absolute: 0.9 10*3/uL (ref 0.1–1.0)
Monocytes Relative: 8 %
Neutro Abs: 8.6 10*3/uL — ABNORMAL HIGH (ref 1.7–7.7)
Neutrophils Relative %: 74 %
Platelets: 196 10*3/uL (ref 150–400)
RBC: 3.96 MIL/uL (ref 3.87–5.11)
RDW: 13.7 % (ref 11.5–15.5)
WBC: 11.6 10*3/uL — ABNORMAL HIGH (ref 4.0–10.5)
nRBC: 0 % (ref 0.0–0.2)

## 2018-12-06 LAB — GLUCOSE, CAPILLARY
Glucose-Capillary: 62 mg/dL — ABNORMAL LOW (ref 70–99)
Glucose-Capillary: 147 mg/dL — ABNORMAL HIGH (ref 70–99)
Glucose-Capillary: 120 mg/dL — ABNORMAL HIGH (ref 70–99)

## 2018-12-06 LAB — BASIC METABOLIC PANEL
Anion gap: 8 (ref 5–15)
BUN: 18 mg/dL (ref 8–23)
CO2: 19 mmol/L — ABNORMAL LOW (ref 22–32)
Calcium: 7.7 mg/dL — ABNORMAL LOW (ref 8.9–10.3)
Chloride: 107 mmol/L (ref 98–111)
Creatinine, Ser: 0.78 mg/dL (ref 0.44–1.00)
GFR calc Af Amer: >60 mL/min (ref >60)
GFR calc non Af Amer: >60 mL/min (ref >60)
Glucose, Bld: 83 mg/dL (ref 70–99)
Potassium: 4.4 mmol/L (ref 3.5–5.1)
Sodium: 134 mmol/L — ABNORMAL LOW (ref 135–145)

## 2018-12-06 LAB — CLOSTRIDIUM DIFFICILE BY PCR, REFLEXED: Toxigenic C. Difficile by PCR: POSITIVE — AB

## 2018-12-06 MED ORDER — CIPROFLOXACIN HCL 250 MG PO TABS: 250.0000 mg | ORAL_TABLET | Freq: Two times a day (BID) | ORAL | 0 refills | Status: AC

## 2018-12-06 MED ORDER — DIPHENOXYLATE-ATROPINE 2.5-0.025 MG PO TABS: 1.0000 | ORAL_TABLET | Freq: Four times a day (QID) | ORAL | 0 refills | Status: DC | PRN

## 2018-12-06 MED ORDER — METRONIDAZOLE 500 MG PO TABS: 500.0000 mg | ORAL_TABLET | Freq: Three times a day (TID) | ORAL | 0 refills | Status: AC

## 2018-12-06 MED ORDER — AMLODIPINE BESYLATE 5 MG PO TABS: 2.5000 mg | ORAL_TABLET | Freq: Every day | ORAL | Status: AC

## 2018-12-06 MED ORDER — SACCHAROMYCES BOULARDII 250 MG PO CAPS: 250.0000 mg | ORAL_CAPSULE | Freq: Two times a day (BID) | ORAL | 0 refills | Status: DC

## 2018-12-06 MED ORDER — DIPHENOXYLATE-ATROPINE 2.5-0.025 MG PO TABS
1.0000 | ORAL_TABLET | Freq: Four times a day (QID) | ORAL | Status: DC | PRN
  Administered 2018-12-06: 15:00:00 1 via ORAL

## 2018-12-06 NOTE — Progress Notes (Signed)
Occupational Therapy Treatment Patient Details Name: [name] MRN: [id number] DOB: [birth year] Today's Date: 12/06/2018    History of present illness 85 yo female admitted with SBO. Hx of diverticular abscess, fibromyalgia, osteoporosis, breast cancer, s/p mastectomy   OT comments  Pt progressing towards OT goals. Performing mobility in room this session pushing iv pole with minguard-supervision throughout. Pt completing standing grooming ADL at supervision level. Performed additional seated UB/LB exercise for continued strengthening/endurance. Feel POC remains appropriate at this time. Pt eager for discharge home soon. Will continue to follow while she remains in acute setting.   Follow Up Recommendations  No OT follow up    Equipment Recommendations  None recommended by OT          Precautions / Restrictions Precautions Precautions: Fall Restrictions Weight Bearing Restrictions: No       Mobility Bed Mobility Overal bed mobility: Needs Assistance Bed Mobility: Supine to Sit     Supine to sit: Supervision;HOB elevated     General bed mobility comments: for lines, safety  Transfers Overall transfer level: Needs assistance Equipment used: None Transfers: Sit to/from Stand Sit to Stand: Supervision         General transfer comment: for safety    Balance Overall balance assessment: Needs assistance Sitting-balance support: Bilateral upper extremity supported;Feet supported Sitting balance-Leahy Scale: Good     Standing balance support: During functional activity;No upper extremity supported;Single extremity supported Standing balance-Leahy Scale: Poor Standing balance comment: reliant on at least unilateral UE support                            ADL either performed or assessed with clinical judgement   ADL Overall ADL's : Needs assistance/impaired     Grooming: Wash/dry hands;Supervision/safety;Standing                                Functional mobility during ADLs: Min guard;Supervision/safety                         Cognition Arousal/Alertness: Awake/alert Behavior During Therapy: WFL for tasks assessed/performed Overall Cognitive Status: Within Functional Limits for tasks assessed                                          Exercises Exercises: General Upper Extremity;General Lower Extremity;Other exercises General Exercises - Upper Extremity Shoulder Flexion: AROM;Both;10 reps;Seated Elbow Flexion: AROM;Both;10 reps;Seated General Exercises - Lower Extremity Ankle Circles/Pumps: AROM;Both;10 reps;Seated Hip ABduction/ADduction: AROM;Both;10 reps;Seated Straight Leg Raises: AROM;Both;10 reps;Seated Other Exercises Other Exercises: shoulder rolls x10 bil UE Other Exercises: PNF diagonals, x10 bil UE seated   Shoulder Instructions       General Comments      Pertinent Vitals/ Pain       Pain Assessment: No/denies pain  Home Living                                          Prior Functioning/Environment              Frequency  Min 2X/week        Progress Toward Goals  OT Goals(current goals can now be found in the care  plan section)  Progress towards OT goals: Progressing toward goals  Acute Rehab OT Goals Patient Stated Goal: to get better and regain PLOF OT Goal Formulation: With patient Time For Goal Achievement: 12/16/18  Plan Discharge plan remains appropriate    Co-evaluation                 AM-PAC OT "6 Clicks" Daily Activity     Outcome Measure   Help from another person eating meals?: None Help from another person taking care of personal grooming?: A Little Help from another person toileting, which includes using toliet, bedpan, or urinal?: A Little Help from another person bathing (including washing, rinsing, drying)?: A Little Help from another person to put on and taking off regular upper body clothing?:  None Help from another person to put on and taking off regular lower body clothing?: A Little 6 Click Score: 20    End of Session    OT Visit Diagnosis: Muscle weakness (generalized) (M62.81)   Activity Tolerance Patient tolerated treatment well   Patient Left in chair;with call bell/phone within reach   Nurse Communication Mobility status        Time: 1610-9604 OT Time Calculation (min): 18 min  Charges: OT General Charges $OT Visit: 1 Visit OT Treatments $Self Care/Home Management : 8-22 mins  Lou Cal, OT Supplemental Rehabilitation Services Pager [phone] Office [phone]    Raymondo Band 12/06/2018, 12:31 PM

## 2018-12-06 NOTE — Discharge Summary (Addendum)
Physician Discharge Summary  [name] SUP:[id number] DOB: 10-07-1928 DOA: 12/02/2018  PCP: Crist Infante, MD  Admit date: 12/02/2018 Discharge date: 12/06/2018  Admitted From:  Home  Disposition: Home   Recommendations for Outpatient Follow-up:  1. Follow up with PCP in 1 weeks 2. Please follow up stool culture.  3. Consider rechecking C diff if she continues to have loose stools/diarrhea  Home Health: PT   Discharge Condition: STABLE  CODE STATUS: FULL    Brief Hospitalization Summary: Please see all hospital notes, images, labs for full details of the hospitalization. Dr. Aneta Mins HPI: [name] is a 85 y.o. female with medical history significant of essential hypertension, type 2 diabetes mellitus, HLD, GERD, anxiety, and history of diverticulitis with abscess presents with nausea, vomiting and progressive abdominal pain.  Patient reports onset of symptoms last night roughly 7 PM.  She was attempting to use the restroom and could not move her bowels.  She tried once again later that evening, with once again no desired effect.  She also reports significant nausea associated with vomiting, denies any hematemesis.  Pain is localized to bilateral lower quadrants and is crampy in nature.  Denies any radiation.  No other complaints or concerns at this time.  Denies headache, no fever/chills/night sweats, no diarrhea, no chest pain, no palpitations, no shortness of breath, no cough/congestion, no weakness, no paresthesias.  ED Course: Temperature 97.8, HR 109, RR 22, BP 139/63, SPO2 96% on room air.  WBC count 24.5, hemoglobin 15.0, platelets 254.  Sodium 139, potassium 4.0, chloride 104, CO2 23, BUN 25, creatinine 0.9, glucose 209.  Lipase 48.  ALT 19, AST 20, total bilirubin 0.6.  CT abdomen/pelvis notable for small bowel obstruction with transition point in the left lower quadrant adjacent to remote diverticular abscess.  Descending/sigmoid diverticulosis without diverticulitis.  No  appreciable abscess noted on CT.  Patient was given 1 L normal saline bolus and morphine for her abdominal pain.  She was referred for admission by ED provider for small bowel obstruction.  Brief Narrative: 85 y.o.femalewith medical history significant ofessential hypertension, type 2 diabetes mellitus, HLD, GERD, anxiety, and history of diverticulitis with abscess presents with nausea, vomiting and progressive abdominal pain. Patient reports onset of symptoms last night roughly 7 PM. She was attempting to use the restroom and could not move her bowels. She tried once again later that evening, with once again no effect. She also reports significant nausea associated with vomiting. Pain is localized to bilateral lower quadrants and is crampy in nature. CT abdomen/pelvis notable for small bowel obstruction with transition point in the left lower quadrant adjacent to remote diverticular abscess. Descending/sigmoid diverticulosis without diverticulitis. No appreciable abscess noted on CT. Patient was given 1 L normal saline bolus and morphine for her abdominal pain. She was referred for admission by ED provider for small bowel obstruction.   Assessment & Plan:   Small bowel obstruction with left lower quadrant transition at the site of previous diverticular abscess/probable recurrent diverticulitis: SBO has resolved.  Having bowel movements.  Pt had some loose stools overnight and unfortunately a c diff was tested overnight.  Pt was antigen positive and toxin was negative.  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  I counseled her to monitor clinically and if her symptoms worsened or she developed fever or abdominal pain to see her PCP immediately.  Pt verbalized understanding.   Diverticulitis - WBC trending  down, Pt was treated with IV zosyn.  Pt started on probiotics.  Pt was followed by surgery service and report that patient can discharge  home today.  Pt to complete a full 10 day course of antibiotics.  Will discharge home on 5 days of cipro/flagyl with close outpatient follow up.   Soft diet recommended for next week.   Essential hypertension: Resumed home amlodipine/metoprolol.   Hiatal hernia with GERD: oral pepcid ordered in hospital.   Anxiety-stable-resumed  home alprazolam.  Type II diabetes mellitus with renal manifestations: cont SSI and CBG testing.  CKD stage III: Creatinine stable.  Monitor.  Hypokalemia- repleted.    DVT prophylaxis: SQ lovenox Code Status: full Disposition Plan: Home with HHPT, RN  Consultants:  CCS  Procedures: NGT insertion/removal  Discharge Diagnoses:  Principal Problem:   Small bowel obstruction ([place]) Active Problems:   Benign hypertensive heart disease without heart failure   Hypercholesterolemia   Hiatal hernia   GERD (gastroesophageal reflux disease)   Anxiety   Hyperglycemia   Type II diabetes mellitus with renal manifestations [facility])   Discharge Instructions: Discharge Instructions    Call MD for:  difficulty breathing, headache or visual disturbances   Complete by: As directed    Call MD for:  extreme fatigue   Complete by: As directed    Call MD for:  persistant dizziness or light-headedness   Complete by: As directed    Call MD for:  persistant nausea and vomiting   Complete by: As directed    Call MD for:  severe uncontrolled pain   Complete by: As directed    Increase activity slowly   Complete by: As directed      Allergies as of 12/06/2018   No Known Allergies     Medication List    STOP taking these medications   METAMUCIL PO   metFORMIN 500 MG tablet Commonly known as: Glucophage   sodium chloride flush 0.9 % Soln Commonly known as: NS     TAKE these medications   ALPRAZolam 0.25 MG tablet Commonly known as: XANAX Take 0.25 mg by mouth at bedtime as needed for anxiety or sleep.   amLODipine 5 MG tablet Commonly known  as: NORVASC Take 0.5 tablets (2.5 mg total) by mouth daily.   aspirin 325 MG EC tablet Take 325 mg by mouth daily. What changed: Another medication with the same name was removed. Continue taking this medication, and follow the directions you see here.   ciprofloxacin 250 MG tablet Commonly known as: CIPRO Take 1 tablet (250 mg total) by mouth 2 (two) times daily for 5 days.   Co Q-10 200 MG Caps Take 1 capsule by mouth daily.   diphenoxylate-atropine 2.5-0.025 MG tablet Commonly known as: LOMOTIL Take 1 tablet by mouth 4 (four) times daily as needed for diarrhea or loose stools.   ezetimibe 10 MG tablet Commonly known as: ZETIA Take 10 mg by mouth daily.   metoprolol succinate 25 MG 24 hr tablet Commonly known as: TOPROL-XL TAKE 1/2 TABLET BY MOUTH DAILY   metroNIDAZOLE 500 MG tablet Commonly known as: Flagyl Take 1 tablet (500 mg total) by mouth 3 (three) times daily for 5 days. DO NOT CONSUME ALCOHOL WHILE TAKING THIS MEDICATION.   multivitamin tablet Take 1 tablet by mouth daily.   omeprazole 20 MG capsule Commonly known as: PRILOSEC Take 1 capsule (20 mg total) by mouth daily.   raloxifene 60 MG tablet Commonly known as: EVISTA TAKE 1 TABLET BY MOUTH  ONCE DAILY   saccharomyces boulardii 250 MG capsule Commonly known as: FLORASTOR Take 1 capsule (250 mg total) by mouth 2 (two) times daily.   vitamin B-12 500 MCG tablet Commonly known as: CYANOCOBALAMIN Take 500 mcg by mouth daily.   vitamin C 500 MG tablet Commonly known as: ASCORBIC ACID Take 500 mg by mouth daily.   Vitamin D 1000 units capsule Take 1,000 Units by mouth daily.      Follow-up Information    Crist Infante, MD. Schedule an appointment as soon as possible for a visit in 1 week(s).   Specialty: Internal Medicine Why: Hospital Follow Up  Contact information: [address] [place] [address] [phone]          No Known Allergies Allergies as of 12/06/2018   No Known  Allergies     Medication List    STOP taking these medications   METAMUCIL PO   metFORMIN 500 MG tablet Commonly known as: Glucophage   sodium chloride flush 0.9 % Soln Commonly known as: NS     TAKE these medications   ALPRAZolam 0.25 MG tablet Commonly known as: XANAX Take 0.25 mg by mouth at bedtime as needed for anxiety or sleep.   amLODipine 5 MG tablet Commonly known as: NORVASC Take 0.5 tablets (2.5 mg total) by mouth daily.   aspirin 325 MG EC tablet Take 325 mg by mouth daily. What changed: Another medication with the same name was removed. Continue taking this medication, and follow the directions you see here.   ciprofloxacin 250 MG tablet Commonly known as: CIPRO Take 1 tablet (250 mg total) by mouth 2 (two) times daily for 5 days.   Co Q-10 200 MG Caps Take 1 capsule by mouth daily.   diphenoxylate-atropine 2.5-0.025 MG tablet Commonly known as: LOMOTIL Take 1 tablet by mouth 4 (four) times daily as needed for diarrhea or loose stools.   ezetimibe 10 MG tablet Commonly known as: ZETIA Take 10 mg by mouth daily.   metoprolol succinate 25 MG 24 hr tablet Commonly known as: TOPROL-XL TAKE 1/2 TABLET BY MOUTH DAILY   metroNIDAZOLE 500 MG tablet Commonly known as: Flagyl Take 1 tablet (500 mg total) by mouth 3 (three) times daily for 5 days. DO NOT CONSUME ALCOHOL WHILE TAKING THIS MEDICATION.   multivitamin tablet Take 1 tablet by mouth daily.   omeprazole 20 MG capsule Commonly known as: PRILOSEC Take 1 capsule (20 mg total) by mouth daily.   raloxifene 60 MG tablet Commonly known as: EVISTA TAKE 1 TABLET BY MOUTH ONCE DAILY   saccharomyces boulardii 250 MG capsule Commonly known as: FLORASTOR Take 1 capsule (250 mg total) by mouth 2 (two) times daily.   vitamin B-12 500 MCG tablet Commonly known as: CYANOCOBALAMIN Take 500 mcg by mouth daily.   vitamin C 500 MG tablet Commonly known as: ASCORBIC ACID Take 500 mg by mouth daily.    Vitamin D 1000 units capsule Take 1,000 Units by mouth daily.       Procedures/Studies: Dg Abd 1 View  Result Date: 12/04/2018 CLINICAL DATA:  85 year old female with small bowel obstruction on CT Abdomen and Pelvis 12/02/2018. EXAM: ABDOMEN - 1 VIEW COMPARISON:  Post oral contrast images 12/02/2018, prior CT. FINDINGS: KUB at 0519 hours. Stable enteric tube with side hole at the level of the gastric body. The previous oral contrast is now present throughout nondilated large bowel to the rectum. No residual in small bowel. And no dilated small bowel loops are  evident now. Negative lung bases. Stable visualized osseous structures. IMPRESSION: 1. Stable enteric tube. 2. Resolved small bowel obstruction gas pattern. Residual contrast throughout the large bowel. Electronically Signed   By: Genevie Ann M.D.   On: 12/04/2018 07:39   Ct Abdomen Pelvis W Contrast  Result Date: 12/02/2018 CLINICAL DATA:  Abdominal pain, diverticulitis suspected. Right and left lower quadrant abdominal pain for 6 hours. Nausea and vomiting. EXAM: CT ABDOMEN AND PELVIS WITH CONTRAST TECHNIQUE: Multidetector CT imaging of the abdomen and pelvis was performed using the standard protocol following bolus administration of intravenous contrast. CONTRAST:  69mL OMNIPAQUE IOHEXOL 300 MG/ML SOLN, 28mL OMNIPAQUE IOHEXOL 300 MG/ML SOLN COMPARISON:  CT of the abdomen pelvis 01/03/2018 FINDINGS: Lower chest: No acute abnormality. Hepatobiliary: No focal liver abnormality is seen. No gallstones, gallbladder wall thickening, or biliary dilatation. Pancreas: There is mild dilation of the pancreatic duct without an obstructing lesion. Calcifications are present. No mass lesion is present. Spleen: Normal in size without focal abnormality. Adrenals/Urinary Tract: The adrenal glands are normal bilaterally. Kidneys are atrophic. No stone or mass lesion is present. Ureters are within normal limits. The urinary bladder is normal. Stomach/Bowel: A large  hiatal hernia is present. Stomach is mildly dilated. Duodenum is unremarkable. A proximal small bowel obstruction is present. Transition point is in the left lower quadrant. This is adjacent to the area where the previous diverticular abscess was present. There is no focal collection. There is abrupt narrowing of the small bowel. The more distal small bowel is collapsed. The colon is mostly collapsed. Diverticular changes are noted within the distal descending and sigmoid colon without focal inflammation Vascular/Lymphatic: Atherosclerotic changes are noted in the aorta without aneurysm. No significant retroperitoneal adenopathy is present. Reproductive: Status post hysterectomy. No adnexal masses. Other: A small amount of free fluid is present adjacent to the distal obstructed small bowel. There is no free air. No significant ventral hernias are present. Musculoskeletal: Levoconvex scoliosis is present in the lumbar spine. Associated degenerative changes are present. No focal lytic or blastic lesions are present. Bony pelvis is intact. The hips are located. Degenerative changes are worse on the right. IMPRESSION: 1. Small bowel obstruction. Transition point is in the left lower quadrant. This is adjacent to the area of the remote diverticular abscess. This most likely is related to adhesions. Although no discrete mass lesion is present, small bowel neoplasm is also considered. 2. Distal descending and sigmoid diverticulosis without diverticulitis. No residual abscess or fluid collection. 3.  Aortic Atherosclerosis (ICD10-I70.0). 4. Scoliosis and degenerative changes of the lumbar spine. Electronically Signed   By: San Morelle M.D.   On: 12/02/2018 04:32   Dg Chest Port 1 View  Result Date: 12/02/2018 CLINICAL DATA:  Cough EXAM: PORTABLE CHEST 1 VIEW COMPARISON:  04/11/2011 FINDINGS: Grossly unchanged cardiac silhouette and mediastinal contours. Large retrocardiac air and fluid containing structure  compatible with a hiatal hernia, unchanged. No focal airspace opacities. No pleural effusion or pneumothorax. No evidence of edema. No acute osseous abnormalities. Post left-sided mastectomy and left axillary adenectomy. IMPRESSION: 1.  No acute cardiopulmonary disease. 2. Unchanged large hiatal hernia. Electronically Signed   By: Sandi Mariscal M.D.   On: 12/02/2018 08:06   Dg Abd Portable 1v-small Bowel Obstruction Protocol-initial, 8 Hr Delay  Result Date: 12/02/2018 CLINICAL DATA:  Small bowel obstruction, 8 hour post-contrast film. EXAM: PORTABLE ABDOMEN - 1 VIEW COMPARISON:  Radiographs and CT earlier this day. FINDINGS: Administered enteric contrast is seen within dilated small bowel, as  well as colon. There is enteric contrast within the ascending, transverse, and descending colon. Persistent small bowel dilatation in the lower abdomen at 3.1 cm. Enteric tube remains in place. Excreted IV contrast in the urinary bladder. IMPRESSION: Administered enteric contrast has reached the colon, with contrast in the ascending, transverse, and descending colon. Persistent small bowel dilatation in the lower abdomen. Electronically Signed   By: Keith Rake M.D.   On: 12/02/2018 21:28   Dg Abd Portable 1v-small Bowel Protocol-position Verification  Result Date: 12/02/2018 CLINICAL DATA:  85 year old female with NG tube placement. EXAM: PORTABLE ABDOMEN - 1 VIEW COMPARISON:  CT of the abdomen pelvis dated 12/02/2018 FINDINGS: An enteric tube is partially visualized with side port in the body of the stomach and tip in the distal stomach. Dilated loops of air-filled small bowel in the lower abdomen and pelvis measure up to 3.8 cm in diameter. Excreted contrast noted in the renal collecting system and within the urinary bladder. There is degenerative changes of the spine. The soft tissues are grossly unremarkable. IMPRESSION: 1. Enteric tube with tip in the distal stomach. 2. Persistent dilatation of distal small  bowel loops. Continued follow-up recommended. Electronically Signed   By: Anner Crete M.D.   On: 12/02/2018 10:54      Subjective: Pt says she feels great today.  Her stools have slowed down and she has no abdominal pain or fever and she is tolerating soft diet.   Discharge Exam: Vitals:   12/06/18 0500 12/06/18 0506  BP: 135/70 135/70  Pulse: 93 92  Resp: 18 18  Temp: 97.6 F (36.4 C) 97.6 F (36.4 C)  SpO2: 98% 98%   Vitals:   12/05/18 1331 12/05/18 2223 12/06/18 0500 12/06/18 0506  BP: (!) 157/81 (!) 142/72 135/70 135/70  Pulse: 85 78 93 92  Resp: 15 16 18 18   Temp: 98.3 F (36.8 C) 97.7 F (36.5 C) 97.6 F (36.4 C) 97.6 F (36.4 C)  TempSrc: Oral Oral Oral Oral  SpO2: 97% 99% 98% 98%  Weight:      Height:       General: Pt is alert, awake, not in acute distress Cardiovascular: RRR, S1/S2 +, no rubs, no gallops Respiratory: CTA bilaterally, no wheezing, no rhonchi Abdominal: Soft, NT, ND, bowel sounds + Extremities: no edema, no cyanosis   The results of significant diagnostics from this hospitalization (including imaging, microbiology, ancillary and laboratory) are listed below for reference.     Microbiology: Recent Results (from the past 240 hour(s))  SARS Coronavirus 2 (CEPHEID - Performed in [place] hospital lab), Hosp Order     Status: None   Collection Time: 12/02/18  5:40 AM   Specimen: Nasopharyngeal Swab  Result Value Ref Range Status   SARS Coronavirus 2 NEGATIVE NEGATIVE Final    Comment: (NOTE) If result is NEGATIVE SARS-CoV-2 target nucleic acids are NOT DETECTED. The SARS-CoV-2 RNA is generally detectable in upper and lower  respiratory specimens during the acute phase of infection. The lowest  concentration of SARS-CoV-2 viral copies this assay can detect is 250  copies / mL. A negative result does not preclude SARS-CoV-2 infection  and should not be used as the sole basis for treatment or other  patient management decisions.  A  negative result may occur with  improper specimen collection / handling, submission of specimen other  than nasopharyngeal swab, presence of viral mutation(s) within the  areas targeted by this assay, and inadequate number of viral copies  (<250 copies /  mL). A negative result must be combined with clinical  observations, patient history, and epidemiological information. If result is POSITIVE SARS-CoV-2 target nucleic acids are DETECTED. The SARS-CoV-2 RNA is generally detectable in upper and lower  respiratory specimens dur ing the acute phase of infection.  Positive  results are indicative of active infection with SARS-CoV-2.  Clinical  correlation with patient history and other diagnostic information is  necessary to determine patient infection status.  Positive results do  not rule out bacterial infection or co-infection with other viruses. If result is PRESUMPTIVE POSTIVE SARS-CoV-2 nucleic acids MAY BE PRESENT.   A presumptive positive result was obtained on the submitted specimen  and confirmed on repeat testing.  While 2019 novel coronavirus  (SARS-CoV-2) nucleic acids may be present in the submitted sample  additional confirmatory testing may be necessary for epidemiological  and / or clinical management purposes  to differentiate between  SARS-CoV-2 and other Sarbecovirus currently known to infect humans.  If clinically indicated additional testing with an alternate test  methodology [phone]) is advised. The SARS-CoV-2 RNA is generally  detectable in upper and lower respiratory sp ecimens during the acute  phase of infection. The expected result is Negative. Fact Sheet for Patients:  StrictlyIdeas.no Fact Sheet for Healthcare Providers: BankingDealers.co.za This test is not yet approved or cleared by the [place] FDA and has been authorized for detection and/or diagnosis of SARS-CoV-2 by FDA under an Emergency Use  Authorization (EUA).  This EUA will remain in effect (meaning this test can be used) for the duration of the COVID-19 declaration under Section 564(b)(1) of the Act, 21 U.S.C. section 360bbb-3(b)(1), unless the authorization is terminated or revoked sooner. Performed at [facility] [place] [address], [place], [place] [address]   C difficile quick scan w PCR reflex     Status: Abnormal   Collection Time: 12/05/18  9:14 PM   Specimen: Stool  Result Value Ref Range Status   C Diff antigen POSITIVE (A) NEGATIVE Final   C Diff toxin NEGATIVE NEGATIVE Final   C Diff interpretation Results are indeterminate. See PCR results.  Final    Comment: Performed at [facility], [place] [address], [place], [place] [address]  C. Diff by PCR, Reflexed     Status: Abnormal   Collection Time: 12/05/18  9:14 PM  Result Value Ref Range Status   Toxigenic C. Difficile by PCR POSITIVE (A) NEGATIVE Final    Comment: Positive for toxigenic C. difficile with little to no toxin production. Only treat if clinical presentation suggests symptomatic illness. Performed at [facility], [place] [address] [place], [place] [address]      Labs: BNP (last 3 results) Recent Labs    12/19/17 2053  BNP 542.7*   Basic Metabolic Panel: Recent Labs  Lab 12/02/18 0138 12/02/18 0848 12/03/18 0331 12/04/18 0416 12/05/18 0409 12/06/18 0303  NA 139  --  141 143 138 134*  K 4.0  --  3.6 3.1* 4.5 4.4  CL 104  --  106 105 109 107  CO2 23  --  26 24 20* 19*  GLUCOSE 209*  --  161* 119* 117* 83  BUN 25*  --  24* 29* 23 18  CREATININE 0.89 0.99 1.12* 1.02* 0.77 0.78  CALCIUM 9.5  --  8.2* 8.6* 8.2* 7.7*  MG  --   --  2.1  --   --   --    Liver Function Tests: Recent Labs  Lab 12/02/18 0138  AST 20  ALT 19  ALKPHOS 49  BILITOT 0.6  PROT 7.9  ALBUMIN 4.5   Recent Labs  Lab 12/02/18 0138  LIPASE 48   No results for input(s): AMMONIA in the last 168 hours. CBC: Recent  Labs  Lab 12/02/18 0138 12/02/18 0848 12/03/18 0331 12/04/18 0416 12/05/18 0409 12/06/18 0303  WBC 24.5* 15.7* 15.2* 14.8* 15.7* 11.6*  NEUTROABS 22.0*  --   --   --  13.1* 8.6*  HGB 15.0 13.6 12.3 13.2 12.6 11.6*  HCT 45.9 43.4 39.4 41.6 39.2 37.7  MCV 91.4 92.5 92.3 92.7 93.6 95.2  PLT 254 237 208 218 200 196   Cardiac Enzymes: No results for input(s): CKTOTAL, CKMB, CKMBINDEX, TROPONINI in the last 168 hours. BNP: Invalid input(s): POCBNP CBG: Recent Labs  Lab 12/05/18 1147 12/05/18 1622 12/05/18 2238 12/06/18 0722 12/06/18 0907  GLUCAP 136* 91 92 62* 147*   D-Dimer No results for input(s): DDIMER in the last 72 hours. Hgb A1c No results for input(s): HGBA1C in the last 72 hours. Lipid Profile No results for input(s): CHOL, HDL, LDLCALC, TRIG, CHOLHDL, LDLDIRECT in the last 72 hours. Thyroid function studies No results for input(s): TSH, T4TOTAL, T3FREE, THYROIDAB in the last 72 hours.  Invalid input(s): FREET3 Anemia work up No results for input(s): VITAMINB12, FOLATE, FERRITIN, TIBC, IRON, RETICCTPCT in the last 72 hours. Urinalysis    Component Value Date/Time   COLORURINE YELLOW 12/02/2018 0138   APPEARANCEUR CLEAR 12/02/2018 0138   LABSPEC 1.031 (H) 12/02/2018 0138   PHURINE 7.0 12/02/2018 0138   GLUCOSEU 50 (A) 12/02/2018 0138   HGBUR NEGATIVE 12/02/2018 0138   BILIRUBINUR NEGATIVE 12/02/2018 0138   KETONESUR 5 (A) 12/02/2018 0138   PROTEINUR NEGATIVE 12/02/2018 0138   NITRITE NEGATIVE 12/02/2018 0138   LEUKOCYTESUR NEGATIVE 12/02/2018 0138   Sepsis Labs Invalid input(s): PROCALCITONIN,  WBC,  LACTICIDVEN Microbiology Recent Results (from the past 240 hour(s))  SARS Coronavirus 2 (CEPHEID - Performed in [place] hospital lab), Hosp Order     Status: None   Collection Time: 12/02/18  5:40 AM   Specimen: Nasopharyngeal Swab  Result Value Ref Range Status   SARS Coronavirus 2 NEGATIVE NEGATIVE Final    Comment: (NOTE) If result is  NEGATIVE SARS-CoV-2 target nucleic acids are NOT DETECTED. The SARS-CoV-2 RNA is generally detectable in upper and lower  respiratory specimens during the acute phase of infection. The lowest  concentration of SARS-CoV-2 viral copies this assay can detect is 250  copies / mL. A negative result does not preclude SARS-CoV-2 infection  and should not be used as the sole basis for treatment or other  patient management decisions.  A negative result may occur with  improper specimen collection / handling, submission of specimen other  than nasopharyngeal swab, presence of viral mutation(s) within the  areas targeted by this assay, and inadequate number of viral copies  (<250 copies / mL). A negative result must be combined with clinical  observations, patient history, and epidemiological information. If result is POSITIVE SARS-CoV-2 target nucleic acids are DETECTED. The SARS-CoV-2 RNA is generally detectable in upper and lower  respiratory specimens dur ing the acute phase of infection.  Positive  results are indicative of active infection with SARS-CoV-2.  Clinical  correlation with patient history and other diagnostic information is  necessary to determine patient infection status.  Positive results do  not rule out bacterial infection or co-infection with other viruses. If result is PRESUMPTIVE POSTIVE  SARS-CoV-2 nucleic acids MAY BE PRESENT.   A presumptive positive result was obtained on the submitted specimen  and confirmed on repeat testing.  While 2019 novel coronavirus  (SARS-CoV-2) nucleic acids may be present in the submitted sample  additional confirmatory testing may be necessary for epidemiological  and / or clinical management purposes  to differentiate between  SARS-CoV-2 and other Sarbecovirus currently known to infect humans.  If clinically indicated additional testing with an alternate test  methodology [phone]) is advised. The SARS-CoV-2 RNA is generally  detectable  in upper and lower respiratory sp ecimens during the acute  phase of infection. The expected result is Negative. Fact Sheet for Patients:  StrictlyIdeas.no Fact Sheet for Healthcare Providers: BankingDealers.co.za This test is not yet approved or cleared by the [place] FDA and has been authorized for detection and/or diagnosis of SARS-CoV-2 by FDA under an Emergency Use Authorization (EUA).  This EUA will remain in effect (meaning this test can be used) for the duration of the COVID-19 declaration under Section 564(b)(1) of the Act, 21 U.S.C. section 360bbb-3(b)(1), unless the authorization is terminated or revoked sooner. Performed at [facility], [place] [address], [place], [place] [address]   C difficile quick scan w PCR reflex     Status: Abnormal   Collection Time: 12/05/18  9:14 PM   Specimen: Stool  Result Value Ref Range Status   C Diff antigen POSITIVE (A) NEGATIVE Final   C Diff toxin NEGATIVE NEGATIVE Final   C Diff interpretation Results are indeterminate. See PCR results.  Final    Comment: Performed at [facility], [place] [address], [place], [place] [address]  C. Diff by PCR, Reflexed     Status: Abnormal   Collection Time: 12/05/18  9:14 PM  Result Value Ref Range Status   Toxigenic C. Difficile by PCR POSITIVE (A) NEGATIVE Final    Comment: Positive for toxigenic C. difficile with little to no toxin production. Only treat if clinical presentation suggests symptomatic illness. Performed at [facility], [place] [address], [place],  [address]    Time coordinating discharge: 34 minutes   SIGNED:  Irwin Brakeman, MD  [facility] 12/06/2018, 10:20 AM How to contact the [facility] Attending or Consulting provider [place] or covering provider during after hours [place], for this patient?  1. Check the care team in [facility] and look for a) attending/consulting TRH provider  listed and b) the [facility] team listed 2. Log into [email or web address] and use [place]'s universal password to access. If you do not have the password, please contact the hospital operator. 3. Locate the [facility] provider you are looking for under [facility] and page to a number that you can be directly reached. 4. If you still have difficulty reaching the provider, please page the [facility] (Director on Call) for the Hospitalists listed on amion for assistance.

## 2018-12-06 NOTE — Progress Notes (Signed)
Spoke with on call about patient having liquid BM, and incontinence episode, new orders obtained.   Norlene Duel RN, BSN

## 2018-12-06 NOTE — TOC Transition Note (Signed)
Transition of Care [facility]) - CM/SW Discharge Note   Patient Details  Name: [name] MRN: [id number] Date of Birth: 1928/11/17  Transition of Care [facility]) CM/SW Contact:  Dessa Phi, RN Phone Number: 12/06/2018, 12:09 PM   Clinical Narrative: Spoke to patient about d/c plans-informed she was recc for HHRN/PT-she pleasantly declines HHC-states she lives alone, but has friends to asst,she is very independent,even drives,has a pcp she trusts if she needed [place] later she would call her pcp.Has transp home. No further CM needs.      Final next level of care: Home/Self Care Barriers to Discharge: No Barriers Identified   Patient Goals and CMS Choice Patient states their goals for this hospitalization and ongoing recovery are:: to go home      Discharge Placement                       Discharge Plan and Services   Discharge Planning Services: CM Consult            DME Arranged: N/A DME Agency: NA       HH Arranged: Refused [place] Agency: NA        Social Determinants of Health (SDOH) Interventions     Readmission Risk Interventions No flowsheet data found.

## 2018-12-06 NOTE — Progress Notes (Signed)
Discharge instructions discussed with patient, verbalized agreement and understanding 

## 2018-12-06 NOTE — Discharge Instructions (Signed)
Please continue soft diet for next week.  Please notify your primary care doctor if loose stools get worse.  Please follow up with primary care provider in 1 week.  Bowel Obstruction A bowel obstruction means that something is blocking the small or large bowel. The bowel is also called the intestine. It is the long tube that connects the stomach to the opening of the butt (anus). When something blocks the bowel, food and fluids cannot pass through like normal. This condition needs to be treated. Treatment depends on the cause of the problem and how bad the problem is. What are the causes? Common causes of this condition include:  Scar tissue (adhesions) from past surgery or from high-energy X-rays (radiation).  Recent surgery in the belly. This affects how food moves in the bowel.  Some diseases, such as: ? Irritation of the lining of the digestive tract (Crohn's disease). ? Irritation of small pouches in the bowel (diverticulitis).  Growths or tumors.  A bulging organ (hernia).  Twisting of the bowel (volvulus).  A foreign body.  Slipping of a part of the bowel into another part (intussusception). What are the signs or symptoms? Symptoms of this condition include:  Pain in the belly.  Feeling sick to your stomach (nauseous).  Throwing up (vomiting).  Bloating in the belly.  Being unable to pass gas.  Trouble pooping (constipation).  Watery poop (diarrhea).  A lot of belching. How is this diagnosed? This condition may be diagnosed based on:  A physical exam.  Medical history.  Imaging tests, such as X-ray or CT scan.  Blood tests.  Urine tests. How is this treated? Treatment for this condition may include:  Fluids and pain medicines that are given through an IV tube. Your doctor may tell you not to eat or drink if you feel sick to your stomach and are throwing up.  Eating a clear liquid diet for a few days.  Putting a small tube (nasogastric tube) into  the stomach. This will help with pain, discomfort, and nausea by removing blocked air and fluids from the stomach.  Surgery. This may be needed if other treatments do not work. Follow these instructions at home: Medicines  Take over-the-counter and prescription medicines only as told by your doctor.  If you were prescribed an antibiotic medicine, take it as told by your doctor. Do not stop taking the antibiotic even if you start to feel better. General instructions  Follow your diet as told by your doctor. You may need to: ? Only drink clear liquids until you start to get better. ? Avoid solid foods.  Return to your normal activities as told by your doctor. Ask your doctor what activities are safe for you.  Do not sit for a long time without moving. Get up to take short walks every 1-2 hours. This is important. Ask for help if you feel weak or unsteady.  Keep all follow-up visits as told by your doctor. This is important. How is this prevented? After having a bowel obstruction, you may be more likely to have another. You can do some things to stop it from happening again.  If you have a long-term (chronic) disease, contact your doctor if you see changes or problems.  Take steps to prevent or treat trouble pooping. Your doctor may ask that you: ? Drink enough fluid to keep your pee (urine) pale yellow. ? Take over-the-counter or prescription medicines. ? Eat foods that are high in fiber. These include beans, whole  grains, and fresh fruits and vegetables. ? Limit foods that are high in fat and sugar. These include fried or sweet foods.  Stay active. Ask your doctor which exercises are safe for you.  Avoid stress.  Eat three small meals and three small snacks each day.  Work with a Publishing rights manager (dietitian) to make a meal plan that works for you.  Do not use any products that contain nicotine or tobacco, such as cigarettes and e-cigarettes. If you need help quitting, ask your  doctor. Contact a doctor if:  You have a fever.  You have chills. Get help right away if:  You have pain or cramps that get worse.  You throw up blood.  You are sick to your stomach.  You cannot stop throwing up.  You cannot drink fluids.  You feel mixed up (confused).  You feel very thirsty (dehydrated).  Your belly gets more bloated.  You feel weak or you pass out (faint). Summary  A bowel obstruction means that something is blocking the small or large bowel.  Treatment may include IV fluids and pain medicine. You may also have a clear liquid diet, a small tube in your stomach, or surgery.  Drink clear liquids and avoid solid foods until you get better. This information is not intended to replace advice given to you by your health care provider. Make sure you discuss any questions you have with your health care provider. Document Released: 06/22/2004 Document Revised: 09/26/2017 Document Reviewed: 09/26/2017 Elsevier Patient Education  [place].  Diverticulitis  Diverticulitis is when small pockets in your large intestine (colon) get infected or swollen. This causes stomach pain and watery poop (diarrhea). These pouches are called diverticula. They form in people who have a condition called diverticulosis. Follow these instructions at home: Medicines  Take over-the-counter and prescription medicines only as told by your doctor. These include: ? Antibiotics. ? Pain medicines. ? Fiber pills. ? Probiotics. ? Stool softeners.  Do not drive or use heavy machinery while taking prescription pain medicine.  If you were prescribed an antibiotic, take it as told. Do not stop taking it even if you feel better. General instructions   Follow a diet as told by your doctor.  When you feel better, your doctor may tell you to change your diet. You may need to eat a lot of fiber. Fiber makes it easier to poop (have bowel movements). Healthy foods with fiber  include: ? Berries. ? Beans. ? Lentils. ? Green vegetables.  Exercise 3 or more times a week. Aim for 30 minutes each time. Exercise enough to sweat and make your heart beat faster.  Keep all follow-up visits as told. This is important. You may need to have an exam of the large intestine. This is called a colonoscopy. Contact a doctor if:  Your pain does not get better.  You have a hard time eating or drinking.  You are not pooping like normal. Get help right away if:  Your pain gets worse.  Your problems do not get better.  Your problems get worse very fast.  You have a fever.  You throw up (vomit) more than one time.  You have poop that is: ? Bloody. ? Black. ? Tarry. Summary  Diverticulitis is when small pockets in your large intestine (colon) get infected or swollen.  Take medicines only as told by your doctor.  Follow a diet as told by your doctor. This information is not intended to replace advice given to  you by your health care provider. Make sure you discuss any questions you have with your health care provider. Document Released: 11/01/2007 Document Revised: 04/27/2017 Document Reviewed: 06/01/2016 Elsevier Patient Education  2020 [place].   IMPORTANT INFORMATION: PAY CLOSE ATTENTION   PHYSICIAN DISCHARGE INSTRUCTIONS  Follow with Primary care provider  Crist Infante, MD  and other consultants as instructed by your Hospitalist Physician  [facility] IF SYMPTOMS COME BACK, WORSEN OR NEW PROBLEM DEVELOPS   Please note: You were cared for by a hospitalist during your hospital stay. Every effort will be made to forward records to your primary care provider.  You can request that your primary care provider send for your hospital records if they have not received them.  Once you are discharged, your primary care physician will handle any further medical issues. Please note that NO REFILLS for any discharge medications  will be authorized once you are discharged, as it is imperative that you return to your primary care physician (or establish a relationship with a primary care physician if you do not have one) for your post hospital discharge needs so that they can reassess your need for medications and monitor your lab values.  Please get a complete blood count and chemistry panel checked by your Primary MD at your next visit, and again as instructed by your Primary MD.  Get Medicines reviewed and adjusted: Please take all your medications with you for your next visit with your Primary MD  Laboratory/radiological data: Please request your Primary MD to go over all hospital tests and procedure/radiological results at the follow up, please ask your primary care provider to get all Hospital records sent to his/her office.  In some cases, they will be blood work, cultures and biopsy results pending at the time of your discharge. Please request that your primary care provider follow up on these results.  If you are diabetic, please bring your blood sugar readings with you to your follow up appointment with primary care.    Please call and make your follow up appointments as soon as possible.    Also Note the following: If you experience worsening of your admission symptoms, develop shortness of breath, life threatening emergency, suicidal or homicidal thoughts you must seek medical attention immediately by calling 911 or calling your MD immediately  if symptoms less severe.  You must read complete instructions/literature along with all the possible adverse reactions/side effects for all the Medicines you take and that have been prescribed to you. Take any new Medicines after you have completely understood and accpet all the possible adverse reactions/side effects.   Do not drive when taking Pain medications or sleeping medications (Benzodiazepines)  Do not take more than prescribed Pain, Sleep and Anxiety  Medications. It is not advisable to combine anxiety,sleep and pain medications without talking with your primary care practitioner  Special Instructions: If you have smoked or chewed Tobacco  in the last 2 yrs please stop smoking, stop any regular Alcohol  and or any Recreational drug use.  Wear Seat belts while driving.  Do not drive if taking any narcotic, mind altering or controlled substances or recreational drugs or alcohol.

## 2018-12-06 NOTE — Progress Notes (Addendum)
    CC:  Subjective: She feels better, she has a hx of "colitis," she doesn't know if she had C Diff.  She feel better and wants to go home.  Objective: Vital signs in last 24 hours: Temp:  [97.6 F (36.4 C)-98.3 F (36.8 C)] 97.6 F (36.4 C) (07/10 0506) Pulse Rate:  [78-93] 92 (07/10 0506) Resp:  [15-18] 18 (07/10 0506) BP: (135-157)/(70-81) 135/70 (07/10 0506) SpO2:  [97 %-99 %] 98 % (07/10 0506) Last BM Date: 12/05/18 240 PO recorded 800 IV 200 urine (voided x 6 recorded) Stool x 4 Afebrile, VSS WBC down 11.6 Intake/Output from previous day: 07/09 0701 - 07/10 0700 In: 1032.4 [P.O.:240; I.V.:558.1; IV Piggyback:234.3] Out: 200 [Urine:200] Intake/Output this shift: No intake/output data recorded.  General appearance: alert, cooperative and no distress Resp: clear to auscultation bilaterally GI: soft, no discomfort, + BM's  Lab Results:  Recent Labs    12/05/18 0409 12/06/18 0303  WBC 15.7* 11.6*  HGB 12.6 11.6*  HCT 39.2 37.7  PLT 200 196    BMET Recent Labs    12/05/18 0409 12/06/18 0303  NA 138 134*  K 4.5 4.4  CL 109 107  CO2 20* 19*  GLUCOSE 117* 83  BUN 23 18  CREATININE 0.77 0.78  CALCIUM 8.2* 7.7*   PT/INR No results for input(s): LABPROT, INR in the last 72 hours.  Recent Labs  Lab 12/02/18 0138  AST 20  ALT 19  ALKPHOS 49  BILITOT 0.6  PROT 7.9  ALBUMIN 4.5     Lipase     Component Value Date/Time   LIPASE 48 12/02/2018 0138     Medications: . amLODipine  2.5 mg Oral Daily  . enoxaparin (LOVENOX) injection  30 mg Subcutaneous Q24H  . famotidine  20 mg Oral Daily  . insulin aspart  0-9 Units Subcutaneous TID WC  . metoprolol succinate  12.5 mg Oral Daily  . saccharomyces boulardii  250 mg Oral BID    Assessment/Plan Type 2 diabetes GERD Hyperlipidemia COVID negative Dehydration - creatinine 0.89 >>1.21>>1.02>>0.77(7/9)>>0.78(7/10)  Smallbowel obstruction transition point left lower quadrant adjacent to  prior diverticular abscess/probable recurrent diverticulitis Diverticular abscess with IR drain placement 12/26/2017 - Leukocytosis 24.5>>15.7>>15.2>>14.8>>15.7(7/9)>>11.6(7/10) C Diff positive(antigen/toxinigenic, Negative Toxin) 12/05/18  FEN: clear liquids/IV fluids ID: Zosyn 7/6 >> day 5 DVT: SCDs/Lovenox Follow-up:PCP POC: Griffin,Ann Friend [phone]    Plan:  We want her to get 10 days Rx for her diverticulitis. Dr. Tery Sanfilippo is going to check with ID and discuss need to treat this.  He will be sure to give her 10 days Rx for her diverticulitis.  I will advance her diet.  WBC is trending back to normal.  She can follow up with PCP.       LOS: 4 days    Dub Maclellan 12/06/2018 [phone]

## 2018-12-10 LAB — STOOL CULTURE REFLEX - CMPCXR

## 2018-12-10 LAB — STOOL CULTURE: E coli, Shiga toxin Assay: NEGATIVE

## 2018-12-10 LAB — STOOL CULTURE REFLEX - RSASHR

## 2018-12-12 DIAGNOSIS — K219 Gastro-esophageal reflux disease without esophagitis: Secondary | ICD-10-CM | POA: Diagnosis not present

## 2018-12-12 DIAGNOSIS — D72829 Elevated white blood cell count, unspecified: Secondary | ICD-10-CM | POA: Diagnosis not present

## 2018-12-12 DIAGNOSIS — I10 Essential (primary) hypertension: Secondary | ICD-10-CM | POA: Diagnosis not present

## 2018-12-12 DIAGNOSIS — R197 Diarrhea, unspecified: Secondary | ICD-10-CM | POA: Diagnosis not present

## 2018-12-12 DIAGNOSIS — K56609 Unspecified intestinal obstruction, unspecified as to partial versus complete obstruction: Secondary | ICD-10-CM | POA: Diagnosis not present

## 2018-12-13 DIAGNOSIS — R197 Diarrhea, unspecified: Secondary | ICD-10-CM | POA: Diagnosis not present

## 2019-03-08 DIAGNOSIS — Z23 Encounter for immunization: Secondary | ICD-10-CM | POA: Diagnosis not present

## 2019-03-27 DIAGNOSIS — E1169 Type 2 diabetes mellitus with other specified complication: Secondary | ICD-10-CM | POA: Diagnosis not present

## 2019-03-27 DIAGNOSIS — E7849 Other hyperlipidemia: Secondary | ICD-10-CM | POA: Diagnosis not present

## 2019-03-27 DIAGNOSIS — M810 Age-related osteoporosis without current pathological fracture: Secondary | ICD-10-CM | POA: Diagnosis not present

## 2019-03-31 DIAGNOSIS — I10 Essential (primary) hypertension: Secondary | ICD-10-CM | POA: Diagnosis not present

## 2019-03-31 DIAGNOSIS — R82998 Other abnormal findings in urine: Secondary | ICD-10-CM | POA: Diagnosis not present

## 2019-04-02 DIAGNOSIS — Z0000 Encounter for general adult medical examination without abnormal findings: Secondary | ICD-10-CM | POA: Diagnosis not present

## 2019-04-02 DIAGNOSIS — I129 Hypertensive chronic kidney disease with stage 1 through stage 4 chronic kidney disease, or unspecified chronic kidney disease: Secondary | ICD-10-CM | POA: Diagnosis not present

## 2019-04-02 DIAGNOSIS — E785 Hyperlipidemia, unspecified: Secondary | ICD-10-CM | POA: Diagnosis not present

## 2019-04-02 DIAGNOSIS — R000 Tachycardia, unspecified: Secondary | ICD-10-CM | POA: Diagnosis not present

## 2019-04-02 DIAGNOSIS — K862 Cyst of pancreas: Secondary | ICD-10-CM | POA: Diagnosis not present

## 2019-04-02 DIAGNOSIS — K449 Diaphragmatic hernia without obstruction or gangrene: Secondary | ICD-10-CM | POA: Diagnosis not present

## 2019-04-02 DIAGNOSIS — D72829 Elevated white blood cell count, unspecified: Secondary | ICD-10-CM | POA: Diagnosis not present

## 2019-04-02 DIAGNOSIS — N1831 Chronic kidney disease, stage 3a: Secondary | ICD-10-CM | POA: Diagnosis not present

## 2019-04-02 DIAGNOSIS — I700 Atherosclerosis of aorta: Secondary | ICD-10-CM | POA: Diagnosis not present

## 2019-04-02 DIAGNOSIS — E1169 Type 2 diabetes mellitus with other specified complication: Secondary | ICD-10-CM | POA: Diagnosis not present

## 2019-04-02 DIAGNOSIS — C50919 Malignant neoplasm of unspecified site of unspecified female breast: Secondary | ICD-10-CM | POA: Diagnosis not present

## 2019-04-02 DIAGNOSIS — J479 Bronchiectasis, uncomplicated: Secondary | ICD-10-CM | POA: Diagnosis not present

## 2019-04-02 DIAGNOSIS — Z1331 Encounter for screening for depression: Secondary | ICD-10-CM | POA: Diagnosis not present

## 2019-06-18 ENCOUNTER — Ambulatory Visit: Payer: Medicare Other | Attending: Internal Medicine

## 2019-06-18 ENCOUNTER — Other Ambulatory Visit: Payer: Self-pay

## 2019-06-18 DIAGNOSIS — Z23 Encounter for immunization: Secondary | ICD-10-CM | POA: Insufficient documentation

## 2019-06-18 NOTE — Progress Notes (Signed)
   Covid-19 Vaccination Clinic  Name:  [name]    MRN: [id number] DOB: Nov 29, 1928  06/18/2019  [name] was observed post Covid-19 immunization for 15 minutes without incidence. She was provided with Vaccine Information Sheet and instruction to access the V-Safe system.   [name] was instructed to call 911 with any severe reactions post vaccine: Marland Kitchen Difficulty breathing  . Swelling of your face and throat  . A fast heartbeat  . A bad rash all over your body  . Dizziness and weakness    Immunizations Administered    Name Date Dose VIS Date Route   Pfizer COVID-19 Vaccine 06/18/2019 12:49 PM 0.3 mL 05/09/2019 Intramuscular   Manufacturer: [place]   Lot: [id number]   [place]: [id number]

## 2019-07-09 ENCOUNTER — Ambulatory Visit: Payer: Medicare Other | Attending: Internal Medicine

## 2019-07-09 DIAGNOSIS — Z23 Encounter for immunization: Secondary | ICD-10-CM | POA: Insufficient documentation

## 2019-07-09 NOTE — Progress Notes (Signed)
   Covid-19 Vaccination Clinic  Name:  [name]    MRN: [id number] DOB: 07-05-1928  07/09/2019  [name] was observed post Covid-19 immunization for 15 minutes without incidence. She was provided with Vaccine Information Sheet and instruction to access the V-Safe system.   [name] was instructed to call 911 with any severe reactions post vaccine: Marland Kitchen Difficulty breathing  . Swelling of your face and throat  . A fast heartbeat  . A bad rash all over your body  . Dizziness and weakness    Immunizations Administered    Name Date Dose VIS Date Route   Pfizer COVID-19 Vaccine 07/09/2019 11:50 AM 0.3 mL 05/09/2019 Intramuscular   Manufacturer: [place]   Lot: [id number]   [place]: [id number]

## 2019-09-10 DIAGNOSIS — H52202 Unspecified astigmatism, left eye: Secondary | ICD-10-CM | POA: Diagnosis not present

## 2019-09-10 DIAGNOSIS — H353132 Nonexudative age-related macular degeneration, bilateral, intermediate dry stage: Secondary | ICD-10-CM | POA: Diagnosis not present

## 2019-09-10 DIAGNOSIS — H26493 Other secondary cataract, bilateral: Secondary | ICD-10-CM | POA: Diagnosis not present

## 2019-09-10 DIAGNOSIS — H04123 Dry eye syndrome of bilateral lacrimal glands: Secondary | ICD-10-CM | POA: Diagnosis not present

## 2019-10-06 DIAGNOSIS — J479 Bronchiectasis, uncomplicated: Secondary | ICD-10-CM | POA: Diagnosis not present

## 2019-10-06 DIAGNOSIS — K219 Gastro-esophageal reflux disease without esophagitis: Secondary | ICD-10-CM | POA: Diagnosis not present

## 2019-10-06 DIAGNOSIS — N1831 Chronic kidney disease, stage 3a: Secondary | ICD-10-CM | POA: Diagnosis not present

## 2019-10-06 DIAGNOSIS — I10 Essential (primary) hypertension: Secondary | ICD-10-CM | POA: Diagnosis not present

## 2019-10-06 DIAGNOSIS — I129 Hypertensive chronic kidney disease with stage 1 through stage 4 chronic kidney disease, or unspecified chronic kidney disease: Secondary | ICD-10-CM | POA: Diagnosis not present

## 2019-10-06 DIAGNOSIS — M47896 Other spondylosis, lumbar region: Secondary | ICD-10-CM | POA: Diagnosis not present

## 2019-10-06 DIAGNOSIS — I700 Atherosclerosis of aorta: Secondary | ICD-10-CM | POA: Diagnosis not present

## 2019-10-06 DIAGNOSIS — E1169 Type 2 diabetes mellitus with other specified complication: Secondary | ICD-10-CM | POA: Diagnosis not present

## 2019-10-06 DIAGNOSIS — K862 Cyst of pancreas: Secondary | ICD-10-CM | POA: Diagnosis not present

## 2019-10-25 IMAGING — CT CT ABD-PELV W/ CM
2 of 5 series · 15 of 46 positions shown, 17 images · IV contrast (Omni 300)
Comparison: 12/19/2017 CT.

CLINICAL DATA: 89-year-old female with diverticulitis. Status post
antibiotics. Subsequent encounter.

EXAM:
CT ABDOMEN AND PELVIS WITH CONTRAST
TECHNIQUE: Multidetector CT imaging of the abdomen and pelvis was performed
using the standard protocol following bolus administration of
intravenous contrast.
CONTRAST:  75mL OMNIPAQUE IOHEXOL 300 MG/ML  SOLN

[Series 3: a/p w/ 5mm · axial · 0.73mm/px · z∈[-416,-71]mm · 12 of 77 slices shown, 14 images]
[im 4/77  soft-tissue]
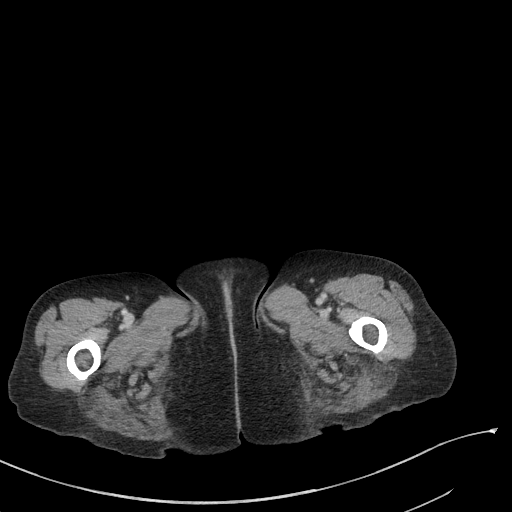
[im 4/77  bone]
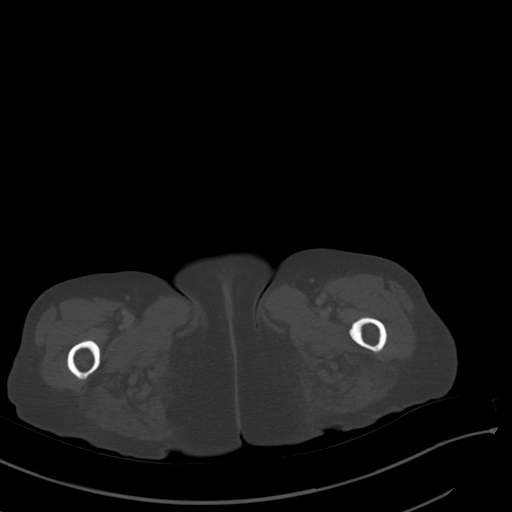
[im 12/77  soft-tissue]
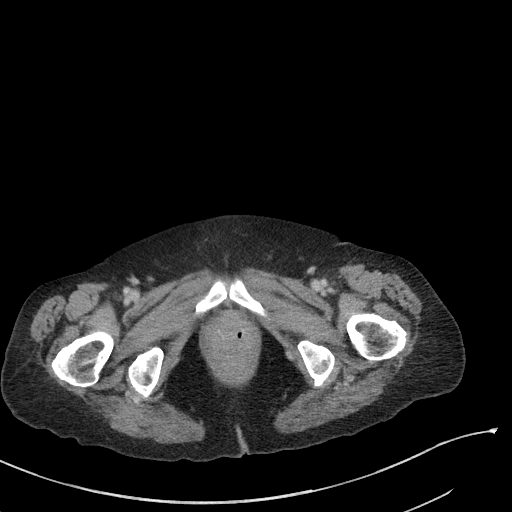
[im 16/77  soft-tissue]
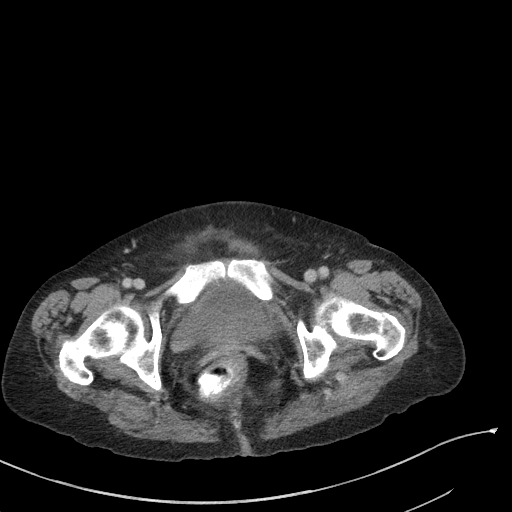
[im 23/77  soft-tissue]
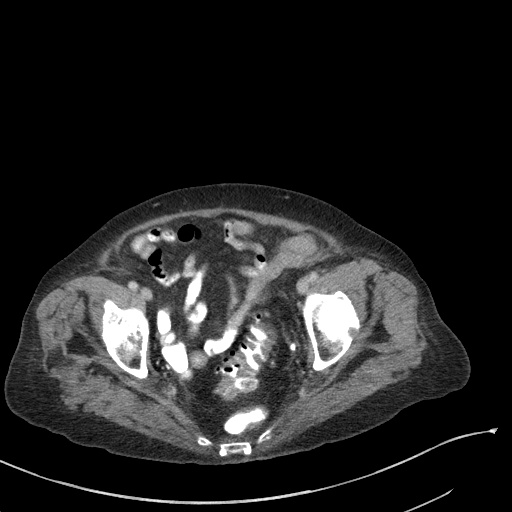
[im 31/77  soft-tissue]
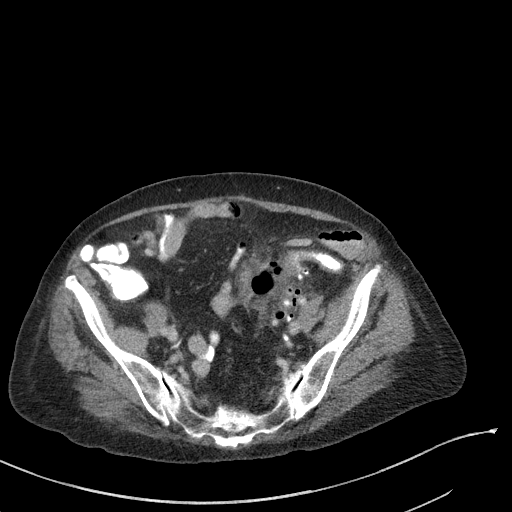
[im 35/77  soft-tissue]
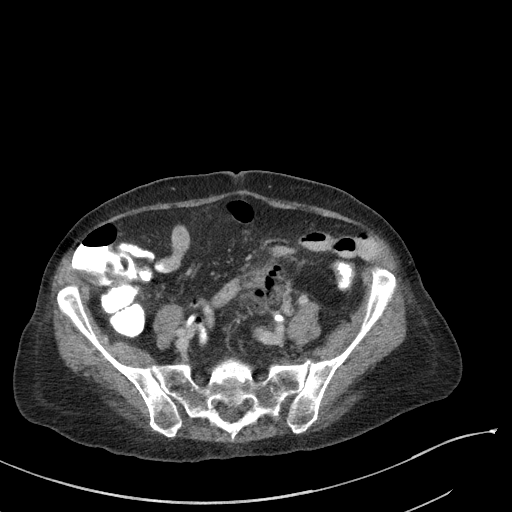
[im 42/77  soft-tissue]
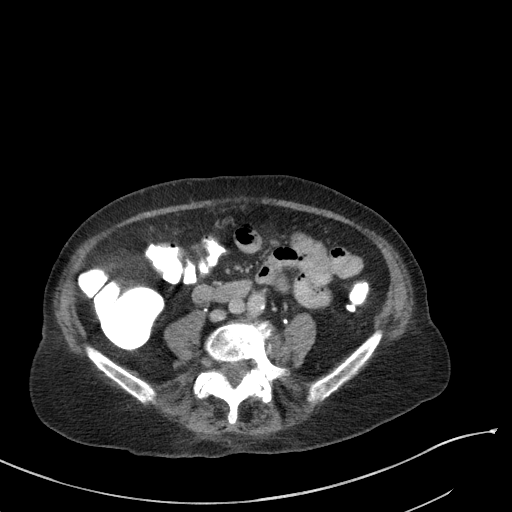
[im 46/77  soft-tissue]
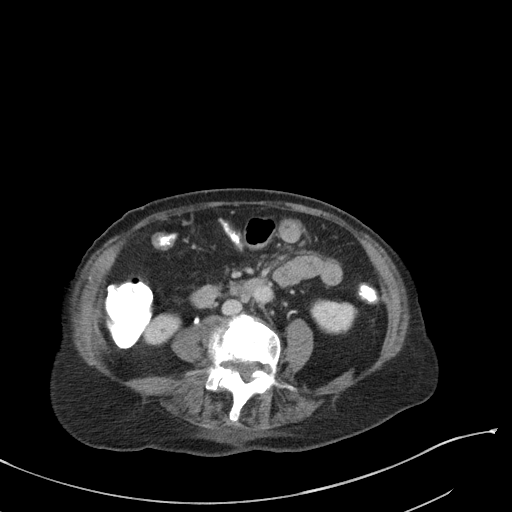
[im 54/77  soft-tissue]
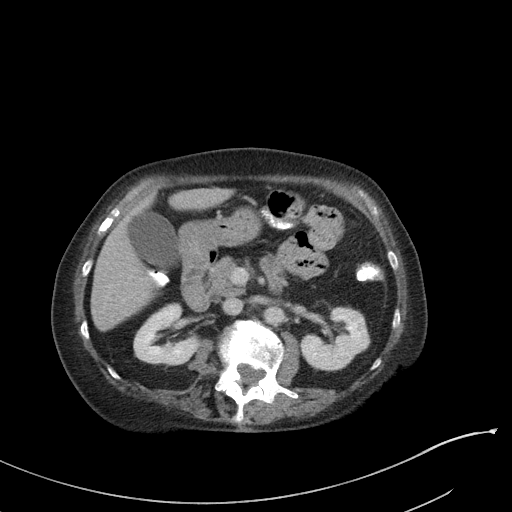
[im 54/77  bone]
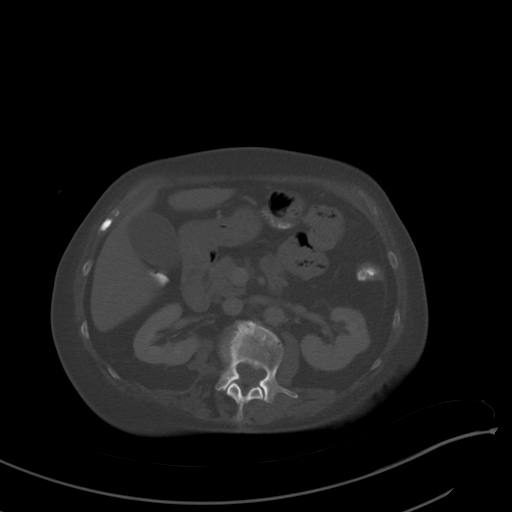
[im 61/77  soft-tissue]
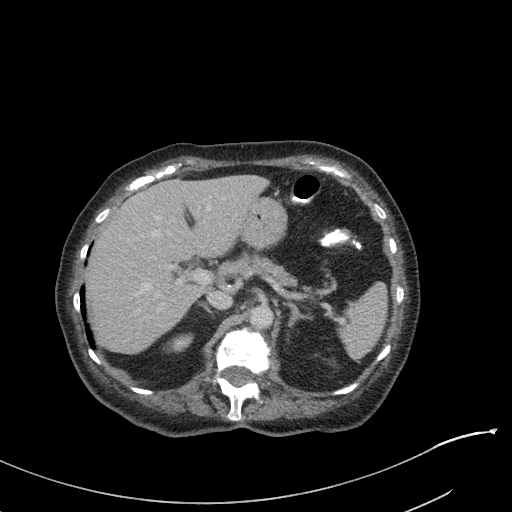
[im 65/77  soft-tissue]
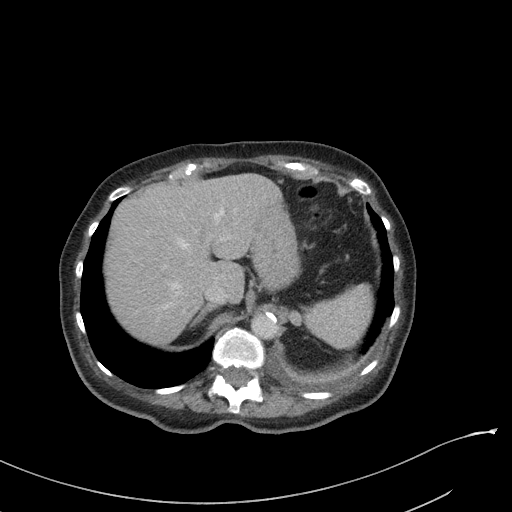
[im 73/77  soft-tissue]
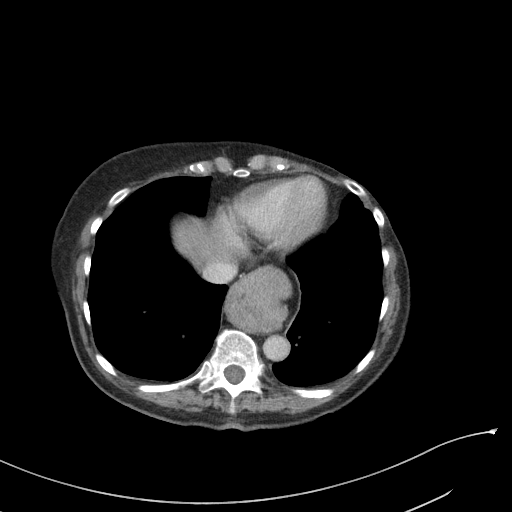

[Series 6: a/p w/ cor · coronal · 0.59mm/px · 3 of 114 slices shown]
[im 38/114  soft-tissue]
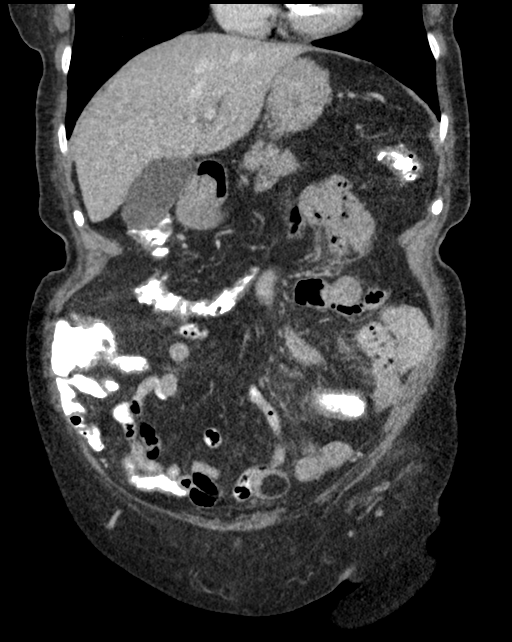
[im 51/114  soft-tissue]
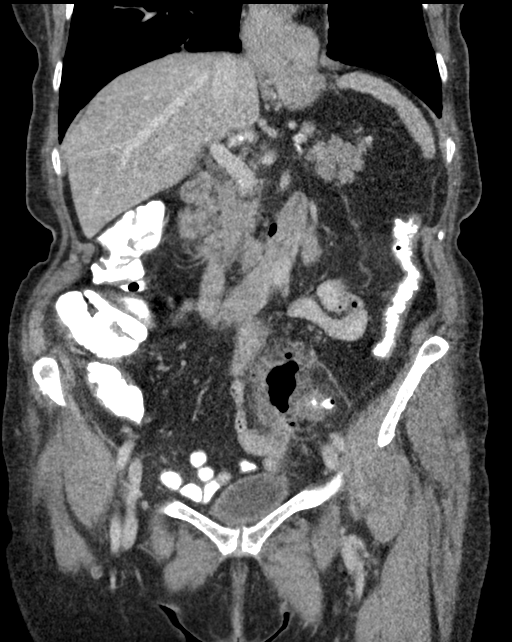
[im 63/114  soft-tissue]
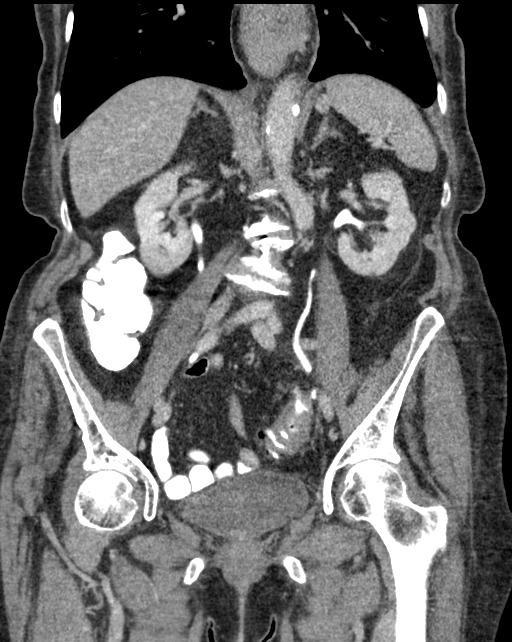

[15 of 46 positions shown; findings below may reference images not displayed]

FINDINGS: Lower chest: Prominent mitral valve calcification. Right coronary
artery calcification. Heart size within normal limits. Breast
prosthesis in place. No worrisome lung base abnormality.

Hepatobiliary: No worrisome hepatic lesion. Prominent size
gallbladder without calcified gallstone. No surrounding
inflammation. No common bile duct stone.

Pancreas: Scattered tiny pancreatic calcifications. Questionable
tiny cystic lesion with follow-up as previously noted.

Spleen: No splenic mass or enlargement.

Adrenals/Urinary Tract: No obstructing stone or hydronephrosis. No
worrisome renal or adrenal lesion. Noncontrast filled imaging
urinary bladder without gross abnormality.

Stomach/Bowel: Abscess associated with sigmoid diverticulitis has
decreased slightly in overall dimension since the prior examination
currently 3 x 3.7 x 4.2 cm versus prior 3.8 x 4.1 x 4.2 cm. There
has been a decrease in the amount of fluid contain within the
abscess and slight increase in amount of gas.

Prominent muscular hypertrophy/diverticula throughout the sigmoid
colon which remains inflamed. Less notable diverticula throughout
the descending colon.

2 cm lipoma within small bowel probably ilium.

Moderate-size hiatal hernia.

Vascular/Lymphatic: Atherosclerotic changes aorta and aortic branch
vessels. No abdominal aortic aneurysm or large vessel occlusion.

Small lymph nodes left pelvis probably reactive in origin.

Reproductive: Post hysterectomy.  No worrisome adnexal mass.

Other: No free intraperitoneal air separate from abscess.

Musculoskeletal: Scoliosis lumbar spine convex left with
superimposed prominent degenerative changes L1-2 through L5-S1
similar to prior exam.
IMPRESSION: 1. Abscess associated with sigmoid diverticulitis has decreased
slightly in overall dimension since the prior examination currently
3 x 3.7 x 4.2 cm versus prior 3.8 x 4.1 x 4.2 cm. There has been a
decrease in the amount of fluid contain within the abscess and
slight increase in amount of gas.
2. Remainder of findings without significant change as detailed
above.
3.  Aortic Atherosclerosis ([id number]-IX3.3).

## 2020-03-13 DIAGNOSIS — Z23 Encounter for immunization: Secondary | ICD-10-CM | POA: Diagnosis not present

## 2020-04-06 DIAGNOSIS — E1169 Type 2 diabetes mellitus with other specified complication: Secondary | ICD-10-CM | POA: Diagnosis not present

## 2020-04-06 DIAGNOSIS — E785 Hyperlipidemia, unspecified: Secondary | ICD-10-CM | POA: Diagnosis not present

## 2020-04-06 DIAGNOSIS — M810 Age-related osteoporosis without current pathological fracture: Secondary | ICD-10-CM | POA: Diagnosis not present

## 2020-04-13 DIAGNOSIS — E785 Hyperlipidemia, unspecified: Secondary | ICD-10-CM | POA: Diagnosis not present

## 2020-04-13 DIAGNOSIS — E1169 Type 2 diabetes mellitus with other specified complication: Secondary | ICD-10-CM | POA: Diagnosis not present

## 2020-04-13 DIAGNOSIS — M1990 Unspecified osteoarthritis, unspecified site: Secondary | ICD-10-CM | POA: Diagnosis not present

## 2020-04-13 DIAGNOSIS — K862 Cyst of pancreas: Secondary | ICD-10-CM | POA: Diagnosis not present

## 2020-04-13 DIAGNOSIS — Z0000 Encounter for general adult medical examination without abnormal findings: Secondary | ICD-10-CM | POA: Diagnosis not present

## 2020-04-13 DIAGNOSIS — I129 Hypertensive chronic kidney disease with stage 1 through stage 4 chronic kidney disease, or unspecified chronic kidney disease: Secondary | ICD-10-CM | POA: Diagnosis not present

## 2020-04-13 DIAGNOSIS — M810 Age-related osteoporosis without current pathological fracture: Secondary | ICD-10-CM | POA: Diagnosis not present

## 2020-04-13 DIAGNOSIS — R82998 Other abnormal findings in urine: Secondary | ICD-10-CM | POA: Diagnosis not present

## 2020-04-13 DIAGNOSIS — I700 Atherosclerosis of aorta: Secondary | ICD-10-CM | POA: Diagnosis not present

## 2020-04-13 DIAGNOSIS — K5792 Diverticulitis of intestine, part unspecified, without perforation or abscess without bleeding: Secondary | ICD-10-CM | POA: Diagnosis not present

## 2020-04-13 DIAGNOSIS — N1831 Chronic kidney disease, stage 3a: Secondary | ICD-10-CM | POA: Diagnosis not present

## 2020-04-13 DIAGNOSIS — Z1331 Encounter for screening for depression: Secondary | ICD-10-CM | POA: Diagnosis not present

## 2020-04-13 DIAGNOSIS — I10 Essential (primary) hypertension: Secondary | ICD-10-CM | POA: Diagnosis not present

## 2020-04-14 DIAGNOSIS — H353132 Nonexudative age-related macular degeneration, bilateral, intermediate dry stage: Secondary | ICD-10-CM | POA: Diagnosis not present

## 2020-04-14 DIAGNOSIS — H52202 Unspecified astigmatism, left eye: Secondary | ICD-10-CM | POA: Diagnosis not present

## 2020-04-14 DIAGNOSIS — H26493 Other secondary cataract, bilateral: Secondary | ICD-10-CM | POA: Diagnosis not present

## 2020-04-14 DIAGNOSIS — Z961 Presence of intraocular lens: Secondary | ICD-10-CM | POA: Diagnosis not present

## 2020-04-30 ENCOUNTER — Ambulatory Visit: Payer: Medicare Other | Attending: Internal Medicine

## 2020-04-30 DIAGNOSIS — Z23 Encounter for immunization: Secondary | ICD-10-CM

## 2020-04-30 NOTE — Progress Notes (Signed)
   Covid-19 Vaccination Clinic  Name:  [name]    MRN: [id number] DOB: 08/21/1928  04/30/2020  [name] was observed post Covid-19 immunization for 15 minutes without incident. She was provided with Vaccine Information Sheet and instruction to access the V-Safe system.   [name] was instructed to call 911 with any severe reactions post vaccine: Marland Kitchen Difficulty breathing  . Swelling of face and throat  . A fast heartbeat  . A bad rash all over body  . Dizziness and weakness   Immunizations Administered    Name Date Dose VIS Date Route   Pfizer COVID-19 Vaccine 04/30/2020  2:22 PM 0.3 mL 03/17/2020 Intramuscular   Manufacturer: [place]   Lot: [id number]   [place]: [address]-5

## 2020-06-22 ENCOUNTER — Other Ambulatory Visit: Payer: Self-pay | Admitting: Internal Medicine

## 2020-06-22 DIAGNOSIS — Z1231 Encounter for screening mammogram for malignant neoplasm of breast: Secondary | ICD-10-CM

## 2020-08-04 ENCOUNTER — Ambulatory Visit
Admission: RE | Admit: 2020-08-04 | Discharge: 2020-08-04 | Disposition: A | Discharge status: Home / Self Care | Payer: Medicare Other | Source: Ambulatory Visit | Attending: Internal Medicine | Admitting: Internal Medicine

## 2020-08-04 ENCOUNTER — Other Ambulatory Visit: Payer: Self-pay

## 2020-08-04 DIAGNOSIS — Z1231 Encounter for screening mammogram for malignant neoplasm of breast: Secondary | ICD-10-CM

## 2021-07-05 ENCOUNTER — Emergency Department (HOSPITAL_COMMUNITY): Payer: Medicare Other

## 2021-07-05 ENCOUNTER — Observation Stay (HOSPITAL_COMMUNITY): Payer: Medicare Other

## 2021-07-05 ENCOUNTER — Encounter (HOSPITAL_COMMUNITY): Payer: Self-pay

## 2021-07-05 ENCOUNTER — Other Ambulatory Visit: Payer: Self-pay

## 2021-07-05 ENCOUNTER — Inpatient Hospital Stay (HOSPITAL_COMMUNITY)
Admission: EM | Admit: 2021-07-05 | Discharge: 2021-07-12 | DRG: 097 | Disposition: A | Discharge status: Skilled Nursing Facility | Payer: Medicare Other | Attending: Family Medicine | Admitting: Family Medicine

## 2021-07-05 DIAGNOSIS — Z20822 Contact with and (suspected) exposure to covid-19: Secondary | ICD-10-CM | POA: Diagnosis present

## 2021-07-05 DIAGNOSIS — E1129 Type 2 diabetes mellitus with other diabetic kidney complication: Secondary | ICD-10-CM | POA: Diagnosis present

## 2021-07-05 DIAGNOSIS — G0402 Postimmunization acute disseminated encephalitis, myelitis and encephalomyelitis: Principal | ICD-10-CM | POA: Diagnosis present

## 2021-07-05 DIAGNOSIS — K219 Gastro-esophageal reflux disease without esophagitis: Secondary | ICD-10-CM | POA: Diagnosis present

## 2021-07-05 DIAGNOSIS — M4802 Spinal stenosis, cervical region: Secondary | ICD-10-CM | POA: Diagnosis present

## 2021-07-05 DIAGNOSIS — Z66 Do not resuscitate: Secondary | ICD-10-CM | POA: Diagnosis present

## 2021-07-05 DIAGNOSIS — F419 Anxiety disorder, unspecified: Secondary | ICD-10-CM | POA: Diagnosis present

## 2021-07-05 DIAGNOSIS — Z823 Family history of stroke: Secondary | ICD-10-CM

## 2021-07-05 DIAGNOSIS — R29898 Other symptoms and signs involving the musculoskeletal system: Secondary | ICD-10-CM | POA: Diagnosis not present

## 2021-07-05 DIAGNOSIS — Z23 Encounter for immunization: Secondary | ICD-10-CM

## 2021-07-05 DIAGNOSIS — Z8261 Family history of arthritis: Secondary | ICD-10-CM

## 2021-07-05 DIAGNOSIS — T50B95A Adverse effect of other viral vaccines, initial encounter: Secondary | ICD-10-CM | POA: Diagnosis present

## 2021-07-05 DIAGNOSIS — Z9012 Acquired absence of left breast and nipple: Secondary | ICD-10-CM

## 2021-07-05 DIAGNOSIS — G9511 Acute infarction of spinal cord (embolic) (nonembolic): Secondary | ICD-10-CM | POA: Diagnosis present

## 2021-07-05 DIAGNOSIS — R000 Tachycardia, unspecified: Secondary | ICD-10-CM | POA: Diagnosis present

## 2021-07-05 DIAGNOSIS — Z853 Personal history of malignant neoplasm of breast: Secondary | ICD-10-CM

## 2021-07-05 DIAGNOSIS — Z7982 Long term (current) use of aspirin: Secondary | ICD-10-CM

## 2021-07-05 DIAGNOSIS — E7800 Pure hypercholesterolemia, unspecified: Secondary | ICD-10-CM | POA: Diagnosis present

## 2021-07-05 DIAGNOSIS — Z90710 Acquired absence of both cervix and uterus: Secondary | ICD-10-CM

## 2021-07-05 DIAGNOSIS — Y929 Unspecified place or not applicable: Secondary | ICD-10-CM

## 2021-07-05 DIAGNOSIS — M47816 Spondylosis without myelopathy or radiculopathy, lumbar region: Secondary | ICD-10-CM | POA: Diagnosis present

## 2021-07-05 DIAGNOSIS — Z79899 Other long term (current) drug therapy: Secondary | ICD-10-CM

## 2021-07-05 DIAGNOSIS — M48061 Spinal stenosis, lumbar region without neurogenic claudication: Secondary | ICD-10-CM | POA: Diagnosis present

## 2021-07-05 DIAGNOSIS — I10 Essential (primary) hypertension: Secondary | ICD-10-CM | POA: Diagnosis present

## 2021-07-05 DIAGNOSIS — R252 Cramp and spasm: Secondary | ICD-10-CM | POA: Diagnosis present

## 2021-07-05 DIAGNOSIS — M810 Age-related osteoporosis without current pathological fracture: Secondary | ICD-10-CM | POA: Diagnosis present

## 2021-07-05 DIAGNOSIS — M419 Scoliosis, unspecified: Secondary | ICD-10-CM | POA: Diagnosis present

## 2021-07-05 DIAGNOSIS — I5189 Other ill-defined heart diseases: Secondary | ICD-10-CM

## 2021-07-05 DIAGNOSIS — R531 Weakness: Principal | ICD-10-CM | POA: Diagnosis present

## 2021-07-05 DIAGNOSIS — Z8249 Family history of ischemic heart disease and other diseases of the circulatory system: Secondary | ICD-10-CM

## 2021-07-05 LAB — COMPREHENSIVE METABOLIC PANEL
ALT: 19 U/L (ref 0–44)
AST: 20 U/L (ref 15–41)
Albumin: 4 g/dL (ref 3.5–5.0)
Alkaline Phosphatase: 46 U/L (ref 38–126)
Anion gap: 8 (ref 5–15)
BUN: 18 mg/dL (ref 8–23)
CO2: 25 mmol/L (ref 22–32)
Calcium: 9.2 mg/dL (ref 8.9–10.3)
Chloride: 103 mmol/L (ref 98–111)
Creatinine, Ser: 0.78 mg/dL (ref 0.44–1.00)
GFR, Estimated: >60 mL/min (ref >60)
Glucose, Bld: 202 mg/dL — ABNORMAL HIGH (ref 70–99)
Potassium: 4.1 mmol/L (ref 3.5–5.1)
Sodium: 136 mmol/L (ref 135–145)
Total Bilirubin: 0.1 mg/dL — ABNORMAL LOW (ref 0.3–1.2)
Total Protein: 7.1 g/dL (ref 6.5–8.1)

## 2021-07-05 LAB — CREATININE, SERUM
Creatinine, Ser: 0.69 mg/dL (ref 0.44–1.00)
GFR, Estimated: >60 mL/min (ref >60)

## 2021-07-05 LAB — CBC
HCT: 39.6 % (ref 36.0–46.0)
HCT: 38 % (ref 36.0–46.0)
Hemoglobin: 13 g/dL (ref 12.0–15.0)
Hemoglobin: 12.4 g/dL (ref 12.0–15.0)
MCH: 29.7 pg (ref 26.0–34.0)
MCH: 29.5 pg (ref 26.0–34.0)
MCHC: 32.8 g/dL (ref 30.0–36.0)
MCHC: 32.6 g/dL (ref 30.0–36.0)
MCV: 90.6 fL (ref 80.0–100.0)
MCV: 90.5 fL (ref 80.0–100.0)
Platelets: 228 10*3/uL (ref 150–400)
Platelets: 235 10*3/uL (ref 150–400)
RBC: 4.37 MIL/uL (ref 3.87–5.11)
RBC: 4.2 MIL/uL (ref 3.87–5.11)
RDW: 13.2 % (ref 11.5–15.5)
RDW: 13.2 % (ref 11.5–15.5)
WBC: 11.9 10*3/uL — ABNORMAL HIGH (ref 4.0–10.5)
WBC: 11 10*3/uL — ABNORMAL HIGH (ref 4.0–10.5)
nRBC: 0 % (ref 0.0–0.2)
nRBC: 0 % (ref 0.0–0.2)

## 2021-07-05 LAB — URINALYSIS, ROUTINE W REFLEX MICROSCOPIC
Bilirubin Urine: NEGATIVE
Glucose, UA: 150 mg/dL — AB
Hgb urine dipstick: NEGATIVE
Ketones, ur: NEGATIVE mg/dL
Leukocytes,Ua: NEGATIVE
Nitrite: NEGATIVE
Protein, ur: NEGATIVE mg/dL
Specific Gravity, Urine: 1.013 (ref 1.005–1.030)
pH: 7 (ref 5.0–8.0)

## 2021-07-05 LAB — LIPASE, BLOOD: Lipase: 57 U/L — ABNORMAL HIGH (ref 11–51)

## 2021-07-05 LAB — RESP PANEL BY RT-PCR (FLU A&B, COVID) ARPGX2
Influenza A by PCR: NEGATIVE
Influenza B by PCR: NEGATIVE
SARS Coronavirus 2 by RT PCR: NEGATIVE

## 2021-07-05 LAB — TROPONIN I (HIGH SENSITIVITY)
Troponin I (High Sensitivity): 3 ng/L (ref <18)
Troponin I (High Sensitivity): 4 ng/L

## 2021-07-05 MED ORDER — ONDANSETRON HCL 4 MG PO TABS: 4.0000 mg | ORAL_TABLET | Freq: Four times a day (QID) | ORAL | Status: DC | PRN

## 2021-07-05 MED ORDER — ONDANSETRON HCL 4 MG/2ML IJ SOLN: 4.0000 mg | Freq: Four times a day (QID) | INTRAMUSCULAR | Status: DC | PRN

## 2021-07-05 MED ORDER — AMLODIPINE BESYLATE 5 MG PO TABS
2.5000 mg | ORAL_TABLET | Freq: Every day | ORAL | Status: DC
  Administered 2021-07-06 – 2021-07-12 (×7): 2.5 mg via ORAL
  Filled 2021-07-05 (×7): qty 1

## 2021-07-05 MED ORDER — DOCUSATE SODIUM 100 MG PO CAPS
100.0000 mg | ORAL_CAPSULE | Freq: Two times a day (BID) | ORAL | Status: DC
  Administered 2021-07-05 – 2021-07-12 (×14): 100 mg via ORAL
  Filled 2021-07-05 (×14): qty 1

## 2021-07-05 MED ORDER — ACETAMINOPHEN 650 MG RE SUPP: 650.0000 mg | Freq: Four times a day (QID) | RECTAL | Status: DC | PRN

## 2021-07-05 MED ORDER — EZETIMIBE 10 MG PO TABS
10.0000 mg | ORAL_TABLET | Freq: Every day | ORAL | Status: DC
  Administered 2021-07-06 – 2021-07-12 (×7): 10 mg via ORAL
  Filled 2021-07-05 (×7): qty 1

## 2021-07-05 MED ORDER — ASPIRIN EC 325 MG PO TBEC
325.0000 mg | DELAYED_RELEASE_TABLET | Freq: Every day | ORAL | Status: DC
  Administered 2021-07-06 – 2021-07-12 (×7): 325 mg via ORAL
  Filled 2021-07-05 (×7): qty 1

## 2021-07-05 MED ORDER — PANTOPRAZOLE SODIUM 40 MG PO TBEC
40.0000 mg | DELAYED_RELEASE_TABLET | Freq: Every day | ORAL | Status: DC
  Administered 2021-07-06 – 2021-07-12 (×7): 40 mg via ORAL
  Filled 2021-07-05 (×7): qty 1

## 2021-07-05 MED ORDER — ALPRAZOLAM 0.25 MG PO TABS
0.2500 mg | ORAL_TABLET | Freq: Every evening | ORAL | Status: DC | PRN
  Administered 2021-07-05 – 2021-07-11 (×7): 0.25 mg via ORAL
  Filled 2021-07-05 (×7): qty 1

## 2021-07-05 MED ORDER — METOPROLOL SUCCINATE ER 25 MG PO TB24
12.5000 mg | ORAL_TABLET | Freq: Every day | ORAL | Status: DC
  Administered 2021-07-06 – 2021-07-12 (×7): 12.5 mg via ORAL
  Filled 2021-07-05 (×7): qty 1

## 2021-07-05 MED ORDER — DIPHENOXYLATE-ATROPINE 2.5-0.025 MG PO TABS: 1.0000 | ORAL_TABLET | Freq: Four times a day (QID) | ORAL | Status: DC | PRN

## 2021-07-05 MED ORDER — ENOXAPARIN SODIUM 30 MG/0.3ML IJ SOSY
30.0000 mg | PREFILLED_SYRINGE | INTRAMUSCULAR | Status: DC
  Administered 2021-07-05 – 2021-07-07 (×3): 30 mg via SUBCUTANEOUS
  Filled 2021-07-05 (×3): qty 0.3

## 2021-07-05 MED ORDER — ACETAMINOPHEN 325 MG PO TABS
650.0000 mg | ORAL_TABLET | Freq: Four times a day (QID) | ORAL | Status: DC | PRN
  Administered 2021-07-11: 650 mg via ORAL
  Filled 2021-07-05: qty 2

## 2021-07-05 NOTE — ED Provider Notes (Signed)
Patient initially seen by Dr. Ashok Cordia.  Please see his note.  Plan was to follow-up on delta troponin and if negative possible discharge.  Discussed the findings with the patient however she states she is having persistent difficulty ambulating.  Staff tried to help the patient up to walk around the ED and she was unable to do so.  Patient feels like her left leg is weaker although it is also somewhat cramping.  Somewhat atypical for acute stroke but will order MRI.  I will consult medical service for admission and further treatment.   Dorie Rank, MD 07/05/21 [phone]

## 2021-07-05 NOTE — ED Notes (Signed)
Transported to MRI

## 2021-07-05 NOTE — ED Triage Notes (Signed)
Pt BIBA from home for c/o weakness, started yesterday then went away. This morning felt weak again, like she was going to pass out so called EMS. Unable to stand on own. Denies pain, any other complaints. 18g RAC, LUE restricted. A&Ox4  BP 158/76 HR 100 SpO2 100% RA CBG 274, no hx DM

## 2021-07-05 NOTE — ED Notes (Signed)
Pt given sandwich, water per request

## 2021-07-05 NOTE — ED Notes (Signed)
Pt returns from MRI, then transported to XR

## 2021-07-05 NOTE — ED Notes (Signed)
Pt assisted to bedside cammode

## 2021-07-05 NOTE — ED Provider Notes (Signed)
[facility] DEPT Provider Note   CSN: [id number] Arrival date & time: 07/05/21  1035     History  Chief Complaint  Patient presents with   Weakness    [name] is a 88 y.o. female.  Patient c/o generalized weakness, feeling as if her legs would give way in the past day. Symptoms acute onset, moderate, persistent. Denies trauma or fall, but states felt like she had to sit back down. No lightheadedness or syncope. No unilateral numbness or weakness. No change in speech or vision. No headache. No neck/back pain. No chest pain or discomfort. No sob or unusual doe. No palpitations. No abd pain or nvd. No melena or rectal bleeding. No dysuria or gu c/o. No swelling. No skin lesions. States did have covid booster 3 days ago, no other recent change in meds or new meds. No fever or chills.   The history is provided by the patient, medical records and the EMS personnel.      Home Medications Prior to Admission medications   Medication Sig Start Date End Date Taking? Authorizing Provider  ALPRAZolam Duanne Moron) 0.25 MG tablet Take 0.25 mg by mouth at bedtime as needed for anxiety or sleep.     [provider]  amLODipine (NORVASC) 5 MG tablet Take 0.5 tablets (2.5 mg total) by mouth daily. 12/06/18   Johnson, Clanford L, MD  Ascorbic Acid (VITAMIN C) 500 MG tablet Take 500 mg by mouth daily.      [provider]  aspirin 325 MG EC tablet Take 325 mg by mouth daily.    [provider]  Cholecalciferol (VITAMIN D) 1000 UNITS capsule Take 1,000 Units by mouth daily.      [provider]  Coenzyme Q10 (CO Q-10) 200 MG CAPS Take 1 capsule by mouth daily.     [provider]  diphenoxylate-atropine (LOMOTIL) 2.5-0.025 MG tablet Take 1 tablet by mouth 4 (four) times daily as needed for diarrhea or loose stools. 12/06/18   Johnson, Clanford L, MD  ezetimibe (ZETIA) 10 MG tablet Take 10 mg by mouth daily. 02/26/16   [provider]  metoprolol succinate (TOPROL-XL) 25 MG 24 hr tablet TAKE 1/2 TABLET BY MOUTH DAILY Patient taking differently: Take 12.5 mg by mouth daily.  10/01/17   Nahser, Wonda Cheng, MD  Multiple Vitamin (MULTIVITAMIN) tablet Take 1 tablet by mouth daily.      [provider]  omeprazole (PRILOSEC) 20 MG capsule Take 1 capsule (20 mg total) by mouth daily. 05/27/15   Darlin Coco, MD  raloxifene (EVISTA) 60 MG tablet TAKE 1 TABLET BY MOUTH ONCE DAILY Patient taking differently: Take 60 mg by mouth daily.  12/23/14   Darlin Coco, MD  saccharomyces boulardii (FLORASTOR) 250 MG capsule Take 1 capsule (250 mg total) by mouth 2 (two) times daily. 12/06/18   Johnson, Clanford L, MD  vitamin B-12 (CYANOCOBALAMIN) 500 MCG tablet Take 500 mcg by mouth daily.     [provider]      Allergies    Patient has no known allergies.    Review of Systems   Review of Systems  Constitutional:  Negative for chills and fever.  HENT:  Negative for sore throat.   Eyes:  Negative for redness and visual disturbance.  Respiratory:  Negative for cough and shortness of breath.   Cardiovascular:  Negative for chest pain, palpitations and leg swelling.  Gastrointestinal:  Negative for abdominal pain, blood in stool, diarrhea and vomiting.  Genitourinary:  Negative for dysuria and flank pain.  Musculoskeletal:  Negative for back pain and neck pain.  Skin:  Negative for rash.  Neurological:  Negative for speech difficulty, numbness and headaches.  Hematological:  Does not bruise/bleed easily.  Psychiatric/Behavioral:  Negative for confusion.    Physical Exam Updated Vital Signs BP (!) 141/76 (BP Location: Right Arm)    Pulse 96    Temp 98 F (36.7 C) (Oral)    Resp 19    SpO2 98%  Physical Exam Vitals and nursing note reviewed.  Constitutional:      Appearance: Normal appearance. She is well-developed.  HENT:     Head: Atraumatic.     Nose: Nose normal.     Mouth/Throat:      Mouth: Mucous membranes are moist.  Eyes:     General: No scleral icterus.    Conjunctiva/sclera: Conjunctivae normal.     Pupils: Pupils are equal, round, and reactive to light.  Neck:     Vascular: No carotid bruit.     Trachea: No tracheal deviation.     Comments: No stiffness or rigidity Cardiovascular:     Rate and Rhythm: Normal rate and regular rhythm.     Pulses: Normal pulses.     Heart sounds: Normal heart sounds. No murmur heard.   No friction rub. No gallop.  Pulmonary:     Effort: Pulmonary effort is normal. No respiratory distress.     Breath sounds: Normal breath sounds.  Abdominal:     General: Bowel sounds are normal. There is no distension.     Palpations: Abdomen is soft. There is no mass.     Tenderness: There is no abdominal tenderness. There is no guarding or rebound.     Hernia: No hernia is present.  Genitourinary:    Comments: No cva tenderness.  Musculoskeletal:        General: No swelling or tenderness.     Cervical back: Normal range of motion and neck supple. No rigidity. No muscular tenderness.     Right lower leg: No edema.     Left lower leg: No edema.  Skin:    General: Skin is warm and dry.     Findings: No rash.  Neurological:     Mental Status: She is alert.     Comments: Alert, speech normal. Motor/sens grossly intact bil.   Psychiatric:        Mood and Affect: Mood normal.    ED Results / Procedures / Treatments   Labs (all labs ordered are listed, but only abnormal results are displayed) Results for orders placed or performed during the hospital encounter of 07/05/21  Resp Panel by RT-PCR (Flu A&B, Covid) Nasopharyngeal Swab   Specimen: Nasopharyngeal Swab; Nasopharyngeal(NP) swabs in vial transport medium  Result Value Ref Range   SARS Coronavirus 2 by RT PCR NEGATIVE NEGATIVE   Influenza A by PCR NEGATIVE NEGATIVE   Influenza B by PCR NEGATIVE NEGATIVE  CBC  Result Value Ref Range   WBC 11.9 (H) 4.0 - 10.5 K/uL   RBC 4.37  3.87 - 5.11 MIL/uL   Hemoglobin 13.0 12.0 - 15.0 g/dL   HCT 39.6 36.0 - 46.0 %   MCV 90.6 80.0 - 100.0 fL   MCH 29.7 26.0 - 34.0 pg   MCHC 32.8 30.0 - 36.0 g/dL   RDW 13.2 11.5 - 15.5 %   Platelets 228 150 - 400 K/uL   nRBC 0.0 0.0 - 0.2 %  Comprehensive  metabolic panel  Result Value Ref Range   Sodium 136 135 - 145 mmol/L   Potassium 4.1 3.5 - 5.1 mmol/L   Chloride 103 98 - 111 mmol/L   CO2 25 22 - 32 mmol/L   Glucose, Bld 202 (H) 70 - 99 mg/dL   BUN 18 8 - 23 mg/dL   Creatinine, Ser 0.78 0.44 - 1.00 mg/dL   Calcium 9.2 8.9 - 10.3 mg/dL   Total Protein 7.1 6.5 - 8.1 g/dL   Albumin 4.0 3.5 - 5.0 g/dL   AST 20 15 - 41 U/L   ALT 19 0 - 44 U/L   Alkaline Phosphatase 46 38 - 126 U/L   Total Bilirubin 0.1 (L) 0.3 - 1.2 mg/dL   GFR, Estimated >60 >60 mL/min   Anion gap 8 5 - 15  Urinalysis, Routine w reflex microscopic  Result Value Ref Range   Color, Urine YELLOW YELLOW   APPearance CLEAR CLEAR   Specific Gravity, Urine 1.013 1.005 - 1.030   pH 7.0 5.0 - 8.0   Glucose, UA 150 (A) NEGATIVE mg/dL   Hgb urine dipstick NEGATIVE NEGATIVE   Bilirubin Urine NEGATIVE NEGATIVE   Ketones, ur NEGATIVE NEGATIVE mg/dL   Protein, ur NEGATIVE NEGATIVE mg/dL   Nitrite NEGATIVE NEGATIVE   Leukocytes,Ua NEGATIVE NEGATIVE  Lipase, blood  Result Value Ref Range   Lipase 57 (H) 11 - 51 U/L  Troponin I (High Sensitivity)  Result Value Ref Range   Troponin I (High Sensitivity) 3 <18 ng/L   CT Head Wo Contrast  Result Date: 07/05/2021 CLINICAL DATA:  Mental status change EXAM: CT HEAD WITHOUT CONTRAST TECHNIQUE: Contiguous axial images were obtained from the base of the skull through the vertex without intravenous contrast. RADIATION DOSE REDUCTION: This exam was performed according to the departmental dose-optimization program which includes automated exposure control, adjustment of the mA and/or kV according to patient size and/or use of iterative reconstruction technique. COMPARISON:  None.  FINDINGS: Brain: No evidence of acute infarction, hemorrhage, hydrocephalus, extra-axial collection or mass lesion/mass effect. Vascular: No hyperdense vessel or unexpected calcification. Skull: Normal. Negative for fracture or focal lesion. Sinuses/Orbits: No acute finding. Other: None. IMPRESSION: No acute intracranial abnormality. Electronically Signed   By: Yetta Glassman M.D.   On: 07/05/2021 12:45   DG Chest Port 1 View  Result Date: 07/05/2021 CLINICAL DATA:  Weakness EXAM: PORTABLE CHEST 1 VIEW COMPARISON:  Chest radiograph dated December 02, 2018 FINDINGS: The heart size and mediastinal contours are within normal limits. Atherosclerotic calcification of the aortic arch. Moderate hiatal hernia. Both lungs are clear. Left axillary surgical staples, unchanged. No acute osseous abnormality. IMPRESSION: No acute cardiopulmonary process. Electronically Signed   By: Keane Police D.O.   On: 07/05/2021 13:17     EKG EKG Interpretation  Date/Time:  Tuesday July 05 2021 12:29:21 EST Ventricular Rate:  102 PR Interval:  181 QRS Duration: 82 QT Interval:  379 QTC Calculation: 494 R Axis:   55 Text Interpretation: Sinus tachycardia Borderline prolonged QT interval Baseline wander in lead(s) V2 Confirmed by Lajean Saver [phone]) on 07/05/2021 12:37:03 PM  Radiology CT Head Wo Contrast  Result Date: 07/05/2021 CLINICAL DATA:  Mental status change EXAM: CT HEAD WITHOUT CONTRAST TECHNIQUE: Contiguous axial images were obtained from the base of the skull through the vertex without intravenous contrast. RADIATION DOSE REDUCTION: This exam was performed according to the departmental dose-optimization program which includes automated exposure control, adjustment of the mA and/or kV according to patient size  and/or use of iterative reconstruction technique. COMPARISON:  None. FINDINGS: Brain: No evidence of acute infarction, hemorrhage, hydrocephalus, extra-axial collection or mass lesion/mass effect. Vascular:  No hyperdense vessel or unexpected calcification. Skull: Normal. Negative for fracture or focal lesion. Sinuses/Orbits: No acute finding. Other: None. IMPRESSION: No acute intracranial abnormality. Electronically Signed   By: Yetta Glassman M.D.   On: 07/05/2021 12:45   DG Chest Port 1 View  Result Date: 07/05/2021 CLINICAL DATA:  Weakness EXAM: PORTABLE CHEST 1 VIEW COMPARISON:  Chest radiograph dated December 02, 2018 FINDINGS: The heart size and mediastinal contours are within normal limits. Atherosclerotic calcification of the aortic arch. Moderate hiatal hernia. Both lungs are clear. Left axillary surgical staples, unchanged. No acute osseous abnormality. IMPRESSION: No acute cardiopulmonary process. Electronically Signed   By: Keane Police D.O.   On: 07/05/2021 13:17    Procedures Procedures    Medications Ordered in ED Medications - No data to display  ED Course/ Medical Decision Making/ A&P                           Medical Decision Making Amount and/or Complexity of Data Reviewed Labs: ordered. Radiology: ordered. ECG/medicine tests: ordered.  Iv ns. Continuous pulse ox and cardiac monitoring. Stat labs. Imaging. Ecg.  Reviewed nursing notes and prior charts for additional history.  External reports reviewed. Additional hx from EMS.  Labs reviewed/interpreted by me - trop normal.   CT reviewed/interpreted by me - no hem.   CXR reviewed/interpreted by me - no pna.   Additional labs reviewed/interpreted by me - pnd.  Recheck pt, pt denies any pain. No headache. No chest pain or discomfort. No sob. Pt denies abd pain, no nv. Chest cta. Abd soft non tender.   Po fluids/food. Ambulate in hall.   1535, delta trop pending - signed out to Dr Tomi Bamberger to check labs, and dispo appropriately.             Final Clinical Impression(s) / ED Diagnoses Final diagnoses:  None    Rx / DC Orders ED Discharge Orders     None         Lajean Saver, MD 07/05/21 [phone]

## 2021-07-05 NOTE — H&P (Signed)
History and Physical    [name] EXB:[id number] DOB: 1929-03-12 DOA: 07/05/2021  PCP: Crist Infante, MD   Patient coming from: Home  I have personally briefly reviewed patient's old medical records in [place]  Chief Complaint: Generalized weakness more pronounced in the left leg since morning.  HPI: [name] is a 88 y.o. female with PMH significant for history of breast cancer, hypercholesterolemia, hypertension, diabetes diet-controlled presented in the ED with complaints of generalized weakness since morning.  Patient reports she is fully functional, ambulatory,  lives alone and was in usual state of health yesterday.  Today after she woke up she felt generally weak, states weakness had been getting worse and is more pronounced in the left leg.  She denies any fall or trauma or injury to the leg.  She reports she has received COVID-vaccine few days back and thought it could be related to that.  She denies any fever, chills, sick contact, recent travel.  She denies any pain in the hip area or lower back pain.  ED Course: She is hemodynamically stable except tachycardia and hypertension. Temp 98.6, HR 100, RR 18, BP 146/80, SPO2 99% on room air Labs include sodium 136, potassium 4.0, chloride 103, bicarb 25, glucose 202, BUN 18, creatinine 0.78, calcium 9.2, anion gap 8, alkaline phosphatase 46, albumin 4.0, lipase 57, AST 20, ALT 19, total protein 7.1, total bilirubin 0.1, troponin 3> 4, WBC 11.9, hemoglobin 13.0, hematocrit 39.6, MCV 90.6, platelet 228, influenza negative, COVID-negative, UA unremarkable, CT head : No acute intracranial abnormality. Chest x-ray : No acute cardiopulmonary process.   Review of Systems: Review of Systems  Constitutional: Negative.   HENT: Negative.    Eyes: Negative.   Respiratory: Negative.    Cardiovascular: Negative.   Gastrointestinal: Negative.   Genitourinary: Negative.   Musculoskeletal: Negative.   Skin: Negative.   Neurological:   Positive for weakness.  Endo/Heme/Allergies: Negative.   Psychiatric/Behavioral: Negative.      Past Medical History:  Diagnosis Date   Breast cancer ([place])    Diverticulosis    Hiatal hernia    Hypercholesteremia    Hypertension    Osteopenia    Osteoporosis     Past Surgical History:  Procedure Laterality Date   ABDOMINAL HYSTERECTOMY     IR RADIOLOGIST EVAL & MGMT  01/03/2018   IR RADIOLOGIST EVAL & MGMT  01/31/2018   IR RADIOLOGIST EVAL & MGMT  02/27/2018   IR RADIOLOGIST EVAL & MGMT  03/20/2018   IR RADIOLOGIST EVAL & MGMT  04/03/2018   MASTECTOMY  1999   LEFT     reports that she has never smoked. She has never used smokeless tobacco. No history on file for alcohol use and drug use.  No Known Allergies  Family History  Problem Relation Age of Onset   Arthritis Father    Stroke Father    Hypertension Mother     Family history reviewed and not pertinent .  Prior to Admission medications   Medication Sig Start Date End Date Taking? Authorizing Provider  ALPRAZolam Duanne Moron) 0.25 MG tablet Take 0.25 mg by mouth at bedtime as needed for anxiety or sleep.     [provider]  amLODipine (NORVASC) 5 MG tablet Take 0.5 tablets (2.5 mg total) by mouth daily. 12/06/18   Johnson, Clanford L, MD  Ascorbic Acid (VITAMIN C) 500 MG tablet Take 500 mg by mouth daily.      [provider]  aspirin 325 MG  EC tablet Take 325 mg by mouth daily.    [provider]  Cholecalciferol (VITAMIN D) 1000 UNITS capsule Take 1,000 Units by mouth daily.      [provider]  Coenzyme Q10 (CO Q-10) 200 MG CAPS Take 1 capsule by mouth daily.     [provider]  diphenoxylate-atropine (LOMOTIL) 2.5-0.025 MG tablet Take 1 tablet by mouth 4 (four) times daily as needed for diarrhea or loose stools. 12/06/18   Johnson, Clanford L, MD  ezetimibe (ZETIA) 10 MG tablet Take 10 mg by mouth daily. 02/26/16   [provider]  metoprolol succinate (TOPROL-XL) 25  MG 24 hr tablet TAKE 1/2 TABLET BY MOUTH DAILY Patient taking differently: Take 12.5 mg by mouth daily.  10/01/17   Nahser, Wonda Cheng, MD  Multiple Vitamin (MULTIVITAMIN) tablet Take 1 tablet by mouth daily.      [provider]  omeprazole (PRILOSEC) 20 MG capsule Take 1 capsule (20 mg total) by mouth daily. 05/27/15   Darlin Coco, MD  PREVIDENT 5000 BOOSTER PLUS 1.1 % PSTE Place onto teeth 2 (two) times daily. 03/03/21   [provider]  raloxifene (EVISTA) 60 MG tablet TAKE 1 TABLET BY MOUTH ONCE DAILY Patient taking differently: Take 60 mg by mouth daily.  12/23/14   Darlin Coco, MD  saccharomyces boulardii (FLORASTOR) 250 MG capsule Take 1 capsule (250 mg total) by mouth 2 (two) times daily. 12/06/18   Johnson, Clanford L, MD  vitamin B-12 (CYANOCOBALAMIN) 500 MCG tablet Take 500 mcg by mouth daily.     [provider]    Physical Exam: Vitals:   07/05/21 1445 07/05/21 1500 07/05/21 1600 07/05/21 1700  BP:  (!) 147/74 (!) 150/74 (!) 148/73  Pulse: (!) 103 92 92 100  Resp:  16 18 18   Temp:      TempSrc:      SpO2: 99% 99% 99% 100%    Constitutional: Appears comfortable, not in any acute distress. Vitals:   07/05/21 1445 07/05/21 1500 07/05/21 1600 07/05/21 1700  BP:  (!) 147/74 (!) 150/74 (!) 148/73  Pulse: (!) 103 92 92 100  Resp:  16 18 18   Temp:      TempSrc:      SpO2: 99% 99% 99% 100%   Eyes: PERRL, lids and conjunctivae normal ENMT: Mucous membranes are moist. Posterior pharynx without exudate.  Normal dentition.  Neck: normal, supple, no masses, no thyromegaly Respiratory: Clear to auscultation bilaterally, no wheezing, no crackles,  no accessory muscle use. Cardiovascular: S1-S2 heard, regular rate and rhythm, no murmur. Abdomen: Abdomen is soft, non tender, non distended, BS+ Musculoskeletal: Left leg weakness, range of motion normal. Skin: no rashes, lesions, ulcers. No induration Neurologic: CN 2-12 grossly intact. Sensation  intact, Left leg weakness++ Psychiatric: Normal judgment and insight. Alert and oriented x 3. Normal mood.    Labs on Admission: I have personally reviewed following labs and imaging studies  CBC: Recent Labs  Lab 07/05/21 1134  WBC 11.9*  HGB 13.0  HCT 39.6  MCV 90.6  PLT 332   Basic Metabolic Panel: Recent Labs  Lab 07/05/21 1134  NA 136  K 4.1  CL 103  CO2 25  GLUCOSE 202*  BUN 18  CREATININE 0.78  CALCIUM 9.2   GFR: CrCl cannot be calculated (Unknown ideal weight.). Liver Function Tests: Recent Labs  Lab 07/05/21 1134  AST 20  ALT 19  ALKPHOS 46  BILITOT 0.1*  PROT 7.1  ALBUMIN 4.0  Recent Labs  Lab 07/05/21 1134  LIPASE 57*   No results for input(s): AMMONIA in the last 168 hours. Coagulation Profile: No results for input(s): INR, PROTIME in the last 168 hours. Cardiac Enzymes: No results for input(s): CKTOTAL, CKMB, CKMBINDEX, TROPONINI in the last 168 hours. BNP (last 3 results) No results for input(s): PROBNP in the last 8760 hours. HbA1C: No results for input(s): HGBA1C in the last 72 hours. CBG: No results for input(s): GLUCAP in the last 168 hours. Lipid Profile: No results for input(s): CHOL, HDL, LDLCALC, TRIG, CHOLHDL, LDLDIRECT in the last 72 hours. Thyroid Function Tests: No results for input(s): TSH, T4TOTAL, FREET4, T3FREE, THYROIDAB in the last 72 hours. Anemia Panel: No results for input(s): VITAMINB12, FOLATE, FERRITIN, TIBC, IRON, RETICCTPCT in the last 72 hours. Urine analysis:    Component Value Date/Time   COLORURINE YELLOW 07/05/2021 [place] 07/05/2021 1134   LABSPEC 1.013 07/05/2021 1134   PHURINE 7.0 07/05/2021 1134   GLUCOSEU 150 (A) 07/05/2021 1134   HGBUR NEGATIVE 07/05/2021 1134   BILIRUBINUR NEGATIVE 07/05/2021 1134   KETONESUR NEGATIVE 07/05/2021 1134   PROTEINUR NEGATIVE 07/05/2021 1134   NITRITE NEGATIVE 07/05/2021 1134   LEUKOCYTESUR NEGATIVE 07/05/2021 1134    Radiological Exams on  Admission: CT Head Wo Contrast  Result Date: 07/05/2021 CLINICAL DATA:  Mental status change EXAM: CT HEAD WITHOUT CONTRAST TECHNIQUE: Contiguous axial images were obtained from the base of the skull through the vertex without intravenous contrast. RADIATION DOSE REDUCTION: This exam was performed according to the departmental dose-optimization program which includes automated exposure control, adjustment of the mA and/or kV according to patient size and/or use of iterative reconstruction technique. COMPARISON:  None. FINDINGS: Brain: No evidence of acute infarction, hemorrhage, hydrocephalus, extra-axial collection or mass lesion/mass effect. Vascular: No hyperdense vessel or unexpected calcification. Skull: Normal. Negative for fracture or focal lesion. Sinuses/Orbits: No acute finding. Other: None. IMPRESSION: No acute intracranial abnormality. Electronically Signed   By: Yetta Glassman M.D.   On: 07/05/2021 12:45   DG Chest Port 1 View  Result Date: 07/05/2021 CLINICAL DATA:  Weakness EXAM: PORTABLE CHEST 1 VIEW COMPARISON:  Chest radiograph dated December 02, 2018 FINDINGS: The heart size and mediastinal contours are within normal limits. Atherosclerotic calcification of the aortic arch. Moderate hiatal hernia. Both lungs are clear. Left axillary surgical staples, unchanged. No acute osseous abnormality. IMPRESSION: No acute cardiopulmonary process. Electronically Signed   By: Keane Police D.O.   On: 07/05/2021 13:17    EKG: Independently reviewed.  Sinus tachycardia no ST-T wave changes.  Assessment/Plan Principal Problem:   Left leg weakness Active Problems:   Hypercholesterolemia   History of breast cancer   HTN (hypertension)   GERD (gastroesophageal reflux disease)   Anxiety   Type II diabetes mellitus with renal manifestations (HCC)  Generalized weakness : Patient presented with generalized weakness since morning,  more pronounced in the left leg. Patient denies any trauma, injury or  fall.  Patient report receiving COVID-vaccine few days back. CT head no acute abnormality. There is obvious weakness noted on the left leg. Obtain MRI brain to rule out a stroke. Obtain x-ray lumbosacral spine.  Essential hypertension: Continue metoprolol, amlodipine  Hypercholesterolemia: Continue Zetia  Diabetes mellitus: Diet controlled. Check FS  ACQHS  Anxiety disorder: Continue alprazolam  DVT prophylaxis: Lovenox Code Status: DNR Family Communication: Friend at bedside Disposition Plan:   Status is: Observation The patient remains OBS appropriate and will d/c before 2 midnights.  Admitted  for left leg weakness that started in the morning today, MRI ordered.  PT OT evaluation.   Consults called: None Admission status: Observation   Shawna Clamp MD [facility]  If 7PM-7AM, please contact night-coverage   07/05/2021, 5:51 PM

## 2021-07-05 NOTE — ED Notes (Signed)
Ambulated Pt to bedside commode.

## 2021-07-05 NOTE — ED Notes (Signed)
Pt NAD in bed with family member at bedside. A/ox4, pt c/o sudden and progressive onset of weakness in  her legs starting yesterday. Pt denies headache, dizziness, visual disturbance, syncope, CP, SOB, ABD pain, n/v/d, GU symptoms, numbness/tingling, recent illness. Pt states "feels like legs will go out." NIHSS 0. VSS.

## 2021-07-05 NOTE — ED Notes (Signed)
Pt unable to ambulate without assistance, MD advised

## 2021-07-06 ENCOUNTER — Observation Stay (HOSPITAL_COMMUNITY): Payer: Medicare Other

## 2021-07-06 DIAGNOSIS — Z23 Encounter for immunization: Secondary | ICD-10-CM

## 2021-07-06 DIAGNOSIS — R252 Cramp and spasm: Secondary | ICD-10-CM

## 2021-07-06 DIAGNOSIS — R29898 Other symptoms and signs involving the musculoskeletal system: Secondary | ICD-10-CM | POA: Diagnosis not present

## 2021-07-06 LAB — COMPREHENSIVE METABOLIC PANEL
ALT: 17 U/L (ref 0–44)
AST: 22 U/L (ref 15–41)
Albumin: 3.5 g/dL (ref 3.5–5.0)
Alkaline Phosphatase: 42 U/L (ref 38–126)
Anion gap: 11 (ref 5–15)
BUN: 16 mg/dL (ref 8–23)
CO2: 23 mmol/L (ref 22–32)
Calcium: 8.8 mg/dL — ABNORMAL LOW (ref 8.9–10.3)
Chloride: 101 mmol/L (ref 98–111)
Creatinine, Ser: 0.94 mg/dL (ref 0.44–1.00)
GFR, Estimated: 58 mL/min — ABNORMAL LOW (ref >60)
Glucose, Bld: 236 mg/dL — ABNORMAL HIGH (ref 70–99)
Potassium: 3.2 mmol/L — ABNORMAL LOW (ref 3.5–5.1)
Sodium: 135 mmol/L (ref 135–145)
Total Bilirubin: 0.5 mg/dL (ref 0.3–1.2)
Total Protein: 6.1 g/dL — ABNORMAL LOW (ref 6.5–8.1)

## 2021-07-06 LAB — CBC
HCT: 39.1 % (ref 36.0–46.0)
Hemoglobin: 12.7 g/dL (ref 12.0–15.0)
MCH: 30.1 pg (ref 26.0–34.0)
MCHC: 32.5 g/dL (ref 30.0–36.0)
MCV: 92.7 fL (ref 80.0–100.0)
Platelets: 214 10*3/uL (ref 150–400)
RBC: 4.22 MIL/uL (ref 3.87–5.11)
RDW: 13.2 % (ref 11.5–15.5)
WBC: 10.5 10*3/uL (ref 4.0–10.5)
nRBC: 0 % (ref 0.0–0.2)

## 2021-07-06 LAB — CK: Total CK: 47 U/L (ref 38–234)

## 2021-07-06 LAB — MAGNESIUM: Magnesium: 2.1 mg/dL (ref 1.7–2.4)

## 2021-07-06 LAB — PHOSPHORUS: Phosphorus: 3.3 mg/dL (ref 2.5–4.6)

## 2021-07-06 MED ORDER — TRAMADOL HCL 50 MG PO TABS
25.0000 mg | ORAL_TABLET | Freq: Once | ORAL | Status: AC
  Administered 2021-07-06: 25 mg via ORAL
  Filled 2021-07-06: qty 1

## 2021-07-06 NOTE — Plan of Care (Signed)

## 2021-07-06 NOTE — Assessment & Plan Note (Addendum)
Follow BG's Diet controlled Follow A1c (6.4)

## 2021-07-06 NOTE — Hospital Course (Addendum)
88 yo with hx breast cancer, HLD, HTN, diet controlled diabetes who presented on 2/7 with generalized weakness and progressive difficulty walking.  She noted progressive LLE weakness and presented to the ED.  She's been admitted for additional workup of her generalized and left lower extremity weakness.  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  Neurology is following, she's now s/p 5 days of solumedrol at 500 mg daily.  Will discharge to SNF with neurology follow up.  Continue her aspirin.    See below for additional details

## 2021-07-06 NOTE — Assessment & Plan Note (Addendum)
New LLE weakness Previously independent Negative MRI brain MRI lumbar spine with moderate levoscoliosis and multilevel lumbar spondylosis as described above, mild to moderate left lateral recess stenosis at L4-L5 could affect the descending L L5 nerve root C spine MRI with moderate L neuroforaminal stenosis at C6-7, erosive change of dens suggestive of CPPD T spine MRI with short segment patchy non expansile non compressive increased T2 signal in anterior cord at T6 without enhancement Repeat MRI with restricted diffusion at site of ventral cord T2 signal abnormality running length of T6 vertebral body - spinal cord infract vs demyelinating disease? 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  Neurology recommending follow up outpatient, may recommend imaging outpatient to clarify infarct vs related to inflammatory reaction from vaccine.  Recommend continuing aspirin.  Will hold off on statin with hx intolerance and age.   A1c 6.4 LDL 139, continue zetia

## 2021-07-06 NOTE — Assessment & Plan Note (Signed)
zetia 

## 2021-07-06 NOTE — Assessment & Plan Note (Signed)
xanax

## 2021-07-06 NOTE — Evaluation (Addendum)
Physical Therapy Evaluation Patient Details Name: KIMONI PICKERILL MRN: [id number] DOB: 07/03/1928 Today's Date: 07/06/2021  History of Present Illness  88 yo female admitted with L LE weakness. Hx of fibromyalgia, osteoporosis, breast cancer s/p mastectomy, COVID, osteoporosis, osteopenia.  Clinical Impression  On eval, pt required Min A for mobility. She walked ~15 feet with a RW after pivoting to bsc. New L LE weakness: DF/PF 2/5, hip flex 2/5, knee ext 3+/5. Pt denied numbness. Per chart review MRI brain (-). Could consider CT of lumbar spine if MD feels it is warranted. Will plan to follow and progress activity as tolerated. Discussed d/c plan-pt lives alone and was independent prior to admission. She is unsure if she can manage at home alone at this time although she prefers to return home. Recommend TOC consult.        Recommendations for follow up therapy are one component of a multi-disciplinary discharge planning process, led by the attending physician.  Recommendations may be updated based on patient status, additional functional criteria and insurance authorization.  Follow Up Recommendations Skilled nursing-short term rehab (<3 hours/day)     Assistance Recommended at Discharge Frequent or constant Supervision/Assistance  Patient can return home with the following  A little help with walking and/or transfers;A little help with bathing/dressing/bathroom;Assistance with cooking/housework;Help with stairs or ramp for entrance;Assist for transportation    Equipment Recommendations Rolling walker (2 wheels) (youth height)  Recommendations for Other Services  OT consult    Functional Status Assessment Patient has had a recent decline in their functional status and demonstrates the ability to make significant improvements in function in a reasonable and predictable amount of time.     Precautions / Restrictions Precautions Precautions: Fall Restrictions Weight Bearing Restrictions: No       Mobility  Bed Mobility Overal bed mobility: Needs Assistance Bed Mobility: Supine to Sit     Supine to sit: Supervision, HOB elevated     General bed mobility comments: Increased time. Pt relied on bedrai    Transfers Overall transfer level: Needs assistance Equipment used: None, Rolling walker (2 wheels) Transfers: Sit to/from Stand, Bed to chair/wheelchair/BSC Sit to Stand: Min assist     Squat pivot transfers: Min assist     General transfer comment: Min A to stand with RW and to peform squat pivot to bsc without a device. Cues for safety, technique, hand placement.    Ambulation/Gait Ambulation/Gait assistance: Min assist Gait Distance (Feet): 15 Feet Assistive device: Rolling walker (2 wheels) Gait Pattern/deviations: Step-to pattern, Trunk flexed       General Gait Details: Cues for safety, posture, sequence, technique (step to pattern) with L LE ataxia. Assist to steady throughout short distance. Pt tolerated well-some fatigue  Stairs            Wheelchair Mobility    Modified Rankin (Stroke Patients Only)       Balance Overall balance assessment: Needs assistance         Standing balance support: Bilateral upper extremity supported Standing balance-Leahy Scale: Poor                               Pertinent Vitals/Pain Pain Assessment Pain Assessment: Faces Faces Pain Scale: Hurts little more Pain Location: legs Pain Descriptors / Indicators: Spasm, Discomfort Pain Intervention(s): Monitored during session    Home Living Family/patient expects to be discharged to:: Private residence Living Arrangements: Alone Available Help at Discharge: Family  Home Access: Stairs to enter Entrance Stairs-Rails: Right Entrance Stairs-Number of Steps: 3   Home Layout: One level Home Equipment: Cane - single point      Prior Function Prior Level of Function : Independent/Modified Independent             Mobility  Comments: very independent. drives.       Hand Dominance        Extremity/Trunk Assessment   Upper Extremity Assessment Upper Extremity Assessment: Defer to OT evaluation    Lower Extremity Assessment Lower Extremity Assessment: LLE deficits/detail LLE Deficits / Details: DF/PF 2/5, hip flex 2/5, knee ext 3+/5    Cervical / Trunk Assessment Cervical / Trunk Assessment: Normal  Communication   Communication: No difficulties  Cognition Arousal/Alertness: Awake/alert Behavior During Therapy: WFL for tasks assessed/performed Overall Cognitive Status: Within Functional Limits for tasks assessed                                          General Comments      Exercises     Assessment/Plan    PT Assessment Patient needs continued PT services  PT Problem List Decreased strength;Decreased mobility;Decreased activity tolerance;Decreased balance;Decreased knowledge of use of DME       PT Treatment Interventions DME instruction;Gait training;Therapeutic activities;Therapeutic exercise;Patient/family education;Balance training;Functional mobility training;Stair training    PT Goals (Current goals can be found in the Care Plan section)  Acute Rehab PT Goals Patient Stated Goal: to regain independence/PLOF PT Goal Formulation: With patient Time For Goal Achievement: 07/20/21 Potential to Achieve Goals: Good    Frequency Min 3X/week     Co-evaluation               AM-PAC PT "6 Clicks" Mobility  Outcome Measure Help needed turning from your back to your side while in a flat bed without using bedrails?: None Help needed moving from lying on your back to sitting on the side of a flat bed without using bedrails?: A Little Help needed moving to and from a bed to a chair (including a wheelchair)?: A Little Help needed standing up from a chair using your arms (e.g., wheelchair or bedside chair)?: A Little Help needed to walk in hospital room?: A  Little Help needed climbing 3-5 steps with a railing? : A Lot 6 Click Score: 18    End of Session Equipment Utilized During Treatment: Gait belt Activity Tolerance: Patient tolerated treatment well Patient left: in chair;with call bell/phone within reach;with chair alarm set   PT Visit Diagnosis: Muscle weakness (generalized) (M62.81);Difficulty in walking, not elsewhere classified (R26.2);Other abnormalities of gait and mobility (R26.89)    Time: 9169-4503 PT Time Calculation (min) (ACUTE ONLY): 26 min   Charges:   PT Evaluation $PT Eval Moderate Complexity: 1 Mod PT Treatments $Gait Training: 8-22 mins      Doreatha Massed, PT Acute Rehabilitation  Office: [phone] Pager: [phone]

## 2021-07-06 NOTE — Evaluation (Signed)
Occupational Therapy Evaluation Patient Details Name: [name] MRN: [id number] DOB: 1928-08-18 Today's Date: 07/06/2021   History of Present Illness 88 yo female admitted with L LE weakness. Hx of fibromyalgia, osteoporosis, breast cancer s/p mastectomy, COVID, osteoporosis, osteopenia.   Clinical Impression   [name] is a 88 year old woman who is normally very independent and lives alone. She drives and does her own grocery shopping and she has a little assistance for house cleaning. She has friends that can assist her at times but no family support. On evaluation she presents with decreased ROM and strength of left lower extremity, impaired balance and decreased activity tolerance resulting in a sudden decline in functional abilities. She requires set up assistance and seated position for UB ADLs and min - mod assist for LB ADLs. She is only able to ambulate a short distance with difficulty controlling LE due to weakness. With taking steps backwards back to bed she had a loss of balance and landing on the bed. She was min assist to return to supine. Patient will benefit from skilled OT services while in hospital to improve deficits and learn compensatory strategies as needed in order to return to PLOF.  Recommend short term rehab at discharge prior to return home.     Recommendations for follow up therapy are one component of a multi-disciplinary discharge planning process, led by the attending physician.  Recommendations may be updated based on patient status, additional functional criteria and insurance authorization.   Follow Up Recommendations  Skilled nursing-short term rehab (<3 hours/day)    Assistance Recommended at Discharge Frequent or constant Supervision/Assistance  Patient can return home with the following A little help with walking and/or transfers;A little help with bathing/dressing/bathroom    Functional Status Assessment  Patient has had a recent decline in their  functional status and demonstrates the ability to make significant improvements in function in a reasonable and predictable amount of time.  Equipment Recommendations  Other (comment) (defer to next venue)    Recommendations for Other Services       Precautions / Restrictions Precautions Precautions: Fall Restrictions Weight Bearing Restrictions: No      Mobility Bed Mobility                    Transfers                          Balance Overall balance assessment: Needs assistance Sitting-balance support: No upper extremity supported Sitting balance-Leahy Scale: Good     Standing balance support: Reliant on assistive device for balance Standing balance-Leahy Scale: Poor Standing balance comment: reliant onwalker                           ADL either performed or assessed with clinical judgement   ADL Overall ADL's : Needs assistance/impaired Eating/Feeding: Independent   Grooming: Set up;Sitting   Upper Body Bathing: Set up;Sitting   Lower Body Bathing: Minimal assistance;Sit to/from stand   Upper Body Dressing : Set up;Sitting   Lower Body Dressing: Minimal assistance;Sit to/from stand   Toilet Transfer: BSC/3in1;Rolling walker (2 wheels);Min guard   Toileting- Clothing Manipulation and Hygiene: Moderate assistance;Sit to/from stand       Functional mobility during ADLs: Rolling walker (2 wheels);Min guard General ADL Comments: Patient predominantly min guard for standing and short ambulation - but with taking steps backward patient had LOB back onto bed  therefore min assist for safety. Patient exhibits weakness in LLE and decreased motor control. Patient able to perform most ADLs from seated position with setup and min guard for standing.     Vision Baseline Vision/History: 1 Wears glasses Patient Visual Report: No change from baseline       Perception     Praxis      Pertinent Vitals/Pain Pain Assessment Pain  Assessment: No/denies pain     Hand Dominance Right   Extremity/Trunk Assessment Upper Extremity Assessment Upper Extremity Assessment: RUE deficits/detail;LUE deficits/detail RUE Deficits / Details: WFL ROM, shoulder strength 3+/5 (potentiaal rotator cuff injury) otherwise 5/5 RUE Sensation: WNL RUE Coordination: WNL LUE Deficits / Details: WFL ROM, shoulder strength 4-/5 otherwise 5/5 LUE Sensation: WNL LUE Coordination: WNL   Lower Extremity Assessment Lower Extremity Assessment: Defer to PT evaluation LLE Deficits / Details: DF/PF 2/5, hip flex 2/5, knee ext 3+/5   Cervical / Trunk Assessment Cervical / Trunk Assessment: Normal   Communication Communication Communication: No difficulties   Cognition Arousal/Alertness: Awake/alert Behavior During Therapy: WFL for tasks assessed/performed Overall Cognitive Status: Within Functional Limits for tasks assessed                                       General Comments       Exercises     Shoulder Instructions      Home Living Family/patient expects to be discharged to:: Private residence Living Arrangements: Alone Available Help at Discharge: Family Type of Home: House Home Access: Stairs to enter Technical brewer of Steps: 3 Entrance Stairs-Rails: Right Home Layout: One level     Bathroom Shower/Tub: Teacher, early years/pre: Standard     Home Equipment: Kasandra Knudsen - single point          Prior Functioning/Environment Prior Level of Function : Independent/Modified Independent             Mobility Comments: very independent. drives.          OT Problem List: Decreased strength;Decreased activity tolerance;Impaired balance (sitting and/or standing);Decreased knowledge of use of DME or AE      OT Treatment/Interventions: Self-care/ADL training;DME and/or AE instruction;Therapeutic activities;Patient/family education;Balance training    OT Goals(Current goals can be found  in the care plan section) Acute Rehab OT Goals Patient Stated Goal: leg to get stronger OT Goal Formulation: With patient Time For Goal Achievement: 07/20/21 Potential to Achieve Goals: Good  OT Frequency: Min 2X/week    Co-evaluation              AM-PAC OT "6 Clicks" Daily Activity     Outcome Measure Help from another person eating meals?: None Help from another person taking care of personal grooming?: A Little Help from another person toileting, which includes using toliet, bedpan, or urinal?: A Lot Help from another person bathing (including washing, rinsing, drying)?: A Little Help from another person to put on and taking off regular upper body clothing?: A Little Help from another person to put on and taking off regular lower body clothing?: A Lot 6 Click Score: 17   End of Session Equipment Utilized During Treatment: Rolling walker (2 wheels) Nurse Communication: Mobility status  Activity Tolerance:   Patient left: in bed;with call bell/phone within reach;with bed alarm set  OT Visit Diagnosis: Other abnormalities of gait and mobility (R26.89)  Time: 8251-8984 OT Time Calculation (min): 20 min Charges:  OT General Charges $OT Visit: 1 Visit OT Evaluation $OT Eval Low Complexity: 1 Low  Jerid Catherman, OTR/L [place]  Office [phone] Pager: [place] 07/06/2021, 2:14 PM

## 2021-07-06 NOTE — TOC Initial Note (Signed)
Transition of Care [facility]) - Initial/Assessment Note    Patient Details  Name: [name] MRN: [id number] Date of Birth: [birth year]  Transition of Care [facility]) CM/SW Contact:    Leeroy Cha, RN Phone Number: 07/06/2021, 10:41 AM  Clinical Narrative:                  Transition of Care [facility]) Screening Note   Patient Details  Name: [name] Date of Birth: April 29, 1929   Transition of Care [facility]) CM/SW Contact:    Leeroy Cha, RN Phone Number: 07/06/2021, 10:41 AM    Transition of Care Department [facility]) has reviewed patient and no TOC needs have been identified at this time. We will continue to monitor patient advancement through interdisciplinary progression rounds. If new patient transition needs arise, please place a TOC consult.    Expected Discharge Plan: Home/Self Care Barriers to Discharge: Continued Medical Work up   Patient Goals and CMS Choice Patient states their goals for this hospitalization and ongoing recovery are:: to go back home CMS Medicare.gov Compare Post Acute Care list provided to:: Patient    Expected Discharge Plan and Services Expected Discharge Plan: Home/Self Care   Discharge Planning Services: CM Consult   Living arrangements for the past 2 months: Single Family Home                                      Prior Living Arrangements/Services Living arrangements for the past 2 months: Single Family Home   Patient language and need for interpreter reviewed:: Yes Do you feel safe going back to the place where you live?: Yes            Criminal Activity/Legal Involvement Pertinent to Current Situation/Hospitalization: No - Comment as needed  Activities of Daily Living Home Assistive Devices/Equipment: Eyeglasses, Hearing aid (reading glasses, bilateral hearing aides) ADL Screening (condition at time of admission) Patient's cognitive ability adequate to safely complete daily activities?: Yes Is the patient deaf or have  difficulty hearing?: Yes (wears bilateral hearing aides) Does the patient have difficulty seeing, even when wearing glasses/contacts?: No (hx bilateral cataract surgery) Does the patient have difficulty concentrating, remembering, or making decisions?: No Patient able to express need for assistance with ADLs?: Yes Does the patient have difficulty dressing or bathing?: No Independently performs ADLs?: No Communication: Independent Dressing (OT): Independent Grooming: Independent Feeding: Independent Bathing: Independent Toileting: Needs assistance Is this a change from baseline?: Change from baseline, expected to last <3 days In/Out Bed: Needs assistance Is this a change from baseline?: Change from baseline, expected to last <3 days Walks in Home: Needs assistance Is this a change from baseline?: Change from baseline, expected to last <3 days Does the patient have difficulty walking or climbing stairs?: No Weakness of Legs: Both Weakness of Arms/Hands: Both  Permission Sought/Granted                  Emotional Assessment Appearance:: Appears stated age     Orientation: : Oriented to Self, Oriented to Place, Oriented to  Time, Oriented to Situation Alcohol / Substance Use: Not Applicable Psych Involvement: No (comment)  Admission diagnosis:  Left leg weakness [R29.898] Generalized weakness [R53.1] Patient Active Problem List   Diagnosis Date Noted   Left leg weakness 07/05/2021   Small bowel obstruction ([place]) 12/02/2018   Malnutrition of moderate degree 12/25/2017   Diverticulitis of large intestine with  abscess 12/19/2017   Sepsis ([place]) 12/19/2017   HTN (hypertension) 12/19/2017   History of prediabetes 12/19/2017   GERD (gastroesophageal reflux disease) 12/19/2017   Anxiety 12/19/2017   Acute renal failure superimposed on stage 2 chronic kidney disease ([place]) 12/19/2017   Type II diabetes mellitus with renal manifestations ([place]) 12/19/2017   Pancreatic cyst  12/19/2017   Hyperglycemia    Aortic systolic murmur on examination 04/08/2015   Malaise and fatigue 04/15/2012   Right carotid bruit 10/05/2011   Hiatal hernia 04/05/2011   Benign hypertensive heart disease without heart failure 09/01/2010   Hypercholesterolemia 09/01/2010   History of breast cancer 09/01/2010   Osteopenia 09/01/2010   PCP:  Crist Infante, MD Pharmacy:   [facility] [address], [place] - [address] [place] [address] Phone: [phone] Fax: [phone]  [facility] - [place], [place] - [address] [place] [place] [address] Phone: [phone] Fax: [phone]  [facility] [place], [place] [address] [place] [address] Phone: [phone] Fax: [phone]  [facility] [place], [place] AT [place] [address] Phone: [phone] Fax: [phone]     Social Determinants of Health (SDOH) Interventions    Readmission Risk Interventions No flowsheet data found.

## 2021-07-06 NOTE — Assessment & Plan Note (Signed)
Metoprolol, amlodipine

## 2021-07-06 NOTE — Consult Note (Incomplete)
NEURO HOSPITALIST CONSULT NOTE   Requestig physician: Dr. Florene Glen  Reason for Consult: Progressive LLE weakness on generalized weakness  History obtained from:  Patient   Chart  Patient and Chart   ***  HPI:                                                                                                                                          [name] is an 88 y.o. female with a PMHx of breast cancer, hypercholesterolemia, hypertension, diet-controlled diabetes and osteoporosis who presented to the [facility] ED on Tuesday with a chief complaint of generalized weakness since that morning. At baseline, she lives independently and is fully functional and ambulatory. She had been in her USOH on Monday. She had felt the weakness on Tuesday on waking up. She described it as generalized and as progressive, with the weakness "getting worse" and also with more pronounced weakness of her LLE. She denied any falls, trauma or other injury to the leg.  Of note, she did receive a COVID-vaccine a few days back and thought it could be related to that. She has not had any fevers or chills. Denied any hip or LBP.    ED Course: She is hemodynamically stable except tachycardia and hypertension. Temp 98.6, HR 100, RR 18, BP 146/80, SPO2 99% on room air Labs include sodium 136, potassium 4.0, chloride 103, bicarb 25, glucose 202, BUN 18, creatinine 0.78, calcium 9.2, anion gap 8, alkaline phosphatase 46, albumin 4.0, lipase 57, AST 20, ALT 19, total protein 7.1, total bilirubin 0.1, troponin 3> 4, WBC 11.9, hemoglobin 13.0, hematocrit 39.6, MCV 90.6, platelet 228, influenza negative, COVID-negative, UA unremarkable, CT head : No acute intracranial abnormality. Chest x-ray : No acute cardiopulmonary process.  Past Medical History:  Diagnosis Date   Breast cancer ([place])    Diverticulosis    Hiatal hernia    Hypercholesteremia    Hypertension    Osteopenia    Osteoporosis     Past Surgical  History:  Procedure Laterality Date   ABDOMINAL HYSTERECTOMY     IR RADIOLOGIST EVAL & MGMT  01/03/2018   IR RADIOLOGIST EVAL & MGMT  01/31/2018   IR RADIOLOGIST EVAL & MGMT  02/27/2018   IR RADIOLOGIST EVAL & MGMT  03/20/2018   IR RADIOLOGIST EVAL & MGMT  04/03/2018   MASTECTOMY  1999   LEFT    Family History  Problem Relation Age of Onset   Arthritis Father    Stroke Father    Hypertension Mother             ***  Family History: *** Mother *** Father ***  Social History:  reports that she has never smoked. She has never used smokeless tobacco. No history on file for alcohol  use and drug use.  No Known Allergies  MEDICATIONS:                                                                                                                     {medication reviewed/display:3041432}   ROS:                                                                                                                                       History obtained from {source of history:310783}  General ROS: negative for - chills, fatigue, fever, night sweats, weight gain or weight loss Psychological ROS: negative for - behavioral disorder, hallucinations, memory difficulties, mood swings or suicidal ideation Ophthalmic ROS: negative for - blurry vision, double vision, eye pain or loss of vision ENT ROS: negative for - epistaxis, nasal discharge, oral lesions, sore throat, tinnitus or vertigo Allergy and Immunology ROS: negative for - hives or itchy/watery eyes Hematological and Lymphatic ROS: negative for - bleeding problems, bruising or swollen lymph nodes Endocrine ROS: negative for - galactorrhea, hair pattern changes, polydipsia/polyuria or temperature intolerance Respiratory ROS: negative for - cough, hemoptysis, shortness of breath or wheezing Cardiovascular ROS: negative for - chest pain, dyspnea on exertion, edema or irregular heartbeat Gastrointestinal ROS: negative for - abdominal pain, diarrhea,  hematemesis, nausea/vomiting or stool incontinence Genito-Urinary ROS: negative for - dysuria, hematuria, incontinence or urinary frequency/urgency Musculoskeletal ROS: negative for - joint swelling or muscular weakness Neurological ROS: as noted in HPI Dermatological ROS: negative for rash and skin lesion changes   Blood pressure 136/63, pulse 88, temperature 98.1 F (36.7 C), temperature source Oral, resp. rate 17, height 4\' 10"  (1.473 m), SpO2 97 %.   General Examination:                                                                                                       Physical Exam  HEENT-  Normocephalic, no lesions, without obvious abnormality.  Normal external eye and conjunctiva.   Cardiovascular- S1-S2 audible,  pulses palpable throughout   Lungs-no rhonchi or wheezing noted, no excessive working breathing.  Saturations within normal limits Abdomen- All 4 quadrants palpated and nontender Extremities- Warm, dry and intact Musculoskeletal-no joint tenderness, deformity or swelling Skin-warm and dry, no hyperpigmentation, vitiligo, or suspicious lesions  Neurological Examination Mental Status: Alert, oriented, thought content appropriate.  Speech fluent without evidence of aphasia.  Able to follow 3 step commands without difficulty. Cranial Nerves: II: Discs flat bilaterally; Visual fields grossly normal,  III,IV, VI: ptosis not present, extra-ocular motions intact bilaterally pupils equal, round, reactive to light and accommodation V,VII: smile symmetric, facial light touch sensation normal bilaterally VIII: hearing normal bilaterally IX,X: uvula rises symmetrically XI: bilateral shoulder shrug XII: midline tongue extension Motor: Right : Upper extremity   5/5    Left:     Upper extremity   5/5  Lower extremity   5/5     Lower extremity   5/5 Tone and bulk:normal tone throughout; no atrophy noted Sensory: Pinprick and light touch intact throughout, bilaterally Deep  Tendon Reflexes: 2+ and symmetric throughout Plantars: Right: downgoing   Left: downgoing Cerebellar: normal finger-to-nose, normal rapid alternating movements and normal heel-to-shin test Gait: normal gait and station   Lab Results: Basic Metabolic Panel: Recent Labs  Lab 07/05/21 1134 07/05/21 1910 07/06/21 0350  NA 136  --  135  K 4.1  --  3.2*  CL 103  --  101  CO2 25  --  23  GLUCOSE 202*  --  236*  BUN 18  --  16  CREATININE 0.78 0.69 0.94  CALCIUM 9.2  --  8.8*  MG  --   --  2.1  PHOS  --   --  3.3    CBC: Recent Labs  Lab 07/05/21 1134 07/05/21 1910 07/06/21 0350  WBC 11.9* 11.0* 10.5  HGB 13.0 12.4 12.7  HCT 39.6 38.0 39.1  MCV 90.6 90.5 92.7  PLT 228 235 214    Cardiac Enzymes: Recent Labs  Lab 07/06/21 1622  CKTOTAL 47    Lipid Panel: No results for input(s): CHOL, TRIG, HDL, CHOLHDL, VLDL, LDLCALC in the last 168 hours.  Imaging: DG Sacrum/Coccyx  Result Date: 07/05/2021 CLINICAL DATA:  Weakness EXAM: SACRUM AND COCCYX - 2+ VIEW COMPARISON:  None. FINDINGS: Degenerative changes in the visualized lower lumbar spine and hips bilaterally. No visible sacral or coccygeal fracture. SI joints symmetric. IMPRESSION: No acute bony abnormality. Electronically Signed   By: Rolm Baptise M.D.   On: 07/05/2021 19:06   CT Head Wo Contrast  Result Date: 07/05/2021 CLINICAL DATA:  Mental status change EXAM: CT HEAD WITHOUT CONTRAST TECHNIQUE: Contiguous axial images were obtained from the base of the skull through the vertex without intravenous contrast. RADIATION DOSE REDUCTION: This exam was performed according to the departmental dose-optimization program which includes automated exposure control, adjustment of the mA and/or kV according to patient size and/or use of iterative reconstruction technique. COMPARISON:  None. FINDINGS: Brain: No evidence of acute infarction, hemorrhage, hydrocephalus, extra-axial collection or mass lesion/mass effect. Vascular: No  hyperdense vessel or unexpected calcification. Skull: Normal. Negative for fracture or focal lesion. Sinuses/Orbits: No acute finding. Other: None. IMPRESSION: No acute intracranial abnormality. Electronically Signed   By: Yetta Glassman M.D.   On: 07/05/2021 12:45   MR BRAIN WO CONTRAST  Result Date: 07/05/2021 CLINICAL DATA:  TIA.  Acute neuro deficit. EXAM: MRI HEAD WITHOUT CONTRAST TECHNIQUE: Multiplanar, multiecho pulse sequences of the brain and surrounding structures were obtained  without intravenous contrast. COMPARISON:  CT head 07/05/2021 FINDINGS: Brain: Generalized atrophy, typical for age. Negative for hydrocephalus. Moderate white matter hyperintensities bilaterally. Mild hyperintensity in the pons. Negative for acute infarct, hemorrhage, mass. Vascular: Negative for hyperdense vessel Skull and upper cervical spine: No acute skeletal abnormality. C1-2 degenerative change with prominent pannus. No significant stenosis. Sinuses/Orbits: Paranasal sinuses clear. Bilateral cataract extraction Other: None IMPRESSION: No acute abnormality Atrophy and moderate chronic microvascular ischemia Electronically Signed   By: Franchot Gallo M.D.   On: 07/05/2021 19:12   MR LUMBAR SPINE WO CONTRAST  Result Date: 07/06/2021 CLINICAL DATA:  Progressive left leg weakness. Remote history of breast cancer. EXAM: MRI LUMBAR SPINE WITHOUT CONTRAST TECHNIQUE: Multiplanar, multisequence MR imaging of the lumbar spine was performed. No intravenous contrast was administered. COMPARISON:  Lumbar spine x-rays from yesterday. CT abdomen pelvis dated December 02, 2018. FINDINGS: Segmentation:  Standard. Alignment:  Levoscoliosis.  Trace anterolisthesis at L5-S1. Vertebrae:  No fracture, evidence of discitis, or bone lesion. Conus medullaris and cauda equina: Conus extends to the L2 level. Conus and cauda equina appear normal. Paraspinal and other soft tissues: Negative. Disc levels: T11-T12: Tiny central disc protrusion.  No  stenosis. T12-L1:  Negative. L1-L2:  Circumferential disc osteophyte complex.  No stenosis. L2-L3: Circumferential disc osteophyte complex. Mild right lateral recess stenosis. Mild bilateral neuroforaminal stenosis. No spinal canal stenosis. L3-L4: Mild disc bulging with right-sided disc osteophyte complex. No stenosis. L4-L5: Circumferential disc bulging and endplate spurring. Superimposed small left subarticular disc protrusion. Moderate bilateral facet arthropathy. Mild to moderate left lateral recess stenosis. Mild bilateral neuroforaminal stenosis. No spinal canal stenosis. L5-S1: Left eccentric disc bulging. Moderate to severe bilateral facet arthropathy. Mild left greater than right lateral recess stenosis. Mild left neuroforaminal stenosis. No spinal canal or right neuroforaminal stenosis. IMPRESSION: 1. Moderate levoscoliosis and multilevel lumbar spondylosis as described above. Mild to moderate left lateral recess stenosis at L4-L5 could affect the descending left L5 nerve root. No high-grade spinal canal or neuroforaminal stenosis. Electronically Signed   By: Titus Dubin M.D.   On: 07/06/2021 15:24   DG Lumbar Spine 1 View  Result Date: 07/05/2021 CLINICAL DATA:  Weakness EXAM: LUMBAR SPINE - 1 VIEW COMPARISON:  None. FINDINGS: Leftward scoliosis centered in the mid lumbar spine. Evaluation is limited without lateral view. No visible fracture or focal bone lesion. Degenerative changes throughout the lumbar spine and in the hips bilaterally. SI joints symmetric. IMPRESSION: Limited evaluation with only single AP view submitted. No visible acute bony abnormality. Advanced degenerative changes. Electronically Signed   By: Rolm Baptise M.D.   On: 07/05/2021 19:06   DG Chest Port 1 View  Result Date: 07/05/2021 CLINICAL DATA:  Weakness EXAM: PORTABLE CHEST 1 VIEW COMPARISON:  Chest radiograph dated December 02, 2018 FINDINGS: The heart size and mediastinal contours are within normal limits.  Atherosclerotic calcification of the aortic arch. Moderate hiatal hernia. Both lungs are clear. Left axillary surgical staples, unchanged. No acute osseous abnormality. IMPRESSION: No acute cardiopulmonary process. Electronically Signed   By: Keane Police D.O.   On: 07/05/2021 13:17     Assessment: 1.   Recommendations: 1.    Electronically signed: Dr. Kerney Elbe 07/06/2021, 7:43 PM

## 2021-07-06 NOTE — Assessment & Plan Note (Addendum)
S/ p booster on 2/4  Avoid further covid vaccines with concern this is related to her LLE weakness

## 2021-07-06 NOTE — Progress Notes (Signed)
PROGRESS NOTE    [name]  ASN:[id number] DOB: 1928-10-18 DOA: 07/05/2021 PCP: Crist Infante, MD  Chief Complaint  Patient presents with   Weakness    Brief Narrative:  88 yo with hx breast cancer, HLD, HTN, diet controlled diabetes who presented on 2/7 with generalized weakness and progressive difficulty walking.  She noted progressive LLE weakness and presented to the ED.  She's been admitted for additional workup of her generalized and left lower extremity weakness.    Assessment & Plan:   Principal Problem:   Left leg weakness Active Problems:   Spasm   HTN (hypertension)   Type II diabetes mellitus with renal manifestations (HCC)   Hypercholesterolemia   Anxiety   COVID-19 vaccine administered   History of breast cancer   GERD (gastroesophageal reflux disease)   Assessment and Plan: * Left leg weakness- (present on admission) New LLE weakness Previously independent Negative MRI brain Will pursue MRI lumbar spine and consult neurology   Spasm Describes jerks and spasms of LLE Follow MRI as above, consider additional w/u CK, consider EEG  Type II diabetes mellitus with renal manifestations ([place])- (present on admission) Follow BG's Diet controlled Follow A1c   HTN (hypertension)- (present on admission) Metoprolol, amlodipine  Anxiety- (present on admission) xanax  Hypercholesterolemia- (present on admission) zetia  COVID-19 vaccine administered S/ p booster on 2/4    DVT prophylaxis: lovenox Code Status: DNR Family Communication: none Disposition:   Status is: Observation The patient will require care spanning > 2 midnights and should be moved to inpatient because: continued LLE weakness requiring neurology c/s    Consultants:  neurology  Procedures:  none  Antimicrobials:  Anti-infectives (From admission, onward)    None      Subjective: No new complaints Denies numbness, denies incontinence She notes "jerks and spasms" to  LLE  Objective: Vitals:   07/06/21 0130 07/06/21 0208 07/06/21 0556 07/06/21 1343  BP: 121/67 126/67 118/70 136/63  Pulse: 75 87 89 88  Resp: 16   17  Temp:  (!) 97.3 F (36.3 C) 97.9 F (36.6 C) 98.1 F (36.7 C)  TempSrc:  Oral Oral Oral  SpO2: 96% 97% 96% 97%  Height:  4\' 10"  (1.473 m)      Intake/Output Summary (Last 24 hours) at 07/06/2021 1532 Last data filed at 07/06/2021 0600 Gross per 24 hour  Intake 480 ml  Output --  Net 480 ml   There were no vitals filed for this visit.  Examination:  General exam: Appears calm and comfortable  Respiratory system: unlabored Cardiovascular system: RRR Gastrointestinal system: Abdomen is nondistended, soft and nontender.  Central nervous system: Alert and oriented. CN 2-12 intact.  5/5 strength to bilateral upper extremities.  4/5 strength to RLE, barely able to lift LLE off chair - +babinski bilaterally, worse on L - with some clonus L>R as well.   Extremities: no LEE Skin: No rashes, lesions or ulcers Psychiatry: Judgement and insight appear normal. Mood & affect appropriate.     Data Reviewed: I have personally reviewed following labs and imaging studies  CBC: Recent Labs  Lab 07/05/21 1134 07/05/21 1910 07/06/21 0350  WBC 11.9* 11.0* 10.5  HGB 13.0 12.4 12.7  HCT 39.6 38.0 39.1  MCV 90.6 90.5 92.7  PLT 228 235 193    Basic Metabolic Panel: Recent Labs  Lab 07/05/21 1134 07/05/21 1910 07/06/21 0350  NA 136  --  135  K 4.1  --  3.2*  CL 103  --  101  CO2 25  --  23  GLUCOSE 202*  --  236*  BUN 18  --  16  CREATININE 0.78 0.69 0.94  CALCIUM 9.2  --  8.8*  MG  --   --  2.1  PHOS  --   --  3.3    GFR: CrCl cannot be calculated (Unknown ideal weight.).  Liver Function Tests: Recent Labs  Lab 07/05/21 1134 07/06/21 0350  AST 20 22  ALT 19 17  ALKPHOS 46 42  BILITOT 0.1* 0.5  PROT 7.1 6.1*  ALBUMIN 4.0 3.5    CBG: No results for input(s): GLUCAP in the last 168 hours.   Recent Results (from  the past 240 hour(s))  Resp Panel by RT-PCR (Flu Amarrah Meinhart&B, Covid) Nasopharyngeal Swab     Status: None   Collection Time: 07/05/21 11:34 AM   Specimen: Nasopharyngeal Swab; Nasopharyngeal(NP) swabs in vial transport medium  Result Value Ref Range Status   SARS Coronavirus 2 by RT PCR NEGATIVE NEGATIVE Final    Comment: (NOTE) SARS-CoV-2 target nucleic acids are NOT DETECTED.  The SARS-CoV-2 RNA is generally detectable in upper respiratory specimens during the acute phase of infection. The lowest concentration of SARS-CoV-2 viral copies this assay can detect is 138 copies/mL. Edyth Glomb negative result does not preclude SARS-Cov-2 infection and should not be used as the sole basis for treatment or other patient management decisions. Konstantina Nachreiner negative result may occur with  improper specimen collection/handling, submission of specimen other than nasopharyngeal swab, presence of viral mutation(s) within the areas targeted by this assay, and inadequate number of viral copies(<138 copies/mL). Jillann Charette negative result must be combined with clinical observations, patient history, and epidemiological information. The expected result is Negative.  Fact Sheet for Patients:  EntrepreneurPulse.com.au  Fact Sheet for Healthcare Providers:  IncredibleEmployment.be  This test is no t yet approved or cleared by the [place] FDA and  has been authorized for detection and/or diagnosis of SARS-CoV-2 by FDA under an Emergency Use Authorization (EUA). This EUA will remain  in effect (meaning this test can be used) for the duration of the COVID-19 declaration under Section 564(b)(1) of the Act, 21 U.S.C.section 360bbb-3(b)(1), unless the authorization is terminated  or revoked sooner.       Influenza Lillan Mccreadie by PCR NEGATIVE NEGATIVE Final   Influenza B by PCR NEGATIVE NEGATIVE Final    Comment: (NOTE) The Xpert Xpress SARS-CoV-2/FLU/RSV plus assay is intended as an aid in the diagnosis of  influenza from Nasopharyngeal swab specimens and should not be used as Cayden Granholm sole basis for treatment. Nasal washings and aspirates are unacceptable for Xpert Xpress SARS-CoV-2/FLU/RSV testing.  Fact Sheet for Patients: EntrepreneurPulse.com.au  Fact Sheet for Healthcare Providers: IncredibleEmployment.be  This test is not yet approved or cleared by the [place] FDA and has been authorized for detection and/or diagnosis of SARS-CoV-2 by FDA under an Emergency Use Authorization (EUA). This EUA will remain in effect (meaning this test can be used) for the duration of the COVID-19 declaration under Section 564(b)(1) of the Act, 21 U.S.C. section 360bbb-3(b)(1), unless the authorization is terminated or revoked.  Performed at [facility], [place] [address] [place], [place] [address]          Radiology Studies: DG Sacrum/Coccyx  Result Date: 07/05/2021 CLINICAL DATA:  Weakness EXAM: SACRUM AND COCCYX - 2+ VIEW COMPARISON:  None. FINDINGS: Degenerative changes in the visualized lower lumbar spine and hips bilaterally. No visible sacral or coccygeal fracture. SI joints symmetric. IMPRESSION:  No acute bony abnormality. Electronically Signed   By: Rolm Baptise M.D.   On: 07/05/2021 19:06   CT Head Wo Contrast  Result Date: 07/05/2021 CLINICAL DATA:  Mental status change EXAM: CT HEAD WITHOUT CONTRAST TECHNIQUE: Contiguous axial images were obtained from the base of the skull through the vertex without intravenous contrast. RADIATION DOSE REDUCTION: This exam was performed according to the departmental dose-optimization program which includes automated exposure control, adjustment of the mA and/or kV according to patient size and/or use of iterative reconstruction technique. COMPARISON:  None. FINDINGS: Brain: No evidence of acute infarction, hemorrhage, hydrocephalus, extra-axial collection or mass lesion/mass effect. Vascular: No  hyperdense vessel or unexpected calcification. Skull: Normal. Negative for fracture or focal lesion. Sinuses/Orbits: No acute finding. Other: None. IMPRESSION: No acute intracranial abnormality. Electronically Signed   By: Yetta Glassman M.D.   On: 07/05/2021 12:45   MR BRAIN WO CONTRAST  Result Date: 07/05/2021 CLINICAL DATA:  TIA.  Acute neuro deficit. EXAM: MRI HEAD WITHOUT CONTRAST TECHNIQUE: Multiplanar, multiecho pulse sequences of the brain and surrounding structures were obtained without intravenous contrast. COMPARISON:  CT head 07/05/2021 FINDINGS: Brain: Generalized atrophy, typical for age. Negative for hydrocephalus. Moderate white matter hyperintensities bilaterally. Mild hyperintensity in the pons. Negative for acute infarct, hemorrhage, mass. Vascular: Negative for hyperdense vessel Skull and upper cervical spine: No acute skeletal abnormality. C1-2 degenerative change with prominent pannus. No significant stenosis. Sinuses/Orbits: Paranasal sinuses clear. Bilateral cataract extraction Other: None IMPRESSION: No acute abnormality Atrophy and moderate chronic microvascular ischemia Electronically Signed   By: Franchot Gallo M.D.   On: 07/05/2021 19:12   MR LUMBAR SPINE WO CONTRAST  Result Date: 07/06/2021 CLINICAL DATA:  Progressive left leg weakness. Remote history of breast cancer. EXAM: MRI LUMBAR SPINE WITHOUT CONTRAST TECHNIQUE: Multiplanar, multisequence MR imaging of the lumbar spine was performed. No intravenous contrast was administered. COMPARISON:  Lumbar spine x-rays from yesterday. CT abdomen pelvis dated December 02, 2018. FINDINGS: Segmentation:  Standard. Alignment:  Levoscoliosis.  Trace anterolisthesis at L5-S1. Vertebrae:  No fracture, evidence of discitis, or bone lesion. Conus medullaris and cauda equina: Conus extends to the L2 level. Conus and cauda equina appear normal. Paraspinal and other soft tissues: Negative. Disc levels: T11-T12: Tiny central disc protrusion.  No  stenosis. T12-L1:  Negative. L1-L2:  Circumferential disc osteophyte complex.  No stenosis. L2-L3: Circumferential disc osteophyte complex. Mild right lateral recess stenosis. Mild bilateral neuroforaminal stenosis. No spinal canal stenosis. L3-L4: Mild disc bulging with right-sided disc osteophyte complex. No stenosis. L4-L5: Circumferential disc bulging and endplate spurring. Superimposed small left subarticular disc protrusion. Moderate bilateral facet arthropathy. Mild to moderate left lateral recess stenosis. Mild bilateral neuroforaminal stenosis. No spinal canal stenosis. L5-S1: Left eccentric disc bulging. Moderate to severe bilateral facet arthropathy. Mild left greater than right lateral recess stenosis. Mild left neuroforaminal stenosis. No spinal canal or right neuroforaminal stenosis. IMPRESSION: 1. Moderate levoscoliosis and multilevel lumbar spondylosis as described above. Mild to moderate left lateral recess stenosis at L4-L5 could affect the descending left L5 nerve root. No high-grade spinal canal or neuroforaminal stenosis. Electronically Signed   By: Titus Dubin M.D.   On: 07/06/2021 15:24   DG Lumbar Spine 1 View  Result Date: 07/05/2021 CLINICAL DATA:  Weakness EXAM: LUMBAR SPINE - 1 VIEW COMPARISON:  None. FINDINGS: Leftward scoliosis centered in the mid lumbar spine. Evaluation is limited without lateral view. No visible fracture or focal bone lesion. Degenerative changes throughout the lumbar spine and in the hips bilaterally.  SI joints symmetric. IMPRESSION: Limited evaluation with only single AP view submitted. No visible acute bony abnormality. Advanced degenerative changes. Electronically Signed   By: Rolm Baptise M.D.   On: 07/05/2021 19:06   DG Chest Port 1 View  Result Date: 07/05/2021 CLINICAL DATA:  Weakness EXAM: PORTABLE CHEST 1 VIEW COMPARISON:  Chest radiograph dated December 02, 2018 FINDINGS: The heart size and mediastinal contours are within normal limits.  Atherosclerotic calcification of the aortic arch. Moderate hiatal hernia. Both lungs are clear. Left axillary surgical staples, unchanged. No acute osseous abnormality. IMPRESSION: No acute cardiopulmonary process. Electronically Signed   By: Keane Police D.O.   On: 07/05/2021 13:17        Scheduled Meds:  amLODipine  2.5 mg Oral Daily   aspirin  325 mg Oral Daily   docusate sodium  100 mg Oral BID   enoxaparin (LOVENOX) injection  30 mg Subcutaneous Q24H   ezetimibe  10 mg Oral Daily   metoprolol succinate  12.5 mg Oral Daily   pantoprazole  40 mg Oral Daily   Continuous Infusions:   LOS: 0 days    Time spent: over 30 min    Fayrene Helper, MD [facility]   To contact the attending provider between 7A-7P or the covering provider during after hours 7P-7A, please log into the web site [email or web address] and access using universal  password for that web site. If you do not have the password, please call the hospital operator.  07/06/2021, 3:32 PM

## 2021-07-06 NOTE — Assessment & Plan Note (Addendum)
Describes jerks and spasms of LLE Follow MRI as above, consider additional w/u CK wnl Appreciate neurology  Consider symptomatic treatment as needed outpatient

## 2021-07-06 NOTE — ED Notes (Signed)
Pt assisted to cammode

## 2021-07-07 ENCOUNTER — Inpatient Hospital Stay (HOSPITAL_COMMUNITY): Payer: Medicare Other

## 2021-07-07 DIAGNOSIS — Z20822 Contact with and (suspected) exposure to covid-19: Secondary | ICD-10-CM | POA: Diagnosis present

## 2021-07-07 DIAGNOSIS — I342 Nonrheumatic mitral (valve) stenosis: Secondary | ICD-10-CM | POA: Diagnosis not present

## 2021-07-07 DIAGNOSIS — Z9012 Acquired absence of left breast and nipple: Secondary | ICD-10-CM | POA: Diagnosis not present

## 2021-07-07 DIAGNOSIS — Z853 Personal history of malignant neoplasm of breast: Secondary | ICD-10-CM | POA: Diagnosis not present

## 2021-07-07 DIAGNOSIS — E1129 Type 2 diabetes mellitus with other diabetic kidney complication: Secondary | ICD-10-CM | POA: Diagnosis present

## 2021-07-07 DIAGNOSIS — G0402 Postimmunization acute disseminated encephalitis, myelitis and encephalomyelitis: Secondary | ICD-10-CM | POA: Diagnosis present

## 2021-07-07 DIAGNOSIS — Z66 Do not resuscitate: Secondary | ICD-10-CM | POA: Diagnosis present

## 2021-07-07 DIAGNOSIS — R29898 Other symptoms and signs involving the musculoskeletal system: Secondary | ICD-10-CM | POA: Diagnosis present

## 2021-07-07 DIAGNOSIS — M47816 Spondylosis without myelopathy or radiculopathy, lumbar region: Secondary | ICD-10-CM | POA: Diagnosis present

## 2021-07-07 DIAGNOSIS — I350 Nonrheumatic aortic (valve) stenosis: Secondary | ICD-10-CM | POA: Diagnosis not present

## 2021-07-07 DIAGNOSIS — R000 Tachycardia, unspecified: Secondary | ICD-10-CM | POA: Diagnosis present

## 2021-07-07 DIAGNOSIS — Z79899 Other long term (current) drug therapy: Secondary | ICD-10-CM | POA: Diagnosis not present

## 2021-07-07 DIAGNOSIS — R531 Weakness: Secondary | ICD-10-CM | POA: Diagnosis present

## 2021-07-07 DIAGNOSIS — Y929 Unspecified place or not applicable: Secondary | ICD-10-CM | POA: Diagnosis not present

## 2021-07-07 DIAGNOSIS — I10 Essential (primary) hypertension: Secondary | ICD-10-CM | POA: Diagnosis present

## 2021-07-07 DIAGNOSIS — Z8261 Family history of arthritis: Secondary | ICD-10-CM | POA: Diagnosis not present

## 2021-07-07 DIAGNOSIS — Z7982 Long term (current) use of aspirin: Secondary | ICD-10-CM | POA: Diagnosis not present

## 2021-07-07 DIAGNOSIS — E7800 Pure hypercholesterolemia, unspecified: Secondary | ICD-10-CM | POA: Diagnosis present

## 2021-07-07 DIAGNOSIS — M48061 Spinal stenosis, lumbar region without neurogenic claudication: Secondary | ICD-10-CM | POA: Diagnosis present

## 2021-07-07 DIAGNOSIS — F419 Anxiety disorder, unspecified: Secondary | ICD-10-CM | POA: Diagnosis present

## 2021-07-07 DIAGNOSIS — T50B95A Adverse effect of other viral vaccines, initial encounter: Secondary | ICD-10-CM | POA: Diagnosis present

## 2021-07-07 DIAGNOSIS — G9511 Acute infarction of spinal cord (embolic) (nonembolic): Secondary | ICD-10-CM | POA: Diagnosis present

## 2021-07-07 DIAGNOSIS — K219 Gastro-esophageal reflux disease without esophagitis: Secondary | ICD-10-CM | POA: Diagnosis present

## 2021-07-07 DIAGNOSIS — Z823 Family history of stroke: Secondary | ICD-10-CM | POA: Diagnosis not present

## 2021-07-07 DIAGNOSIS — M4802 Spinal stenosis, cervical region: Secondary | ICD-10-CM | POA: Diagnosis present

## 2021-07-07 DIAGNOSIS — M419 Scoliosis, unspecified: Secondary | ICD-10-CM | POA: Diagnosis present

## 2021-07-07 DIAGNOSIS — Z8249 Family history of ischemic heart disease and other diseases of the circulatory system: Secondary | ICD-10-CM | POA: Diagnosis not present

## 2021-07-07 DIAGNOSIS — Z90710 Acquired absence of both cervix and uterus: Secondary | ICD-10-CM | POA: Diagnosis not present

## 2021-07-07 LAB — CBC WITH DIFFERENTIAL/PLATELET
Abs Immature Granulocytes: 0.02 10*3/uL (ref 0.00–0.07)
Basophils Absolute: 0 10*3/uL (ref 0.0–0.1)
Basophils Relative: 0 %
Eosinophils Absolute: 0.3 10*3/uL (ref 0.0–0.5)
Eosinophils Relative: 3 %
HCT: 38.9 % (ref 36.0–46.0)
Hemoglobin: 12.9 g/dL (ref 12.0–15.0)
Immature Granulocytes: 0 %
Lymphocytes Relative: 27 %
Lymphs Abs: 2.3 10*3/uL (ref 0.7–4.0)
MCH: 30.3 pg (ref 26.0–34.0)
MCHC: 33.2 g/dL (ref 30.0–36.0)
MCV: 91.3 fL (ref 80.0–100.0)
Monocytes Absolute: 0.7 10*3/uL (ref 0.1–1.0)
Monocytes Relative: 8 %
Neutro Abs: 5.1 10*3/uL (ref 1.7–7.7)
Neutrophils Relative %: 62 %
Platelets: 218 10*3/uL (ref 150–400)
RBC: 4.26 MIL/uL (ref 3.87–5.11)
RDW: 13.4 % (ref 11.5–15.5)
WBC: 8.3 10*3/uL (ref 4.0–10.5)
nRBC: 0 % (ref 0.0–0.2)

## 2021-07-07 LAB — COMPREHENSIVE METABOLIC PANEL
ALT: 16 U/L (ref 0–44)
AST: 17 U/L (ref 15–41)
Albumin: 3.4 g/dL — ABNORMAL LOW (ref 3.5–5.0)
Alkaline Phosphatase: 40 U/L (ref 38–126)
Anion gap: 6 (ref 5–15)
BUN: 19 mg/dL (ref 8–23)
CO2: 27 mmol/L (ref 22–32)
Calcium: 8.7 mg/dL — ABNORMAL LOW (ref 8.9–10.3)
Chloride: 103 mmol/L (ref 98–111)
Creatinine, Ser: 0.93 mg/dL (ref 0.44–1.00)
GFR, Estimated: 58 mL/min — ABNORMAL LOW (ref >60)
Glucose, Bld: 106 mg/dL — ABNORMAL HIGH (ref 70–99)
Potassium: 4.3 mmol/L (ref 3.5–5.1)
Sodium: 136 mmol/L (ref 135–145)
Total Bilirubin: 0.3 mg/dL (ref 0.3–1.2)
Total Protein: 6.3 g/dL — ABNORMAL LOW (ref 6.5–8.1)

## 2021-07-07 LAB — PHOSPHORUS: Phosphorus: 3.6 mg/dL (ref 2.5–4.6)

## 2021-07-07 LAB — MAGNESIUM: Magnesium: 2.2 mg/dL (ref 1.7–2.4)

## 2021-07-07 MED ORDER — LORAZEPAM 2 MG/ML IJ SOLN
0.2500 mg | Freq: Once | INTRAMUSCULAR | Status: DC | PRN
Start: 2021-07-07 — End: 2021-07-07

## 2021-07-07 MED ORDER — LORAZEPAM 2 MG/ML IJ SOLN
0.5000 mg | Freq: Once | INTRAMUSCULAR | Status: AC | PRN
Start: 2021-07-07 — End: 2021-07-07
  Administered 2021-07-07: 0.5 mg via INTRAVENOUS
  Filled 2021-07-07: qty 1

## 2021-07-07 MED ORDER — GADOBUTROL 1 MMOL/ML IV SOLN
4.0000 mL | Freq: Once | INTRAVENOUS | Status: AC | PRN
  Administered 2021-07-07: 4 mL via INTRAVENOUS

## 2021-07-07 MED ORDER — TIZANIDINE HCL 2 MG PO TABS
1.0000 mg | ORAL_TABLET | Freq: Two times a day (BID) | ORAL | Status: DC | PRN
  Administered 2021-07-07 – 2021-07-12 (×11): 1 mg via ORAL
  Filled 2021-07-07 (×14): qty 0.5

## 2021-07-07 MED ORDER — SODIUM CHLORIDE 0.9 % IV SOLN
1000.0000 mg | INTRAVENOUS | Status: DC
  Filled 2021-07-07: qty 16

## 2021-07-07 NOTE — NC FL2 (Signed)
[place] MEDICAID FL2 LEVEL OF CARE SCREENING TOOL     IDENTIFICATION  Patient Name: [name] Birthdate: [birth year] Sex: female Admission Date (Current Location): 07/05/2021  [facility] and [place] Number:  Herbalist and Address:  [facility],  [place], [place]      Provider Number: [id number]  Attending Physician Name and Address:  Elodia Florence., *  Relative Name and Phone Number:       Current Level of Care: Hospital Recommended Level of Care: [place] Prior Approval Number:    Date Approved/Denied:   PASRR Number: [id number]  Discharge Plan: SNF    Current Diagnoses: Patient Active Problem List   Diagnosis Date Noted   COVID-19 vaccine administered 07/06/2021   Spasm 07/06/2021   Left leg weakness 07/05/2021   Small bowel obstruction ([place]) 12/02/2018   Malnutrition of moderate degree 12/25/2017   Diverticulitis of large intestine with abscess 12/19/2017   Sepsis ([place]) 12/19/2017   HTN (hypertension) 12/19/2017   History of prediabetes 12/19/2017   GERD (gastroesophageal reflux disease) 12/19/2017   Anxiety 12/19/2017   Acute renal failure superimposed on stage 2 chronic kidney disease ([place]) 12/19/2017   Type II diabetes mellitus with renal manifestations ([place]) 12/19/2017   Pancreatic cyst 12/19/2017   Hyperglycemia    Aortic systolic murmur on examination 04/08/2015   Malaise and fatigue 04/15/2012   Right carotid bruit 10/05/2011   Hiatal hernia 04/05/2011   Benign hypertensive heart disease without heart failure 09/01/2010   Hypercholesterolemia 09/01/2010   History of breast cancer 09/01/2010   Osteopenia 09/01/2010    Orientation RESPIRATION BLADDER Height & Weight     Self, Time, Situation, Place  Normal Continent Weight:   Height:  4\' 10"  (147.3 cm)  BEHAVIORAL SYMPTOMS/MOOD NEUROLOGICAL BOWEL NUTRITION STATUS      Continent Diet  AMBULATORY STATUS COMMUNICATION OF NEEDS Skin    Extensive Assist Verbally Normal                       Personal Care Assistance Level of Assistance  Bathing, Feeding, Dressing Bathing Assistance: Limited assistance Feeding assistance: Independent Dressing Assistance: Limited assistance     Functional Limitations Info  Sight, Hearing, Speech Sight Info: Impaired (glasses) Hearing Info: Adequate Speech Info: Adequate    SPECIAL CARE FACTORS FREQUENCY  PT (By licensed PT), OT (By licensed OT)     PT Frequency: 5 x weekly OT Frequency: 5 x weekly            Contractures Contractures Info: Not present    Additional Factors Info  Code Status Code Status Info: DNR             Current Medications (07/07/2021):  This is the current hospital active medication list Current Facility-Administered Medications  Medication Dose Route Frequency Provider Last Rate Last Admin   acetaminophen (TYLENOL) tablet 650 mg  650 mg Oral Q6H PRN Shawna Clamp, MD       Or   acetaminophen (TYLENOL) suppository 650 mg  650 mg Rectal Q6H PRN Shawna Clamp, MD       ALPRAZolam Duanne Moron) tablet 0.25 mg  0.25 mg Oral QHS PRN Shawna Clamp, MD   0.25 mg at 07/06/21 2138   amLODipine (NORVASC) tablet 2.5 mg  2.5 mg Oral Daily Shawna Clamp, MD   2.5 mg at 07/06/21 1128   aspirin EC tablet 325 mg  325 mg Oral Daily Shawna Clamp, MD   325  mg at 07/06/21 1128   docusate sodium (COLACE) capsule 100 mg  100 mg Oral BID Shawna Clamp, MD   100 mg at 07/06/21 2051   enoxaparin (LOVENOX) injection 30 mg  30 mg Subcutaneous Q24H Shawna Clamp, MD   30 mg at 07/06/21 1930   ezetimibe (ZETIA) tablet 10 mg  10 mg Oral Daily Shawna Clamp, MD   10 mg at 07/06/21 1128   metoprolol succinate (TOPROL-XL) 24 hr tablet 12.5 mg  12.5 mg Oral Daily Shawna Clamp, MD   12.5 mg at 07/06/21 1128   ondansetron (ZOFRAN) tablet 4 mg  4 mg Oral Q6H PRN Shawna Clamp, MD       Or   ondansetron [facility]) injection 4 mg  4 mg Intravenous Q6H PRN Shawna Clamp,  MD       pantoprazole (PROTONIX) EC tablet 40 mg  40 mg Oral Daily Shawna Clamp, MD   40 mg at 07/06/21 1128     Discharge Medications: Please see discharge summary for a list of discharge medications.  Relevant Imaging Results:  Relevant Lab Results:   Additional Information SSN: [id number]  Leeroy Cha, RN

## 2021-07-07 NOTE — Plan of Care (Signed)
MRI of cervical and thoracic spine has been reviewed. The T6 level anterior cord lesion, although nonenhancing, would intersect the motor tracts and there is more of a left sided component of the inflammatory cord signal, which matches the side on which the lower extremity weakness was worst.   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 A spinal cord infarct is felt to be unlikely based on the small size of the cord lesion, which is not longitudinally extensive, although it is present anteriorly.   A/R: - Recommending a 5-day course of IV Solumedrol 1000 mg qd. Monitor CBG, CBC and electrolytes while on pulsed-dose steroids.  - She should avoid future Covid booster vaccinations as the overall risk of neurological morbidity/mortality should she have a recurrent event of transverse myelitis is felt to significantly outweigh the added protective effect of a subsequent vaccine booster on overall viral infection related morbidity/mortality in this patient.  Electronically signed: Dr. Kerney Elbe

## 2021-07-07 NOTE — TOC Progression Note (Addendum)
Transition of Care [facility]) - Progression Note    Patient Details  Name: [name] MRN: [id number] Date of Birth: 1928/11/11  [facility] of [facility] CM/SW Contact  Leeroy Cha, RN Phone Number: 07/07/2021, 9:53 AM  Clinical Narrative:    Patient will need snf placement.  Spoke with patient and she does not have preference as to which snf rehab to go to.   Will send out the fl2 and review offers with her. 4 snfs have made bed offers.  [facility] and [facility].  [facility] is the closest to home address.  Will review with patient  Expected Discharge Plan: Home/Self Care Barriers to Discharge: Continued Medical Work up  Expected Discharge Plan and Services Expected Discharge Plan: Home/Self Care   Discharge Planning Services: CM Consult   Living arrangements for the past 2 months: Single Family Home                                       Social Determinants of Health (SDOH) Interventions    Readmission Risk Interventions No flowsheet data found.

## 2021-07-07 NOTE — Progress Notes (Signed)
PROGRESS NOTE    [name]  UXL:[id number] DOB: [birth year] DOA: 07/05/2021 PCP: Crist Infante, MD  Chief Complaint  Patient presents with   Weakness    Brief Narrative:  88 yo with hx breast cancer, HLD, HTN, diet controlled diabetes who presented on 2/7 with generalized weakness and progressive difficulty walking.  She noted progressive LLE weakness and presented to the ED.  She's been admitted for additional workup of her generalized and left lower extremity weakness.    Assessment & Plan:   Principal Problem:   Left leg weakness Active Problems:   Spasm   HTN (hypertension)   Type II diabetes mellitus with renal manifestations (HCC)   Hypercholesterolemia   Anxiety   COVID-19 vaccine administered   History of breast cancer   GERD (gastroesophageal reflux disease)   Assessment and Plan: * Left leg weakness- (present on admission) New LLE weakness Previously independent Negative MRI brain MRI lumbar spine with moderate levoscoliosis and multilevel lumbar spondylosis as described above, mild to moderate left lateral recess stenosis at L4-L5 could affect the descending L L5 nerve root Appreciate neurology assistance -> recommending MRI cervical and thoracic spine with and without contrast   Spasm Describes jerks and spasms of LLE Follow MRI as above, consider additional w/u CK, consider EEG  Type II diabetes mellitus with renal manifestations ([place])- (present on admission) Follow BG's Diet controlled Follow A1c   HTN (hypertension)- (present on admission) Metoprolol, amlodipine  Anxiety- (present on admission) xanax  Hypercholesterolemia- (present on admission) zetia  COVID-19 vaccine administered S/ p booster on 2/4    DVT prophylaxis: lovenox Code Status: DNR Family Communication: none Disposition:   Status is: Observation The patient will require care spanning > 2 midnights and should be moved to inpatient because: continued LLE weakness requiring  neurology c/s    Consultants:  neurology  Procedures:  none  Antimicrobials:  Anti-infectives (From admission, onward)    None      Subjective: No new complaints  Objective: Vitals:   07/06/21 1343 07/07/21 0347 07/07/21 0847 07/07/21 1300  BP: 136/63 (!) 141/73 (!) 115/58 (!) 145/73  Pulse: 88 85 (!) 102 88  Resp: 17 18 15 17   Temp: 98.1 F (36.7 C) (!) 97.4 F (36.3 C) 97.7 F (36.5 C) 98.1 F (36.7 C)  TempSrc: Oral Oral Oral Oral  SpO2: 97% 93% 97% 98%  Height:        Intake/Output Summary (Last 24 hours) at 07/07/2021 1424 Last data filed at 07/07/2021 1416 Gross per 24 hour  Intake 960 ml  Output 200 ml  Net 760 ml   There were no vitals filed for this visit.  Examination:  General: No acute distress. Cardiovascular: RRR Lungs: unlabored Abdomen: Soft, nontender, nondistended Neurological: Alert and oriented 3. Cranial nerves II through XII intact.  LLE weakness, more notable proximally, seems maybe slightly improved from yesterday.  Upgoing toes, worse on L. Skin: Warm and dry. No rashes or lesions. Extremities: No clubbing or cyanosis. No edema.   Data Reviewed: I have personally reviewed following labs and imaging studies  CBC: Recent Labs  Lab 07/05/21 1134 07/05/21 1910 07/06/21 0350 07/07/21 0502  WBC 11.9* 11.0* 10.5 8.3  NEUTROABS  --   --   --  5.1  HGB 13.0 12.4 12.7 12.9  HCT 39.6 38.0 39.1 38.9  MCV 90.6 90.5 92.7 91.3  PLT 228 235 214 536    Basic Metabolic Panel: Recent Labs  Lab 07/05/21 1134 07/05/21 1910 07/06/21 0350  07/07/21 0502  NA 136  --  135 136  K 4.1  --  3.2* 4.3  CL 103  --  101 103  CO2 25  --  23 27  GLUCOSE 202*  --  236* 106*  BUN 18  --  16 19  CREATININE 0.78 0.69 0.94 0.93  CALCIUM 9.2  --  8.8* 8.7*  MG  --   --  2.1 2.2  PHOS  --   --  3.3 3.6    GFR: CrCl cannot be calculated (Unknown ideal weight.).  Liver Function Tests: Recent Labs  Lab 07/05/21 1134 07/06/21 0350  07/07/21 0502  AST 20 22 17   ALT 19 17 16   ALKPHOS 46 42 40  BILITOT 0.1* 0.5 0.3  PROT 7.1 6.1* 6.3*  ALBUMIN 4.0 3.5 3.4*    CBG: No results for input(s): GLUCAP in the last 168 hours.   Recent Results (from the past 240 hour(s))  Resp Panel by RT-PCR (Flu Joshual Terrio&B, Covid) Nasopharyngeal Swab     Status: None   Collection Time: 07/05/21 11:34 AM   Specimen: Nasopharyngeal Swab; Nasopharyngeal(NP) swabs in vial transport medium  Result Value Ref Range Status   SARS Coronavirus 2 by RT PCR NEGATIVE NEGATIVE Final    Comment: (NOTE) SARS-CoV-2 target nucleic acids are NOT DETECTED.  The SARS-CoV-2 RNA is generally detectable in upper respiratory specimens during the acute phase of infection. The lowest concentration of SARS-CoV-2 viral copies this assay can detect is 138 copies/mL. Teodor Prater negative result does not preclude SARS-Cov-2 infection and should not be used as the sole basis for treatment or other patient management decisions. Sharell Hilmer negative result may occur with  improper specimen collection/handling, submission of specimen other than nasopharyngeal swab, presence of viral mutation(s) within the areas targeted by this assay, and inadequate number of viral copies(<138 copies/mL). Erika Slaby negative result must be combined with clinical observations, patient history, and epidemiological information. The expected result is Negative.  Fact Sheet for Patients:  EntrepreneurPulse.com.au  Fact Sheet for Healthcare Providers:  IncredibleEmployment.be  This test is no t yet approved or cleared by the [place] FDA and  has been authorized for detection and/or diagnosis of SARS-CoV-2 by FDA under an Emergency Use Authorization (EUA). This EUA will remain  in effect (meaning this test can be used) for the duration of the COVID-19 declaration under Section 564(b)(1) of the Act, 21 U.S.C.section 360bbb-3(b)(1), unless the authorization is terminated  or  revoked sooner.       Influenza Candice Tobey by PCR NEGATIVE NEGATIVE Final   Influenza B by PCR NEGATIVE NEGATIVE Final    Comment: (NOTE) The Xpert Xpress SARS-CoV-2/FLU/RSV plus assay is intended as an aid in the diagnosis of influenza from Nasopharyngeal swab specimens and should not be used as Fina Heizer sole basis for treatment. Nasal washings and aspirates are unacceptable for Xpert Xpress SARS-CoV-2/FLU/RSV testing.  Fact Sheet for Patients: EntrepreneurPulse.com.au  Fact Sheet for Healthcare Providers: IncredibleEmployment.be  This test is not yet approved or cleared by the [place] FDA and has been authorized for detection and/or diagnosis of SARS-CoV-2 by FDA under an Emergency Use Authorization (EUA). This EUA will remain in effect (meaning this test can be used) for the duration of the COVID-19 declaration under Section 564(b)(1) of the Act, 21 U.S.C. section 360bbb-3(b)(1), unless the authorization is terminated or revoked.  Performed at [facility], [place] [address], [place], [place] [address]          Radiology Studies: DG Sacrum/Coccyx  Result Date: 07/05/2021 CLINICAL DATA:  Weakness EXAM: SACRUM AND COCCYX - 2+ VIEW COMPARISON:  None. FINDINGS: Degenerative changes in the visualized lower lumbar spine and hips bilaterally. No visible sacral or coccygeal fracture. SI joints symmetric. IMPRESSION: No acute bony abnormality. Electronically Signed   By: Rolm Baptise M.D.   On: 07/05/2021 19:06   MR BRAIN WO CONTRAST  Result Date: 07/05/2021 CLINICAL DATA:  TIA.  Acute neuro deficit. EXAM: MRI HEAD WITHOUT CONTRAST TECHNIQUE: Multiplanar, multiecho pulse sequences of the brain and surrounding structures were obtained without intravenous contrast. COMPARISON:  CT head 07/05/2021 FINDINGS: Brain: Generalized atrophy, typical for age. Negative for hydrocephalus. Moderate white matter hyperintensities bilaterally. Mild  hyperintensity in the pons. Negative for acute infarct, hemorrhage, mass. Vascular: Negative for hyperdense vessel Skull and upper cervical spine: No acute skeletal abnormality. C1-2 degenerative change with prominent pannus. No significant stenosis. Sinuses/Orbits: Paranasal sinuses clear. Bilateral cataract extraction Other: None IMPRESSION: No acute abnormality Atrophy and moderate chronic microvascular ischemia Electronically Signed   By: Franchot Gallo M.D.   On: 07/05/2021 19:12   MR LUMBAR SPINE WO CONTRAST  Result Date: 07/06/2021 CLINICAL DATA:  Progressive left leg weakness. Remote history of breast cancer. EXAM: MRI LUMBAR SPINE WITHOUT CONTRAST TECHNIQUE: Multiplanar, multisequence MR imaging of the lumbar spine was performed. No intravenous contrast was administered. COMPARISON:  Lumbar spine x-rays from yesterday. CT abdomen pelvis dated December 02, 2018. FINDINGS: Segmentation:  Standard. Alignment:  Levoscoliosis.  Trace anterolisthesis at L5-S1. Vertebrae:  No fracture, evidence of discitis, or bone lesion. Conus medullaris and cauda equina: Conus extends to the L2 level. Conus and cauda equina appear normal. Paraspinal and other soft tissues: Negative. Disc levels: T11-T12: Tiny central disc protrusion.  No stenosis. T12-L1:  Negative. L1-L2:  Circumferential disc osteophyte complex.  No stenosis. L2-L3: Circumferential disc osteophyte complex. Mild right lateral recess stenosis. Mild bilateral neuroforaminal stenosis. No spinal canal stenosis. L3-L4: Mild disc bulging with right-sided disc osteophyte complex. No stenosis. L4-L5: Circumferential disc bulging and endplate spurring. Superimposed small left subarticular disc protrusion. Moderate bilateral facet arthropathy. Mild to moderate left lateral recess stenosis. Mild bilateral neuroforaminal stenosis. No spinal canal stenosis. L5-S1: Left eccentric disc bulging. Moderate to severe bilateral facet arthropathy. Mild left greater than right  lateral recess stenosis. Mild left neuroforaminal stenosis. No spinal canal or right neuroforaminal stenosis. IMPRESSION: 1. Moderate levoscoliosis and multilevel lumbar spondylosis as described above. Mild to moderate left lateral recess stenosis at L4-L5 could affect the descending left L5 nerve root. No high-grade spinal canal or neuroforaminal stenosis. Electronically Signed   By: Titus Dubin M.D.   On: 07/06/2021 15:24   DG Lumbar Spine 1 View  Result Date: 07/05/2021 CLINICAL DATA:  Weakness EXAM: LUMBAR SPINE - 1 VIEW COMPARISON:  None. FINDINGS: Leftward scoliosis centered in the mid lumbar spine. Evaluation is limited without lateral view. No visible fracture or focal bone lesion. Degenerative changes throughout the lumbar spine and in the hips bilaterally. SI joints symmetric. IMPRESSION: Limited evaluation with only single AP view submitted. No visible acute bony abnormality. Advanced degenerative changes. Electronically Signed   By: Rolm Baptise M.D.   On: 07/05/2021 19:06        Scheduled Meds:  amLODipine  2.5 mg Oral Daily   aspirin  325 mg Oral Daily   docusate sodium  100 mg Oral BID   enoxaparin (LOVENOX) injection  30 mg Subcutaneous Q24H   ezetimibe  10 mg Oral Daily   metoprolol succinate  12.5 mg  Oral Daily   pantoprazole  40 mg Oral Daily   Continuous Infusions:   LOS: 0 days    Time spent: over 30 min    Fayrene Helper, MD [facility]   To contact the attending provider between 7A-7P or the covering provider during after hours 7P-7A, please log into the web site [email or web address] and access using universal [place] password for that web site. If you do not have the password, please call the hospital operator.  07/07/2021, 2:24 PM

## 2021-07-07 NOTE — Consult Note (Signed)
Neurology Consultation  Reason for Consult: Left lower extremity weakness Referring Physician: Dr. Florene Glen  CC: Left lower extremity weakness  History is obtained from: Patient, Chart review   HPI: [name] is a 88 y.o. female with a medical history significant for breast cancer, hypercholesteremia, essential hypertension, and osteoporosis who presented to [facility] on 2/7 for evaluation of acute onset of left lower extremity weakness when she woke up on Tuesday morning 2/7. Ms. Sexson states that she received a COVID booster vaccine on 2/4 and was in her usual state of health. On Monday 2/6, she states she had upper abdominal pain that she attributed to arthritis and took a few doses of Advil with improvement. On Monday night she states that she had left lower extremity spasms and could not get comfortable but states that her left leg was not cramping, it was just moving constantly. On Tuesday morning when she woke, she noted that her left hip was weak and she felt unsteady with standing prompting her to be evaluated in the ED. She states the weakness has been constant and felt that it had progressed but with some improvement today after getting out of bed multiple times yesterday. She denies any recent falls, trauma, or illness.   At baseline, she lives independently and ambulates without assistive device. She does share concern for losing her independence due to left lower extremity weakness.   ROS: A complete ROS was performed and is negative except as noted in the HPI.  Past Medical History:  Diagnosis Date   Breast cancer ([place])    Diverticulosis    Hiatal hernia    Hypercholesteremia    Hypertension    Osteopenia    Osteoporosis    Past Surgical History:  Procedure Laterality Date   ABDOMINAL HYSTERECTOMY     IR RADIOLOGIST EVAL & MGMT  01/03/2018   IR RADIOLOGIST EVAL & MGMT  01/31/2018   IR RADIOLOGIST EVAL & MGMT  02/27/2018   IR RADIOLOGIST EVAL & MGMT  03/20/2018   IR RADIOLOGIST  EVAL & MGMT  04/03/2018   MASTECTOMY  1999   LEFT   Family History  Problem Relation Age of Onset   Arthritis Father    Stroke Father    Hypertension Mother    Social History:   reports that she has never smoked. She has never used smokeless tobacco. No history on file for alcohol use and drug use.  Medications  Current Facility-Administered Medications:    acetaminophen (TYLENOL) tablet 650 mg, 650 mg, Oral, Q6H PRN **OR** acetaminophen (TYLENOL) suppository 650 mg, 650 mg, Rectal, Q6H PRN, Shawna Clamp, MD   ALPRAZolam Duanne Moron) tablet 0.25 mg, 0.25 mg, Oral, QHS PRN, Shawna Clamp, MD, 0.25 mg at 07/06/21 2138   amLODipine (NORVASC) tablet 2.5 mg, 2.5 mg, Oral, Daily, Shawna Clamp, MD, 2.5 mg at 07/06/21 1128   aspirin EC tablet 325 mg, 325 mg, Oral, Daily, Shawna Clamp, MD, 325 mg at 07/06/21 1128   docusate sodium (COLACE) capsule 100 mg, 100 mg, Oral, BID, Shawna Clamp, MD, 100 mg at 07/06/21 2051   enoxaparin (LOVENOX) injection 30 mg, 30 mg, Subcutaneous, Q24H, Shawna Clamp, MD, 30 mg at 07/06/21 1930   ezetimibe (ZETIA) tablet 10 mg, 10 mg, Oral, Daily, Shawna Clamp, MD, 10 mg at 07/06/21 1128   metoprolol succinate (TOPROL-XL) 24 hr tablet 12.5 mg, 12.5 mg, Oral, Daily, Shawna Clamp, MD, 12.5 mg at 07/06/21 1128   ondansetron (ZOFRAN) tablet 4 mg, 4 mg, Oral, Q6H PRN **OR**  ondansetron (ZOFRAN) injection 4 mg, 4 mg, Intravenous, Q6H PRN, Shawna Clamp, MD   pantoprazole (PROTONIX) EC tablet 40 mg, 40 mg, Oral, Daily, Shawna Clamp, MD, 40 mg at 07/06/21 1128  Exam: Current vital signs: BP (!) 141/73 (BP Location: Right Arm)    Pulse 85    Temp (!) 97.4 F (36.3 C) (Oral)    Resp 18    Ht 4\' 10"  (1.473 m)    SpO2 93%    BMI 18.81 kg/m  Vital signs in last 24 hours: Temp:  [97.4 F (36.3 C)-98.1 F (36.7 C)] 97.4 F (36.3 C) (02/09 0347) Pulse Rate:  [85-88] 85 (02/09 0347) Resp:  [17-18] 18 (02/09 0347) BP: (136-141)/(63-73) 141/73 (02/09 0347) SpO2:   [93 %-97 %] 93 % (02/09 0347)  GENERAL: Awake, alert, in no acute distress Psych: Affect appropriate for situation, patient is calm and cooperative with examination Head: Normocephalic and atraumatic, without obvious abnormality EENT: Normal conjunctivae, dry mucous membranes, no OP obstruction, slightly hard of hearing  LUNGS: Normal respiratory effort. Non-labored breathing on room air CV: Regular rate and rhythm on telemetry ABDOMEN: Soft, non-tender, non-distended Extremities: Warm, well perfused, without obvious deformity  NEURO:  Mental Status: Awake, alert, and oriented to person, place, time, and situation. She is able to provide a clear and coherent history of present illness. Speech/Language: speech is intact without dysarthria.   Naming, repetition, fluency, and comprehension intact without aphasia. No neglect is noted Cranial Nerves:  II: PERRL 3 mm/brisk. Visual fields full.  III, IV, VI: EOMI without ptosis, nystagmus, or gaze preference.  V: Sensation is intact to light touch and symmetrical to face.  VII: Face is symmetric resting and smiling.  VIII: Hearing intact to voice, slightly hard of hearing during examination IX, X: Palate elevation is symmetric. Phonation normal.  XI: Normal sternocleidomastoid and trapezius muscle strength XII: Tongue protrudes midline without fasciculations.   Motor: 5/5 strength present in bilateral upper extremities and right lower extremity.  Patient does have left lower extremity weakness proximal > distal. Right hip 2/5, right knee flexion 3/5, right knee extension 4-/5, ankle dorsiflexion 4-/5, plantarflexion 4/5 Tone is normal. Bulk is normal.  Sensation: Intact to light touch bilaterally in all four extremities. No extinction to DSS present.  DTRs: 3+ and symmetric patellae, 2+ and symmetric biceps   Gait: Deferred  Labs I have reviewed labs in epic and the results pertinent to this consultation are: CBC    Component Value  Date/Time   WBC 8.3 07/07/2021 0502   RBC 4.26 07/07/2021 0502   HGB 12.9 07/07/2021 0502   HCT 38.9 07/07/2021 0502   PLT 218 07/07/2021 0502   MCV 91.3 07/07/2021 0502   MCH 30.3 07/07/2021 0502   MCHC 33.2 07/07/2021 0502   RDW 13.4 07/07/2021 0502   LYMPHSABS 2.3 07/07/2021 0502   MONOABS 0.7 07/07/2021 0502   EOSABS 0.3 07/07/2021 0502   BASOSABS 0.0 07/07/2021 0502   CMP     Component Value Date/Time   NA 136 07/07/2021 0502   NA 141 10/17/2016 0945   K 4.3 07/07/2021 0502   CL 103 07/07/2021 0502   CO2 27 07/07/2021 0502   GLUCOSE 106 (H) 07/07/2021 0502   BUN 19 07/07/2021 0502   BUN 14 10/17/2016 0945   CREATININE 0.93 07/07/2021 0502   CREATININE 0.88 04/10/2016 0953   CALCIUM 8.7 (L) 07/07/2021 0502   PROT 6.3 (L) 07/07/2021 0502   PROT 6.4 10/17/2016 0945   ALBUMIN 3.4 (L)  07/07/2021 0502   ALBUMIN 4.1 10/17/2016 0945   AST 17 07/07/2021 0502   ALT 16 07/07/2021 0502   ALKPHOS 40 07/07/2021 0502   BILITOT 0.3 07/07/2021 0502   BILITOT 0.3 10/17/2016 0945   GFRNONAA 58 (L) 07/07/2021 0502   GFRAA >60 12/06/2018 0303   Lipid Panel     Component Value Date/Time   CHOL 150 10/17/2016 0945   TRIG 205 (H) 10/17/2016 0945   HDL 44 10/17/2016 0945   CHOLHDL 3.4 10/17/2016 0945   CHOLHDL 3.3 04/10/2016 0953   VLDL 53 (H) 04/10/2016 0953   LDLCALC 65 10/17/2016 0945   LDLDIRECT 102.8 03/31/2014 0922   Imaging I have reviewed the images obtained:  MRI brain wo contrast 2/8: No acute abnormality Atrophy and moderate chronic microvascular ischemia  MRI lumbar spine 2/8: 1. Moderate levoscoliosis and multilevel lumbar spondylosis as described above. Mild to moderate left lateral recess stenosis at L4-L5 could affect the descending left L5 nerve root. No high-grade spinal canal or neuroforaminal stenosis.  Assessment: 88 y.o. female with recent COVID vaccination 2/4 who presented to the ED 2/7 for evaluation of acute onset of proximal > distal left lower  extremity weakness following spasticity overnight on 2/6. - Examination reveals patient with proximal > distal weakness of the left lower extremity without sensory deficits.  - MRI brain is without acute abnormality. MRI lumbar spine as above. - It is plausible that lumbar spine findings of left L5 nerve root may represent nerve compression leading to complications with the left lower extremity, however, in the absence of severe stenosis, believe this may be lower on the differential diagnosis list. Will obtain further spinal imaging for further evaluation.   Impression: Acute left lower extremity weakness  Recommendations: - MRI cervical and thoracic spine with and without contrast - PT/OT evaluation  - Neurology will follow MRI imaging   Pt seen by NP/Neuro and later by MD. Note/plan to be edited by MD as needed.  Anibal Henderson, AGAC-NP [facility] Pager: [phone]  I have seen the patient and reviewed the above note.  She does have a nonenhancing lesion in her thoracic cord.  Given the relatively abrupt onset following adjuvant (COVID-vaccine) there is a significant possibility of an inflammatory nature to this.  I think at this point consideration could be given to empiric steroid therapy, and I would favor continuing to assess for inflammation with lumbar puncture.  1) LP for cells, glucose, protein, oligoclonal bands, IgG index 2) could consider empiric steroids 3) PT/OT 4) neurology will follow  Roland Rack, MD [facility] [phone]  If 7pm- 7am, please page neurology on call as listed in [place].

## 2021-07-08 ENCOUNTER — Inpatient Hospital Stay (HOSPITAL_COMMUNITY): Payer: Medicare Other

## 2021-07-08 LAB — CSF CELL COUNT WITH DIFFERENTIAL
Lymphs, CSF: 6 % — ABNORMAL LOW (ref 40–80)
RBC Count, CSF: 2230 /mm3 — ABNORMAL HIGH
Segmented Neutrophils-CSF: 94 % — ABNORMAL HIGH (ref 0–6)
Tube #: 4
WBC, CSF: 2 /mm3 (ref 0–5)

## 2021-07-08 LAB — CBC WITH DIFFERENTIAL/PLATELET
Abs Immature Granulocytes: 0.03 10*3/uL (ref 0.00–0.07)
Basophils Absolute: 0 10*3/uL (ref 0.0–0.1)
Basophils Relative: 0 %
Eosinophils Absolute: 0.3 10*3/uL (ref 0.0–0.5)
Eosinophils Relative: 3 %
HCT: 41.2 % (ref 36.0–46.0)
Hemoglobin: 13.4 g/dL (ref 12.0–15.0)
Immature Granulocytes: 0 %
Lymphocytes Relative: 24 %
Lymphs Abs: 2.3 10*3/uL (ref 0.7–4.0)
MCH: 29.6 pg (ref 26.0–34.0)
MCHC: 32.5 g/dL (ref 30.0–36.0)
MCV: 91.2 fL (ref 80.0–100.0)
Monocytes Absolute: 0.8 10*3/uL (ref 0.1–1.0)
Monocytes Relative: 9 %
Neutro Abs: 6.1 10*3/uL (ref 1.7–7.7)
Neutrophils Relative %: 64 %
Platelets: 172 10*3/uL (ref 150–400)
RBC: 4.52 MIL/uL (ref 3.87–5.11)
RDW: 13.2 % (ref 11.5–15.5)
Smear Review: NORMAL
WBC: 9.5 10*3/uL (ref 4.0–10.5)
nRBC: 0 % (ref 0.0–0.2)

## 2021-07-08 LAB — GLUCOSE, CSF: Glucose, CSF: 85 mg/dL — ABNORMAL HIGH (ref 40–70)

## 2021-07-08 LAB — COMPREHENSIVE METABOLIC PANEL
ALT: 17 U/L (ref 0–44)
AST: 18 U/L (ref 15–41)
Albumin: 3.5 g/dL (ref 3.5–5.0)
Alkaline Phosphatase: 38 U/L (ref 38–126)
Anion gap: 8 (ref 5–15)
BUN: 22 mg/dL (ref 8–23)
CO2: 24 mmol/L (ref 22–32)
Calcium: 8.9 mg/dL (ref 8.9–10.3)
Chloride: 103 mmol/L (ref 98–111)
Creatinine, Ser: 0.67 mg/dL (ref 0.44–1.00)
GFR, Estimated: >60 mL/min (ref >60)
Glucose, Bld: 104 mg/dL — ABNORMAL HIGH (ref 70–99)
Potassium: 3.7 mmol/L (ref 3.5–5.1)
Sodium: 135 mmol/L (ref 135–145)
Total Bilirubin: 0.3 mg/dL (ref 0.3–1.2)
Total Protein: 6.1 g/dL — ABNORMAL LOW (ref 6.5–8.1)

## 2021-07-08 LAB — PROTEIN, CSF: Total  Protein, CSF: 46 mg/dL — ABNORMAL HIGH (ref 15–45)

## 2021-07-08 LAB — MAGNESIUM: Magnesium: 2.2 mg/dL (ref 1.7–2.4)

## 2021-07-08 LAB — PHOSPHORUS: Phosphorus: 3.8 mg/dL (ref 2.5–4.6)

## 2021-07-08 MED ORDER — LORAZEPAM 2 MG/ML IJ SOLN
0.5000 mg | Freq: Once | INTRAMUSCULAR | Status: AC | PRN
Start: 2021-07-08 — End: 2021-07-09
  Administered 2021-07-09: 0.5 mg via INTRAVENOUS
  Filled 2021-07-08: qty 1

## 2021-07-08 MED ORDER — SODIUM CHLORIDE 0.9 % IV SOLN
500.0000 mg | Freq: Every day | INTRAVENOUS | Status: AC
  Administered 2021-07-08 – 2021-07-12 (×5): 500 mg via INTRAVENOUS
  Filled 2021-07-08: qty 8
  Filled 2021-07-08: qty 4
  Filled 2021-07-08 (×2): qty 8
  Filled 2021-07-08: qty 4

## 2021-07-08 MED ORDER — SODIUM CHLORIDE 0.9 % IV SOLN: 500.0000 mg | INTRAVENOUS | Status: DC

## 2021-07-08 MED ORDER — SODIUM CHLORIDE 0.9 % IV SOLN
1000.0000 mg | INTRAVENOUS | Status: DC
  Filled 2021-07-08: qty 16

## 2021-07-08 NOTE — TOC Progression Note (Signed)
Transition of Care [facility]) - Progression Note    Patient Details  Name: [name] MRN: [id number] Date of Birth: 1929/02/05  Transition of Care [facility] CM/SW Contact  Leeroy Cha, RN Phone Number: 07/08/2021, 10:32 AM  Clinical Narrative:    Expected discharge date is [phone].  Attempted to start [employer] auth =too far out can not do . Her insurance id number is [id number]    Expected Discharge Plan: Home/Self Care Barriers to Discharge: Continued Medical Work up  Expected Discharge Plan and Services Expected Discharge Plan: Home/Self Care   Discharge Planning Services: CM Consult   Living arrangements for the past 2 months: Single Family Home                                       Social Determinants of Health (SDOH) Interventions    Readmission Risk Interventions No flowsheet data found.

## 2021-07-08 NOTE — Progress Notes (Signed)
Occupational Therapy Treatment Patient Details Name: [name] MRN: [id number] DOB: [birth year] Today's Date: 07/08/2021   History of present illness 88 yo female admitted with L LE weakness. Negative MRI brain; MRI lumbar spine with moderate levoscoliosis and multilevel lumbar spondylosis, mild to moderate left lateral recess stenosis at L4-L5 could affect the descending L L5 nerve root. Hx of fibromyalgia, osteoporosis, breast cancer s/p mastectomy, COVID, osteopenia.   OT comments  Short OT session as transport arrived to take patient for procedure. Patient min G assist for standing at sink side for oral care due to intermittent spasm in L LE and difficulty maintaining upright posture. Patient leans onto sink at times for stability. Initially transferred back to recliner, however needed to be in bed for procedure. Patient min A to lift legs onto bed.    Recommendations for follow up therapy are one component of a multi-disciplinary discharge planning process, led by the attending physician.  Recommendations may be updated based on patient status, additional functional criteria and insurance authorization.    Follow Up Recommendations  Skilled nursing-short term rehab (<3 hours/day)    Assistance Recommended at Discharge Frequent or constant Supervision/Assistance  Patient can return home with the following  A little help with walking and/or transfers;A little help with bathing/dressing/bathroom   Equipment Recommendations  Other (comment) (defer next venue)       Precautions / Restrictions Precautions Precautions: Fall Restrictions Weight Bearing Restrictions: No       Mobility Bed Mobility Overal bed mobility: Needs Assistance Bed Mobility: Sit to Supine       Sit to supine: Min assist   General bed mobility comments: To lift legs onto bed       Balance Overall balance assessment: Needs assistance Sitting-balance support: No upper extremity supported Sitting  balance-Leahy Scale: Good     Standing balance support: Single extremity supported, Bilateral upper extremity supported, Reliant on assistive device for balance Standing balance-Leahy Scale: Poor                             ADL either performed or assessed with clinical judgement   ADL Overall ADL's : Needs assistance/impaired     Grooming: Oral care;Min guard;Standing Grooming Details (indicate cue type and reason): Patient with intermittent spasm in L LE at times difficulty maintaining upright posture leaning onto sink. Min G for safety.                 Toilet Transfer: Agricultural engineer (2 wheels) Toilet Transfer Details (indicate cue type and reason): First patient in recliner after oral care, then transferred to bed for procedure. Able to power up to standing herself, min G for safety/balance due to intermittent spasm and fatigue         Functional mobility during ADLs: Rolling walker (2 wheels);Min guard        Cognition Arousal/Alertness: Awake/alert Behavior During Therapy: WFL for tasks assessed/performed Overall Cognitive Status: Within Functional Limits for tasks assessed                                                     Pertinent Vitals/ Pain       Pain Assessment Pain Assessment: Faces Faces Pain Scale: Hurts a little bit Pain Location: L LE Pain Descriptors / Indicators: Spasm  Pain Intervention(s): Monitored during session         Frequency  Min 2X/week        Progress Toward Goals  OT Goals(current goals can now be found in the care plan section)  Progress towards OT goals: Progressing toward goals  Acute Rehab OT Goals Patient Stated Goal: Brush teeth OT Goal Formulation: With patient Time For Goal Achievement: 07/20/21 Potential to Achieve Goals: Good ADL Goals Pt Will Perform Lower Body Dressing: sit to/from stand;with supervision Pt Will Transfer to Toilet: with  supervision;ambulating;regular height toilet;grab bars Pt Will Perform Toileting - Clothing Manipulation and hygiene: with supervision;sit to/from stand Additional ADL Goal #1: Patient will stand at sink to perform grooming task as evidence of improving activity tolerance and balance  Plan Discharge plan remains appropriate       AM-PAC OT "6 Clicks" Daily Activity     Outcome Measure   Help from another person eating meals?: None Help from another person taking care of personal grooming?: A Little Help from another person toileting, which includes using toliet, bedpan, or urinal?: A Lot Help from another person bathing (including washing, rinsing, drying)?: A Little Help from another person to put on and taking off regular upper body clothing?: A Little Help from another person to put on and taking off regular lower body clothing?: A Lot 6 Click Score: 17    End of Session Equipment Utilized During Treatment: Rolling walker (2 wheels)  OT Visit Diagnosis: Other abnormalities of gait and mobility (R26.89)   Activity Tolerance Patient tolerated treatment well   Patient Left in bed;with call bell/phone within reach   Nurse Communication Mobility status        Time: 6606-3016 OT Time Calculation (min): 11 min  Charges: OT General Charges $OT Visit: 1 Visit OT Treatments $Self Care/Home Management : 8-22 mins  [name] OT OT pager: [phone]   [name] 07/08/2021, 1:09 PM

## 2021-07-08 NOTE — Progress Notes (Addendum)
Physical Therapy Treatment Patient Details Name: [name] MRN: [id number] DOB: 1928/07/24 Today's Date: 07/08/2021   History of Present Illness 88 yo female admitted with L LE weakness. Negative MRI brain; MRI lumbar spine with moderate levoscoliosis and multilevel lumbar spondylosis, mild to moderate left lateral recess stenosis at L4-L5 could affect the descending L L5 nerve root. Hx of fibromyalgia, osteoporosis, breast cancer s/p mastectomy, COVID, osteopenia.    PT Comments    Pt continued to demonstrate LLE weakness with MMT, exercises and functional mobility. Pt able to perform active L dorsiflexion in sitting, though weak, occasional lack of dorsiflexion with gait resulting in sliding step progression. Pt noted to have decreased L LE AROM, strength and speed with seated BLE strengthening exercises, pt reports increased difficulty performing exercises on L leg compared to R leg. Pt ambulates with slow step to pattern, decreased L weight-bearing and stance time, noted to have L trendelenburg vs knee hyperextension with fatigue, requires min assist to min guard assist for steadying assistance. Pt fatigues with ambulation, no SOB but reports LLE needs a break. Pt reports sensation equal bil throughout BLE, denies back pain or numbness/tingling. Pt reports pain in LLE that is "spasm" and will jerk her while sitting, not present in standing per pt. Pt from home alone without device, currently requiring min assist with ambulation and requires RW for steadying assistance, pt is a high fall risk and recommending ST SNF prior to return home alone. Continue to progress as able.   Recommendations for follow up therapy are one component of a multi-disciplinary discharge planning process, led by the attending physician.  Recommendations may be updated based on patient status, additional functional criteria and insurance authorization.  Follow Up Recommendations  Skilled nursing-short term rehab (<3  hours/day)     Assistance Recommended at Discharge Frequent or constant Supervision/Assistance  Patient can return home with the following A little help with walking and/or transfers;A little help with bathing/dressing/bathroom;Assistance with cooking/housework;Help with stairs or ramp for entrance;Assist for transportation   Equipment Recommendations  Rolling walker (2 wheels) (youth height)    Recommendations for Other Services       Precautions / Restrictions Precautions Precautions: Fall Restrictions Weight Bearing Restrictions: No     Mobility  Bed Mobility  General bed mobility comments: in recliner    Transfers Overall transfer level: Needs assistance Equipment used: Rolling walker (2 wheels) Transfers: Sit to/from Stand Sit to Stand: Min guard  General transfer comment: VC for hand placement, min guard for steadying with rising to stand    Ambulation/Gait Ambulation/Gait assistance: Min assist, Min guard Gait Distance (Feet): 80 Feet Assistive device: Rolling walker (2 wheels) Gait Pattern/deviations: Step-to pattern, Trunk flexed, Decreased stance time - left, Decreased weight shift to left, Trendelenburg Gait velocity: decreased  General Gait Details: varies min guard-min A, slow, step to pattern wtih decreased L dorsiflexion with occasionally sliding step progression, decreased L weight-bearing and stance time, noted possible L trendelenburg vs knee hyperextension with fatigue but not every step, limited by fatigue   Stairs             Wheelchair Mobility    Modified Rankin (Stroke Patients Only)       Balance Overall balance assessment: Needs assistance  Standing balance support: Reliant on assistive device for balance, During functional activity, Bilateral upper extremity supported Standing balance-Leahy Scale: Poor     Cognition Arousal/Alertness: Awake/alert Behavior During Therapy: WFL for tasks assessed/performed Overall Cognitive  Status: Within Functional Limits for tasks  assessed     Exercises General Exercises - Lower Extremity Ankle Circles/Pumps: Seated, AROM, Both, 10 reps Long Arc Quad: Seated, AROM, Strengthening, Both, 10 reps Hip ABduction/ADduction: Seated, AROM, Strengthening, Both, 5 reps (therapist providing resistance for adduction and abduction, 5 reps each direction with L weaker than R) Straight Leg Raises: Seated, AROM, Strengthening, Both, 10 reps Hip Flexion/Marching: Seated, AROM, Strengthening, Both, 10 reps    General Comments        Pertinent Vitals/Pain Pain Assessment Pain Assessment: Faces Faces Pain Scale: Hurts a little bit Pain Location: L LE Pain Descriptors / Indicators: Spasm Pain Intervention(s): Limited activity within patient's tolerance, Monitored during session    Home Living                          Prior Function            PT Goals (current goals can now be found in the care plan section) Acute Rehab PT Goals Patient Stated Goal: to regain independence/PLOF PT Goal Formulation: With patient Time For Goal Achievement: 07/20/21 Potential to Achieve Goals: Good Progress towards PT goals: Progressing toward goals    Frequency    Min 3X/week      PT Plan Current plan remains appropriate    Co-evaluation              AM-PAC PT "6 Clicks" Mobility   Outcome Measure  Help needed turning from your back to your side while in a flat bed without using bedrails?: None Help needed moving from lying on your back to sitting on the side of a flat bed without using bedrails?: A Little Help needed moving to and from a bed to a chair (including a wheelchair)?: A Little Help needed standing up from a chair using your arms (e.g., wheelchair or bedside chair)?: A Little Help needed to walk in hospital room?: A Little Help needed climbing 3-5 steps with a railing? : A Lot 6 Click Score: 18    End of Session Equipment Utilized During Treatment: Gait  belt Activity Tolerance: Patient tolerated treatment well Patient left: in chair;with call bell/phone within reach Nurse Communication: Mobility status PT Visit Diagnosis: Muscle weakness (generalized) (M62.81);Difficulty in walking, not elsewhere classified (R26.2);Other abnormalities of gait and mobility (R26.89)     Time: 8299-3716 PT Time Calculation (min) (ACUTE ONLY): 24 min  Charges:  $Gait Training: 8-22 mins $Therapeutic Exercise: 8-22 mins                      Tori Kaitrin Seybold PT, DPT 07/08/21, 11:34 AM

## 2021-07-08 NOTE — Progress Notes (Signed)
PROGRESS NOTE    [name]  NFA:[id number] DOB: 09/16/1928 DOA: 07/05/2021 PCP: Crist Infante, MD  Chief Complaint  Patient presents with   Weakness    Brief Narrative:  88 yo with hx breast cancer, HLD, HTN, diet controlled diabetes who presented on 2/7 with generalized weakness and progressive difficulty walking.  She noted progressive LLE weakness and presented to the ED.  She's been admitted for additional workup of her generalized and left lower extremity weakness.    Assessment & Plan:   Principal Problem:   Left leg weakness Active Problems:   Spasm   HTN (hypertension)   Type II diabetes mellitus with renal manifestations (HCC)   Hypercholesterolemia   Anxiety   COVID-19 vaccine administered   History of breast cancer   GERD (gastroesophageal reflux disease)   Lower extremity weakness   Assessment and Plan: * Left leg weakness- (present on admission) New LLE weakness Previously independent Negative MRI brain MRI lumbar spine with moderate levoscoliosis and multilevel lumbar spondylosis as described above, mild to moderate left lateral recess stenosis at L4-L5 could affect the descending L L5 nerve root C spine MRI with moderate L neuroforaminal stenosis at C6-7, erosive change of dens suggestive of CPPD T spine MRI with short segment patchy non expansile non compressive increased T2 signal in anterior cord at T6 without enhancement Repeat MRI today per neurology LP for cells, glucose, protein, oligoclonal bands, IgG index IV steroids x5 days (500 mg daily) Appreciate neurology assistance   Spasm Describes jerks and spasms of LLE Follow MRI as above, consider additional w/u CK wnl Appreciate neurology   Type II diabetes mellitus with renal manifestations (HCC)- (present on admission) Follow BG's Diet controlled Follow A1c (pending)   HTN (hypertension)- (present on admission) Metoprolol, amlodipine  Anxiety- (present on  admission) xanax  Hypercholesterolemia- (present on admission) zetia  COVID-19 vaccine administered S/ p booster on 2/4    DVT prophylaxis: lovenox Code Status: DNR Family Communication: none Disposition:   Status is: Observation The patient will require care spanning > 2 midnights and should be moved to inpatient because: continued LLE weakness requiring neurology c/s    Consultants:  neurology  Procedures:  LP 2/10  Antimicrobials:  Anti-infectives (From admission, onward)    None      Subjective: No new complaints  Objective: Vitals:   07/07/21 2034 07/08/21 0507 07/08/21 0817 07/08/21 1455  BP: 133/74 133/70 132/61 134/64  Pulse: 88 93 (!) 102 81  Resp: 17 17 18 17   Temp: 98.2 F (36.8 C) 98 F (36.7 C)  98.3 F (36.8 C)  TempSrc: Oral Oral  Oral  SpO2: 95% 98% 96% 100%  Height:        Intake/Output Summary (Last 24 hours) at 07/08/2021 1742 Last data filed at 07/08/2021 0940 Gross per 24 hour  Intake 240 ml  Output 400 ml  Net -160 ml   There were no vitals filed for this visit.  Examination:  General: No acute distress. Cardiovascular: RRR Lungs: unlabored Abdomen: Soft, nontender, nondistended Neurological: continued LLE weakness, stable Skin: Warm and dry. No rashes or lesions. Extremities: No clubbing or cyanosis. No edema.   Data Reviewed: I have personally reviewed following labs and imaging studies  CBC: Recent Labs  Lab 07/05/21 1134 07/05/21 1910 07/06/21 0350 07/07/21 0502 07/08/21 0542  WBC 11.9* 11.0* 10.5 8.3 9.5  NEUTROABS  --   --   --  5.1 6.1  HGB 13.0 12.4 12.7 12.9 13.4  HCT 39.6  38.0 39.1 38.9 41.2  MCV 90.6 90.5 92.7 91.3 91.2  PLT 228 235 214 218 811    Basic Metabolic Panel: Recent Labs  Lab 07/05/21 1134 07/05/21 1910 07/06/21 0350 07/07/21 0502 07/08/21 0542  NA 136  --  135 136 135  K 4.1  --  3.2* 4.3 3.7  CL 103  --  101 103 103  CO2 25  --  23 27 24   GLUCOSE 202*  --  236* 106* 104*   BUN 18  --  16 19 22   CREATININE 0.78 0.69 0.94 0.93 0.67  CALCIUM 9.2  --  8.8* 8.7* 8.9  MG  --   --  2.1 2.2 2.2  PHOS  --   --  3.3 3.6 3.8    GFR: CrCl cannot be calculated (Unknown ideal weight.).  Liver Function Tests: Recent Labs  Lab 07/05/21 1134 07/06/21 0350 07/07/21 0502 07/08/21 0542  AST 20 22 17 18   ALT 19 17 16 17   ALKPHOS 46 42 40 38  BILITOT 0.1* 0.5 0.3 0.3  PROT 7.1 6.1* 6.3* 6.1*  ALBUMIN 4.0 3.5 3.4* 3.5    CBG: No results for input(s): GLUCAP in the last 168 hours.   Recent Results (from the past 240 hour(s))  Resp Panel by RT-PCR (Flu [name]&B, Covid) Nasopharyngeal Swab     Status: None   Collection Time: 07/05/21 11:34 AM   Specimen: Nasopharyngeal Swab; Nasopharyngeal(NP) swabs in vial transport medium  Result Value Ref Range Status   SARS Coronavirus 2 by RT PCR NEGATIVE NEGATIVE Final    Comment: (NOTE) SARS-CoV-2 target nucleic acids are NOT DETECTED.  The SARS-CoV-2 RNA is generally detectable in upper respiratory specimens during the acute phase of infection. The lowest concentration of SARS-CoV-2 viral copies this assay can detect is 138 copies/mL. [name] negative result does not preclude SARS-Cov-2 infection and should not be used as the sole basis for treatment or other patient management decisions. [name] negative result may occur with  improper specimen collection/handling, submission of specimen other than nasopharyngeal swab, presence of viral mutation(s) within the areas targeted by this assay, and inadequate number of viral copies(<138 copies/mL). [name] negative result must be combined with clinical observations, patient history, and epidemiological information. The expected result is Negative.  Fact Sheet for Patients:  EntrepreneurPulse.com.au  Fact Sheet for Healthcare Providers:  IncredibleEmployment.be  This test is no t yet approved or cleared by the [place] FDA and  has been authorized  for detection and/or diagnosis of SARS-CoV-2 by FDA under an Emergency Use Authorization (EUA). This EUA will remain  in effect (meaning this test can be used) for the duration of the COVID-19 declaration under Section 564(b)(1) of the Act, 21 U.S.C.section 360bbb-3(b)(1), unless the authorization is terminated  or revoked sooner.       Influenza Satya Buttram by PCR NEGATIVE NEGATIVE Final   Influenza B by PCR NEGATIVE NEGATIVE Final    Comment: (NOTE) The Xpert Xpress SARS-CoV-2/FLU/RSV plus assay is intended as an aid in the diagnosis of influenza from Nasopharyngeal swab specimens and should not be used as Deldrick Linch sole basis for treatment. Nasal washings and aspirates are unacceptable for Xpert Xpress SARS-CoV-2/FLU/RSV testing.  Fact Sheet for Patients: EntrepreneurPulse.com.au  Fact Sheet for Healthcare Providers: IncredibleEmployment.be  This test is not yet approved or cleared by the [place] FDA and has been authorized for detection and/or diagnosis of SARS-CoV-2 by FDA under an Emergency Use Authorization (EUA). This EUA will remain in effect (meaning this test  can be used) for the duration of the COVID-19 declaration under Section 564(b)(1) of the Act, 21 U.S.C. section 360bbb-3(b)(1), unless the authorization is terminated or revoked.  Performed at [facility], [place] [address] [place], [place] [address]          Radiology Studies: MR CERVICAL SPINE W WO CONTRAST  Result Date: 07/07/2021 CLINICAL DATA:  Progressive left leg weakness. Remote history of breast cancer. EXAM: MRI CERVICAL AND THORACIC SPINE WITHOUT AND WITH CONTRAST TECHNIQUE: Multiplanar and multiecho pulse sequences of the cervical spine, to include the craniocervical junction and cervicothoracic junction, and the thoracic spine, were obtained without and with intravenous contrast. CONTRAST:  70mL GADAVIST GADOBUTROL 1 MMOL/ML IV SOLN COMPARISON:  None.  FINDINGS: MRI CERVICAL SPINE FINDINGS Alignment: Trace anterolisthesis at C3-C4, C4-C5, and C7-T1. 3 mm anterolisthesis at C5-C6. Vertebrae: No fracture, evidence of discitis, or bone lesion. Erosive changes of the dens with prominent surrounding T2 hypointense, T1 isointense nonenhancing signal. Cord: Normal signal and morphology.  No intrathecal enhancement. Posterior Fossa, vertebral arteries, paraspinal tissues: Chronic microvascular ischemic changes in the pons. Disc levels: C2-C3: Negative disc. Moderate right and mild left facet arthropathy. Mild right neuroforaminal stenosis. No spinal canal or left neuroforaminal stenosis. C3-C4: Tiny central disc protrusion. Moderate bilateral facet arthropathy. Mild left neuroforaminal stenosis. No spinal canal or right neuroforaminal stenosis. C4-C5: Negative disc. Mild left uncovertebral hypertrophy. Moderate right facet arthropathy. Ankylosis of the left facet joint. No stenosis. C5-C6: Circumferential disc bulging with mild-to-moderate bilateral facet uncovertebral hypertrophy. No stenosis. C6-C7: Small posterior disc osteophyte complex. Moderate left and mild right uncovertebral hypertrophy. Ankylosis of the right facet joint. Moderate left neuroforaminal stenosis. No spinal canal or right neuroforaminal stenosis. C7-T1: Small circumferential disc osteophyte complex and moderate bilateral facet arthropathy. Mild left neuroforaminal stenosis. No spinal canal or right neuroforaminal stenosis. MRI THORACIC SPINE FINDINGS Alignment:  Physiologic. Vertebrae: No fracture, evidence of discitis, or bone lesion. Cord: Patchy non-expansile increased T2 signal in the anterior cord at T6 (series 19, images 11 and 12). No associated enhancement. Remaining cord signal is normal. Paraspinal and other soft tissues: Large hiatal hernia. Otherwise negative. Disc levels: Mild disc bulging from T1-T2 through T4-T5 and at T6-T7. Small central disc protrusion at T11-T12. Scattered mild  thoracic facet arthropathy. No spinal canal or neuroforaminal stenosis at any level. IMPRESSION: Cervical spine: 1. Multilevel cervical spondylosis as described above. Moderate left neuroforaminal stenosis at C6-C7. 2. Normal cervical cord. 3. Erosive changes of the dens with surrounding low signal suggestive of CPPD arthropathy. Thoracic spine: 1. Short-segment patchy non-expansile non-compressive increased T2 signal in the anterior cord at T6 without enhancement. This is nonspecific and may reflect metabolic disease, chronic demyelinating disease, or chronic inflammatory/immune mediated disease. Electronically Signed   By: Titus Dubin M.D.   On: 07/07/2021 18:32   MR THORACIC SPINE W WO CONTRAST  Result Date: 07/07/2021 CLINICAL DATA:  Progressive left leg weakness. Remote history of breast cancer. EXAM: MRI CERVICAL AND THORACIC SPINE WITHOUT AND WITH CONTRAST TECHNIQUE: Multiplanar and multiecho pulse sequences of the cervical spine, to include the craniocervical junction and cervicothoracic junction, and the thoracic spine, were obtained without and with intravenous contrast. CONTRAST:  80mL GADAVIST GADOBUTROL 1 MMOL/ML IV SOLN COMPARISON:  None. FINDINGS: MRI CERVICAL SPINE FINDINGS Alignment: Trace anterolisthesis at C3-C4, C4-C5, and C7-T1. 3 mm anterolisthesis at C5-C6. Vertebrae: No fracture, evidence of discitis, or bone lesion. Erosive changes of the dens with prominent surrounding T2 hypointense, T1 isointense nonenhancing signal. Cord: Normal  signal and morphology.  No intrathecal enhancement. Posterior Fossa, vertebral arteries, paraspinal tissues: Chronic microvascular ischemic changes in the pons. Disc levels: C2-C3: Negative disc. Moderate right and mild left facet arthropathy. Mild right neuroforaminal stenosis. No spinal canal or left neuroforaminal stenosis. C3-C4: Tiny central disc protrusion. Moderate bilateral facet arthropathy. Mild left neuroforaminal stenosis. No spinal canal or  right neuroforaminal stenosis. C4-C5: Negative disc. Mild left uncovertebral hypertrophy. Moderate right facet arthropathy. Ankylosis of the left facet joint. No stenosis. C5-C6: Circumferential disc bulging with mild-to-moderate bilateral facet uncovertebral hypertrophy. No stenosis. C6-C7: Small posterior disc osteophyte complex. Moderate left and mild right uncovertebral hypertrophy. Ankylosis of the right facet joint. Moderate left neuroforaminal stenosis. No spinal canal or right neuroforaminal stenosis. C7-T1: Small circumferential disc osteophyte complex and moderate bilateral facet arthropathy. Mild left neuroforaminal stenosis. No spinal canal or right neuroforaminal stenosis. MRI THORACIC SPINE FINDINGS Alignment:  Physiologic. Vertebrae: No fracture, evidence of discitis, or bone lesion. Cord: Patchy non-expansile increased T2 signal in the anterior cord at T6 (series 19, images 11 and 12). No associated enhancement. Remaining cord signal is normal. Paraspinal and other soft tissues: Large hiatal hernia. Otherwise negative. Disc levels: Mild disc bulging from T1-T2 through T4-T5 and at T6-T7. Small central disc protrusion at T11-T12. Scattered mild thoracic facet arthropathy. No spinal canal or neuroforaminal stenosis at any level. IMPRESSION: Cervical spine: 1. Multilevel cervical spondylosis as described above. Moderate left neuroforaminal stenosis at C6-C7. 2. Normal cervical cord. 3. Erosive changes of the dens with surrounding low signal suggestive of CPPD arthropathy. Thoracic spine: 1. Short-segment patchy non-expansile non-compressive increased T2 signal in the anterior cord at T6 without enhancement. This is nonspecific and may reflect metabolic disease, chronic demyelinating disease, or chronic inflammatory/immune mediated disease. Electronically Signed   By: Titus Dubin M.D.   On: 07/07/2021 18:32   DG FLUORO GUIDE LUMBAR PUNCTURE  Result Date: 07/08/2021 CLINICAL DATA:  Lower extremity  weakness EXAM: DIAGNOSTIC LUMBAR PUNCTURE UNDER FLUOROSCOPIC GUIDANCE COMPARISON:  Lumbar spine MRI 07/06/2021 FLUOROSCOPY: Fluoroscopy Time:  0 minutes, 48 seconds Radiation Exposure Index (if provided by the fluoroscopic device): 4 mGy air kerma Number of Acquired Spot Images: 1 PROCEDURE: I discussed the risks (including hemorrhage, infection, headache, and nerve damage, among others), benefits, and alternatives to fluoroscopically guided lumbar puncture with the patient. We specifically discussed the moderately high technical likelihood of success of the procedure (in the context of the patient's spondylosis and scoliosis). The patient understood and elected to undergo the procedure. Standard time-out was employed. Following sterile skin prep and local anesthetic administration consisting of 1 percent lidocaine, Yasiel Goyne 22 gauge spinal needle was advanced without difficulty into the thecal sac at the at the L5-S1 level under fluoroscopic guidance. Red tinged CSF was returned. Opening pressure was not assessed. 12 cc of CSF was collected. The needle was subsequently removed and the skin cleansed and bandaged. No immediate complications were observed. IMPRESSION: 1. Technically successful fluoroscopically guided lumbar puncture at the L5-S1 level yielding 12 cc of at least initially red tinged CSF. Electronically Signed   By: Van Clines M.D.   On: 07/08/2021 14:51        Scheduled Meds:  amLODipine  2.5 mg Oral Daily   aspirin  325 mg Oral Daily   docusate sodium  100 mg Oral BID   ezetimibe  10 mg Oral Daily   metoprolol succinate  12.5 mg Oral Daily   pantoprazole  40 mg Oral Daily   Continuous Infusions:  methylPREDNISolone (SOLU-MEDROL) injection  LOS: 1 day    Time spent: over 30 min    Fayrene Helper, MD [facility]   To contact the attending provider between 7A-7P or the covering provider during after hours 7P-7A, please log into the web site [email or web address] and  access using universal [place] password for that web site. If you do not have the password, please call the hospital operator.  07/08/2021, 5:42 PM

## 2021-07-08 NOTE — Procedures (Signed)
CLINICAL DATA: Lower extremity weakness EXAM:  DIAGNOSTIC LUMBAR PUNCTURE UNDER FLUOROSCOPIC GUIDANCE COMPARISON: Lumbar spine MRI 07/06/2021  FLUOROSCOPY: Fluoroscopy Time:  0 minutes, 48 seconds  Radiation Exposure Index (if provided by the fluoroscopic device):  4 mGy air kerma Number of Acquired Spot Images: 1 PROCEDURE:  I discussed the risks (including hemorrhage, infection, headache, and nerve damage, among others), benefits, and alternatives to fluoroscopically guided lumbar puncture with the patient.  We specifically discussed the moderately high technical likelihood of success of the procedure (in the context of the patient's spondylosis and scoliosis).  The patient understood and elected to undergo the procedure.    Standard time-out was employed.  Following sterile skin prep and local anesthetic administration consisting of 1 percent lidocaine, a 22 gauge spinal needle was advanced without difficulty into the thecal sac at the at the L5-S1 level under fluoroscopic guidance.  Red tinged CSF was returned.  Opening pressure was not assessed.   12 cc of CSF was collected.  The needle was subsequently removed and the skin cleansed and bandaged.  No immediate complications were observed.     IMPRESSION:  Technically successful fluoroscopically guided lumbar puncture at the L5-S1 level yielding 12 cc of at least initially red tinged CSF.

## 2021-07-08 NOTE — Progress Notes (Signed)
Subjective: No significant change, just had LP  Exam: Vitals:   07/08/21 0817 07/08/21 1455  BP: 132/61 134/64  Pulse: (!) 102 81  Resp: 18 17  Temp:  98.3 F (36.8 C)  SpO2: 96% 100%   Gen: In bed, NAD Resp: non-labored breathing, no acute distress Abd: soft, nt  Neuro: MS: Awake, alert, interactive and appropriate CN: EOMI, face symmetric Motor: 5/5 in bilateral arms, she has 4/5 weakness in left leg DTR: She has triple flexion to plantar stimulation on the left  Pertinent Labs: CSF WBC 2 CSF RBC 2230 CSF protein 46(elevated but accounted for by traumatic contamination) CSF glucose 85  Impression: 88 year old female with new onset left leg weakness with a thoracic spine T2 lesion.  I think possibilities at this point include an inflammatory post-adjuvant myelitis versus spinal cord ischemia.  Given the timeframe, I would favor an inflammatory reaction, though this is not demonstrated with enhancement or pleocytosis. I do favor starting steroids at this time.  Diffusion-weighted imaging could be helpful to further differentiate, but is unreliable in the spine and often difficult to interpret.  Though I typically do 1 g daily of Solu-Medrol, given the patient's advanced age I would favor using a reduced dose of 500 mg daily.  Recommendations: 1) diffusion weighted sequences of the thoracic spine 2) Solu-Medrol 500 mg daily  Roland Rack, MD [facility] [phone]  If 7pm- 7am, please page neurology on call as listed in [place].

## 2021-07-09 ENCOUNTER — Inpatient Hospital Stay (HOSPITAL_COMMUNITY): Payer: Medicare Other

## 2021-07-09 LAB — HEMOGLOBIN A1C
Hgb A1c MFr Bld: 6.3 % — ABNORMAL HIGH (ref 4.8–5.6)
Mean Plasma Glucose: 134.11 mg/dL

## 2021-07-09 MED ORDER — PSYLLIUM 95 % PO PACK
1.0000 | PACK | Freq: Every day | ORAL | Status: DC
  Administered 2021-07-09 – 2021-07-12 (×4): 1 via ORAL
  Filled 2021-07-09 (×4): qty 1

## 2021-07-09 NOTE — Progress Notes (Signed)
PROGRESS NOTE    [name]  GYJ:[id number] DOB: [birth year] DOA: 07/05/2021 PCP: Crist Infante, MD  Chief Complaint  Patient presents with   Weakness    Brief Narrative:  88 yo with hx breast cancer, HLD, HTN, diet controlled diabetes who presented on 2/7 with generalized weakness and progressive difficulty walking.  She noted progressive LLE weakness and presented to the ED.  She's been admitted for additional workup of her generalized and left lower extremity weakness.    Assessment & Plan:   Principal Problem:   Left leg weakness Active Problems:   Spasm   HTN (hypertension)   Type II diabetes mellitus with renal manifestations (HCC)   Hypercholesterolemia   Anxiety   COVID-19 vaccine administered   History of breast cancer   GERD (gastroesophageal reflux disease)   Lower extremity weakness   Assessment and Plan: * Left leg weakness- (present on admission) New LLE weakness Previously independent Negative MRI brain MRI lumbar spine with moderate levoscoliosis and multilevel lumbar spondylosis as described above, mild to moderate left lateral recess stenosis at L4-L5 could affect the descending L L5 nerve root C spine MRI with moderate L neuroforaminal stenosis at C6-7, erosive change of dens suggestive of CPPD T spine MRI with short segment patchy non expansile non compressive increased T2 signal in anterior cord at T6 without enhancement Repeat MRI pending LP for cells (2 WBCs, 2,230 RBC, mildly elevated protein,  Glucose elevated - follow oligoclonal bands, IgG index pending collection)   IV steroids x5 days (500 mg daily) Appreciate neurology assistance Mild improvement noted by her today   Spasm Describes jerks and spasms of LLE Follow MRI as above, consider additional w/u CK wnl Appreciate neurology   Type II diabetes mellitus with renal manifestations ([place])- (present on admission) Follow BG's Diet controlled Follow A1c (6.3)   HTN (hypertension)-  (present on admission) Metoprolol, amlodipine  Anxiety- (present on admission) xanax  Hypercholesterolemia- (present on admission) zetia  COVID-19 vaccine administered S/ p booster on 2/4    DVT prophylaxis: lovenox Code Status: DNR Family Communication: none Disposition:   Status is: Observation The patient will require care spanning > 2 midnights and should be moved to inpatient because: continued LLE weakness requiring neurology c/s    Consultants:  neurology  Procedures:  LP 2/10  Antimicrobials:  Anti-infectives (From admission, onward)    None      Subjective: Feels [name] little better today  Objective: Vitals:   07/08/21 1455 07/08/21 2035 07/09/21 0339 07/09/21 1345  BP: 134/64 (!) 146/73 (!) 148/83 139/71  Pulse: 81 90 (!) 107 93  Resp: 17 18 17 14   Temp: 98.3 F (36.8 C) 98.1 F (36.7 C) 97.6 F (36.4 C) 98.3 F (36.8 C)  TempSrc: Oral Oral Oral Oral  SpO2: 100% 95% 97% 99%  Height:        Intake/Output Summary (Last 24 hours) at 07/09/2021 1656 Last data filed at 07/09/2021 1300 Gross per 24 hour  Intake 738 ml  Output --  Net 738 ml   There were no vitals filed for this visit.  Examination:  General: No acute distress. Cardiovascular: RRR Lungs: unlabored Abdomen: Soft, nontender, nondistended  Neurological: LLE weakness 4/5, upgoing toes on L Skin: Warm and dry. No rashes or lesions. Extremities: No clubbing or cyanosis. No edema.  Data Reviewed: I have personally reviewed following labs and imaging studies  CBC: Recent Labs  Lab 07/05/21 1134 07/05/21 1910 07/06/21 0350 07/07/21 0502 07/08/21 0542  WBC 11.9*  11.0* 10.5 8.3 9.5  NEUTROABS  --   --   --  5.1 6.1  HGB 13.0 12.4 12.7 12.9 13.4  HCT 39.6 38.0 39.1 38.9 41.2  MCV 90.6 90.5 92.7 91.3 91.2  PLT 228 235 214 218 025    Basic Metabolic Panel: Recent Labs  Lab 07/05/21 1134 07/05/21 1910 07/06/21 0350 07/07/21 0502 07/08/21 0542  NA 136  --  135 136 135   K 4.1  --  3.2* 4.3 3.7  CL 103  --  101 103 103  CO2 25  --  23 27 24   GLUCOSE 202*  --  236* 106* 104*  BUN 18  --  16 19 22   CREATININE 0.78 0.69 0.94 0.93 0.67  CALCIUM 9.2  --  8.8* 8.7* 8.9  MG  --   --  2.1 2.2 2.2  PHOS  --   --  3.3 3.6 3.8    GFR: CrCl cannot be calculated (Unknown ideal weight.).  Liver Function Tests: Recent Labs  Lab 07/05/21 1134 07/06/21 0350 07/07/21 0502 07/08/21 0542  AST 20 22 17 18   ALT 19 17 16 17   ALKPHOS 46 42 40 38  BILITOT 0.1* 0.5 0.3 0.3  PROT 7.1 6.1* 6.3* 6.1*  ALBUMIN 4.0 3.5 3.4* 3.5    CBG: No results for input(s): GLUCAP in the last 168 hours.   Recent Results (from the past 240 hour(s))  Resp Panel by RT-PCR (Flu [place] [facility], Covid) Nasopharyngeal Swab     Status: None   Collection Time: 07/05/21 11:34 AM   Specimen: Nasopharyngeal Swab; Nasopharyngeal(NP) swabs in vial transport medium  Result Value Ref Range Status   SARS Coronavirus 2 by RT PCR NEGATIVE NEGATIVE Final    Comment: (NOTE) SARS-CoV-2 target nucleic acids are NOT DETECTED.  The SARS-CoV-2 RNA is generally detectable in upper respiratory specimens during the acute phase of infection. The lowest concentration of SARS-CoV-2 viral copies this assay can detect is 138 copies/mL. Alvey Brockel negative result does not preclude SARS-Cov-2 infection and should not be used as the sole basis for treatment or other patient management decisions. Torsten Weniger negative result may occur with  improper specimen collection/handling, submission of specimen other than nasopharyngeal swab, presence of viral mutation(s) within the areas targeted by this assay, and inadequate number of viral copies(<138 copies/mL). Ariyan Sinnett negative result must be combined with clinical observations, patient history, and epidemiological information. The expected result is Negative.  Fact Sheet for Patients:  EntrepreneurPulse.com.au  Fact Sheet for Healthcare Providers:   IncredibleEmployment.be  This test is no t yet approved or cleared by the [place] FDA and  has been authorized for detection and/or diagnosis of SARS-CoV-2 by FDA under an Emergency Use Authorization (EUA). This EUA will remain  in effect (meaning this test can be used) for the duration of the COVID-19 declaration under Section 564(b)(1) of the Act, 21 U.S.C.section 360bbb-3(b)(1), unless the authorization is terminated  or revoked sooner.       Influenza Aser Nylund by PCR NEGATIVE NEGATIVE Final   Influenza B by PCR NEGATIVE NEGATIVE Final    Comment: (NOTE) The Xpert Xpress SARS-CoV-2/FLU/RSV plus assay is intended as an aid in the diagnosis of influenza from Nasopharyngeal swab specimens and should not be used as Dhanvin Szeto sole basis for treatment. Nasal washings and aspirates are unacceptable for Xpert Xpress SARS-CoV-2/FLU/RSV testing.  Fact Sheet for Patients: EntrepreneurPulse.com.au  Fact Sheet for Healthcare Providers: IncredibleEmployment.be  This test is not yet approved or cleared by the [place] FDA  and has been authorized for detection and/or diagnosis of SARS-CoV-2 by FDA under an Emergency Use Authorization (EUA). This EUA will remain in effect (meaning this test can be used) for the duration of the COVID-19 declaration under Section 564(b)(1) of the Act, 21 U.S.C. section 360bbb-3(b)(1), unless the authorization is terminated or revoked.  Performed at [facility], [place] [address], [place], [place] [address]   CSF culture w Gram Stain     Status: None (Preliminary result)   Collection Time: 07/08/21  2:10 PM   Specimen: PATH Cytology CSF; Cerebrospinal Fluid  Result Value Ref Range Status   Specimen Description   Final    CSF Performed at [facility] [address], [place], [place] [address]    Special Requests   Final    NONE Performed at [facility], [place] [address], [place], [place] [address]    Gram Stain   Final    WBC PRESENT,BOTH PMN AND MONONUCLEAR NO ORGANISMS SEEN CYTOSPIN SMEAR Gram Stain Report Called to,Read Back By and Verified With: GUIRAL,J ON 07/08/2021 AT 1819 BY LUZOLOP GRAM STAIN REVIEWED-AGREE WITH RESULT    Culture   Final    NO GROWTH < 24 HOURS Performed at [facility], [place] [address], [place], [place] [address]    Report Status PENDING  Incomplete         Radiology Studies: MR CERVICAL SPINE W WO CONTRAST  Result Date: 07/07/2021 CLINICAL DATA:  Progressive left leg weakness. Remote history of breast cancer. EXAM: MRI CERVICAL AND THORACIC SPINE WITHOUT AND WITH CONTRAST TECHNIQUE: Multiplanar and multiecho pulse sequences of the cervical spine, to include the craniocervical junction and cervicothoracic junction, and the thoracic spine, were obtained without and with intravenous contrast. CONTRAST:  8mL GADAVIST GADOBUTROL 1 MMOL/ML IV SOLN COMPARISON:  None. FINDINGS: MRI CERVICAL SPINE FINDINGS Alignment: Trace anterolisthesis at C3-C4, C4-C5, and C7-T1. 3 mm anterolisthesis at C5-C6. Vertebrae: No fracture, evidence of discitis, or bone lesion. Erosive changes of the dens with prominent surrounding T2 hypointense, T1 isointense nonenhancing signal. Cord: Normal signal and morphology.  No intrathecal enhancement. Posterior Fossa, vertebral arteries, paraspinal tissues: Chronic microvascular ischemic changes in the pons. Disc levels: C2-C3: Negative disc. Moderate right and mild left facet arthropathy. Mild right neuroforaminal stenosis. No spinal canal or left neuroforaminal stenosis. C3-C4: Tiny central disc protrusion. Moderate bilateral facet arthropathy. Mild left neuroforaminal stenosis. No spinal canal or right neuroforaminal stenosis. C4-C5: Negative disc. Mild left uncovertebral hypertrophy. Moderate right facet arthropathy. Ankylosis of the left facet joint. No stenosis. C5-C6:  Circumferential disc bulging with mild-to-moderate bilateral facet uncovertebral hypertrophy. No stenosis. C6-C7: Small posterior disc osteophyte complex. Moderate left and mild right uncovertebral hypertrophy. Ankylosis of the right facet joint. Moderate left neuroforaminal stenosis. No spinal canal or right neuroforaminal stenosis. C7-T1: Small circumferential disc osteophyte complex and moderate bilateral facet arthropathy. Mild left neuroforaminal stenosis. No spinal canal or right neuroforaminal stenosis. MRI THORACIC SPINE FINDINGS Alignment:  Physiologic. Vertebrae: No fracture, evidence of discitis, or bone lesion. Cord: Patchy non-expansile increased T2 signal in the anterior cord at T6 (series 19, images 11 and 12). No associated enhancement. Remaining cord signal is normal. Paraspinal and other soft tissues: Large hiatal hernia. Otherwise negative. Disc levels: Mild disc bulging from T1-T2 through T4-T5 and at T6-T7. Small central disc protrusion at T11-T12. Scattered mild thoracic facet arthropathy. No spinal canal or neuroforaminal stenosis at any level. IMPRESSION: Cervical spine: 1. Multilevel cervical spondylosis as described  above. Moderate left neuroforaminal stenosis at C6-C7. 2. Normal cervical cord. 3. Erosive changes of the dens with surrounding low signal suggestive of CPPD arthropathy. Thoracic spine: 1. Short-segment patchy non-expansile non-compressive increased T2 signal in the anterior cord at T6 without enhancement. This is nonspecific and may reflect metabolic disease, chronic demyelinating disease, or chronic inflammatory/immune mediated disease. Electronically Signed   By: Titus Dubin M.D.   On: 07/07/2021 18:32   MR THORACIC SPINE W WO CONTRAST  Result Date: 07/07/2021 CLINICAL DATA:  Progressive left leg weakness. Remote history of breast cancer. EXAM: MRI CERVICAL AND THORACIC SPINE WITHOUT AND WITH CONTRAST TECHNIQUE: Multiplanar and multiecho pulse sequences of the  cervical spine, to include the craniocervical junction and cervicothoracic junction, and the thoracic spine, were obtained without and with intravenous contrast. CONTRAST:  76mL GADAVIST GADOBUTROL 1 MMOL/ML IV SOLN COMPARISON:  None. FINDINGS: MRI CERVICAL SPINE FINDINGS Alignment: Trace anterolisthesis at C3-C4, C4-C5, and C7-T1. 3 mm anterolisthesis at C5-C6. Vertebrae: No fracture, evidence of discitis, or bone lesion. Erosive changes of the dens with prominent surrounding T2 hypointense, T1 isointense nonenhancing signal. Cord: Normal signal and morphology.  No intrathecal enhancement. Posterior Fossa, vertebral arteries, paraspinal tissues: Chronic microvascular ischemic changes in the pons. Disc levels: C2-C3: Negative disc. Moderate right and mild left facet arthropathy. Mild right neuroforaminal stenosis. No spinal canal or left neuroforaminal stenosis. C3-C4: Tiny central disc protrusion. Moderate bilateral facet arthropathy. Mild left neuroforaminal stenosis. No spinal canal or right neuroforaminal stenosis. C4-C5: Negative disc. Mild left uncovertebral hypertrophy. Moderate right facet arthropathy. Ankylosis of the left facet joint. No stenosis. C5-C6: Circumferential disc bulging with mild-to-moderate bilateral facet uncovertebral hypertrophy. No stenosis. C6-C7: Small posterior disc osteophyte complex. Moderate left and mild right uncovertebral hypertrophy. Ankylosis of the right facet joint. Moderate left neuroforaminal stenosis. No spinal canal or right neuroforaminal stenosis. C7-T1: Small circumferential disc osteophyte complex and moderate bilateral facet arthropathy. Mild left neuroforaminal stenosis. No spinal canal or right neuroforaminal stenosis. MRI THORACIC SPINE FINDINGS Alignment:  Physiologic. Vertebrae: No fracture, evidence of discitis, or bone lesion. Cord: Patchy non-expansile increased T2 signal in the anterior cord at T6 (series 19, images 11 and 12). No associated enhancement.  Remaining cord signal is normal. Paraspinal and other soft tissues: Large hiatal hernia. Otherwise negative. Disc levels: Mild disc bulging from T1-T2 through T4-T5 and at T6-T7. Small central disc protrusion at T11-T12. Scattered mild thoracic facet arthropathy. No spinal canal or neuroforaminal stenosis at any level. IMPRESSION: Cervical spine: 1. Multilevel cervical spondylosis as described above. Moderate left neuroforaminal stenosis at C6-C7. 2. Normal cervical cord. 3. Erosive changes of the dens with surrounding low signal suggestive of CPPD arthropathy. Thoracic spine: 1. Short-segment patchy non-expansile non-compressive increased T2 signal in the anterior cord at T6 without enhancement. This is nonspecific and may reflect metabolic disease, chronic demyelinating disease, or chronic inflammatory/immune mediated disease. Electronically Signed   By: Titus Dubin M.D.   On: 07/07/2021 18:32   DG FLUORO GUIDE LUMBAR PUNCTURE  Result Date: 07/08/2021 CLINICAL DATA:  Lower extremity weakness EXAM: DIAGNOSTIC LUMBAR PUNCTURE UNDER FLUOROSCOPIC GUIDANCE COMPARISON:  Lumbar spine MRI 07/06/2021 FLUOROSCOPY: Fluoroscopy Time:  0 minutes, 48 seconds Radiation Exposure Index (if provided by the fluoroscopic device): 4 mGy air kerma Number of Acquired Spot Images: 1 PROCEDURE: I discussed the risks (including hemorrhage, infection, headache, and nerve damage, among others), benefits, and alternatives to fluoroscopically guided lumbar puncture with the patient. We specifically discussed the moderately high technical likelihood of success of the procedure (  in the context of the patient's spondylosis and scoliosis). The patient understood and elected to undergo the procedure. Standard time-out was employed. Following sterile skin prep and local anesthetic administration consisting of 1 percent lidocaine, Jaycion Treml 22 gauge spinal needle was advanced without difficulty into the thecal sac at the at the L5-S1 level under  fluoroscopic guidance. Red tinged CSF was returned. Opening pressure was not assessed. 12 cc of CSF was collected. The needle was subsequently removed and the skin cleansed and bandaged. No immediate complications were observed. IMPRESSION: 1. Technically successful fluoroscopically guided lumbar puncture at the L5-S1 level yielding 12 cc of at least initially red tinged CSF. Electronically Signed   By: Van Clines M.D.   On: 07/08/2021 14:51        Scheduled Meds:  amLODipine  2.5 mg Oral Daily   aspirin  325 mg Oral Daily   docusate sodium  100 mg Oral BID   ezetimibe  10 mg Oral Daily   metoprolol succinate  12.5 mg Oral Daily   pantoprazole  40 mg Oral Daily   psyllium  1 packet Oral Daily   Continuous Infusions:  methylPREDNISolone (SOLU-MEDROL) injection 500 mg (07/09/21 1133)     LOS: 2 days    Time spent: over 30 min    Fayrene Helper, MD [facility]   To contact the attending provider between 7A-7P or the covering provider during after hours 7P-7A, please log into the web site [email or web address] and access using universal [place] password for that web site. If you do not have the password, please call the hospital operator.  07/09/2021, 4:56 PM

## 2021-07-09 NOTE — Progress Notes (Addendum)
Subjective: She feels her leg is improving  Exam: Vitals:   07/09/21 0339 07/09/21 1345  BP: (!) 148/83 139/71  Pulse: (!) 107 93  Resp: 17 14  Temp: 97.6 F (36.4 C) 98.3 F (36.8 C)  SpO2: 97% 99%   Gen: In bed, NAD Resp: non-labored breathing, no acute distress Abd: soft, nt  Neuro: MS: Awake, alert, interactive and appropriate CN: EOMI, face symmetric Motor: 5/5 in bilateral arms, she has 4/5 weakness in distal left leg, 4 -/5 in the proximal left leg DTR: She has triple flexion to plantar stimulation on the left  Pertinent Labs: CSF WBC 2 CSF RBC 2230 CSF protein 46(elevated but accounted for by traumatic contamination) CSF glucose 85  Impression: 88 year old female with new onset left leg weakness with a thoracic spine T2 lesion.  I think possibilities at this point include an inflammatory post-adjuvant myelitis versus spinal cord ischemia.  Given the timeframe, I would favor an inflammatory reaction, though this is not demonstrated with enhancement or pleocytosis.   Diffusion-weighted imaging could be helpful to further differentiate, but is unreliable in the spine and often difficult to interpret.  Though I typically do 1 g daily of Solu-Medrol, given the patient's advanced age I have favored using a reduced dose of 500 mg daily.  Recommendations: 1) diffusion weighted sequences of the thoracic spine 2) Solu-Medrol 500 mg daily  Roland Rack, MD [facility] [phone]  If 7pm- 7am, please page neurology on call as listed in [place].

## 2021-07-10 LAB — COMPREHENSIVE METABOLIC PANEL
ALT: 20 U/L (ref 0–44)
AST: 18 U/L (ref 15–41)
Albumin: 3.8 g/dL (ref 3.5–5.0)
Alkaline Phosphatase: 42 U/L (ref 38–126)
Anion gap: 7 (ref 5–15)
BUN: 28 mg/dL — ABNORMAL HIGH (ref 8–23)
CO2: 26 mmol/L (ref 22–32)
Calcium: 8.9 mg/dL (ref 8.9–10.3)
Chloride: 102 mmol/L (ref 98–111)
Creatinine, Ser: 1.04 mg/dL — ABNORMAL HIGH (ref 0.44–1.00)
GFR, Estimated: 51 mL/min — ABNORMAL LOW (ref >60)
Glucose, Bld: 116 mg/dL — ABNORMAL HIGH (ref 70–99)
Potassium: 3.6 mmol/L (ref 3.5–5.1)
Sodium: 135 mmol/L (ref 135–145)
Total Bilirubin: 0.3 mg/dL (ref 0.3–1.2)
Total Protein: 6.5 g/dL (ref 6.5–8.1)

## 2021-07-10 LAB — PHOSPHORUS: Phosphorus: 3.6 mg/dL (ref 2.5–4.6)

## 2021-07-10 LAB — CBC WITH DIFFERENTIAL/PLATELET
Abs Immature Granulocytes: 0.09 10*3/uL — ABNORMAL HIGH (ref 0.00–0.07)
Basophils Absolute: 0 10*3/uL (ref 0.0–0.1)
Basophils Relative: 0 %
Eosinophils Absolute: 0 10*3/uL (ref 0.0–0.5)
Eosinophils Relative: 0 %
HCT: 39.9 % (ref 36.0–46.0)
Hemoglobin: 13.1 g/dL (ref 12.0–15.0)
Immature Granulocytes: 1 %
Lymphocytes Relative: 9 %
Lymphs Abs: 1.6 10*3/uL (ref 0.7–4.0)
MCH: 29.7 pg (ref 26.0–34.0)
MCHC: 32.8 g/dL (ref 30.0–36.0)
MCV: 90.5 fL (ref 80.0–100.0)
Monocytes Absolute: 1.2 10*3/uL — ABNORMAL HIGH (ref 0.1–1.0)
Monocytes Relative: 7 %
Neutro Abs: 13.9 10*3/uL — ABNORMAL HIGH (ref 1.7–7.7)
Neutrophils Relative %: 83 %
Platelets: 247 10*3/uL (ref 150–400)
RBC: 4.41 MIL/uL (ref 3.87–5.11)
RDW: 13.1 % (ref 11.5–15.5)
WBC: 16.8 10*3/uL — ABNORMAL HIGH (ref 4.0–10.5)
nRBC: 0 % (ref 0.0–0.2)

## 2021-07-10 LAB — MAGNESIUM: Magnesium: 2.4 mg/dL (ref 1.7–2.4)

## 2021-07-10 NOTE — Progress Notes (Signed)
Physical Therapy Treatment Patient Details Name: [name] MRN: [id number] DOB: Nov 16, 1928 Today's Date: 07/10/2021   History of Present Illness 88 yo female admitted with L LE weakness. Negative MRI brain; MRI lumbar spine with moderate levoscoliosis and multilevel lumbar spondylosis, mild to moderate left lateral recess stenosis at L4-L5 could affect the descending L L5 nerve root. 2/10 lumbar puncture at L5-S1. Hx of fibromyalgia, osteoporosis, breast cancer s/p mastectomy, COVID, osteopenia.    PT Comments    Pt with improved ambulation distance this session, able to clear L foot with active dorsiflexion each step and no knee hyperextension noted. Pt performs seated BLE strengthening exercises, able to lift LLE to equal height with LAQ and seated marching, difficulty noted with decreased AROM and speed with SLR on L compared to R. Pt with equal bil dorsiflexion this session, reports muscle spasms less in L leg and improved feeling of strength overall. Pt returns to room ready for sponge bath with nursing, session ended with pt seated up in recliner. Continue to progress as able.   Recommendations for follow up therapy are one component of a multi-disciplinary discharge planning process, led by the attending physician.  Recommendations may be updated based on patient status, additional functional criteria and insurance authorization.  Follow Up Recommendations  Skilled nursing-short term rehab (<3 hours/day) (vs CIR)     Assistance Recommended at Discharge Frequent or constant Supervision/Assistance  Patient can return home with the following A little help with walking and/or transfers;A little help with bathing/dressing/bathroom;Assistance with cooking/housework;Help with stairs or ramp for entrance;Assist for transportation   Equipment Recommendations  Rolling walker (2 wheels) (youth height)    Recommendations for Other Services       Precautions / Restrictions  Precautions Precautions: Fall Restrictions Weight Bearing Restrictions: No     Mobility  Bed Mobility  General bed mobility comments: in recliner    Transfers Overall transfer level: Needs assistance Equipment used: Rolling walker (2 wheels) Transfers: Sit to/from Stand Sit to Stand: Min guard  General transfer comment: VC for hand placement, BUE assisting to power up    Ambulation/Gait Ambulation/Gait assistance: Min guard Gait Distance (Feet): 100 Feet Assistive device: Rolling walker (2 wheels) Gait Pattern/deviations: Step-to pattern, Trunk flexed, Narrow base of support, Decreased stance time - left, Decreased weight shift to left, Step-through pattern Gait velocity: decreased  General Gait Details: narrow BOS with step-through pattern initially, with fatigue pt with step to pattern decreasing weight-bearing and stance time on LLE, no L knee hyperextension noted today, possible trendelenburg though pt reports "weakness" feeling more in lower leg, able to dorsiflex L foot with each step; mild LOB posterior when releasing BUE to reposition face mask requiring min A to recover   Stairs             Wheelchair Mobility    Modified Rankin (Stroke Patients Only)       Balance Overall balance assessment: Needs assistance Sitting-balance support: No upper extremity supported Sitting balance-Leahy Scale: Good  Standing balance support: Single extremity supported, Bilateral upper extremity supported, Reliant on assistive device for balance Standing balance-Leahy Scale: Poor       Cognition Arousal/Alertness: Awake/alert Behavior During Therapy: WFL for tasks assessed/performed Overall Cognitive Status: Within Functional Limits for tasks assessed     Exercises General Exercises - Lower Extremity Ankle Circles/Pumps: Seated, AROM, Both, 10 reps Long Arc Quad: Seated, AROM, Strengthening, Both, 10 reps Straight Leg Raises: Seated, AROM, Strengthening, Both, 10  reps Hip Flexion/Marching: Seated, AROM, Strengthening,  Both, 10 reps    General Comments        Pertinent Vitals/Pain Pain Assessment Pain Assessment: Faces Faces Pain Scale: Hurts a little bit Pain Location: L LE Pain Descriptors / Indicators: Spasm Pain Intervention(s): Limited activity within patient's tolerance, Monitored during session    Home Living                          Prior Function            PT Goals (current goals can now be found in the care plan section) Acute Rehab PT Goals Patient Stated Goal: to regain independence/PLOF PT Goal Formulation: With patient Time For Goal Achievement: 07/20/21 Potential to Achieve Goals: Good Progress towards PT goals: Progressing toward goals    Frequency    Min 3X/week      PT Plan Current plan remains appropriate    Co-evaluation              AM-PAC PT "6 Clicks" Mobility   Outcome Measure  Help needed turning from your back to your side while in a flat bed without using bedrails?: None Help needed moving from lying on your back to sitting on the side of a flat bed without using bedrails?: A Little Help needed moving to and from a bed to a chair (including a wheelchair)?: A Little Help needed standing up from a chair using your arms (e.g., wheelchair or bedside chair)?: A Little Help needed to walk in hospital room?: A Little Help needed climbing 3-5 steps with a railing? : A Lot 6 Click Score: 18    End of Session Equipment Utilized During Treatment: Gait belt Activity Tolerance: Patient tolerated treatment well Patient left: in chair;with call bell/phone within reach Nurse Communication: Mobility status PT Visit Diagnosis: Muscle weakness (generalized) (M62.81);Difficulty in walking, not elsewhere classified (R26.2);Other abnormalities of gait and mobility (R26.89)     Time: 8828-0034 PT Time Calculation (min) (ACUTE ONLY): 14 min  Charges:  $Gait Training: 8-22 mins                       Tori Zyairah Wacha PT, DPT 07/10/21, 10:38 AM

## 2021-07-10 NOTE — Progress Notes (Signed)
PROGRESS NOTE    [name]  NWG:[id number] DOB: [birth year] DOA: 07/05/2021 PCP: Crist Infante, MD  Chief Complaint  Patient presents with   Weakness    Brief Narrative:  88 yo with hx breast cancer, HLD, HTN, diet controlled diabetes who presented on 2/7 with generalized weakness and progressive difficulty walking.  She noted progressive LLE weakness and presented to the ED.  She's been admitted for additional workup of her generalized and left lower extremity weakness.    Assessment & Plan:   Principal Problem:   Left leg weakness Active Problems:   Spasm   HTN (hypertension)   Type II diabetes mellitus with renal manifestations (HCC)   Hypercholesterolemia   Anxiety   COVID-19 vaccine administered   History of breast cancer   GERD (gastroesophageal reflux disease)   Lower extremity weakness   Assessment and Plan: * Left leg weakness- (present on admission) New LLE weakness Previously independent Negative MRI brain MRI lumbar spine with moderate levoscoliosis and multilevel lumbar spondylosis as described above, mild to moderate left lateral recess stenosis at L4-L5 could affect the descending L L5 nerve root C spine MRI with moderate L neuroforaminal stenosis at C6-7, erosive change of dens suggestive of CPPD T spine MRI with short segment patchy non expansile non compressive increased T2 signal in anterior cord at T6 without enhancement Repeat MRI with restricted diffusion at site of ventral cord T2 signal abnormality running length of T6 vertebral body - spinal cord infract vs demyelinating disease? LP for cells (2 WBCs, 2,230 RBC, mildly elevated protein,  Glucose elevated - follow oligoclonal bands - pending, IgG index pending collection)   IV steroids x5 days (500 mg daily) Appreciate neurology assistance Mild improvement noted by her today again   Spasm Describes jerks and spasms of LLE Follow MRI as above, consider additional w/u CK wnl Appreciate neurology    Type II diabetes mellitus with renal manifestations ([place])- (present on admission) Follow BG's Diet controlled Follow A1c (6.3)   HTN (hypertension)- (present on admission) Metoprolol, amlodipine  Anxiety- (present on admission) xanax  Hypercholesterolemia- (present on admission) zetia  COVID-19 vaccine administered S/ p booster on 2/4    DVT prophylaxis: lovenox Code Status: DNR Family Communication: none Disposition:   Status is: Observation The patient will require care spanning > 2 midnights and should be moved to inpatient because: continued LLE weakness requiring neurology c/s    Consultants:  neurology  Procedures:  LP 2/10  Antimicrobials:  Anti-infectives (From admission, onward)    None      Subjective: Gradually better  Objective: Vitals:   07/09/21 0339 07/09/21 1345 07/10/21 0441 07/10/21 1415  BP: (!) 148/83 139/71 125/71 (!) 142/72  Pulse: (!) 107 93 89 (!) 104  Resp: 17 14 16 18   Temp: 97.6 F (36.4 C) 98.3 F (36.8 C) 97.9 F (36.6 C) 98.2 F (36.8 C)  TempSrc: Oral Oral Oral Oral  SpO2: 97% 99% 98% 96%  Height:        Intake/Output Summary (Last 24 hours) at 07/10/2021 1723 Last data filed at 07/10/2021 1300 Gross per 24 hour  Intake 598 ml  Output --  Net 598 ml   There were no vitals filed for this visit.  Examination:  General: No acute distress. Cardiovascular: RRR Lungs: unlabored Abdomen: Soft, nontender, nondistended  Neurological: LLE weakness, 4/5  Skin: Warm and dry. No rashes or lesions. Extremities: No clubbing or cyanosis. No edema.   Data Reviewed: I have personally reviewed following labs  and imaging studies  CBC: Recent Labs  Lab 07/05/21 1910 07/06/21 0350 07/07/21 0502 07/08/21 0542 07/10/21 0607  WBC 11.0* 10.5 8.3 9.5 16.8*  NEUTROABS  --   --  5.1 6.1 13.9*  HGB 12.4 12.7 12.9 13.4 13.1  HCT 38.0 39.1 38.9 41.2 39.9  MCV 90.5 92.7 91.3 91.2 90.5  PLT 235 214 218 172 247    Basic  Metabolic Panel: Recent Labs  Lab 07/05/21 1134 07/05/21 1910 07/06/21 0350 07/07/21 0502 07/08/21 0542 07/10/21 0607  NA 136  --  135 136 135 135  K 4.1  --  3.2* 4.3 3.7 3.6  CL 103  --  101 103 103 102  CO2 25  --  23 27 24 26   GLUCOSE 202*  --  236* 106* 104* 116*  BUN 18  --  16 19 22  28*  CREATININE 0.78 0.69 0.94 0.93 0.67 1.04*  CALCIUM 9.2  --  8.8* 8.7* 8.9 8.9  MG  --   --  2.1 2.2 2.2 2.4  PHOS  --   --  3.3 3.6 3.8 3.6    GFR: CrCl cannot be calculated (Unknown ideal weight.).  Liver Function Tests: Recent Labs  Lab 07/05/21 1134 07/06/21 0350 07/07/21 0502 07/08/21 0542 07/10/21 0607  AST 20 22 17 18 18   ALT 19 17 16 17 20   ALKPHOS 46 42 40 38 42  BILITOT 0.1* 0.5 0.3 0.3 0.3  PROT 7.1 6.1* 6.3* 6.1* 6.5  ALBUMIN 4.0 3.5 3.4* 3.5 3.8    CBG: No results for input(s): GLUCAP in the last 168 hours.   Recent Results (from the past 240 hour(s))  Resp Panel by RT-PCR (Flu Hilarie Sinha&B, Covid) Nasopharyngeal Swab     Status: None   Collection Time: 07/05/21 11:34 AM   Specimen: Nasopharyngeal Swab; Nasopharyngeal(NP) swabs in vial transport medium  Result Value Ref Range Status   SARS Coronavirus 2 by RT PCR NEGATIVE NEGATIVE Final    Comment: (NOTE) SARS-CoV-2 target nucleic acids are NOT DETECTED.  The SARS-CoV-2 RNA is generally detectable in upper respiratory specimens during the acute phase of infection. The lowest concentration of SARS-CoV-2 viral copies this assay can detect is 138 copies/mL. Jaxxon Naeem negative result does not preclude SARS-Cov-2 infection and should not be used as the sole basis for treatment or other patient management decisions. Carl Butner negative result may occur with  improper specimen collection/handling, submission of specimen other than nasopharyngeal swab, presence of viral mutation(s) within the areas targeted by this assay, and inadequate number of viral copies(<138 copies/mL). Dillinger Aston negative result must be combined with clinical  observations, patient history, and epidemiological information. The expected result is Negative.  Fact Sheet for Patients:  EntrepreneurPulse.com.au  Fact Sheet for Healthcare Providers:  IncredibleEmployment.be  This test is no t yet approved or cleared by the [place] FDA and  has been authorized for detection and/or diagnosis of SARS-CoV-2 by FDA under an Emergency Use Authorization (EUA). This EUA will remain  in effect (meaning this test can be used) for the duration of the COVID-19 declaration under Section 564(b)(1) of the Act, 21 U.S.C.section 360bbb-3(b)(1), unless the authorization is terminated  or revoked sooner.       Influenza Angelin Cutrone by PCR NEGATIVE NEGATIVE Final   Influenza B by PCR NEGATIVE NEGATIVE Final    Comment: (NOTE) The Xpert Xpress SARS-CoV-2/FLU/RSV plus assay is intended as an aid in the diagnosis of influenza from Nasopharyngeal swab specimens and should not be used as Latanza Pfefferkorn sole basis  for treatment. Nasal washings and aspirates are unacceptable for Xpert Xpress SARS-CoV-2/FLU/RSV testing.  Fact Sheet for Patients: EntrepreneurPulse.com.au  Fact Sheet for Healthcare Providers: IncredibleEmployment.be  This test is not yet approved or cleared by the [place] FDA and has been authorized for detection and/or diagnosis of SARS-CoV-2 by FDA under an Emergency Use Authorization (EUA). This EUA will remain in effect (meaning this test can be used) for the duration of the COVID-19 declaration under Section 564(b)(1) of the Act, 21 U.S.C. section 360bbb-3(b)(1), unless the authorization is terminated or revoked.  Performed at [facility], [place] [address], [place], [place] [address]   CSF culture w Gram Stain     Status: None (Preliminary result)   Collection Time: 07/08/21  2:10 PM   Specimen: PATH Cytology CSF; Cerebrospinal Fluid  Result Value Ref Range  Status   Specimen Description   Final    CSF Performed at [place] [address] [place], [place] [address]    Special Requests   Final    NONE Performed at [facility], [place] [address], [place], [place] [address]    Gram Stain   Final    WBC PRESENT,BOTH PMN AND MONONUCLEAR NO ORGANISMS SEEN CYTOSPIN SMEAR Gram Stain Report Called to,Read Back By and Verified With: GUIRAL,J ON 07/08/2021 AT 1819 BY LUZOLOP GRAM STAIN REVIEWED-AGREE WITH RESULT    Culture   Final    NO GROWTH 2 DAYS Performed at [facility], [place] [address], [place],  [address]    Report Status PENDING  Incomplete         Radiology Studies: MR THORACIC SPINE WO CONTRAST  Result Date: 07/09/2021 CLINICAL DATA:  Myelopathy, acute, thoracic spine. Progressive left leg weakness. EXAM: MRI THORACIC SPINE WITHOUT CONTRAST TECHNIQUE: Multiplanar, multisequence MR imaging of the thoracic spine was performed. No intravenous contrast was administered. COMPARISON:  07/07/2021 FINDINGS: The study was performed specifically to achieve diffusion imaging. The study confirms restricted diffusion in the anterior cord at the T6 level running the length of the T6 vertebral body. Restricted diffusion could indicate either spinal cord infarction or acute demyelinating disease. In David Rodriquez patient of this age, spinal cord infarction is cyst distantly much more likely. IMPRESSION: Restricted diffusion at the site of the ventral cord T2 signal abnormality running the length of the T6 vertebral body. This appearance could be seen with spinal cord infarction or demyelinating disease. Spinal cord infarction would be more common in Sinead Hockman person of this age. Electronically Signed   By: Nelson Chimes M.D.   On: 07/09/2021 20:49        Scheduled Meds:  amLODipine  2.5 mg Oral Daily   aspirin  325 mg Oral Daily   docusate sodium  100 mg Oral BID   ezetimibe  10 mg Oral Daily   metoprolol  succinate  12.5 mg Oral Daily   pantoprazole  40 mg Oral Daily   psyllium  1 packet Oral Daily   Continuous Infusions:  methylPREDNISolone (SOLU-MEDROL) injection 500 mg (07/10/21 1015)     LOS: 3 days    Time spent: over 30 min    Fayrene Helper, MD [facility]   To contact the attending provider between 7A-7P or the covering provider during after hours 7P-7A, please log into the web site [email or web address] and access using universal [place] password for that web site. If you do not have the password, please call the hospital operator.  07/10/2021, 5:23 PM

## 2021-07-11 ENCOUNTER — Inpatient Hospital Stay (HOSPITAL_COMMUNITY): Payer: Medicare Other

## 2021-07-11 DIAGNOSIS — I342 Nonrheumatic mitral (valve) stenosis: Secondary | ICD-10-CM

## 2021-07-11 DIAGNOSIS — I350 Nonrheumatic aortic (valve) stenosis: Secondary | ICD-10-CM

## 2021-07-11 LAB — ECHOCARDIOGRAM COMPLETE
AR max vel: 1.57 cm2
AV Area VTI: 1.52 cm2
AV Area mean vel: 1.39 cm2
AV Mean grad: 4 mmHg
AV Peak grad: 6.5 mmHg
Ao pk vel: 1.27 m/s
Area-P 1/2: 3.89 cm2
Calc EF: 80.7 %
Height: 58 in
MV VTI: 1.22 cm2
S' Lateral: 1.2 cm
Single Plane A2C EF: 80.1 %
Single Plane A4C EF: 80.8 %

## 2021-07-11 LAB — CBC
HCT: 39.9 % (ref 36.0–46.0)
Hemoglobin: 13.2 g/dL (ref 12.0–15.0)
MCH: 29.7 pg (ref 26.0–34.0)
MCHC: 33.1 g/dL (ref 30.0–36.0)
MCV: 89.7 fL (ref 80.0–100.0)
Platelets: 198 10*3/uL (ref 150–400)
RBC: 4.45 MIL/uL (ref 3.87–5.11)
RDW: 13.2 % (ref 11.5–15.5)
WBC: 15.3 10*3/uL — ABNORMAL HIGH (ref 4.0–10.5)
nRBC: 0 % (ref 0.0–0.2)

## 2021-07-11 LAB — BASIC METABOLIC PANEL
Anion gap: 7 (ref 5–15)
BUN: 28 mg/dL — ABNORMAL HIGH (ref 8–23)
CO2: 25 mmol/L (ref 22–32)
Calcium: 9 mg/dL (ref 8.9–10.3)
Chloride: 105 mmol/L (ref 98–111)
Creatinine, Ser: 0.7 mg/dL (ref 0.44–1.00)
GFR, Estimated: >60 mL/min (ref >60)
Glucose, Bld: 113 mg/dL — ABNORMAL HIGH (ref 70–99)
Potassium: 4.6 mmol/L (ref 3.5–5.1)
Sodium: 137 mmol/L (ref 135–145)

## 2021-07-11 LAB — HEMOGLOBIN A1C
Hgb A1c MFr Bld: 6.4 % — ABNORMAL HIGH (ref 4.8–5.6)
Mean Plasma Glucose: 136.98 mg/dL

## 2021-07-11 LAB — LIPID PANEL
Cholesterol: 232 mg/dL — ABNORMAL HIGH (ref 0–200)
HDL: 56 mg/dL (ref >40)
LDL Cholesterol: 139 mg/dL — ABNORMAL HIGH (ref 0–99)
Total CHOL/HDL Ratio: 4.1 RATIO
Triglycerides: 185 mg/dL — ABNORMAL HIGH (ref <150)
VLDL: 37 mg/dL (ref 0–40)

## 2021-07-11 NOTE — Progress Notes (Signed)
Physical Therapy Treatment Patient Details Name: [name] MRN: [id number] DOB: March 29, 1929 Today's Date: 07/11/2021   History of Present Illness 88 yo female admitted with L LE weakness. Negative MRI brain; MRI lumbar spine with moderate levoscoliosis and multilevel lumbar spondylosis, mild to moderate left lateral recess stenosis at L4-L5 could affect the descending L L5 nerve root. 2/10 lumbar puncture at L5-S1. Hx of fibromyalgia, osteoporosis, breast cancer s/p mastectomy, COVID, osteopenia.    PT Comments    Pt assisted with performing LE exercises and ambulating in hallway.  Pt reports Lt LE strength improved today.   Recommendations for follow up therapy are one component of a multi-disciplinary discharge planning process, led by the attending physician.  Recommendations may be updated based on patient status, additional functional criteria and insurance authorization.  Follow Up Recommendations  Skilled nursing-short term rehab (<3 hours/day)     Assistance Recommended at Discharge Frequent or constant Supervision/Assistance  Patient can return home with the following A little help with walking and/or transfers;A little help with bathing/dressing/bathroom;Assistance with cooking/housework;Help with stairs or ramp for entrance;Assist for transportation   Equipment Recommendations  Rolling walker (2 wheels) (youth)    Recommendations for Other Services       Precautions / Restrictions Precautions Precautions: Fall     Mobility  Bed Mobility               General bed mobility comments: pt in recliner    Transfers Overall transfer level: Needs assistance Equipment used: Rolling walker (2 wheels) Transfers: Sit to/from Stand Sit to Stand: Min assist           General transfer comment: verbal cues for hand placement, light assist to rise    Ambulation/Gait Ambulation/Gait assistance: Min guard Gait Distance (Feet): 120 Feet Assistive device: Rolling  walker (2 wheels) Gait Pattern/deviations: Trunk flexed, Step-through pattern Gait velocity: decreased     General Gait Details: min/guard for safety, pt reports moving Lt LE better today; with fatigue observed less control of Lt LE however pt able to perform small heel strike, distance to tolerance   Stairs             Wheelchair Mobility    Modified Rankin (Stroke Patients Only)       Balance                                            Cognition Arousal/Alertness: Awake/alert Behavior During Therapy: WFL for tasks assessed/performed Overall Cognitive Status: Within Functional Limits for tasks assessed                                          Exercises General Exercises - Lower Extremity Ankle Circles/Pumps: Seated, AROM, Both, 10 reps Long Arc Quad: Seated, AROM, Strengthening, Both, 10 reps Heel Slides: AROM, Both, 10 reps, Supine Hip ABduction/ADduction: AROM, Strengthening, Both, Supine, 10 reps Hip Flexion/Marching: Seated, AROM, Strengthening, Both, 10 reps    General Comments        Pertinent Vitals/Pain Pain Assessment Pain Assessment: No/denies pain    Home Living                          Prior Function  PT Goals (current goals can now be found in the care plan section) Progress towards PT goals: Progressing toward goals    Frequency    Min 3X/week      PT Plan Current plan remains appropriate    Co-evaluation              AM-PAC PT "6 Clicks" Mobility   Outcome Measure  Help needed turning from your back to your side while in a flat bed without using bedrails?: None Help needed moving from lying on your back to sitting on the side of a flat bed without using bedrails?: A Little Help needed moving to and from a bed to a chair (including a wheelchair)?: A Little Help needed standing up from a chair using your arms (e.g., wheelchair or bedside chair)?: A Little Help  needed to walk in hospital room?: A Little Help needed climbing 3-5 steps with a railing? : A Lot 6 Click Score: 18    End of Session Equipment Utilized During Treatment: Gait belt Activity Tolerance: Patient tolerated treatment well Patient left: in chair;with call bell/phone within reach Nurse Communication: Mobility status PT Visit Diagnosis: Muscle weakness (generalized) (M62.81);Difficulty in walking, not elsewhere classified (R26.2)     Time: 9326-7124 PT Time Calculation (min) (ACUTE ONLY): 19 min  Charges:  $Gait Training: 8-22 mins                     Jannette Spanner PT, DPT Acute Rehabilitation Services Pager: [phone] Office: [place] 07/11/2021, 4:26 PM

## 2021-07-11 NOTE — Progress Notes (Signed)
Inpatient Rehab Admissions Coordinator:   Consult received and chart reviewed.  Note that therapy recommending SNF and MRI negative for acute process.  [facility] Medicare will not approve CIR for this patient at this time.  Will sign off.    Shann Medal, PT, DPT Admissions Coordinator [phone] 07/11/21  9:37 AM

## 2021-07-11 NOTE — Progress Notes (Signed)
PROGRESS NOTE    [name]  GYJ:[id number] DOB: 03-31-1929 DOA: 07/05/2021 PCP: Leslie Infante, MD  Chief Complaint  Patient presents with   Weakness    Brief Narrative:  88 yo with hx breast cancer, HLD, HTN, diet controlled diabetes who presented on 2/7 with generalized weakness and progressive difficulty walking.  She noted progressive LLE weakness and presented to the ED.  She's been admitted for additional workup of her generalized and left lower extremity weakness.    Assessment & Plan:   Principal Problem:   Left leg weakness Active Problems:   Spasm   HTN (hypertension)   Type II diabetes mellitus with renal manifestations (HCC)   Hypercholesterolemia   Anxiety   COVID-19 vaccine administered   History of breast cancer   GERD (gastroesophageal reflux disease)   Lower extremity weakness   Assessment and Plan: * Left leg weakness- (present on admission) New LLE weakness Previously independent Negative MRI brain MRI lumbar spine with moderate levoscoliosis and multilevel lumbar spondylosis as described above, mild to moderate left lateral recess stenosis at L4-L5 could affect the descending L L5 nerve root C spine MRI with moderate L neuroforaminal stenosis at C6-7, erosive change of dens suggestive of CPPD T spine MRI with short segment patchy non expansile non compressive increased T2 signal in anterior cord at T6 without enhancement Repeat MRI with restricted diffusion at site of ventral cord T2 signal abnormality running length of T6 vertebral body - spinal cord infract vs demyelinating disease? LP for cells (2 WBCs, 2,230 RBC, mildly elevated protein,  Glucose elevated - follow oligoclonal bands - pending, IgG index pending collection)   IV steroids x5 days (500 mg daily) Appreciate neurology assistance - discussed with neurology, recommending echo (EF 70-76%, severe asymmetric L ventricular hypertrophy of basal septal segment, grade II diastolic dysfunction,  elevated LA pressure - see report) and ASA Will follow with neurology Mild improvement noted by her today again   Spasm Describes jerks and spasms of LLE Follow MRI as above, consider additional w/u CK wnl Appreciate neurology   Type II diabetes mellitus with renal manifestations ([place])- (present on admission) Follow BG's Diet controlled Follow A1c (6.3)   HTN (hypertension)- (present on admission) Metoprolol, amlodipine  Anxiety- (present on admission) xanax  Hypercholesterolemia- (present on admission) zetia  COVID-19 vaccine administered S/ p booster on 2/4    DVT prophylaxis: lovenox Code Status: DNR Family Communication: none Disposition:   Status is: Observation The patient will require care spanning > 2 midnights and should be moved to inpatient because: continued LLE weakness requiring neurology c/s    Consultants:  neurology  Procedures:  LP 2/10  Antimicrobials:  Anti-infectives (From admission, onward)    None      Subjective: Says she's gradually better Is hopeful to go home  Objective: Vitals:   07/10/21 2049 07/11/21 0523 07/11/21 1334 07/11/21 1942  BP: 125/61 135/73 117/71 (!) 146/77  Pulse: 90 80 99 97  Resp: 16 18 16 14   Temp: 99.8 F (37.7 C) 97.8 F (36.6 C) 98 F (36.7 C) 98.2 F (36.8 C)  TempSrc: Oral Oral Oral Oral  SpO2: 96% 95% 95% 97%  Height:        Intake/Output Summary (Last 24 hours) at 07/11/2021 2016 Last data filed at 07/11/2021 1307 Gross per 24 hour  Intake 600 ml  Output --  Net 600 ml   There were no vitals filed for this visit.  Examination:  General: No acute distress. Cardiovascular: RRR  Lungs: unlabored Neurological: 4/5 LLE Skin: Warm and dry. No rashes or lesions. Extremities: No clubbing or cyanosis. No edema.  Data Reviewed: I have personally reviewed following labs and imaging studies  CBC: Recent Labs  Lab 07/06/21 0350 07/07/21 0502 07/08/21 0542 07/10/21 0607 07/11/21 0455   WBC 10.5 8.3 9.5 16.8* 15.3*  NEUTROABS  --  5.1 6.1 13.9*  --   HGB 12.7 12.9 13.4 13.1 13.2  HCT 39.1 38.9 41.2 39.9 39.9  MCV 92.7 91.3 91.2 90.5 89.7  PLT 214 218 172 247 947    Basic Metabolic Panel: Recent Labs  Lab 07/06/21 0350 07/07/21 0502 07/08/21 0542 07/10/21 0607 07/11/21 0455  NA 135 136 135 135 137  K 3.2* 4.3 3.7 3.6 4.6  CL 101 103 103 102 105  CO2 23 27 24 26 25   GLUCOSE 236* 106* 104* 116* 113*  BUN 16 19 22  28* 28*  CREATININE 0.94 0.93 0.67 1.04* 0.70  CALCIUM 8.8* 8.7* 8.9 8.9 9.0  MG 2.1 2.2 2.2 2.4  --   PHOS 3.3 3.6 3.8 3.6  --     GFR: CrCl cannot be calculated (Unknown ideal weight.).  Liver Function Tests: Recent Labs  Lab 07/05/21 1134 07/06/21 0350 07/07/21 0502 07/08/21 0542 07/10/21 0607  AST 20 22 17 18 18   ALT 19 17 16 17 20   ALKPHOS 46 42 40 38 42  BILITOT 0.1* 0.5 0.3 0.3 0.3  PROT 7.1 6.1* 6.3* 6.1* 6.5  ALBUMIN 4.0 3.5 3.4* 3.5 3.8    CBG: No results for input(s): GLUCAP in the last 168 hours.   Recent Results (from the past 240 hour(s))  Resp Panel by RT-PCR (Flu [name]&B, Covid) Nasopharyngeal Swab     Status: None   Collection Time: 07/05/21 11:34 AM   Specimen: Nasopharyngeal Swab; Nasopharyngeal(NP) swabs in vial transport medium  Result Value Ref Range Status   SARS Coronavirus 2 by RT PCR NEGATIVE NEGATIVE Final    Comment: (NOTE) SARS-CoV-2 target nucleic acids are NOT DETECTED.  The SARS-CoV-2 RNA is generally detectable in upper respiratory specimens during the acute phase of infection. The lowest concentration of SARS-CoV-2 viral copies this assay can detect is 138 copies/mL. [name] negative result does not preclude SARS-Cov-2 infection and should not be used as the sole basis for treatment or other patient management decisions. [name] negative result may occur with  improper specimen collection/handling, submission of specimen other than nasopharyngeal swab, presence of viral mutation(s) within the areas  targeted by this assay, and inadequate number of viral copies(<138 copies/mL). [name] negative result must be combined with clinical observations, patient history, and epidemiological information. The expected result is Negative.  Fact Sheet for Patients:  EntrepreneurPulse.com.au  Fact Sheet for Healthcare Providers:  IncredibleEmployment.be  This test is no t yet approved or cleared by the [place] FDA and  has been authorized for detection and/or diagnosis of SARS-CoV-2 by FDA under an Emergency Use Authorization (EUA). This EUA will remain  in effect (meaning this test can be used) for the duration of the COVID-19 declaration under Section 564(b)(1) of the Act, 21 U.S.C.section 360bbb-3(b)(1), unless the authorization is terminated  or revoked sooner.       Influenza Macaela Presas by PCR NEGATIVE NEGATIVE Final   Influenza B by PCR NEGATIVE NEGATIVE Final    Comment: (NOTE) The Xpert Xpress SARS-CoV-2/FLU/RSV plus assay is intended as an aid in the diagnosis of influenza from Nasopharyngeal swab specimens and should not be used as Danniella Robben sole basis for  treatment. Nasal washings and aspirates are unacceptable for Xpert Xpress SARS-CoV-2/FLU/RSV testing.  Fact Sheet for Patients: EntrepreneurPulse.com.au  Fact Sheet for Healthcare Providers: IncredibleEmployment.be  This test is not yet approved or cleared by the [place] FDA and has been authorized for detection and/or diagnosis of SARS-CoV-2 by FDA under an Emergency Use Authorization (EUA). This EUA will remain in effect (meaning this test can be used) for the duration of the COVID-19 declaration under Section 564(b)(1) of the Act, 21 U.S.C. section 360bbb-3(b)(1), unless the authorization is terminated or revoked.  Performed at [facility], [place] [address], [place], [place] [address]   CSF culture w Gram Stain     Status: None  (Preliminary result)   Collection Time: 07/08/21  2:10 PM   Specimen: PATH Cytology CSF; Cerebrospinal Fluid  Result Value Ref Range Status   Specimen Description   Final    CSF Performed at [facility] [address] [place], [place] [address]    Special Requests   Final    NONE Performed at [facility], [place] [address], [place], [place] [address]    Gram Stain   Final    WBC PRESENT,BOTH PMN AND MONONUCLEAR NO ORGANISMS SEEN CYTOSPIN SMEAR Gram Stain Report Called to,Read Back By and Verified With: GUIRAL,J ON 07/08/2021 AT 1819 BY LUZOLOP GRAM STAIN REVIEWED-AGREE WITH RESULT    Culture   Final    NO GROWTH 3 DAYS Performed at [facility], [place] [address], [place], [place] [address]    Report Status PENDING  Incomplete         Radiology Studies: ECHOCARDIOGRAM COMPLETE  Result Date: 07/11/2021    ECHOCARDIOGRAM REPORT   Patient Name:   Arianne Chontel Warning Dittmer Date of Exam: 07/11/2021 Medical Rec #:  [id number]    Height:       58.0 in Accession #:    [id number]   Weight:       90.0 lb Date of Birth:  06-06-1928    BSA:          1.297 m Patient Age:    27 years     BP:           130/73 mmHg Patient Gender: F            HR:           92 bpm. Exam Location:  Inpatient Procedure: 2D Echo, Cardiac Doppler and Color Doppler Indications:    Lower extremity weakness  History:        Patient has prior history of Echocardiogram examinations, most                 recent 04/13/2015. Risk Factors:Dyslipidemia and Hypertension.  Sonographer:    Luisa Hart RDCS Referring Phys: [phone] Tyquasia Pant CALDWELL POWELL [place]  1. Left ventricular ejection fraction, by estimation, is 70 to 75%. The left ventricle has hyperdynamic function. The left ventricle has no regional wall motion abnormalities. There is severe asymmetric left ventricular hypertrophy of the basal-septal segment. Left ventricular diastolic parameters are consistent with Grade II diastolic dysfunction  (pseudonormalization). Elevated left atrial pressure.  2. Right ventricular systolic function is normal. The right ventricular size is normal. There is mildly elevated pulmonary artery systolic pressure. The estimated right ventricular systolic pressure is 62.7 mmHg.  3. The mitral valve is degenerative. Trivial mitral valve regurgitation. Moderate mitral stenosis. MG 1mmg at 85bpm, MVA 1.1 cm^2 by continuity equation. Severe mitral annular calcification.  4. The aortic valve is tricuspid. Aortic valve regurgitation is not visualized. Aortic valve sclerosis is present, with no evidence of aortic valve stenosis.  5. The inferior vena cava is normal in size with greater than 50% respiratory variability, suggesting right atrial pressure of 3 mmHg. FINDINGS  Left Ventricle: Left ventricular ejection fraction, by estimation, is 70 to 75%. The left ventricle has hyperdynamic function. The left ventricle has no regional wall motion abnormalities. The left ventricular internal cavity size was small. There is severe asymmetric left ventricular hypertrophy of the basal-septal segment. Left ventricular diastolic parameters are consistent with Grade II diastolic dysfunction (pseudonormalization). Elevated left atrial pressure. Right Ventricle: The right ventricular size is normal. No increase in right ventricular wall thickness. Right ventricular systolic function is normal. There is mildly elevated pulmonary artery systolic pressure. The tricuspid regurgitant velocity is 3.07  m/s, and with an assumed right atrial pressure of 3 mmHg, the estimated right ventricular systolic pressure is 52.8 mmHg. Left Atrium: Left atrial size was normal in size. Right Atrium: Right atrial size was normal in size. Pericardium: There is no evidence of pericardial effusion. Mitral Valve: The mitral valve is degenerative in appearance. Severe mitral annular calcification. Trivial mitral valve regurgitation. Moderate mitral valve stenosis. MV peak  gradient, 13.0 mmHg. The mean mitral valve gradient is 4.5 mmHg. Tricuspid Valve: The tricuspid valve is normal in structure. Tricuspid valve regurgitation is trivial. Aortic Valve: The aortic valve is tricuspid. Aortic valve regurgitation is not visualized. Aortic valve sclerosis is present, with no evidence of aortic valve stenosis. Aortic valve mean gradient measures 4.0 mmHg. Aortic valve peak gradient measures 6.5  mmHg. Aortic valve area, by VTI measures 1.52 cm. Pulmonic Valve: The pulmonic valve was grossly normal. Pulmonic valve regurgitation is trivial. Aorta: The aortic root and ascending aorta are structurally normal, with no evidence of dilitation. Venous: The inferior vena cava is normal in size with greater than 50% respiratory variability, suggesting right atrial pressure of 3 mmHg. IAS/Shunts: No atrial level shunt detected by color flow Doppler.  LEFT VENTRICLE PLAX 2D LVIDd:         3.00 cm     Diastology LVIDs:         1.20 cm     LV e' medial:    4.24 cm/s LV PW:         1.00 cm     LV E/e' medial:  25.7 LV IVS:        1.60 cm     LV e' lateral:   4.35 cm/s LVOT diam:     1.50 cm     LV E/e' lateral: 25.1 LV SV:         39 LV SV Index:   30 LVOT Area:     1.77 cm  LV Volumes (MOD) LV vol d, MOD A2C: 26.0 ml LV vol d, MOD A4C: 26.1 ml LV vol s, MOD A2C: 5.2 ml LV vol s, MOD A4C: 5.0 ml LV SV MOD A2C:     20.8 ml LV SV MOD A4C:     26.1 ml LV SV MOD BP:      21.7 ml RIGHT VENTRICLE RV Basal diam:  1.90 cm RV Mid diam:    1.70 cm TAPSE (M-mode): 1.8 cm LEFT ATRIUM             Index        RIGHT ATRIUM          Index LA diam:  2.20 cm 1.70 cm/m   RA Area:     9.98 cm LA Vol (A2C):   21.0 ml 16.19 ml/m  RA Volume:   16.10 ml 12.41 ml/m LA Vol (A4C):   27.2 ml 20.97 ml/m LA Biplane Vol: 24.2 ml 18.66 ml/m  AORTIC VALVE                    PULMONIC VALVE AV Area (Vmax):    1.57 cm     PV Vmax:       1.25 m/s AV Area (Vmean):   1.39 cm     PV Vmean:      86.300 cm/s AV Area (VTI):      1.52 cm     PV VTI:        0.285 m AV Vmax:           127.00 cm/s  PV Peak grad:  6.2 mmHg AV Vmean:          89.700 cm/s  PV Mean grad:  3.0 mmHg AV VTI:            0.254 m AV Peak Grad:      6.5 mmHg AV Mean Grad:      4.0 mmHg LVOT Vmax:         113.00 cm/s LVOT Vmean:        70.800 cm/s LVOT VTI:          0.218 m LVOT/AV VTI ratio: 0.86  AORTA Ao Asc diam: 2.70 cm MITRAL VALVE                TRICUSPID VALVE MV Area (PHT): 3.89 cm     TR Peak grad:   37.7 mmHg MV Area VTI:   1.22 cm     TR Vmax:        307.00 cm/s MV Peak grad:  13.0 mmHg MV Mean grad:  4.5 mmHg     SHUNTS MV Vmax:       1.81 m/s     Systemic VTI:  0.22 m MV Vmean:      94.0 cm/s    Systemic Diam: 1.50 cm MV Decel Time: 195 msec MV E velocity: 109.00 cm/s MV Edgar Corrigan velocity: 177.00 cm/s MV E/Jyl [place] ratio:  0.62 Oswaldo Milian MD Electronically signed by Oswaldo Milian MD Signature Date/Time: 07/11/2021/6:38:16 PM    Final         Scheduled Meds:  amLODipine  2.5 mg Oral Daily   aspirin  325 mg Oral Daily   docusate sodium  100 mg Oral BID   ezetimibe  10 mg Oral Daily   metoprolol succinate  12.5 mg Oral Daily   pantoprazole  40 mg Oral Daily   psyllium  1 packet Oral Daily   Continuous Infusions:  methylPREDNISolone (SOLU-MEDROL) injection 500 mg (07/11/21 1042)     LOS: 4 days    Time spent: over 30 min    Fayrene Helper, MD [facility]   To contact the attending provider between 7A-7P or the covering provider during after hours 7P-7A, please log into the web site [email or web address] and access using universal [place] password for that web site. If you do not have the password, please call the hospital operator.  07/11/2021, 8:16 PM

## 2021-07-11 NOTE — TOC Progression Note (Signed)
Transition of Care [facility]) - Progression Note    Patient Details  Name: TEOLA FELIPE MRN: [id number] Date of Birth: 12/28/1928  [facility] of [facility] CM/SW Contact  Aeson Sawyers, Marjie Skiff, RN Phone Number: 07/11/2021, 2:00 PM  Clinical Narrative:    Spoke with pt at bedside for dc planning. Pt has had additional SNF bed offers since last week. She has now chosen [facility]. Spoke with [facility] liaison who states there is a bed available for her tomorrow. Auth started with [facility] [id number]. TOC will continue to follow.    Barriers to Discharge: Continued Medical Work up     Discharge Planning Services: CM Consult   Living arrangements for the past 2 months: Single Family Home                    Readmission Risk Interventions No flowsheet data found.

## 2021-07-12 DIAGNOSIS — I5189 Other ill-defined heart diseases: Secondary | ICD-10-CM

## 2021-07-12 LAB — CBC WITH DIFFERENTIAL/PLATELET
Abs Immature Granulocytes: 0.06 10*3/uL (ref 0.00–0.07)
Basophils Absolute: 0 10*3/uL (ref 0.0–0.1)
Basophils Relative: 0 %
Eosinophils Absolute: 0 10*3/uL (ref 0.0–0.5)
Eosinophils Relative: 0 %
HCT: 40.6 % (ref 36.0–46.0)
Hemoglobin: 13.5 g/dL (ref 12.0–15.0)
Immature Granulocytes: 0 %
Lymphocytes Relative: 13 %
Lymphs Abs: 1.8 10*3/uL (ref 0.7–4.0)
MCH: 30.1 pg (ref 26.0–34.0)
MCHC: 33.3 g/dL (ref 30.0–36.0)
MCV: 90.6 fL (ref 80.0–100.0)
Monocytes Absolute: 1.1 10*3/uL — ABNORMAL HIGH (ref 0.1–1.0)
Monocytes Relative: 8 %
Neutro Abs: 10.8 10*3/uL — ABNORMAL HIGH (ref 1.7–7.7)
Neutrophils Relative %: 79 %
Platelets: 258 10*3/uL (ref 150–400)
RBC: 4.48 MIL/uL (ref 3.87–5.11)
RDW: 13.1 % (ref 11.5–15.5)
WBC: 13.8 10*3/uL — ABNORMAL HIGH (ref 4.0–10.5)
nRBC: 0 % (ref 0.0–0.2)

## 2021-07-12 LAB — COMPREHENSIVE METABOLIC PANEL
ALT: 17 U/L (ref 0–44)
AST: 31 U/L (ref 15–41)
Albumin: 3.5 g/dL (ref 3.5–5.0)
Alkaline Phosphatase: 39 U/L (ref 38–126)
Anion gap: 8 (ref 5–15)
BUN: 23 mg/dL (ref 8–23)
CO2: 23 mmol/L (ref 22–32)
Calcium: 8.9 mg/dL (ref 8.9–10.3)
Chloride: 101 mmol/L (ref 98–111)
Creatinine, Ser: 0.91 mg/dL (ref 0.44–1.00)
GFR, Estimated: 60 mL/min — ABNORMAL LOW (ref >60)
Glucose, Bld: 111 mg/dL — ABNORMAL HIGH (ref 70–99)
Potassium: 4.3 mmol/L (ref 3.5–5.1)
Sodium: 132 mmol/L — ABNORMAL LOW (ref 135–145)
Total Bilirubin: 1.4 mg/dL — ABNORMAL HIGH (ref 0.3–1.2)
Total Protein: 6.2 g/dL — ABNORMAL LOW (ref 6.5–8.1)

## 2021-07-12 LAB — RESP PANEL BY RT-PCR (FLU A&B, COVID) ARPGX2
Influenza A by PCR: NEGATIVE
Influenza B by PCR: NEGATIVE
SARS Coronavirus 2 by RT PCR: NEGATIVE

## 2021-07-12 LAB — CSF CULTURE W GRAM STAIN: Culture: NO GROWTH

## 2021-07-12 LAB — PHOSPHORUS: Phosphorus: 4.1 mg/dL (ref 2.5–4.6)

## 2021-07-12 LAB — MAGNESIUM: Magnesium: 2.2 mg/dL (ref 1.7–2.4)

## 2021-07-12 NOTE — Care Management Important Message (Signed)
Important Message  Patient Details IM Letter placed in Patients room. Name: [name] MRN: [id number] Date of Birth: 28-Nov-1928   Medicare Important Message Given:  Yes     Kerin Salen 07/12/2021, 11:34 AM

## 2021-07-12 NOTE — Progress Notes (Signed)
Subjective: Left leg improving but not back to her baseline. No other complaints.   Exam: Vitals:   07/11/21 1942 07/12/21 0443  BP: (!) 146/77 (!) 150/71  Pulse: 97 82  Resp: 14 16  Temp: 98.2 F (36.8 C) 97.7 F (36.5 C)  SpO2: 97% 98%   Gen: In bed, NAD Resp: non-labored breathing, no acute distress Abd: soft, nt  Neuro: MS: Awake, alert, interactive and appropriate CN: EOMI, face symmetric Motor: 5/5 in bilateral arms, she has 4/5 weakness in left leg DTR: She has triple flexion to plantar stimulation on the left  Impression: 88 year old female with new onset left leg weakness with a thoracic spine T2 lesion.    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  Completed course of IV steroids with some improvement.   Recommendations: 1) will need outpt neurology f/u and repeat MRI of thoracic spine in 2-3 weeks.  This will help determine etiolgy if lesion is still present after steroids.   Avoid vaccines for now.  2) Ok to go to rehab. Neurology will sign off.   Pt's questions were answered to her satisfaction.    Total of 35 mins spent reviewing chart, discussion with patient on prognosis, Dx and plan. Discussed case with patient's nurse. Reviewed Imaging personally.   If 7pm- 7am, please page neurology on call as listed in [place].

## 2021-07-12 NOTE — Discharge Summary (Signed)
Physician Discharge Summary  [name] ZLD:[id number] DOB: 1928/08/22 DOA: 07/05/2021  PCP: Crist Infante, MD  Admit date: 07/05/2021 Discharge date: 07/12/2021  Time spent: 40 minutes  Recommendations for Outpatient Follow-up:  Follow outpatient CBC/CMP Follow with neurology outpatient  Follow oligoclonal bands and igg index outpatient Continue aspirin, holding statin for now with hx intolerance, can consider outpatient trial  Follow abnormal echo findings with cards outpatient    Discharge Diagnoses:  Principal Problem:   Left leg weakness Active Problems:   Spasm   HTN (hypertension)   Type II diabetes mellitus with renal manifestations ([place])   Hypercholesterolemia   Anxiety   COVID-19 vaccine administered   Diastolic dysfunction   History of breast cancer   GERD (gastroesophageal reflux disease)   Lower extremity weakness   Discharge Condition: stable  Diet recommendation: heart healthy  There were no vitals filed for this visit.  History of present illness:  88 yo with hx breast cancer, HLD, HTN, diet controlled diabetes who presented on 2/7 with generalized weakness and progressive difficulty walking.  She noted progressive LLE weakness and presented to the ED.  She's been admitted for additional workup of her generalized and left lower extremity weakness.  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  Neurology is following, she's now s/p 5 days of solumedrol at 500 mg daily.  Will discharge to SNF with neurology follow up.  Continue her aspirin.    See below for additional details  Hospital Course:  Assessment and Plan: * Left leg weakness- (present on admission) New LLE weakness Previously independent Negative MRI brain MRI lumbar spine with moderate levoscoliosis and multilevel lumbar spondylosis as described above, mild to moderate left lateral recess stenosis at L4-L5 could affect the  descending L L5 nerve root C spine MRI with moderate L neuroforaminal stenosis at C6-7, erosive change of dens suggestive of CPPD T spine MRI with short segment patchy non expansile non compressive increased T2 signal in anterior cord at T6 without enhancement Repeat MRI with restricted diffusion at site of ventral cord T2 signal abnormality running length of T6 vertebral body - spinal cord infract vs demyelinating disease? 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  Neurology recommending follow up outpatient, may recommend imaging outpatient to clarify infarct vs related to inflammatory reaction from vaccine.  Recommend continuing aspirin.  Will hold off on statin with hx intolerance and age.   A1c 6.4 LDL 139, continue zetia    Spasm Describes jerks and spasms of LLE Follow MRI as above, consider additional w/u CK wnl Appreciate neurology  Consider symptomatic treatment as needed outpatient  Type II diabetes mellitus with renal manifestations ([place])- (present on admission) Follow BG's Diet controlled Follow A1c (6.4)   HTN (hypertension)- (present on admission) Metoprolol, amlodipine  Anxiety- (present on admission) xanax  Hypercholesterolemia- (present on admission) zetia  COVID-19 vaccine administered S/ p booster on 2/4  Avoid further covid vaccines with concern this is related to her LLE weakness  Diastolic  dysfunction Follow diastolic dysfunction, severe asymmetric  LV hypertrophy, and mildly elevated PASP with cardiology outpatient   Procedures: Echo IMPRESSIONS     1. Left ventricular ejection fraction, by estimation, is 70 to 75%. The  left ventricle has hyperdynamic function. The left ventricle has no  regional wall motion abnormalities. There is severe asymmetric left  ventricular hypertrophy of the basal-septal  segment. Left ventricular diastolic parameters are consistent with Grade  II diastolic dysfunction (pseudonormalization). Elevated left atrial  pressure.   2. Right ventricular systolic function is normal. The right ventricular  size is normal. There is mildly elevated pulmonary artery systolic  pressure. The estimated right ventricular systolic pressure is 35.3 mmHg.   3. The mitral valve is degenerative. Trivial mitral valve regurgitation.  Moderate mitral stenosis. MG 89mmg at 85bpm, MVA 1.1 cm^2 by continuity  equation. Severe mitral annular calcification.   4. The aortic valve is tricuspid. Aortic valve regurgitation is not  visualized. Aortic valve sclerosis is present, with no evidence of aortic  valve stenosis.   5. The inferior vena cava is normal in size with greater than 50%  respiratory variability, suggesting right atrial pressure of 3 mmHg.    Consultations: neurology  Discharge Exam: Vitals:   07/11/21 1942 07/12/21 0443  BP: (!) 146/77 (!) 150/71  Pulse: 97 82  Resp: 14 16  Temp: 98.2 F (36.8 C) 97.7 F (36.5 C)  SpO2: 97% 98%   No new complaints  General: No acute distress. Cardiovascular: RRR Lungs: unlabored Abdomen: Soft, nontender, nondistended  Neurological: continued 4/5 strength to LLE, upgoing toes on L Skin: Warm and dry. No rashes or lesions. Extremities: No clubbing or cyanosis. No edema.  Discharge Instructions   Discharge Instructions     Ambulatory referral to Neurology   Complete by: As directed    An appointment is requested in approximately: 2 weeks   Call MD for:   difficulty breathing, headache or visual disturbances   Complete by: As directed    Call MD for:  extreme fatigue   Complete by: As directed    Call MD for:  hives   Complete by: As directed    Call MD for:  persistant dizziness or light-headedness   Complete by: As directed    Call MD for:  persistant nausea and vomiting   Complete by: As directed    Call MD for:  redness, tenderness, or signs of infection (pain, swelling, redness, odor or green/yellow discharge around incision site)   Complete by: As directed    Call MD for:  severe uncontrolled pain   Complete by: As directed    Call MD for:  temperature >100.4   Complete by: As directed    Diet - low sodium heart healthy   Complete by: As directed    Discharge instructions   Complete by: As directed    You were seen for left lower extremity weakness. ' This was either related to inflammation from the covid vaccine or Casi Westerfeld spinal stroke.  We'll refer you to neurology to help continue to work up and clarify this further.  Continue your aspirin.  I'll hold off on [place] statin at this time given your previous history of intolerance.   Avoid further covid vaccines.  Your echo showed hypertrophy and diastolic dysfunction as well as mildly elevated pulmonary artery systolic pressure.  You can follow these findings up outpatient cardiology.  You have pending labs from the spinal fluid.  Follow this with your PCP and neurology  outpatient.  Return for new, recurrent, or worsening symptoms.  Please ask your PCP to request records from this hospitalization so they know what was done and what the next steps will be.   Increase activity slowly   Complete by: As directed       Allergies as of 07/12/2021   No Known Allergies      Medication List     TAKE these medications    ALPRAZolam 0.25 MG tablet Commonly known as: XANAX Take 0.25 mg by mouth at bedtime.   amLODipine 5 MG tablet Commonly known as: NORVASC Take 0.5 tablets (2.5  mg total) by mouth daily.   aspirin 325 MG EC tablet Take 325 mg by mouth daily.   calcium carbonate 500 MG chewable tablet Commonly known as: TUMS - dosed in mg elemental calcium Chew 1 tablet by mouth at bedtime.   Co Q-10 200 MG Caps Take 1 capsule by mouth daily.   diphenoxylate-atropine 2.5-0.025 MG tablet Commonly known as: LOMOTIL Take 1 tablet by mouth 4 (four) times daily as needed for diarrhea or loose stools.   ezetimibe 10 MG tablet Commonly known as: ZETIA Take 10 mg by mouth daily.   JOINT HEALTH PO Take 1 tablet by mouth daily.   metoprolol succinate 25 MG 24 hr tablet Commonly known as: TOPROL-XL TAKE 1/2 TABLET BY MOUTH DAILY   multivitamin tablet Take 1 tablet by mouth daily.   omeprazole 20 MG capsule Commonly known as: PRILOSEC Take 1 capsule (20 mg total) by mouth daily.   PreviDent 5000 Booster Plus 1.1 % Pste Generic drug: Sodium Fluoride Place onto teeth 2 (two) times daily.   raloxifene 60 MG tablet Commonly known as: EVISTA TAKE 1 TABLET BY MOUTH ONCE DAILY   saccharomyces boulardii 250 MG capsule Commonly known as: FLORASTOR Take 1 capsule (250 mg total) by mouth 2 (two) times daily.   vitamin B-12 500 MCG tablet Commonly known as: CYANOCOBALAMIN Take 500 mcg by mouth daily.   vitamin C 500 MG tablet Commonly known as: ASCORBIC ACID Take 500 mg by mouth daily.   Vitamin D 1000 units capsule Take 1,000 Units by mouth daily.       No Known Allergies    The results of significant diagnostics from this hospitalization (including imaging, microbiology, ancillary and laboratory) are listed below for reference.    Significant Diagnostic Studies: DG Sacrum/Coccyx  Result Date: 07/05/2021 CLINICAL DATA:  Weakness EXAM: SACRUM AND COCCYX - 2+ VIEW COMPARISON:  None. FINDINGS: Degenerative changes in the visualized lower lumbar spine and hips bilaterally. No visible sacral or coccygeal fracture. SI joints symmetric. IMPRESSION: No  acute bony abnormality. Electronically Signed   By: Rolm Baptise M.D.   On: 07/05/2021 19:06   CT Head Wo Contrast  Result Date: 07/05/2021 CLINICAL DATA:  Mental status change EXAM: CT HEAD WITHOUT CONTRAST TECHNIQUE: Contiguous axial images were obtained from the base of the skull through the vertex without intravenous contrast. RADIATION DOSE REDUCTION: This exam was performed according to the departmental dose-optimization program which includes automated exposure control, adjustment of the mA and/or kV according to patient size and/or use of iterative reconstruction technique. COMPARISON:  None. FINDINGS: Brain: No evidence of acute infarction, hemorrhage, hydrocephalus, extra-axial collection or mass lesion/mass effect. Vascular: No hyperdense vessel or unexpected calcification. Skull: Normal. Negative for fracture or focal lesion. Sinuses/Orbits: No acute finding. Other: None. IMPRESSION: No acute intracranial abnormality. Electronically Signed   By: Yetta Glassman M.D.   On: 07/05/2021 12:45  MR BRAIN WO CONTRAST  Result Date: 07/05/2021 CLINICAL DATA:  TIA.  Acute neuro deficit. EXAM: MRI HEAD WITHOUT CONTRAST TECHNIQUE: Multiplanar, multiecho pulse sequences of the brain and surrounding structures were obtained without intravenous contrast. COMPARISON:  CT head 07/05/2021 FINDINGS: Brain: Generalized atrophy, typical for age. Negative for hydrocephalus. Moderate white matter hyperintensities bilaterally. Mild hyperintensity in the pons. Negative for acute infarct, hemorrhage, mass. Vascular: Negative for hyperdense vessel Skull and upper cervical spine: No acute skeletal abnormality. C1-2 degenerative change with prominent pannus. No significant stenosis. Sinuses/Orbits: Paranasal sinuses clear. Bilateral cataract extraction Other: None IMPRESSION: No acute abnormality Atrophy and moderate chronic microvascular ischemia Electronically Signed   By: Franchot Gallo M.D.   On: 07/05/2021 19:12   MR  THORACIC SPINE WO CONTRAST  Result Date: 07/09/2021 CLINICAL DATA:  Myelopathy, acute, thoracic spine. Progressive left leg weakness. EXAM: MRI THORACIC SPINE WITHOUT CONTRAST TECHNIQUE: Multiplanar, multisequence MR imaging of the thoracic spine was performed. No intravenous contrast was administered. COMPARISON:  07/07/2021 FINDINGS: The study was performed specifically to achieve diffusion imaging. The study confirms restricted diffusion in the anterior cord at the T6 level running the length of the T6 vertebral body. Restricted diffusion could indicate either spinal cord infarction or acute demyelinating disease. In Starlynn Klinkner patient of this age, spinal cord infarction is cyst distantly much more likely. IMPRESSION: Restricted diffusion at the site of the ventral cord T2 signal abnormality running the length of the T6 vertebral body. This appearance could be seen with spinal cord infarction or demyelinating disease. Spinal cord infarction would be more common in Waver Dibiasio person of this age. Electronically Signed   By: Nelson Chimes M.D.   On: 07/09/2021 20:49   MR LUMBAR SPINE WO CONTRAST  Result Date: 07/06/2021 CLINICAL DATA:  Progressive left leg weakness. Remote history of breast cancer. EXAM: MRI LUMBAR SPINE WITHOUT CONTRAST TECHNIQUE: Multiplanar, multisequence MR imaging of the lumbar spine was performed. No intravenous contrast was administered. COMPARISON:  Lumbar spine x-rays from yesterday. CT abdomen pelvis dated December 02, 2018. FINDINGS: Segmentation:  Standard. Alignment:  Levoscoliosis.  Trace anterolisthesis at L5-S1. Vertebrae:  No fracture, evidence of discitis, or bone lesion. Conus medullaris and cauda equina: Conus extends to the L2 level. Conus and cauda equina appear normal. Paraspinal and other soft tissues: Negative. Disc levels: T11-T12: Tiny central disc protrusion.  No stenosis. T12-L1:  Negative. L1-L2:  Circumferential disc osteophyte complex.  No stenosis. L2-L3: Circumferential disc  osteophyte complex. Mild right lateral recess stenosis. Mild bilateral neuroforaminal stenosis. No spinal canal stenosis. L3-L4: Mild disc bulging with right-sided disc osteophyte complex. No stenosis. L4-L5: Circumferential disc bulging and endplate spurring. Superimposed small left subarticular disc protrusion. Moderate bilateral facet arthropathy. Mild to moderate left lateral recess stenosis. Mild bilateral neuroforaminal stenosis. No spinal canal stenosis. L5-S1: Left eccentric disc bulging. Moderate to severe bilateral facet arthropathy. Mild left greater than right lateral recess stenosis. Mild left neuroforaminal stenosis. No spinal canal or right neuroforaminal stenosis. IMPRESSION: 1. Moderate levoscoliosis and multilevel lumbar spondylosis as described above. Mild to moderate left lateral recess stenosis at L4-L5 could affect the descending left L5 nerve root. No high-grade spinal canal or neuroforaminal stenosis. Electronically Signed   By: Titus Dubin M.D.   On: 07/06/2021 15:24   MR CERVICAL SPINE W WO CONTRAST  Result Date: 07/07/2021 CLINICAL DATA:  Progressive left leg weakness. Remote history of breast cancer. EXAM: MRI CERVICAL AND THORACIC SPINE WITHOUT AND WITH CONTRAST TECHNIQUE: Multiplanar and multiecho pulse sequences of the cervical  spine, to include the craniocervical junction and cervicothoracic junction, and the thoracic spine, were obtained without and with intravenous contrast. CONTRAST:  55mL GADAVIST GADOBUTROL 1 MMOL/ML IV SOLN COMPARISON:  None. FINDINGS: MRI CERVICAL SPINE FINDINGS Alignment: Trace anterolisthesis at C3-C4, C4-C5, and C7-T1. 3 mm anterolisthesis at C5-C6. Vertebrae: No fracture, evidence of discitis, or bone lesion. Erosive changes of the dens with prominent surrounding T2 hypointense, T1 isointense nonenhancing signal. Cord: Normal signal and morphology.  No intrathecal enhancement. Posterior Fossa, vertebral arteries, paraspinal tissues: Chronic  microvascular ischemic changes in the pons. Disc levels: C2-C3: Negative disc. Moderate right and mild left facet arthropathy. Mild right neuroforaminal stenosis. No spinal canal or left neuroforaminal stenosis. C3-C4: Tiny central disc protrusion. Moderate bilateral facet arthropathy. Mild left neuroforaminal stenosis. No spinal canal or right neuroforaminal stenosis. C4-C5: Negative disc. Mild left uncovertebral hypertrophy. Moderate right facet arthropathy. Ankylosis of the left facet joint. No stenosis. C5-C6: Circumferential disc bulging with mild-to-moderate bilateral facet uncovertebral hypertrophy. No stenosis. C6-C7: Small posterior disc osteophyte complex. Moderate left and mild right uncovertebral hypertrophy. Ankylosis of the right facet joint. Moderate left neuroforaminal stenosis. No spinal canal or right neuroforaminal stenosis. C7-T1: Small circumferential disc osteophyte complex and moderate bilateral facet arthropathy. Mild left neuroforaminal stenosis. No spinal canal or right neuroforaminal stenosis. MRI THORACIC SPINE FINDINGS Alignment:  Physiologic. Vertebrae: No fracture, evidence of discitis, or bone lesion. Cord: Patchy non-expansile increased T2 signal in the anterior cord at T6 (series 19, images 11 and 12). No associated enhancement. Remaining cord signal is normal. Paraspinal and other soft tissues: Large hiatal hernia. Otherwise negative. Disc levels: Mild disc bulging from T1-T2 through T4-T5 and at T6-T7. Small central disc protrusion at T11-T12. Scattered mild thoracic facet arthropathy. No spinal canal or neuroforaminal stenosis at any level. IMPRESSION: Cervical spine: 1. Multilevel cervical spondylosis as described above. Moderate left neuroforaminal stenosis at C6-C7. 2. Normal cervical cord. 3. Erosive changes of the dens with surrounding low signal suggestive of CPPD arthropathy. Thoracic spine: 1. Short-segment patchy non-expansile non-compressive increased T2 signal in the  anterior cord at T6 without enhancement. This is nonspecific and may reflect metabolic disease, chronic demyelinating disease, or chronic inflammatory/immune mediated disease. Electronically Signed   By: Titus Dubin M.D.   On: 07/07/2021 18:32   MR THORACIC SPINE W WO CONTRAST  Result Date: 07/07/2021 CLINICAL DATA:  Progressive left leg weakness. Remote history of breast cancer. EXAM: MRI CERVICAL AND THORACIC SPINE WITHOUT AND WITH CONTRAST TECHNIQUE: Multiplanar and multiecho pulse sequences of the cervical spine, to include the craniocervical junction and cervicothoracic junction, and the thoracic spine, were obtained without and with intravenous contrast. CONTRAST:  18mL GADAVIST GADOBUTROL 1 MMOL/ML IV SOLN COMPARISON:  None. FINDINGS: MRI CERVICAL SPINE FINDINGS Alignment: Trace anterolisthesis at C3-C4, C4-C5, and C7-T1. 3 mm anterolisthesis at C5-C6. Vertebrae: No fracture, evidence of discitis, or bone lesion. Erosive changes of the dens with prominent surrounding T2 hypointense, T1 isointense nonenhancing signal. Cord: Normal signal and morphology.  No intrathecal enhancement. Posterior Fossa, vertebral arteries, paraspinal tissues: Chronic microvascular ischemic changes in the pons. Disc levels: C2-C3: Negative disc. Moderate right and mild left facet arthropathy. Mild right neuroforaminal stenosis. No spinal canal or left neuroforaminal stenosis. C3-C4: Tiny central disc protrusion. Moderate bilateral facet arthropathy. Mild left neuroforaminal stenosis. No spinal canal or right neuroforaminal stenosis. C4-C5: Negative disc. Mild left uncovertebral hypertrophy. Moderate right facet arthropathy. Ankylosis of the left facet joint. No stenosis. C5-C6: Circumferential disc bulging with mild-to-moderate bilateral facet uncovertebral hypertrophy. No  stenosis. C6-C7: Small posterior disc osteophyte complex. Moderate left and mild right uncovertebral hypertrophy. Ankylosis of the right facet joint.  Moderate left neuroforaminal stenosis. No spinal canal or right neuroforaminal stenosis. C7-T1: Small circumferential disc osteophyte complex and moderate bilateral facet arthropathy. Mild left neuroforaminal stenosis. No spinal canal or right neuroforaminal stenosis. MRI THORACIC SPINE FINDINGS Alignment:  Physiologic. Vertebrae: No fracture, evidence of discitis, or bone lesion. Cord: Patchy non-expansile increased T2 signal in the anterior cord at T6 (series 19, images 11 and 12). No associated enhancement. Remaining cord signal is normal. Paraspinal and other soft tissues: Large hiatal hernia. Otherwise negative. Disc levels: Mild disc bulging from T1-T2 through T4-T5 and at T6-T7. Small central disc protrusion at T11-T12. Scattered mild thoracic facet arthropathy. No spinal canal or neuroforaminal stenosis at any level. IMPRESSION: Cervical spine: 1. Multilevel cervical spondylosis as described above. Moderate left neuroforaminal stenosis at C6-C7. 2. Normal cervical cord. 3. Erosive changes of the dens with surrounding low signal suggestive of CPPD arthropathy. Thoracic spine: 1. Short-segment patchy non-expansile non-compressive increased T2 signal in the anterior cord at T6 without enhancement. This is nonspecific and may reflect metabolic disease, chronic demyelinating disease, or chronic inflammatory/immune mediated disease. Electronically Signed   By: Titus Dubin M.D.   On: 07/07/2021 18:32   DG Lumbar Spine 1 View  Result Date: 07/05/2021 CLINICAL DATA:  Weakness EXAM: LUMBAR SPINE - 1 VIEW COMPARISON:  None. FINDINGS: Leftward scoliosis centered in the mid lumbar spine. Evaluation is limited without lateral view. No visible fracture or focal bone lesion. Degenerative changes throughout the lumbar spine and in the hips bilaterally. SI joints symmetric. IMPRESSION: Limited evaluation with only single AP view submitted. No visible acute bony abnormality. Advanced degenerative changes. Electronically  Signed   By: Rolm Baptise M.D.   On: 07/05/2021 19:06   DG Chest Port 1 View  Result Date: 07/05/2021 CLINICAL DATA:  Weakness EXAM: PORTABLE CHEST 1 VIEW COMPARISON:  Chest radiograph dated December 02, 2018 FINDINGS: The heart size and mediastinal contours are within normal limits. Atherosclerotic calcification of the aortic arch. Moderate hiatal hernia. Both lungs are clear. Left axillary surgical staples, unchanged. No acute osseous abnormality. IMPRESSION: No acute cardiopulmonary process. Electronically Signed   By: Keane Police D.O.   On: 07/05/2021 13:17   ECHOCARDIOGRAM COMPLETE  Result Date: 07/11/2021    ECHOCARDIOGRAM REPORT   Patient Name:   Zacaria Alacia Rehmann Younker Date of Exam: 07/11/2021 Medical Rec #:  [id number]    Height:       58.0 in Accession #:    [id number]   Weight:       90.0 lb Date of Birth:  [birth year]    BSA:          1.297 m Patient Age:    88 years     BP:           130/73 mmHg Patient Gender: F            HR:           92 bpm. Exam Location:  Inpatient Procedure: 2D Echo, Cardiac Doppler and Color Doppler Indications:    Lower extremity weakness  History:        Patient has prior history of Echocardiogram examinations, most                 recent 04/13/2015. Risk Factors:Dyslipidemia and Hypertension.  Sonographer:    Luisa Hart RDCS Referring Phys: [phone] Tersea Aulds CALDWELL POWELL [place]  1. Left  ventricular ejection fraction, by estimation, is 70 to 75%. The left ventricle has hyperdynamic function. The left ventricle has no regional wall motion abnormalities. There is severe asymmetric left ventricular hypertrophy of the basal-septal segment. Left ventricular diastolic parameters are consistent with Grade II diastolic dysfunction (pseudonormalization). Elevated left atrial pressure.  2. Right ventricular systolic function is normal. The right ventricular size is normal. There is mildly elevated pulmonary artery systolic pressure. The estimated right ventricular systolic pressure is 86.7  mmHg.  3. The mitral valve is degenerative. Trivial mitral valve regurgitation. Moderate mitral stenosis. MG 62mmg at 85bpm, MVA 1.1 cm^2 by continuity equation. Severe mitral annular calcification.  4. The aortic valve is tricuspid. Aortic valve regurgitation is not visualized. Aortic valve sclerosis is present, with no evidence of aortic valve stenosis.  5. The inferior vena cava is normal in size with greater than 50% respiratory variability, suggesting right atrial pressure of 3 mmHg. FINDINGS  Left Ventricle: Left ventricular ejection fraction, by estimation, is 70 to 75%. The left ventricle has hyperdynamic function. The left ventricle has no regional wall motion abnormalities. The left ventricular internal cavity size was small. There is severe asymmetric left ventricular hypertrophy of the basal-septal segment. Left ventricular diastolic parameters are consistent with Grade II diastolic dysfunction (pseudonormalization). Elevated left atrial pressure. Right Ventricle: The right ventricular size is normal. No increase in right ventricular wall thickness. Right ventricular systolic function is normal. There is mildly elevated pulmonary artery systolic pressure. The tricuspid regurgitant velocity is 3.07  m/s, and with an assumed right atrial pressure of 3 mmHg, the estimated right ventricular systolic pressure is 67.2 mmHg. Left Atrium: Left atrial size was normal in size. Right Atrium: Right atrial size was normal in size. Pericardium: There is no evidence of pericardial effusion. Mitral Valve: The mitral valve is degenerative in appearance. Severe mitral annular calcification. Trivial mitral valve regurgitation. Moderate mitral valve stenosis. MV peak gradient, 13.0 mmHg. The mean mitral valve gradient is 4.5 mmHg. Tricuspid Valve: The tricuspid valve is normal in structure. Tricuspid valve regurgitation is trivial. Aortic Valve: The aortic valve is tricuspid. Aortic valve regurgitation is not visualized.  Aortic valve sclerosis is present, with no evidence of aortic valve stenosis. Aortic valve mean gradient measures 4.0 mmHg. Aortic valve peak gradient measures 6.5  mmHg. Aortic valve area, by VTI measures 1.52 cm. Pulmonic Valve: The pulmonic valve was grossly normal. Pulmonic valve regurgitation is trivial. Aorta: The aortic root and ascending aorta are structurally normal, with no evidence of dilitation. Venous: The inferior vena cava is normal in size with greater than 50% respiratory variability, suggesting right atrial pressure of 3 mmHg. IAS/Shunts: No atrial level shunt detected by color flow Doppler.  LEFT VENTRICLE PLAX 2D LVIDd:         3.00 cm     Diastology LVIDs:         1.20 cm     LV e' medial:    4.24 cm/s LV PW:         1.00 cm     LV E/e' medial:  25.7 LV IVS:        1.60 cm     LV e' lateral:   4.35 cm/s LVOT diam:     1.50 cm     LV E/e' lateral: 25.1 LV SV:         39 LV SV Index:   30 LVOT Area:     1.77 cm  LV Volumes (MOD) LV vol d, MOD A2C: 26.0  ml LV vol d, MOD A4C: 26.1 ml LV vol s, MOD A2C: 5.2 ml LV vol s, MOD A4C: 5.0 ml LV SV MOD A2C:     20.8 ml LV SV MOD A4C:     26.1 ml LV SV MOD BP:      21.7 ml RIGHT VENTRICLE RV Basal diam:  1.90 cm RV Mid diam:    1.70 cm TAPSE (M-mode): 1.8 cm LEFT ATRIUM             Index        RIGHT ATRIUM          Index LA diam:        2.20 cm 1.70 cm/m   RA Area:     9.98 cm LA Vol (A2C):   21.0 ml 16.19 ml/m  RA Volume:   16.10 ml 12.41 ml/m LA Vol (A4C):   27.2 ml 20.97 ml/m LA Biplane Vol: 24.2 ml 18.66 ml/m  AORTIC VALVE                    PULMONIC VALVE AV Area (Vmax):    1.57 cm     PV Vmax:       1.25 m/s AV Area (Vmean):   1.39 cm     PV Vmean:      86.300 cm/s AV Area (VTI):     1.52 cm     PV VTI:        0.285 m AV Vmax:           127.00 cm/s  PV Peak grad:  6.2 mmHg AV Vmean:          89.700 cm/s  PV Mean grad:  3.0 mmHg AV VTI:            0.254 m AV Peak Grad:      6.5 mmHg AV Mean Grad:      4.0 mmHg LVOT Vmax:         113.00 cm/s  LVOT Vmean:        70.800 cm/s LVOT VTI:          0.218 m LVOT/AV VTI ratio: 0.86  AORTA Ao Asc diam: 2.70 cm MITRAL VALVE                TRICUSPID VALVE MV Area (PHT): 3.89 cm     TR Peak grad:   37.7 mmHg MV Area VTI:   1.22 cm     TR Vmax:        307.00 cm/s MV Peak grad:  13.0 mmHg MV Mean grad:  4.5 mmHg     SHUNTS MV Vmax:       1.81 m/s     Systemic VTI:  0.22 m MV Vmean:      94.0 cm/s    Systemic Diam: 1.50 cm MV Decel Time: 195 msec MV E velocity: 109.00 cm/s MV Seraj Dunnam velocity: 177.00 cm/s MV E/Tae Robak ratio:  0.62 Oswaldo Milian MD Electronically signed by Oswaldo Milian MD Signature Date/Time: 07/11/2021/6:38:16 PM    Final    DG FLUORO GUIDE LUMBAR PUNCTURE  Result Date: 07/08/2021 CLINICAL DATA:  Lower extremity weakness EXAM: DIAGNOSTIC LUMBAR PUNCTURE UNDER FLUOROSCOPIC GUIDANCE COMPARISON:  Lumbar spine MRI 07/06/2021 FLUOROSCOPY: Fluoroscopy Time:  0 minutes, 48 seconds Radiation Exposure Index (if provided by the fluoroscopic device): 4 mGy air kerma Number of Acquired Spot Images: 1 PROCEDURE: I discussed the risks (including hemorrhage, infection, headache, and nerve damage, among others), benefits, and alternatives to  fluoroscopically guided lumbar puncture with the patient. We specifically discussed the moderately high technical likelihood of success of the procedure (in the context of the patient's spondylosis and scoliosis). The patient understood and elected to undergo the procedure. Standard time-out was employed. Following sterile skin prep and local anesthetic administration consisting of 1 percent lidocaine, Maddilyn Campus 22 gauge spinal needle was advanced without difficulty into the thecal sac at the at the L5-S1 level under fluoroscopic guidance. Red tinged CSF was returned. Opening pressure was not assessed. 12 cc of CSF was collected. The needle was subsequently removed and the skin cleansed and bandaged. No immediate complications were observed. IMPRESSION: 1. Technically successful  fluoroscopically guided lumbar puncture at the L5-S1 level yielding 12 cc of at least initially red tinged CSF. Electronically Signed   By: Van Clines M.D.   On: 07/08/2021 14:51    Microbiology: Recent Results (from the past 240 hour(s))  Resp Panel by RT-PCR (Flu Gaylon Bentz&B, Covid) Nasopharyngeal Swab     Status: None   Collection Time: 07/05/21 11:34 AM   Specimen: Nasopharyngeal Swab; Nasopharyngeal(NP) swabs in vial transport medium  Result Value Ref Range Status   SARS Coronavirus 2 by RT PCR NEGATIVE NEGATIVE Final    Comment: (NOTE) SARS-CoV-2 target nucleic acids are NOT DETECTED.  The SARS-CoV-2 RNA is generally detectable in upper respiratory specimens during the acute phase of infection. The lowest concentration of SARS-CoV-2 viral copies this assay can detect is 138 copies/mL. Prescott Truex negative result does not preclude SARS-Cov-2 infection and should not be used as the sole basis for treatment or other patient management decisions. Sherisse Fullilove negative result may occur with  improper specimen collection/handling, submission of specimen other than nasopharyngeal swab, presence of viral mutation(s) within the areas targeted by this assay, and inadequate number of viral copies(<138 copies/mL). Amilia Vandenbrink negative result must be combined with clinical observations, patient history, and epidemiological information. The expected result is Negative.  Fact Sheet for Patients:  EntrepreneurPulse.com.au  Fact Sheet for Healthcare Providers:  IncredibleEmployment.be  This test is no t yet approved or cleared by the [place] FDA and  has been authorized for detection and/or diagnosis of SARS-CoV-2 by FDA under an Emergency Use Authorization (EUA). This EUA will remain  in effect (meaning this test can be used) for the duration of the COVID-19 declaration under Section 564(b)(1) of the Act, 21 U.S.C.section 360bbb-3(b)(1), unless the authorization is terminated  or  revoked sooner.       Influenza Aysen Shieh by PCR NEGATIVE NEGATIVE Final   Influenza B by PCR NEGATIVE NEGATIVE Final    Comment: (NOTE) The Xpert Xpress SARS-CoV-2/FLU/RSV plus assay is intended as an aid in the diagnosis of influenza from Nasopharyngeal swab specimens and should not be used as [place] sole basis for treatment. Nasal washings and aspirates are unacceptable for Xpert Xpress SARS-CoV-2/FLU/RSV testing.  Fact Sheet for Patients: EntrepreneurPulse.com.au  Fact Sheet for Healthcare Providers: IncredibleEmployment.be  This test is not yet approved or cleared by the [place] FDA and has been authorized for detection and/or diagnosis of SARS-CoV-2 by FDA under an Emergency Use Authorization (EUA). This EUA will remain in effect (meaning this test can be used) for the duration of the COVID-19 declaration under Section 564(b)(1) of the Act, 21 U.S.C. section 360bbb-3(b)(1), unless the authorization is terminated or revoked.  Performed at [facility], [place] [employer] [address] [place],  [address]   CSF culture w Gram Stain     Status: None   Collection Time: 07/08/21  2:10 PM   Specimen: PATH Cytology CSF; Cerebrospinal Fluid  Result Value Ref Range Status   Specimen Description   Final    CSF Performed at [facility], [place] [address] [place], [place] [address]    Special Requests   Final    NONE Performed at [facility], [place] [address] [place], [place] [address]    Gram Stain   Final    WBC PRESENT,BOTH PMN AND MONONUCLEAR NO ORGANISMS SEEN CYTOSPIN SMEAR Gram Stain Report Called to,Read Back By and Verified With: GUIRAL,J ON 07/08/2021 AT 1819 BY LUZOLOP GRAM STAIN REVIEWED-AGREE WITH RESULT    Culture   Final    NO GROWTH 3 DAYS Performed at [facility], [place] [address] [place], [place] [address]    Report Status 07/12/2021 FINAL  Final     Labs: Basic Metabolic  Panel: Recent Labs  Lab 07/06/21 0350 07/07/21 0502 07/08/21 0542 07/10/21 0607 07/11/21 0455 07/12/21 0535  NA 135 136 135 135 137 132*  K 3.2* 4.3 3.7 3.6 4.6 4.3  CL 101 103 103 102 105 101  CO2 23 27 24 26 25 23   GLUCOSE 236* 106* 104* 116* 113* 111*  BUN 16 19 22  28* 28* 23  CREATININE 0.94 0.93 0.67 1.04* 0.70 0.91  CALCIUM 8.8* 8.7* 8.9 8.9 9.0 8.9  MG 2.1 2.2 2.2 2.4  --  2.2  PHOS 3.3 3.6 3.8 3.6  --  4.1   Liver Function Tests: Recent Labs  Lab 07/06/21 0350 07/07/21 0502 07/08/21 0542 07/10/21 0607 07/12/21 0535  AST 22 17 18 18 31   ALT 17 16 17 20 17   ALKPHOS 42 40 38 42 39  BILITOT 0.5 0.3 0.3 0.3 1.4*  PROT 6.1* 6.3* 6.1* 6.5 6.2*  ALBUMIN 3.5 3.4* 3.5 3.8 3.5   No results for input(s): LIPASE, AMYLASE in the last 168 hours. No results for input(s): AMMONIA in the last 168 hours. CBC: Recent Labs  Lab 07/07/21 0502 07/08/21 0542 07/10/21 0607 07/11/21 0455 07/12/21 0535  WBC 8.3 9.5 16.8* 15.3* 13.8*  NEUTROABS 5.1 6.1 13.9*  --  10.8*  HGB 12.9 13.4 13.1 13.2 13.5  HCT 38.9 41.2 39.9 39.9 40.6  MCV 91.3 91.2 90.5 89.7 90.6  PLT 218 172 247 198 258   Cardiac Enzymes: Recent Labs  Lab 07/06/21 1622  CKTOTAL 47   BNP: BNP (last 3 results) No results for input(s): BNP in the last 8760 hours.  ProBNP (last 3 results) No results for input(s): PROBNP in the last 8760 hours.  CBG: No results for input(s): GLUCAP in the last 168 hours.     Signed:  Fayrene Helper MD.  [facility] 07/12/2021, 12:36 PM

## 2021-07-12 NOTE — Progress Notes (Signed)
Occupational Therapy Treatment Patient Details Name: ORRIE LASCANO MRN: [id number] DOB: 1929/04/25 Today's Date: 07/12/2021   History of present illness 88 yo female admitted with L LE weakness. Negative MRI brain; MRI lumbar spine with moderate levoscoliosis and multilevel lumbar spondylosis, mild to moderate left lateral recess stenosis at L4-L5 could affect the descending L L5 nerve root. 2/10 lumbar puncture at L5-S1. Hx of fibromyalgia, osteoporosis, breast cancer s/p mastectomy, COVID, osteopenia.   OT comments  Patient was noted to have increased HR with attempts at bathing tasks at sink on this date. Patietns HR at start of session was 103 bpm with increase to 110s with seated tasks and 126-130 bpm with standing attempts for ADLs. Nurse made aware. Patient would continue to benefit from skilled OT services at this time while admitted and after d/c to address noted deficits in order to improve overall safety and independence in ADLs.     Recommendations for follow up therapy are one component of a multi-disciplinary discharge planning process, led by the attending physician.  Recommendations may be updated based on patient status, additional functional criteria and insurance authorization.    Follow Up Recommendations  Skilled nursing-short term rehab (<3 hours/day)    Assistance Recommended at Discharge Frequent or constant Supervision/Assistance  Patient can return home with the following  A little help with walking and/or transfers;A little help with bathing/dressing/bathroom   Equipment Recommendations  Other (comment) (defer to next venue)    Recommendations for Other Services      Precautions / Restrictions Precautions Precautions: Fall Precaution Comments: monitor HR Restrictions Weight Bearing Restrictions: No       Mobility Bed Mobility Overal bed mobility: Needs Assistance Bed Mobility: Sit to Supine     Supine to sit: Supervision, HOB elevated           Transfers                         Balance Overall balance assessment: Needs assistance Sitting-balance support: No upper extremity supported Sitting balance-Leahy Scale: Good     Standing balance support: Single extremity supported, During functional activity, Reliant on assistive device for balance Standing balance-Leahy Scale: Poor                             ADL either performed or assessed with clinical judgement   ADL Overall ADL's : Needs assistance/impaired     Grooming: Oral care;Min guard;Standing   Upper Body Bathing: Set up;Sitting Upper Body Bathing Details (indicate cue type and reason): attempted standing with HR increased to 130 bpm, Lower Body Bathing: Sit to/from stand;Min guard Lower Body Bathing Details (indicate cue type and reason): with RW with increased HR noted Upper Body Dressing : Set up;Sitting   Lower Body Dressing: Sit to/from stand;Min guard   Toilet Transfer: Rolling walker (2 wheels);Ambulation;BSC/3in1;Minimal assistance   Toileting- Clothing Manipulation and Hygiene: Minimal assistance         General ADL Comments: patient was noted to have decreased activity tolerance with onset of increased HR from 126 to 130 bpm with standing attempts for ADL tasks.    Extremity/Trunk Assessment              Vision       Perception     Praxis      Cognition Arousal/Alertness: Awake/alert Behavior During Therapy: WFL for tasks assessed/performed Overall Cognitive Status: Within Functional Limits for tasks assessed  Exercises      Shoulder Instructions       General Comments      Pertinent Vitals/ Pain       Pain Assessment Pain Assessment: No/denies pain  Home Living                                          Prior Functioning/Environment              Frequency  Min 2X/week        Progress Toward Goals  OT  Goals(current goals can now be found in the care plan section)  Progress towards OT goals: Progressing toward goals     Plan Discharge plan remains appropriate    Co-evaluation                 AM-PAC OT "6 Clicks" Daily Activity     Outcome Measure   Help from another person eating meals?: None Help from another person taking care of personal grooming?: A Little Help from another person toileting, which includes using toliet, bedpan, or urinal?: A Lot Help from another person bathing (including washing, rinsing, drying)?: A Little Help from another person to put on and taking off regular upper body clothing?: A Little Help from another person to put on and taking off regular lower body clothing?: A Little 6 Click Score: 18    End of Session Equipment Utilized During Treatment: Rolling walker (2 wheels)  OT Visit Diagnosis: Other abnormalities of gait and mobility (R26.89)   Activity Tolerance Patient tolerated treatment well   Patient Left in chair;with call bell/phone within reach   Nurse Communication Other (comment) (HR during session)        Time: 9935-7017 OT Time Calculation (min): 35 min  Charges: OT General Charges $OT Visit: 1 Visit OT Treatments $Self Care/Home Management : 23-37 mins  Jackelyn Poling OTR/L, MS Acute Rehabilitation Department Office# [phone] Pager# [phone]   Marcellina Millin 07/12/2021, 10:11 AM

## 2021-07-12 NOTE — Assessment & Plan Note (Signed)
Follow diastolic dysfunction, severe asymmetric LV hypertrophy, and mildly elevated PASP with cardiology outpatient

## 2021-07-12 NOTE — TOC Transition Note (Addendum)
Transition of Care [facility]) - CM/SW Discharge Note   Patient Details  Name: [name] MRN: [id number] Date of Birth: 1929-03-06  Transition of [facility] CM/SW Contact:  Lynnell Catalan, RN Phone Number: 07/12/2021, 3:23 PM   Clinical Narrative:    Insurance auth for SNF received from [facility] #[id number] 07/12/21-07/14/21. [employer] liaison alerted of approval.  Pt to dc to [place] today via [facility]. She will go to room 202. Yellow DNR on the shadow chart for transport. RN to call report to [phone].      Patient Goals and CMS Choice Patient states their goals for this hospitalization and ongoing recovery are:: to go back home CMS Medicare.gov Compare Post Acute Care list provided to:: Patient    Discharge Plan and Services   Discharge Planning Services: CM Consult               Readmission Risk Interventions No flowsheet data found.

## 2021-07-13 ENCOUNTER — Encounter: Payer: Self-pay | Admitting: Family Medicine

## 2021-07-13 LAB — OLIGOCLONAL BANDS, CSF + SERM

## 2021-07-15 LAB — IGG CSF INDEX
Albumin CSF-mCnc: 28 mg/dL (ref 10–46)
Albumin: 4.3 g/dL (ref 3.5–4.6)
CSF IgG Index: 0.7 (ref 0.0–0.7)
IgG (Immunoglobin G), Serum: 844 mg/dL (ref 586–1602)
IgG, CSF: 4.1 mg/dL (ref 0.0–6.7)
IgG/Alb Ratio, CSF: 0.15 (ref 0.00–0.25)

## 2021-08-25 ENCOUNTER — Encounter: Payer: Self-pay | Admitting: *Deleted

## 2021-08-26 ENCOUNTER — Ambulatory Visit (INDEPENDENT_AMBULATORY_CARE_PROVIDER_SITE_OTHER): Payer: Medicare Other | Admitting: Neurology

## 2021-08-26 DIAGNOSIS — M4714 Other spondylosis with myelopathy, thoracic region: Secondary | ICD-10-CM | POA: Diagnosis not present

## 2021-08-26 DIAGNOSIS — R252 Cramp and spasm: Secondary | ICD-10-CM

## 2021-08-26 MED ORDER — GABAPENTIN 100 MG PO CAPS: 200.0000 mg | ORAL_CAPSULE | Freq: Three times a day (TID) | ORAL | 5 refills | Status: DC | PRN

## 2021-08-26 MED ORDER — MYCOPHENOLATE MOFETIL 250 MG PO CAPS: 250.0000 mg | ORAL_CAPSULE | Freq: Two times a day (BID) | ORAL | 11 refills | Status: DC

## 2021-08-26 NOTE — Progress Notes (Signed)
? ?Chief Complaint  ?Patient presents with  ? New Patient (Initial Visit)  ?  Rm , alone. Pt referred for L leg weakness. Pt reports having leg weakness/spasms, 2 days after getting her first COVID booster. Pt prescribed methocarbamol and carisoprodol and both ineffective and still having muscle spasm in L leg.   ? ? ? ? ?ASSESSMENT AND PLAN ? ?[name] is a 88 y.o. female   ?Thoracic myelopathy ? Involving T6 level ? Patient did report significant improvement following steroid treatment, still have residual mild to moderate bilateral hip flexion weakness, gait abnormality, not a good candidate for long-term steroid treatment due to her age, diabetes, ? After discussed with patient, we decided to proceed with CellCept 250 mg twice a day ? Repeat laboratory for baseline ? Return to clinic in 2 to 3 months ? ? ?DIAGNOSTIC DATA (LABS, IMAGING, TESTING) ?- I reviewed patient records, labs, notes, testing and imaging myself where available. ? ? ?MEDICAL HISTORY: ? ?[name] is a 88 year old female, accompanied by her friends Lelon Frohlich, seen in request by Dr.  Florene Glen, Kendra Opitz., for evaluation of left leg weakness, gait abnormality, her primary care physician is Dr. Crist Infante, initial evaluation was on August 26, 2021. ? ?I reviewed and summarized the referring note. PMHx. Ann ?HTN ?Hx of left breast cancer, s/p lobectomy, radiation,  ? ?She lives alone, independent, ambulate without assistant, denies any gait abnormality prior to symptom onset in February. ? ?She received COVID booster shot in her right deltoid region on February 4, on July 04, 2021, she noticed lower abdominal discomfort, ? ?When she woke up from overnight sleep on February 7, she felt generalized weakness, especially left lower extremity, she could not support her body weight of her left leg, called 911, leading to hospital admission at [facility] long [facility] ? ?MRI of thoracic spine with without contrast on February 9 reviewed multilevel  degenerative changes, short segment patchy normal extensible, noncompressive increased T2 signal in the anterior cord at T6, preferentially involving left side, no contrast-enhancement ? ?MRI of the brain, cervical spine showed no significant abnormality ? ?Repeat MRI of thoracic spine without contrast following Solu-Medrol infusion for 4 days showed restricted diffusion at the site of ventral cord T2 signal abnormality running the length of the T6 vertebral body ? ?Following IV steroid treatment, she did notice improvement, she can walk with assistant, after physical therapy, her gait continues to improve, at home, she can walk furniture holding ? ?Spinal fluid testing on July 08, 2021, bloody tap, slight elevated total protein 46, pink, hazy, RBC 2330, WBC 2, 94% of neutrophil, 0 oligoclonal banding ?A1c 3 years ago was 7.8, in February was 6.3, WBC initially was normal, hemoglobin of 12.9, mildly elevated CBC following steroid treatment, normal CMP with exception of slight elevated creatinine 1.04, normal CPK ? ?Lipid panel LDL 139 triglyceride 185, cholesterol 232 ? ? ?PHYSICAL EXAM: ?  ? ?  07/12/2021  ?  4:43 AM 07/11/2021  ?  7:42 PM 07/11/2021  ?  1:34 PM  ?Vitals with BMI  ?Systolic 937 902 409  ?Diastolic 71 77 71  ?Pulse 82 97 99  ?  ? ?PHYSICAL EXAMNIATION: ? ?Gen: NAD, conversant, well nourised, well groomed                     ?Cardiovascular: Regular rate rhythm, no peripheral edema, warm, nontender. ?Eyes: Conjunctivae clear without exudates or hemorrhage ?Neck: Supple, no carotid bruits. ?Pulmonary: Clear to  auscultation bilaterally  ? ?NEUROLOGICAL EXAM: ? ?MENTAL STATUS: ?Speech: ?   Speech is normal; fluent and spontaneous with normal comprehension.  ?Cognition: ?    Orientation to time, place and person ?    Normal recent and remote memory ?    Normal Attention span and concentration ?    Normal Language, naming, repeating,spontaneous speech ?    Fund of knowledge ?  ?CRANIAL NERVES: ?CN II:  Visual fields are full to confrontation. Pupils are round equal and briskly reactive to light. ?CN III, IV, VI: extraocular movement are normal. No ptosis. ?CN V: Facial sensation is intact to light touch ?CN VII: Face is symmetric with normal eye closure  ?CN VIII: Hearing is normal to causal conversation. ?CN IX, X: Phonation is normal. ?CN XI: Head turning and shoulder shrug are intact ? ?MOTOR: Normal range of motion of bilateral shoulder, but mild right shoulder abduction, external rotation weakness, mild symmetric bilateral hip flexion weakness ? ?REFLEXES: ?Reflexes are 2+ and symmetric at the biceps, triceps, knees, and ankles. Plantar responses are flexor bilaterally ? ?SENSORY: Length-dependent decreased light touch, pinprick, vibratory sensation to mid shin level ? ?COORDINATION: ?There is no trunk or limb dysmetria noted. ? ?GAIT/STANCE: She needs push-up to get up from seated position, stiff, cautious unsteady gait ? ?REVIEW OF SYSTEMS:  ?Full 14 system review of systems performed and notable only for as above ?All other review of systems were negative. ? ? ?ALLERGIES: ?Allergies  ?Allergen Reactions  ? Covid-19 (Mrna Bivalent) Vaccine Therapist, music) [Covid-19 (Mrna) Vaccine]   ? ? ?HOME MEDICATIONS: ?Current Outpatient Medications  ?Medication Sig Dispense Refill  ? ALPRAZolam (XANAX) 0.25 MG tablet Take 0.25 mg by mouth at bedtime.    ? amLODipine (NORVASC) 5 MG tablet Take 0.5 tablets (2.5 mg total) by mouth daily.    ? Ascorbic Acid (VITAMIN C) 500 MG tablet Take 500 mg by mouth daily.      ? aspirin 325 MG EC tablet Take 325 mg by mouth daily.    ? calcium carbonate (TUMS - DOSED IN MG ELEMENTAL CALCIUM) 500 MG chewable tablet Chew 1 tablet by mouth at bedtime.    ? carisoprodol (SOMA) 350 MG tablet Take 1 tablet by mouth as needed. For muscle spasms    ? Cholecalciferol (VITAMIN D) 1000 UNITS capsule Take 1,000 Units by mouth daily.      ? Coenzyme Q10 (CO Q-10) 200 MG CAPS Take 1 capsule by mouth  daily.     ? ezetimibe (ZETIA) 10 MG tablet Take 10 mg by mouth daily.  2  ? ibuprofen (ADVIL) 200 MG tablet Take 200 mg by mouth at bedtime as needed.    ? methocarbamol (ROBAXIN) 500 MG tablet Take 500 mg by mouth every 6 (six) hours as needed. For muscle spasm    ? metoprolol succinate (TOPROL-XL) 25 MG 24 hr tablet TAKE 1/2 TABLET BY MOUTH DAILY (Patient taking differently: Take 12.5 mg by mouth daily.) 15 tablet 0  ? mirabegron ER (MYRBETRIQ) 50 MG TB24 tablet Take 50 mg by mouth daily. Temporary    ? Misc Natural Products (GLUCOSAMINE CHONDROITIN ADV PO) Take 1 tablet by mouth daily.    ? Multiple Vitamin (MULTIVITAMIN) tablet Take 1 tablet by mouth daily.      ? Multiple Vitamins-Minerals (ICAPS AREDS 2 PO) Take by mouth.    ? omeprazole (PRILOSEC) 20 MG capsule Take 1 capsule (20 mg total) by mouth daily. 90 capsule 3  ? PREVIDENT 5000 BOOSTER PLUS 1.1 %  PSTE Place onto teeth 2 (two) times daily.    ? raloxifene (EVISTA) 60 MG tablet TAKE 1 TABLET BY MOUTH ONCE DAILY (Patient taking differently: Take 60 mg by mouth daily.) 90 tablet 1  ? vitamin B-12 (CYANOCOBALAMIN) 500 MCG tablet Take 500 mcg by mouth daily.     ? ?No current facility-administered medications for this visit.  ? ? ?PAST MEDICAL HISTORY: ?Past Medical History:  ?Diagnosis Date  ? Atherosclerosis of aorta ([place])   ? Breast cancer ([place]) 1986  ? left ,s/p radiation  ? Diverticulosis   ? GERD (gastroesophageal reflux disease)   ? Hiatal hernia   ? Hypercholesteremia   ? Hypertension   ? Osteopenia   ? Osteoporosis   ? ? ?PAST SURGICAL HISTORY: ?Past Surgical History:  ?Procedure Laterality Date  ? ABDOMINAL HYSTERECTOMY    ? CATARACT EXTRACTION Bilateral   ? IR RADIOLOGIST EVAL & MGMT  01/03/2018  ? IR RADIOLOGIST EVAL & MGMT  01/31/2018  ? IR RADIOLOGIST EVAL & MGMT  02/27/2018  ? IR RADIOLOGIST EVAL & MGMT  03/20/2018  ? IR RADIOLOGIST EVAL & MGMT  04/03/2018  ? MASTECTOMY  05/29/1997  ? LEFT  ? TONSILLECTOMY    ? age 17  ? ? ?FAMILY  HISTORY: ?Family History  ?Problem Relation Age of Onset  ? Hypertension Mother   ? ALS Father   ? Arthritis Father   ? Stroke Father   ? ? ?SOCIAL HISTORY: ?Social History  ? ?Socioeconomic History  ? Marital status: Si

## 2021-08-27 LAB — COMPREHENSIVE METABOLIC PANEL
ALT: 17 IU/L (ref 0–32)
AST: 17 IU/L (ref 0–40)
Albumin/Globulin Ratio: 2 (ref 1.2–2.2)
Albumin: 4.5 g/dL (ref 3.5–4.6)
Alkaline Phosphatase: 56 IU/L (ref 44–121)
BUN/Creatinine Ratio: 22 (ref 12–28)
BUN: 20 mg/dL (ref 10–36)
Bilirubin Total: 0.2 mg/dL (ref 0.0–1.2)
CO2: 23 mmol/L (ref 20–29)
Calcium: 9.5 mg/dL (ref 8.7–10.3)
Chloride: 97 mmol/L (ref 96–106)
Creatinine, Ser: 0.93 mg/dL (ref 0.57–1.00)
Globulin, Total: 2.3 g/dL (ref 1.5–4.5)
Glucose: 119 mg/dL — ABNORMAL HIGH (ref 70–99)
Potassium: 4.5 mmol/L (ref 3.5–5.2)
Sodium: 134 mmol/L (ref 134–144)
Total Protein: 6.8 g/dL (ref 6.0–8.5)
eGFR: 58 mL/min/{1.73_m2} — ABNORMAL LOW (ref >59)

## 2021-08-27 LAB — CBC WITH DIFFERENTIAL/PLATELET

## 2021-08-27 LAB — VITAMIN D 25 HYDROXY (VIT D DEFICIENCY, FRACTURES): Vit D, 25-Hydroxy: 45 ng/mL (ref 30.0–100.0)

## 2021-08-27 LAB — RPR: RPR Ser Ql: NONREACTIVE

## 2021-08-27 LAB — VITAMIN B12: Vitamin B-12: 1772 pg/mL — ABNORMAL HIGH (ref 232–1245)

## 2021-08-27 LAB — TSH: TSH: 2.03 u[IU]/mL (ref 0.450–4.500)

## 2021-08-29 ENCOUNTER — Telehealth: Payer: Self-pay | Admitting: Neurology

## 2021-08-29 NOTE — Telephone Encounter (Signed)
I called patient, left a detailed message on patient's home phone, per DPR, advising of that labs showed no significant abnormality. I asked patient to call us back with questions or concerns. ?

## 2021-08-29 NOTE — Telephone Encounter (Signed)
Please call patient, laboratory evaluation showed no significant abnormality. 

## 2021-09-05 ENCOUNTER — Telehealth: Payer: Self-pay | Admitting: Neurology

## 2021-09-05 NOTE — Telephone Encounter (Signed)
Pt has called to report that she is unable to take the mycophenolate (CELLCEPT) 250 MG capsule.  Pt states she gets general discomfort from this medication and the side effects are very concerning to her.  Please call ?

## 2021-09-05 NOTE — Addendum Note (Signed)
Addended by: Noberto Retort C on: 09/05/2021 04:16 PM ? ? Modules accepted: Orders ? ?

## 2021-09-05 NOTE — Telephone Encounter (Signed)
Attempted to call pt, LVM for call back  °

## 2021-09-05 NOTE — Telephone Encounter (Signed)
I spoke to patient. She only took several days of Cellcept '250mg'$ , one tab BID. She stopped the medication due to dizziness, worsening weakness, depressed, just "did not feel right". Additionally, she read the medication insert and the side effects scared her. She understands oral steroids are not a good option, long term, due to her age and diabetes. She has continued taking gabapentin 17m two capsules TID PRN. Feels better since stopping the CellCept. She is convinced these symptoms are related to tAvery Dennisonbooster she received 07/03/2021. She does not wish to add any new medications. She will keep her follow up on 10/06/21. She will call [place] before then if she has any new concerns.  ?

## 2021-10-06 ENCOUNTER — Encounter: Payer: Self-pay | Admitting: Neurology

## 2021-10-06 ENCOUNTER — Ambulatory Visit: Payer: Medicare Other | Admitting: Neurology

## 2021-10-06 VITALS — BP 158/76 | HR 106 | Ht 59.0 in | Wt 89.5 lb

## 2021-10-06 DIAGNOSIS — M4714 Other spondylosis with myelopathy, thoracic region: Secondary | ICD-10-CM

## 2021-10-06 DIAGNOSIS — R252 Cramp and spasm: Secondary | ICD-10-CM | POA: Diagnosis not present

## 2021-10-06 DIAGNOSIS — R269 Unspecified abnormalities of gait and mobility: Secondary | ICD-10-CM | POA: Diagnosis not present

## 2021-10-06 MED ORDER — METHYLPREDNISOLONE 4 MG PO TBPK: ORAL_TABLET | ORAL | 0 refills | Status: DC

## 2021-10-06 NOTE — Progress Notes (Signed)
? ?Chief Complaint  ?Patient presents with  ? Follow-up  ?  Rm 14. Accompanied by friend, Opal Sidles. ?Discuss medication. Discontinued gabapentin and myrbetriq. Pt continues to c/o spasms that keep her up at night, causing day time fatigue. She states she feels weak and shaky.  ? ? ? ? ?ASSESSMENT AND PLAN ? ?[name] is a 88 y.o. female   ?Thoracic myelopathy in February 2023 ?Bilateral lower extremity spasticity ? Involving T6 level ? Patient did report significant improvement following hospital IV steroid treatment, still have residual mild bilateral hip flexion weakness, gait abnormality, ? Following initial visit in March 2023, attempt to start CellCept and low-dose 250 mg twice a day, she tried few doses, worried about the side effect, decided to stop taking it, ? Gabapentin does help her lower extremity spasticity, but she worried about the side effect, also complains of morning hang over, no longer taking it, ? She has steady progress over the past few months, but is not back to her baseline level, desire another short dose of steroid package, Medrol pack prescription, ? Continue moderate exercise ? Return to clinic in 4 months ? ? ?DIAGNOSTIC DATA (LABS, IMAGING, TESTING) ?- I reviewed patient records, labs, notes, testing and imaging myself where available. ? ? ?MEDICAL HISTORY: ? ?[name] is a 88 year old female, accompanied by her friends Lelon Frohlich, seen in request by Dr.  Florene Glen, Kendra Opitz., for evaluation of left leg weakness, gait abnormality, her primary care physician is Dr. Crist Infante, initial evaluation was on August 26, 2021. ? ?I reviewed and summarized the referring note. PMHx. Ann ?HTN ?Hx of left breast cancer, s/p lobectomy, radiation,  ? ?She lives alone, independent, ambulate without assistant, denies any gait abnormality prior to symptom onset in February. ? ?She received COVID booster shot at her right deltoid region on February 4, 2 days later on February 6,  she noticed lower  abdominal discomfort, ? ?When she woke up from overnight sleep on February 7, she felt generalized weakness, especially left lower extremity, she could not support her body weight of her left leg, called 911, leading to hospital admission at [facility] hospital ? ?MRI of thoracic spine with without contrast on February 9 reviewed multilevel degenerative changes, short segment patchy normal extensible, noncompressive increased T2 signal in the anterior cord at T6, preferentially involving left side, no contrast-enhancement ? ?MRI of the brain, cervical spine showed no significant abnormality ? ?Repeat MRI of thoracic spine without contrast following Solu-Medrol infusion for 4 days showed restricted diffusion at the site of ventral cord T2 signal abnormality running the length of the T6 vertebral body ? ?Following IV steroid treatment, she did notice improvement, she can walk with assistant, after physical therapy, her gait continues to improve, at home, she can walk furniture holding ? ?Spinal fluid testing on July 08, 2021, bloody tap, slight elevated total protein 46, pink, hazy, RBC 2330, WBC 2, 94% of neutrophil, 0 oligoclonal banding ?A1c 3 years ago was 7.8, in February was 6.3, WBC initially was normal, hemoglobin of 12.9, mildly elevated CBC following steroid treatment, normal CMP with exception of slight elevated creatinine 1.04, normal CPK ? ?Lipid panel LDL 139 triglyceride 185, cholesterol 232 ? ?UPDATE Oct 06 2021: ?She is accompanied by her friends at today's visit, she continued to improve, but not back to her baseline, reliance on her walker ? ?She has no urinary or bowel incontinence, leg gets weaker ? ?She had her start trial of CellCept 250 mg  twice a day for a week initial evaluation in March 2023, worried about the side effect, she has stopped taking that ? ?She complains of lower extremity spasticity difficulty sleeping, even wake her up from sleep sometimes, she was on gabapentin 100 mg up  to 2 tablets every night, it does help her muscle spasm, she can sleep better, but she complains of next morning hangover, has stopped taking that, she has been on Xanax 0.25 mg every night for many years, ? ?She is now back driving short distance, but need help to grocery shopping ? ?Laboratory evaluations in March 2020, normal RPR TSH, B12 vitamin D, CBC, mild elevation of glucose 119 on CMP, reported most recent UA evaluation by PA showed no evidence of UTI ? ?PHYSICAL EXAM: ?  ? ?  10/06/2021  ?  1:20 PM 07/12/2021  ?  4:43 AM 07/11/2021  ?  7:42 PM  ?Vitals with BMI  ?Height '4\' 11"'$     ?Weight 89 lbs 8 oz    ?BMI 18.07    ?Systolic 101 751 025  ?Diastolic 76 71 77  ?Pulse 106 82 97  ?  ? ?PHYSICAL EXAMNIATION: ? ?Gen: NAD, conversant, well nourised, well groomed                     ?Cardiovascular: Regular rate rhythm, no peripheral edema, warm, nontender. ?Eyes: Conjunctivae clear without exudates or hemorrhage ?Neck: Supple, no carotid bruits. ?Pulmonary: Clear to auscultation bilaterally  ? ?NEUROLOGICAL EXAM: ? ?MENTAL STATUS: ?Speech/cognition: ?Awake, alert oriented to history taking care of conversation ?  ?CRANIAL NERVES: ?CN II: Visual fields are full to confrontation. Pupils are round equal and briskly reactive to light. ?CN III, IV, VI: extraocular movement are normal. No ptosis. ?CN V: Facial sensation is intact to light touch ?CN VII: Face is symmetric with normal eye closure  ?CN VIII: Hearing is normal to causal conversation. ?CN IX, X: Phonation is normal. ?CN XI: Head turning and shoulder shrug are intact ? ?MOTOR: Mild right shoulder pain, mild right shoulder abduction, external rotation weakness, mild symmetric bilateral hip flexion weakness ? ?REFLEXES: ?Reflexes are 2+ and symmetric at the biceps, triceps, knees, and ankles. Plantar responses are flexor bilaterally ? ?SENSORY: Length-dependent decreased light touch, pinprick, vibratory sensation to mid shin level ? ?COORDINATION: ?There is no  trunk or limb dysmetria noted. ? ?GAIT/STANCE: She needs push-up to get up from seated position, stiff, cautious mildly unsteady gait ? ?REVIEW OF SYSTEMS:  ?Full 14 system review of systems performed and notable only for as above ?All other review of systems were negative. ? ? ?ALLERGIES: ?Allergies  ?Allergen Reactions  ? Cellcept [Mycophenolate] Other (See Comments)  ?  dizziness, worsening weakness, depressed, just "did not feel right".  ? Covid-19 Teacher, music) Vaccine Therapist, music) [Covid-19 (Mrna) Vaccine]   ? Gabapentin Other (See Comments)  ?  depression  ? ? ?HOME MEDICATIONS: ?Current Outpatient Medications  ?Medication Sig Dispense Refill  ? ALPRAZolam (XANAX) 0.25 MG tablet Take 0.25 mg by mouth at bedtime.    ? amLODipine (NORVASC) 5 MG tablet Take 0.5 tablets (2.5 mg total) by mouth daily.    ? Ascorbic Acid (VITAMIN C) 500 MG tablet Take 500 mg by mouth daily.      ? aspirin 325 MG EC tablet Take 325 mg by mouth daily.    ? calcium elemental as carbonate (BARIATRIC TUMS ULTRA) 400 MG chewable tablet Chew 1 tablet by mouth at bedtime.    ? carisoprodol (SOMA)  350 MG tablet Take 1 tablet by mouth as needed. For muscle spasms    ? Cholecalciferol (VITAMIN D) 1000 UNITS capsule Take 1,000 Units by mouth daily.      ? Coenzyme Q10 (CO Q-10) 200 MG CAPS Take 1 capsule by mouth daily.     ? CYCLOBENZAPRINE HCL PO Take by mouth.    ? ezetimibe (ZETIA) 10 MG tablet Take 10 mg by mouth daily.  2  ? ibuprofen (ADVIL) 200 MG tablet Take 200 mg by mouth at bedtime as needed.    ? metoprolol succinate (TOPROL-XL) 25 MG 24 hr tablet TAKE 1/2 TABLET BY MOUTH DAILY (Patient taking differently: Take 12.5 mg by mouth daily.) 15 tablet 0  ? Misc Natural Products (GLUCOSAMINE CHONDROITIN ADV PO) Take 1 tablet by mouth daily.    ? Multiple Vitamin (MULTIVITAMIN) tablet Take 1 tablet by mouth daily.      ? Multiple Vitamins-Minerals (ICAPS AREDS 2 PO) Take by mouth.    ? omeprazole (PRILOSEC) 20 MG capsule Take 1 capsule (20  mg total) by mouth daily. 90 capsule 3  ? PREVIDENT 5000 BOOSTER PLUS 1.1 % PSTE Place onto teeth 2 (two) times daily.    ? raloxifene (EVISTA) 60 MG tablet TAKE 1 TABLET BY MOUTH ONCE DAILY (Patient takin

## 2021-10-08 ENCOUNTER — Other Ambulatory Visit: Payer: Self-pay | Admitting: Psychiatry

## 2021-10-08 MED ORDER — METHYLPREDNISOLONE 4 MG PO TBPK: ORAL_TABLET | ORAL | 0 refills | Status: DC

## 2021-10-10 ENCOUNTER — Other Ambulatory Visit: Payer: Self-pay

## 2021-10-10 MED ORDER — METHYLPREDNISOLONE 4 MG PO TBPK: ORAL_TABLET | ORAL | 0 refills | Status: DC

## 2021-10-10 NOTE — Progress Notes (Signed)
Pharmacy requesting new rx. ?

## 2021-10-11 ENCOUNTER — Telehealth: Payer: Self-pay | Admitting: Neurology

## 2021-10-11 MED ORDER — METHYLPREDNISOLONE 4 MG PO TBPK: ORAL_TABLET | ORAL | 0 refills | Status: DC

## 2021-10-11 NOTE — Addendum Note (Signed)
Addended by: Cristela Felt E on: 10/11/2021 04:11 PM ? ? Modules accepted: Orders ? ?

## 2021-10-11 NOTE — Telephone Encounter (Signed)
Pt states she is still waiting on her methylPREDNISolone (MEDROL DOSEPAK) 4 MG TBPK tablet.  Pt states she has been told by  [facility] [phone] that there is a discrepancy in the dosage of this medication, please call. ?

## 2021-10-11 NOTE — Telephone Encounter (Signed)
Rx sent with addendum to directions as per Dr. Krista Blue. ?

## 2021-10-11 NOTE — Telephone Encounter (Signed)
Fax was sent by pharmacist requesting clarification for this prescription. Sig: "Take as instructed by packaging" was not accepted. They would like clarification on quantity and dosage. ?

## 2021-10-12 NOTE — Telephone Encounter (Signed)
I spoke with the patient. She was able to pick up the prescription. She expressed appreciation for the call. ?

## 2021-10-25 ENCOUNTER — Ambulatory Visit: Payer: Medicare Other | Admitting: Podiatry

## 2021-10-25 ENCOUNTER — Ambulatory Visit: Payer: Medicare Other

## 2021-10-25 ENCOUNTER — Telehealth: Payer: Self-pay | Admitting: *Deleted

## 2021-10-25 DIAGNOSIS — L03032 Cellulitis of left toe: Secondary | ICD-10-CM | POA: Diagnosis not present

## 2021-10-25 DIAGNOSIS — L97521 Non-pressure chronic ulcer of other part of left foot limited to breakdown of skin: Secondary | ICD-10-CM

## 2021-10-25 DIAGNOSIS — M79672 Pain in left foot: Secondary | ICD-10-CM

## 2021-10-25 MED ORDER — MUPIROCIN 2 % EX OINT: 1.0000 "application " | TOPICAL_OINTMENT | Freq: Two times a day (BID) | CUTANEOUS | 2 refills | Status: DC

## 2021-10-25 MED ORDER — CEPHALEXIN 500 MG PO CAPS: 500.0000 mg | ORAL_CAPSULE | Freq: Three times a day (TID) | ORAL | 0 refills | Status: DC

## 2021-10-25 NOTE — Telephone Encounter (Signed)
Patient calling for a sooner appointment , in so much pain and would like to be seen today.  She is scheduled today '@4'$ :00

## 2021-10-27 ENCOUNTER — Ambulatory Visit: Payer: Medicare Other | Admitting: Podiatry

## 2021-11-03 NOTE — Progress Notes (Signed)
Subjective:   Patient ID: [name], female   DOB: 88 y.o.   MRN: [id number]   HPI 88 year old female presents the office with concerns of left second toe pain, ulcer.  She says started getting worse since February.  She did note some redness and swelling.  She started wearing toe caps.  No fevers or chills currently.  States that she had a COVID concerns after that she had inflammation in her spine she was told has had problems with her left leg.   Review of Systems  All other systems reviewed and are negative.       Objective:  Physical Exam  General: AAO x3, NAD  Dermatological: Granular ulceration left second toe there is localized edema and erythema present there is no drainage or pus.  No fluctuance or crepitation.  There is no malodor.  There is no probing, amount or tunneling   Vascular: Dorsalis Pedis artery and Posterior Tibial artery pedal pulses are palpable bilateral with immedate capillary fill time. There is no pain with calf compression, swelling, warmth, erythema.   Neruologic: Grossly intact via light touch bilateral.   Musculoskeletal: Hammertoe present.  Muscular strength 5/5 in all groups tested bilateral.   Assessment:   Ulceration left second toe, cellulitis     Plan:  -Treatment options discussed including all alternatives, risks, and complications -Etiology of symptoms were discussed -Debrided the lesion to any complications.  Antibiotic ointment and dressing changes daily.  Prescribed Keflex.  Offloading.  Monitor for any signs or symptoms of worsening infection report to emergency room should any occur.  ABI.  Return in about 2 weeks (around 11/08/2021) for toe ulcer, infection, x-ray.  Trula Slade DPM

## 2021-11-07 ENCOUNTER — Ambulatory Visit: Payer: Medicare Other | Admitting: Podiatry

## 2021-11-07 ENCOUNTER — Ambulatory Visit (INDEPENDENT_AMBULATORY_CARE_PROVIDER_SITE_OTHER): Payer: Medicare Other

## 2021-11-07 DIAGNOSIS — L03032 Cellulitis of left toe: Secondary | ICD-10-CM

## 2021-11-07 DIAGNOSIS — L97521 Non-pressure chronic ulcer of other part of left foot limited to breakdown of skin: Secondary | ICD-10-CM | POA: Diagnosis not present

## 2021-11-10 ENCOUNTER — Encounter (HOSPITAL_COMMUNITY): Payer: Medicare Other

## 2021-11-14 NOTE — Progress Notes (Signed)
Subjective: 88 year old female presents the office today with concerns and follow-up evaluation of left second toe ulcer.  She states that she is doing better and she is not having any more pain and no significant drainage.  She is in the surgical shoe and she been changing the bandage daily.  No increase in swelling or redness.  She has no fevers or chills.  No other concerns.  Objective: AAO x3, NAD DP/PT pulses palpable bilaterally, CRT less than 3 seconds Hammertoe present resulting ulceration on the dorsal aspect of the second toe.  At best mild hyperkeratotic tissue and upon debridement the wound appears to be healed.  There is no edema, erythema, drainage or pus and there is no fluctuation or crepitation.  There is no malodor.  No new ulcerations present. No pain with calf compression, swelling, warmth, erythema  Assessment: Ulceration due to hammertoe deformity left foot  Plan: -All treatment options discussed with the patient including all alternatives, risks, complications.  -Sharp debride the hyperkeratotic lesion without any complications or bleeding.  There is a wound is healed.  Continue offloading.  Monitor for any signs or symptoms of infection or reoccurrence of the wound.  Discussion modifications to avoid pressure. -Patient encouraged to call the office with any questions, concerns, change in symptoms.   Trula Slade DPM

## 2021-12-07 ENCOUNTER — Telehealth: Payer: Self-pay | Admitting: *Deleted

## 2021-12-26 ENCOUNTER — Ambulatory Visit: Payer: Medicare Other | Admitting: Podiatry

## 2021-12-26 DIAGNOSIS — M2042 Other hammer toe(s) (acquired), left foot: Secondary | ICD-10-CM | POA: Diagnosis not present

## 2021-12-26 NOTE — Patient Instructions (Signed)
Look for EXTRA DEPTH or DOUBLE DEPTH shoe to help take pressure off of the hammertoes.

## 2021-12-26 NOTE — Progress Notes (Unsigned)
Subjective: 88 year old female presents the office today with a friend for evaluation of left second digit hammertoe.  She states there is an uncomfortable knot on the toe pointing the PIPJ.  The wound is healed but is difficult for her to wear shoes and she was to discuss options for this.  Currently no opening or drainage.  Objective: AAO x3, NAD DP/PT pulses palpable bilaterally, CRT less than 3 seconds Rigid hammertoe contractures present on the left second toe.  There is mild erythema on the PIPJ but there is no skin breakdown or warmth.  No open lesions.  She does get discomfort to the toe itself. No pain with calf compression, swelling, warmth, erythema  Assessment: Hammertoe contracture  Plan: -All treatment options discussed with the patient including all alternatives, risks, complications.  -We discussed both conservative as well as surgical treatment options.  She was continue with conservative care.  Discussed offloading pads and discussed wearing extra-depth, double depth shoe.  Monitor for any skin breakdown or recurrence of ulcerations. -Patient encouraged to call the office with any questions, concerns, change in symptoms.   Trula Slade DPM

## 2022-02-14 ENCOUNTER — Ambulatory Visit: Payer: 59 | Admitting: Neurology

## 2022-03-03 NOTE — Telephone Encounter (Signed)
error 

## 2022-04-13 ENCOUNTER — Telehealth: Payer: Self-pay | Admitting: *Deleted

## 2022-04-13 NOTE — Telephone Encounter (Signed)
Patient has a possible toe infection w/ drainage,raw and sensitive. Called patient to schedule appointment, no answer , left message for call back for upcoming appointment.

## 2022-04-17 ENCOUNTER — Telehealth: Payer: Self-pay | Admitting: *Deleted

## 2022-04-17 NOTE — Telephone Encounter (Signed)
Patient has been scheduled for appointment 04/29/22.

## 2022-04-18 ENCOUNTER — Encounter: Payer: Self-pay | Admitting: Neurology

## 2022-04-18 ENCOUNTER — Ambulatory Visit: Payer: Medicare Other | Admitting: Neurology

## 2022-04-18 VITALS — BP 149/78 | HR 94 | Ht <= 58 in | Wt 88.0 lb

## 2022-04-18 DIAGNOSIS — M4714 Other spondylosis with myelopathy, thoracic region: Secondary | ICD-10-CM | POA: Diagnosis not present

## 2022-04-18 DIAGNOSIS — R252 Cramp and spasm: Secondary | ICD-10-CM | POA: Diagnosis not present

## 2022-04-18 DIAGNOSIS — R269 Unspecified abnormalities of gait and mobility: Secondary | ICD-10-CM

## 2022-04-18 MED ORDER — GABAPENTIN 100 MG PO CAPS: 100.0000 mg | ORAL_CAPSULE | Freq: Every day | ORAL | 3 refills | Status: AC

## 2022-04-18 NOTE — Progress Notes (Signed)
Chief Complaint  Patient presents with   Follow-up    Rm 17 alone. Here for 4 month f/u. Pt reports she has been doing well and making progress. Gabpentin has help spasms at night some but symptoms still present.       ASSESSMENT AND PLAN  [name] is a 88 y.o. female   Thoracic myelopathy in February 2023 Bilateral lower extremity spasticity  Involving T6 level  Patient did report significant improvement following hospital IV steroid treatment, still have residual mild bilateral hip flexion weakness, gait abnormality,  Following initial visit in March 2023, attempt to start CellCept and low-dose 250 mg twice a day, she tried few doses, worried about the side effect, decided to stop taking it,  Gabapentin does help her lower extremity spasticity, but she worried about the side effect, also complains of morning hang over, no longer taking it,  She has steady progress over the past few months, but is not back to her baseline level,   Had another round of Medrol pack in May 2023, reported mild improvement since,  Continue to make progress slowly, able to ambulate independently, continue moderate exercise, gabapentin as needed, only return to clinic for new issues   DIAGNOSTIC DATA (LABS, IMAGING, TESTING) - I reviewed patient records, labs, notes, testing and imaging myself where available.   MEDICAL HISTORY:  [name] is a 88 year old female, accompanied by her friends Lelon Frohlich, seen in request by Dr.  Florene Glen, Kendra Opitz., for evaluation of left leg weakness, gait abnormality, her primary care physician is Dr. Crist Infante, initial evaluation was on August 26, 2021.  I reviewed and summarized the referring note. PMHx. Ann HTN Hx of left breast cancer, s/p lobectomy, radiation,   She lives alone, independent, ambulate without assistant, denies any gait abnormality prior to symptom onset in February.  She received COVID booster shot at her right deltoid region on February 4,  2 days later on February 6,  she noticed lower abdominal discomfort,  When she woke up from overnight sleep on February 7, she felt generalized weakness, especially left lower extremity, she could not support her body weight of her left leg, called 911, leading to hospital admission at [facility] long hospital  MRI of thoracic spine with without contrast on February 9 reviewed multilevel degenerative changes, short segment patchy normal extensible, noncompressive increased T2 signal in the anterior cord at T6, preferentially involving left side, no contrast-enhancement  MRI of the brain, cervical spine showed no significant abnormality  Repeat MRI of thoracic spine without contrast following Solu-Medrol infusion for 4 days showed restricted diffusion at the site of ventral cord T2 signal abnormality running the length of the T6 vertebral body  Following IV steroid treatment, she did notice improvement, she can walk with assistant, after physical therapy, her gait continues to improve, at home, she can walk furniture holding  Spinal fluid testing on July 08, 2021, bloody tap, slight elevated total protein 46, pink, hazy, RBC 2330, WBC 2, 94% of neutrophil, 0 oligoclonal banding A1c 3 years ago was 7.8, in February was 6.3, WBC initially was normal, hemoglobin of 12.9, mildly elevated CBC following steroid treatment, normal CMP with exception of slight elevated creatinine 1.04, normal CPK  Lipid panel LDL 139 triglyceride 185, cholesterol 232  UPDATE Oct 06 2021: She is accompanied by her friends at today's visit, she continued to improve, but not back to her baseline, reliance on her walker  She has no urinary or bowel incontinence,  leg gets weaker  She had her start trial of CellCept 250 mg twice a day for a week initial evaluation in March 2023, worried about the side effect, she has stopped taking that  She complains of lower extremity spasticity difficulty sleeping, even wake her up from  sleep sometimes, she was on gabapentin 100 mg up to 2 tablets every night, it does help her muscle spasm, she can sleep better, but she complains of next morning hangover, has stopped taking that, she has been on Xanax 0.25 mg every night for many years,  She is now back driving short distance, but need help to grocery shopping  Laboratory evaluations in March 2020, normal RPR TSH, B12 vitamin D, CBC, mild elevation of glucose 119 on CMP, reported most recent UA evaluation by PA showed no evidence of UTI  UPDATE Apr 18 2022: She drove herself to clinic today.  She was given medrol pack on May 16th 2023 for T6 myelopathy, some improvement.   She is not back to 100% yet, legs are still weak, occasionally muscle spasm at both legs, righ leg numbness,   PHYSICAL EXAM:      04/18/2022    1:47 PM 10/06/2021    1:20 PM 07/12/2021    4:43 AM  Vitals with BMI  Height '4\' 10"'$  '4\' 11"'$    Weight 88 lbs 89 lbs 8 oz   BMI 53.2 99.24   Systolic 268 341 962  Diastolic 78 76 71  Pulse 94 106 82     PHYSICAL EXAMNIATION:  Gen: NAD, conversant, well nourised, well groomed                     Cardiovascular: Regular rate rhythm, no peripheral edema, warm, nontender. Eyes: Conjunctivae clear without exudates or hemorrhage Neck: Supple, no carotid bruits. Pulmonary: Clear to auscultation bilaterally   NEUROLOGICAL EXAM:  MENTAL STATUS: Speech/cognition: Awake, alert oriented to history taking care of conversation   CRANIAL NERVES: CN II: Visual fields are full to confrontation. Pupils are round equal and briskly reactive to light. CN III, IV, VI: extraocular movement are normal. No ptosis. CN V: Facial sensation is intact to light touch CN VII: Face is symmetric with normal eye closure  CN VIII: Hearing is normal to causal conversation. CN IX, X: Phonation is normal. CN XI: Head turning and shoulder shrug are intact  MOTOR: Mild right shoulder pain, mild right shoulder abduction, external  rotation weakness, mild symmetric bilateral hip flexion weakness  REFLEXES: Reflexes are 2+ and symmetric at the biceps, triceps, knees, and ankles. Plantar responses are flexor bilaterally  SENSORY: Length-dependent decreased light touch, pinprick, vibratory sensation to mid shin level  COORDINATION: There is no trunk or limb dysmetria noted.  GAIT/STANCE: She needs push-up to get up from seated position, stiff, cautious mildly unsteady gait  REVIEW OF SYSTEMS:  Full 14 system review of systems performed and notable only for as above All other review of systems were negative.   ALLERGIES: Allergies  Allergen Reactions   Cellcept [Mycophenolate] Other (See Comments)    dizziness, worsening weakness, depressed, just "did not feel right".   Covid-19 (Mrna Bivalent) Vaccine Therapist, music) [Covid-19 (Mrna) Vaccine]    Gabapentin Other (See Comments)    depression    HOME MEDICATIONS: Current Outpatient Medications  Medication Sig Dispense Refill   ALPRAZolam (XANAX) 0.25 MG tablet Take 0.25 mg by mouth at bedtime.     amLODipine (NORVASC) 5 MG tablet Take 0.5 tablets (2.5 mg total) by  mouth daily.     Ascorbic Acid (VITAMIN C) 500 MG tablet Take 500 mg by mouth daily.       aspirin 325 MG EC tablet Take 325 mg by mouth daily.     calcium elemental as carbonate (BARIATRIC TUMS ULTRA) 400 MG chewable tablet Chew 1 tablet by mouth at bedtime.     carisoprodol (SOMA) 350 MG tablet Take 1 tablet by mouth as needed. For muscle spasms     cephALEXin (KEFLEX) 500 MG capsule Take 1 capsule (500 mg total) by mouth 3 (three) times daily. 21 capsule 0   Cholecalciferol (VITAMIN D) 1000 UNITS capsule Take 1,000 Units by mouth daily.       Coenzyme Q10 (CO Q-10) 200 MG CAPS Take 1 capsule by mouth daily.      CYCLOBENZAPRINE HCL PO Take by mouth.     ezetimibe (ZETIA) 10 MG tablet Take 10 mg by mouth daily.  2   gabapentin (NEURONTIN) 100 MG capsule 100 mg at bedtime.     ibuprofen (ADVIL) 200 MG  tablet Take 200 mg by mouth at bedtime as needed.     methylPREDNISolone (MEDROL DOSEPAK) 4 MG TBPK tablet Taper off tablets, starting with 6 tablets today, then, 5, 4, 3, 2, 1. 21 tablet 0   metoprolol succinate (TOPROL-XL) 25 MG 24 hr tablet TAKE 1/2 TABLET BY MOUTH DAILY (Patient taking differently: Take 12.5 mg by mouth daily.) 15 tablet 0   Misc Natural Products (GLUCOSAMINE CHONDROITIN ADV PO) Take 1 tablet by mouth daily.     Multiple Vitamin (MULTIVITAMIN) tablet Take 1 tablet by mouth daily.       Multiple Vitamins-Minerals (ICAPS AREDS 2 PO) Take by mouth.     mupirocin ointment (BACTROBAN) 2 % Apply 1 application. topically 2 (two) times daily. 30 g 2   omeprazole (PRILOSEC) 20 MG capsule Take 1 capsule (20 mg total) by mouth daily. 90 capsule 3   PREVIDENT 5000 BOOSTER PLUS 1.1 % PSTE Place onto teeth 2 (two) times daily.     raloxifene (EVISTA) 60 MG tablet TAKE 1 TABLET BY MOUTH ONCE DAILY (Patient taking differently: Take 60 mg by mouth daily.) 90 tablet 1   vitamin B-12 (CYANOCOBALAMIN) 500 MCG tablet Take 500 mcg by mouth daily.      No current facility-administered medications for this visit.    PAST MEDICAL HISTORY: Past Medical History:  Diagnosis Date   Atherosclerosis of aorta ([place])    Breast cancer ([place]) 1986   left ,s/p radiation   Diverticulosis    GERD (gastroesophageal reflux disease)    Hiatal hernia    Hypercholesteremia    Hypertension    Osteopenia    Osteoporosis     PAST SURGICAL HISTORY: Past Surgical History:  Procedure Laterality Date   ABDOMINAL HYSTERECTOMY     CATARACT EXTRACTION Bilateral    IR RADIOLOGIST EVAL & MGMT  01/03/2018   IR RADIOLOGIST EVAL & MGMT  01/31/2018   IR RADIOLOGIST EVAL & MGMT  02/27/2018   IR RADIOLOGIST EVAL & MGMT  03/20/2018   IR RADIOLOGIST EVAL & MGMT  04/03/2018   MASTECTOMY  05/29/1997   LEFT   TONSILLECTOMY     age 52    FAMILY HISTORY: Family History  Problem Relation Age of Onset   Hypertension  Mother    ALS Father    Arthritis Father    Stroke Father     SOCIAL HISTORY: Social History   Socioeconomic History   Marital status: Single  Spouse name: Not on file   Number of children: Not on file   Years of education: college   Highest education level: Not on file  Occupational History   Not on file  Tobacco Use   Smoking status: Never   Smokeless tobacco: Never  Substance and Sexual Activity   Alcohol use: Not Currently   Drug use: Never   Sexual activity: Not on file  Other Topics Concern   Not on file  Social History Narrative   Not on file   Social Determinants of Health   Financial Resource Strain: Not on file  Food Insecurity: Not on file  Transportation Needs: Not on file  Physical Activity: Not on file  Stress: Not on file  Social Connections: Not on file  Intimate Partner Violence: Not on file      Marcial Pacas, M.D. Ph.D.  [facility] [address] [place], [place] [address] Ph: [phone] Fax: [phone]  CC:  Crist Infante, MD [place],  [place] [phone]  Crist Infante, MD

## 2022-04-29 ENCOUNTER — Ambulatory Visit: Payer: Medicare Other | Admitting: Podiatry

## 2022-04-29 DIAGNOSIS — L84 Corns and callosities: Secondary | ICD-10-CM | POA: Diagnosis not present

## 2022-04-29 DIAGNOSIS — M2042 Other hammer toe(s) (acquired), left foot: Secondary | ICD-10-CM | POA: Diagnosis not present

## 2022-05-01 ENCOUNTER — Telehealth: Payer: Self-pay | Admitting: *Deleted

## 2022-05-01 NOTE — Telephone Encounter (Signed)
Patient is calling for status of a medication that was supposed to be sent to pharmacy. Called her back to make sure that she had received, no answer, left message to call back if not.

## 2022-05-02 ENCOUNTER — Telehealth: Payer: Self-pay | Admitting: *Deleted

## 2022-05-02 ENCOUNTER — Other Ambulatory Visit: Payer: Self-pay | Admitting: Podiatry

## 2022-05-02 MED ORDER — DICLOFENAC SODIUM 1 % EX GEL: 2.0000 g | Freq: Every day | CUTANEOUS | 2 refills | Status: DC | PRN

## 2022-05-02 NOTE — Progress Notes (Signed)
Subjective: Chief Complaint  Patient presents with   Hammer Toe    Left foot 2nd hammertoe, patient is still having some pain,     88 year old female presents the office today with a friend for evaluation of left second digit hammertoe.  She states that she still gets soreness on the toe.  She does not see any drainage or pus.  Given the hammertoes becoming more of an issue.  The top of the toe still rubs.  No fevers or chills.   Objective: AAO x3, NAD DP/PT pulses palpable bilaterally, CRT less than 3 seconds Rigid hammertoe contractures present on the left second toe.  There is mild erythema on the PIPJ from irritation.  There is a preulcerative lesion on the dorsal PIPJ.  The toe is rigid.  There is no drainage or pus.  There is no fluctuation or crepitation.  There is no malodor.  No pain with calf compression, swelling, warmth, erythema  Assessment: Hammertoe contracture resulted in preulcerative lesion  Plan: -All treatment options discussed with the patient including all alternatives, risks, complications.  -We discussed both conservative as well as surgical treatment options.  Unfortunate given the rigid hammertoes.  Difficult to treat this without surgery.  Continue with open toe extra-depth/double duct shoes.  I dispensed more of a 2 part of the toe to see if this will help give him more cushion when she is wearing a shoe.  She is to monitor very closely for any further skin breakdown or any signs or symptoms of infection.   Trula Slade DPM

## 2022-05-02 NOTE — Telephone Encounter (Signed)
Patient is calling for status of something to help with pain to rub on the toes(is not comfortable).she said that this was discussed during that time. Not seeing anything in office notes of this being mentioned.

## 2022-05-05 ENCOUNTER — Telehealth: Payer: Self-pay | Admitting: *Deleted

## 2022-05-05 MED ORDER — DICLOFENAC SODIUM 1 % EX GEL: 2.0000 g | Freq: Every day | CUTANEOUS | 2 refills | Status: DC | PRN

## 2022-05-05 NOTE — Telephone Encounter (Signed)
Patient did not received as of yet , resent today

## 2022-06-09 ENCOUNTER — Ambulatory Visit: Payer: Medicare Other

## 2022-06-09 ENCOUNTER — Ambulatory Visit: Payer: Medicare Other | Admitting: Podiatry

## 2022-06-09 DIAGNOSIS — L97522 Non-pressure chronic ulcer of other part of left foot with fat layer exposed: Secondary | ICD-10-CM | POA: Diagnosis not present

## 2022-06-09 DIAGNOSIS — L03032 Cellulitis of left toe: Secondary | ICD-10-CM | POA: Diagnosis not present

## 2022-06-09 MED ORDER — CEPHALEXIN 500 MG PO CAPS: 500.0000 mg | ORAL_CAPSULE | Freq: Three times a day (TID) | ORAL | 0 refills | Status: DC

## 2022-06-09 NOTE — Progress Notes (Unsigned)
Subjective: Chief Complaint  Patient presents with   Follow-up    Follow up on the left foot, 2nd digit     89 year old female presents the office today with a friend for evaluation of left second digit hammertoe.  She states that she has noticed a wound following the second toe once been very tender.  She is been applying Voltaren gel to this.  She says she has some clear drainage but no pus.  She has had swelling and redness to the toe.  Denies any fevers or chills.  She has no other concerns today.     Objective: AAO x3, NAD DP/PT pulses palpable bilaterally, CRT less than 3 seconds On the dorsal aspect of the right second digit is a full-thickness granular wound with localized edema and erythema present of the toe and small mount of clear drainage there is no purulence.  There is no probing to bone or exposed bone or tendon.  Rigid hammertoe contractures present.  There is no fluctuance or crepitation but there is no malodor. No pain with calf compression, swelling, warmth, erythema  Assessment: Hammertoe contracture resulted in ulceration 2nd toe  Plan: -All treatment options discussed with the patient including all alternatives, risks, complications.  -X-rays were obtained reviewed.  3 views of the foot were obtained.  There is no definitive cortical destruction suggestive of osteomyelitis at the time.  No soft tissue edema. -High concern of the wound.  Given the infection prescribed Keflex.  Recommend daily dressing changes with Medihoney.  I would not recommend applying Voltaren gel to the open wound.  Offloading at all times. -Discussed surgical intervention as she is continuing to have recurrent ulcerations and pain to the second toe.  Unfortunately the wound she is at risk of amputation. -Monitor for any clinical signs or symptoms of infection and directed to call the office immediately should any occur or go to the ER.  Return in about 1 week (around 06/16/2022) for wound,  infection .  Trula Slade DPM

## 2022-06-19 ENCOUNTER — Ambulatory Visit: Payer: Medicare Other | Admitting: Podiatry

## 2022-06-19 DIAGNOSIS — L97522 Non-pressure chronic ulcer of other part of left foot with fat layer exposed: Secondary | ICD-10-CM | POA: Diagnosis not present

## 2022-06-19 MED ORDER — DOXYCYCLINE HYCLATE 100 MG PO TABS: 100.0000 mg | ORAL_TABLET | Freq: Two times a day (BID) | ORAL | 0 refills | Status: DC

## 2022-06-20 ENCOUNTER — Other Ambulatory Visit: Payer: Self-pay

## 2022-06-20 ENCOUNTER — Telehealth: Payer: Self-pay | Admitting: *Deleted

## 2022-06-20 ENCOUNTER — Other Ambulatory Visit: Payer: Self-pay | Admitting: Podiatry

## 2022-06-20 DIAGNOSIS — L97522 Non-pressure chronic ulcer of other part of left foot with fat layer exposed: Secondary | ICD-10-CM

## 2022-06-20 NOTE — Progress Notes (Signed)
error 

## 2022-06-20 NOTE — Telephone Encounter (Signed)
You put in the order [facility] not [facility] the referral is for [facility] not the order. Can you change it to match the referral?

## 2022-06-20 NOTE — Telephone Encounter (Signed)
Patient is calling for the status of a [facility] ,wanted to know the name of the facility that would be calling her, please advise.

## 2022-06-21 ENCOUNTER — Other Ambulatory Visit: Payer: Self-pay | Admitting: Podiatry

## 2022-06-21 DIAGNOSIS — L97522 Non-pressure chronic ulcer of other part of left foot with fat layer exposed: Secondary | ICD-10-CM

## 2022-06-21 NOTE — Progress Notes (Signed)
Subjective: Chief Complaint  Patient presents with   Wound Check    Left foot      89 year old female presents the office today for evaluation of left second digit hammertoe.  She states that she has noticed a wound following the second toe once been very tender.  She states still tender.  Hard to keep a bandage on so she is keeping just a silicone sleeve over top of the toe, ulcer.  No purulence.  She does get pain to the foot and she uses Voltaren gel on this.  No fevers or chills.     Objective: AAO x3, NAD DP/PT pulses palpable bilaterally, CRT less than 3 seconds On the dorsal aspect of the right second digit is a full-thickness granular wound with localized edema and erythema present of the toe.  There is no drainage or pus.  No erythema.  No fluctuance or crepitation there is no malodor.  No exposed bone although it is closed. No pain with calf compression, swelling, warmth, erythema  Assessment: Hammertoe contracture resulted in ulceration 2nd toe  Plan: -All treatment options discussed with the patient including all alternatives, risks, complications.  -High concern of the wound still we discussed risk of amputation.  Given the infection prescribed doxycycline and she completed the Keflex without significant improvement.  Recommend daily dressing changes with Iodosorb.  I would not recommend applying Voltaren gel to the open wound.  Offloading at all times.  I also keep a bandage on the wound did not use just the silicone toe sleeve. -ABI ordered -Monitor for any clinical signs or symptoms of infection and directed to call the office immediately should any occur or go to the ER.  Return in about 1 week (around 06/26/2022).  Trula Slade DPM

## 2022-06-22 NOTE — Telephone Encounter (Signed)
Patient is scheduled for tomorrow.

## 2022-06-23 ENCOUNTER — Ambulatory Visit (HOSPITAL_COMMUNITY)
Admission: RE | Admit: 2022-06-23 | Discharge: 2022-06-23 | Disposition: A | Discharge status: Home / Self Care | Payer: Medicare Other | Source: Ambulatory Visit | Attending: Podiatry | Admitting: Podiatry

## 2022-06-23 DIAGNOSIS — L97522 Non-pressure chronic ulcer of other part of left foot with fat layer exposed: Secondary | ICD-10-CM | POA: Diagnosis present

## 2022-06-27 ENCOUNTER — Ambulatory Visit (INDEPENDENT_AMBULATORY_CARE_PROVIDER_SITE_OTHER): Payer: Medicare Other | Admitting: Podiatry

## 2022-06-27 DIAGNOSIS — L97522 Non-pressure chronic ulcer of other part of left foot with fat layer exposed: Secondary | ICD-10-CM | POA: Diagnosis not present

## 2022-06-27 DIAGNOSIS — L03032 Cellulitis of left toe: Secondary | ICD-10-CM

## 2022-06-27 MED ORDER — DOXYCYCLINE HYCLATE 100 MG PO TABS: 100.0000 mg | ORAL_TABLET | Freq: Two times a day (BID) | ORAL | 0 refills | Status: DC

## 2022-07-03 ENCOUNTER — Ambulatory Visit: Payer: Medicare Other | Admitting: Surgery

## 2022-07-03 ENCOUNTER — Encounter: Payer: Self-pay | Admitting: Surgery

## 2022-07-03 VITALS — BP 123/73 | HR 91 | Temp 98.0°F | Resp 20 | Ht <= 58 in | Wt 87.6 lb

## 2022-07-03 DIAGNOSIS — I7025 Atherosclerosis of native arteries of other extremities with ulceration: Secondary | ICD-10-CM | POA: Diagnosis not present

## 2022-07-03 NOTE — Progress Notes (Signed)
Vascular and Vein Specialist of [facility]  Patient name: [name] MRN: [id number] DOB: 1928-07-28 Sex: female   REQUESTING PROVIDER:    Dr. Jacqualyn Posey   REASON FOR CONSULT:    PAD  HISTORY OF PRESENT ILLNESS:   [name] is a 89 y.o. female, who is referred for evaluation of lower extremity vascular disease in the setting of a left second toe wound.  She states that she has had a knot on her toe from dropping something on it for approximately 20 years.  She apparently has developed a hammertoe and since at least September has had a wound.  She recently saw Dr. Jacqualyn Posey with [facility] to start her on antibiotics and wound care.  She states that it is getting smaller.  She does not have any significant pain.  She comes in today for further vascular evaluation.  She is a non-smoker.  She is medically managed for hypertension.  She has a history of left-sided breast cancer treated with radiation.  She is on Zetia for hypercholesterolemia.  PAST MEDICAL HISTORY    Past Medical History:  Diagnosis Date   Atherosclerosis of aorta ([place])    Breast cancer ([place]) 1986   left ,s/p radiation   Diverticulosis    GERD (gastroesophageal reflux disease)    Hiatal hernia    Hypercholesteremia    Hypertension    Osteopenia    Osteoporosis      FAMILY HISTORY   Family History  Problem Relation Age of Onset   Hypertension Mother    ALS Father    Arthritis Father    Stroke Father     SOCIAL HISTORY:   Social History   Socioeconomic History   Marital status: Single    Spouse name: Not on file   Number of children: Not on file   Years of education: college   Highest education level: Not on file  Occupational History   Not on file  Tobacco Use   Smoking status: Never   Smokeless tobacco: Never  Substance and Sexual Activity   Alcohol use: Not Currently   Drug use: Never   Sexual activity: Not on file  Other Topics Concern   Not on file   Social History Narrative   Not on file   Social Determinants of Health   Financial Resource Strain: Not on file  Food Insecurity: Not on file  Transportation Needs: Not on file  Physical Activity: Not on file  Stress: Not on file  Social Connections: Not on file  Intimate Partner Violence: Not on file    ALLERGIES:    Allergies  Allergen Reactions   Cellcept [Mycophenolate] Other (See Comments)    dizziness, worsening weakness, depressed, just "did not feel right".   Covid-19 (Mrna Bivalent) Vaccine Therapist, music) [Covid-19 (Mrna) Vaccine]    Gabapentin Other (See Comments)    depression    CURRENT MEDICATIONS:    Current Outpatient Medications  Medication Sig Dispense Refill   ALPRAZolam (XANAX) 0.25 MG tablet Take 0.25 mg by mouth at bedtime.     amLODipine (NORVASC) 5 MG tablet Take 0.5 tablets (2.5 mg total) by mouth daily.     Ascorbic Acid (VITAMIN C) 500 MG tablet Take 500 mg by mouth daily.       aspirin 325 MG EC tablet Take 325 mg by mouth daily.     calcium elemental as carbonate (BARIATRIC TUMS ULTRA) 400 MG chewable tablet Chew 1 tablet by mouth at bedtime.  carisoprodol (SOMA) 350 MG tablet Take 1 tablet by mouth as needed. For muscle spasms     cephALEXin (KEFLEX) 500 MG capsule Take 1 capsule (500 mg total) by mouth 3 (three) times daily. 21 capsule 0   Cholecalciferol (VITAMIN D) 1000 UNITS capsule Take 1,000 Units by mouth daily.       Coenzyme Q10 (CO Q-10) 200 MG CAPS Take 1 capsule by mouth daily.      CYCLOBENZAPRINE HCL PO Take by mouth.     diclofenac Sodium (VOLTAREN) 1 % GEL Apply 2 g topically daily as needed. 100 g 2   doxycycline (VIBRA-TABS) 100 MG tablet Take 1 tablet (100 mg total) by mouth 2 (two) times daily. 14 tablet 0   ezetimibe (ZETIA) 10 MG tablet Take 10 mg by mouth daily.  2   gabapentin (NEURONTIN) 100 MG capsule Take 1 capsule (100 mg total) by mouth at bedtime. 90 capsule 3   ibuprofen (ADVIL) 200 MG tablet Take 200 mg by mouth  at bedtime as needed.     methylPREDNISolone (MEDROL DOSEPAK) 4 MG TBPK tablet Taper off tablets, starting with 6 tablets today, then, 5, 4, 3, 2, 1. 21 tablet 0   metoprolol succinate (TOPROL-XL) 25 MG 24 hr tablet TAKE 1/2 TABLET BY MOUTH DAILY (Patient taking differently: Take 12.5 mg by mouth daily.) 15 tablet 0   Misc Natural Products (GLUCOSAMINE CHONDROITIN ADV PO) Take 1 tablet by mouth daily.     Multiple Vitamin (MULTIVITAMIN) tablet Take 1 tablet by mouth daily.       Multiple Vitamins-Minerals (ICAPS AREDS 2 PO) Take by mouth.     mupirocin ointment (BACTROBAN) 2 % Apply 1 application. topically 2 (two) times daily. 30 g 2   omeprazole (PRILOSEC) 20 MG capsule Take 1 capsule (20 mg total) by mouth daily. 90 capsule 3   PREVIDENT 5000 BOOSTER PLUS 1.1 % PSTE Place onto teeth 2 (two) times daily.     raloxifene (EVISTA) 60 MG tablet TAKE 1 TABLET BY MOUTH ONCE DAILY (Patient taking differently: Take 60 mg by mouth daily.) 90 tablet 1   vitamin B-12 (CYANOCOBALAMIN) 500 MCG tablet Take 500 mcg by mouth daily.      No current facility-administered medications for this visit.    REVIEW OF SYSTEMS:   '[X]'$  denotes positive finding, '[ ]'$  denotes negative finding Cardiac  Comments:  Chest pain or chest pressure:    Shortness of breath upon exertion:    Short of breath when lying flat:    Irregular heart rhythm:        Vascular    Pain in calf, thigh, or hip brought on by ambulation:    Pain in feet at night that wakes you up from your sleep:     Blood clot in your veins:    Leg swelling:         Pulmonary    Oxygen at home:    Productive cough:     Wheezing:         Neurologic    Sudden weakness in arms or legs:     Sudden numbness in arms or legs:     Sudden onset of difficulty speaking or slurred speech:    Temporary loss of vision in one eye:     Problems with dizziness:         Gastrointestinal    Blood in stool:      Vomited blood:         Genitourinary  Burning when urinating:     Blood in urine:        Psychiatric    Major depression:         Hematologic    Bleeding problems:    Problems with blood clotting too easily:        Skin    Rashes or ulcers:        Constitutional    Fever or chills:     PHYSICAL EXAM:   Vitals:   07/03/22 1455  BP: 123/73  Pulse: 91  Resp: 20  Temp: 98 F (36.7 C)  SpO2: 98%  Weight: 87 lb 9.6 oz (39.7 kg)  Height: '4\' 10"'$  (1.473 m)    GENERAL: The patient is a well-nourished female, in no acute distress. The vital signs are documented above. CARDIAC: There is a regular rate and rhythm.  VASCULAR: Palpable left posterior tibial pulse, nonpalpable dorsalis pedis PULMONARY: Nonlabored respirations MUSCULOSKELETAL: There are no major deformities or cyanosis. NEUROLOGIC: No focal weakness or paresthesias are detected. SKIN: Photo below PSYCHIATRIC: The patient has a normal affect.  STUDIES:   I have reviewed the following: ABI Findings:  +---------+------------------+-----+--------+--------+  Right   Rt Pressure (mmHg)IndexWaveformComment   +---------+------------------+-----+--------+--------+  Brachial 134                                      +---------+------------------+-----+--------+--------+  PTA     139               1.04 biphasic          +---------+------------------+-----+--------+--------+  DP      156               1.16 biphasic          +---------+------------------+-----+--------+--------+  Great Toe88                0.66 Abnormal          +---------+------------------+-----+--------+--------+   +---------+------------------+-----+---------+-------+  Left    Lt Pressure (mmHg)IndexWaveform Comment  +---------+------------------+-----+---------+-------+  Brachial 131                                      +---------+------------------+-----+---------+-------+  PTA     162               1.21 triphasic          +---------+------------------+-----+---------+-------+  DP      151               1.13 biphasic          +---------+------------------+-----+---------+-------+  Great Toe79                0.59 Abnormal           ASSESSMENT and PLAN   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  I offered her a angiogram, however she is a little reluctant because of her age to proceed with anything at this time.  She also feels that the wound is getting slightly better.  As a compromise, I told her that she should continue wound care for now and follow-up with me  again in 1 month.  If she has not made significant progress, the neck step would be angiography.   Leia Alf, MD, FACS [facility] [phone] Pager [phone]

## 2022-07-04 NOTE — Progress Notes (Signed)
Subjective: Chief Complaint  Patient presents with   Foot Ulcer    Left toe ulcer, patient states she is taking antibiotics as ordered. Feels the ulcer is improving at this time.      89 year old female presents the office today for the above concerns.  She states that is getting a little bit better and she is still on antibiotics.  The pain to her foot is improved.  She denies any fevers or chills.  She does get some drainage but no pus.  States is still difficult to keep the bandage on the toe.   Objective: AAO x3, NAD DP/PT pulses palpable bilaterally, CRT less than 3 seconds On the dorsal aspect of the right second digit is a full-thickness granular wound with localized edema and erythema present of the toe.  There does appear to be some increased granulation tissue in the wound appears to be slightly smaller today.  There is no exposed bone although it is close to the PIPJ.  Moderate erythema without any ascending cellulitis.  There is no fluctuance or crepitation.  There is no malodor today.  No pain with calf compression, swelling, warmth, erythema  Assessment: Hammertoe contracture resulted in ulceration 2nd toe  Plan: -All treatment options discussed with the patient including all alternatives, risks, complications.  -High concern of the wound still we discussed risk of amputation although wound does appear to be somewhat improved today.  I would like to continue antibiotics and I refilled them today.   Recommend continue with daily dressing changes with Iodosorb.   Offloading at all times.  I also keep a bandage on the wound did not use just the silicone toe sleeve. -ABI reviewed with the patient.  TBI is abnormal ABI within normal range.  Given longstanding nature of the wound she does have a follow-up with vascular surgery as scheduled. -Monitor for any clinical signs or symptoms of infection and directed to call the office immediately should any occur or go to the ER.  Return  in about 1 week (around 06/26/2022).  Trula Slade DPM

## 2022-07-06 ENCOUNTER — Ambulatory Visit: Payer: Medicare Other | Admitting: Podiatry

## 2022-07-06 DIAGNOSIS — L97522 Non-pressure chronic ulcer of other part of left foot with fat layer exposed: Secondary | ICD-10-CM

## 2022-07-06 DIAGNOSIS — I739 Peripheral vascular disease, unspecified: Secondary | ICD-10-CM

## 2022-07-06 MED ORDER — DOXYCYCLINE HYCLATE 100 MG PO TABS: 100.0000 mg | ORAL_TABLET | Freq: Two times a day (BID) | ORAL | 0 refills | Status: DC

## 2022-07-12 NOTE — Progress Notes (Signed)
Subjective: Chief Complaint  Patient presents with   Wound Check    Follow up, left foot, 2nd digit     89 year old female presents the office today for the above concerns.  She did follow-up with vascular surgery.  She was accompanied to the angiogram and they recommended.  She thinks the toe is getting a little bit better.  She has not seen drainage or pus.  She tried to get a bandage on it but is difficult.  No fevers or chills.  No other concerns.     Objective: AAO x3, NAD DP/PT pulses palpable bilaterally, CRT less than 3 seconds On the dorsal aspect of the right second digit is a full-thickness granular wound with localized edema and erythema present of the toe.  There is increased granulation tissue present.  Overall the wounds look better but there is 1 area that probes to bone today.  No fluctuance or crepitation.  There is no malodor. No pain with calf compression, swelling, warmth, erythema  Assessment: Hammertoe contracture resulted in ulceration 2nd toe  Plan: -All treatment options discussed with the patient including all alternatives, risks, complications.  -Overall the wound looks better but there is 1 area that actually probes to bone today.  I do think she will need surgery lesion arthroplasty remove this however would like to get her circulation evaluated.  After discussion she is in agreement to the angio.  Will defer to vascular surgery for this.  For now continue trying to keep a bandage on the toe as well as very difficult for her.Continue antibiotics as well.  Doxycycline refilled. -Open toe/offloading shoe -Monitor for any clinical signs or symptoms of infection and directed to call the office immediately should any occur or go to the ER.  Trula Slade DPM

## 2022-07-14 ENCOUNTER — Other Ambulatory Visit: Payer: Self-pay

## 2022-07-14 DIAGNOSIS — I739 Peripheral vascular disease, unspecified: Secondary | ICD-10-CM

## 2022-07-14 DIAGNOSIS — L97522 Non-pressure chronic ulcer of other part of left foot with fat layer exposed: Secondary | ICD-10-CM

## 2022-07-18 ENCOUNTER — Ambulatory Visit: Payer: Medicare Other | Admitting: Podiatry

## 2022-07-18 VITALS — BP 149/79 | HR 102 | Temp 97.6°F

## 2022-07-18 DIAGNOSIS — I739 Peripheral vascular disease, unspecified: Secondary | ICD-10-CM | POA: Diagnosis not present

## 2022-07-18 DIAGNOSIS — L97522 Non-pressure chronic ulcer of other part of left foot with fat layer exposed: Secondary | ICD-10-CM | POA: Diagnosis not present

## 2022-07-18 DIAGNOSIS — M2042 Other hammer toe(s) (acquired), left foot: Secondary | ICD-10-CM

## 2022-07-18 MED ORDER — DOXYCYCLINE HYCLATE 100 MG PO TABS: 100.0000 mg | ORAL_TABLET | Freq: Two times a day (BID) | ORAL | 0 refills | Status: DC

## 2022-07-25 NOTE — Progress Notes (Signed)
Subjective: Chief Complaint  Patient presents with   Wound Check    Left foot 2nd toe ulcer check. Patient states that the foot is more pain than before. Patient states that the pain in in different parts of the foot at different times..      89 year old female presents the office today for the above concerns.  She states that she is eager to get the angio performed.  Toe is doing little bit better but the wound still remains.  Denies any drainage or pus.  No increase swelling or redness.  No fevers or chills.   Objective: AAO x3, NAD DP/PT pulses palpable bilaterally, CRT less than 3 seconds On the dorsal aspect of the right second digit is a full-thickness granular wound with localized edema and erythema present of the toe.  There is increased granulation tissue present.  There is still 1 area that probes to bone although soft.  Minimal.  There is no fluctuance or crepitation.  No malodor.   Rigid hammertoe present. No pain with calf compression, swelling, warmth, erythema  Assessment: Hammertoe contracture resulted in ulceration 2nd toe  Plan: -All treatment options discussed with the patient including all alternatives, risks, complications.  -Overall the wound is doing a little better but there is 1 area that actually probes to bone today.  I do think she will need surgery lesion arthroplasty remove this however would like to get her circulation evaluated.  After discussion she is in agreement to the angio.  Will defer to vascular surgery for this.  For now continue trying to keep a bandage on the toe as well as very difficult for her.Continue antibiotics as well.  Doxycycline refilled. -Open toe/offloading shoe -Doxycyline  -Monitor for any clinical signs or symptoms of infection and directed to call the office immediately should any occur or go to the ER.  Trula Slade DPM

## 2022-07-27 ENCOUNTER — Ambulatory Visit: Payer: Medicare Other | Admitting: Podiatry

## 2022-07-27 DIAGNOSIS — I739 Peripheral vascular disease, unspecified: Secondary | ICD-10-CM

## 2022-07-27 DIAGNOSIS — L97524 Non-pressure chronic ulcer of other part of left foot with necrosis of bone: Secondary | ICD-10-CM

## 2022-07-27 MED ORDER — AMOXICILLIN-POT CLAVULANATE 875-125 MG PO TABS: 1.0000 | ORAL_TABLET | Freq: Two times a day (BID) | ORAL | 0 refills | Status: DC

## 2022-07-29 NOTE — Progress Notes (Signed)
Subjective: Chief Complaint  Patient presents with   Wound Check    1 week wound f/u     89 year old female presents the office today for the above concerns.  She is chronically vascular surgery next week.  The wound is still present on the second toe but may be doing a little bit better.  She is still on antibiotics.  She denies any increase in swelling or redness.  She has no fevers or chills.  She has no other concerns today.   Objective: AAO x3, NAD DP/PT pulses palpable bilaterally, CRT less than 3 seconds On the dorsal aspect of the right second digit is a full-thickness granular wound with localized edema and erythema present of the toe.  Small piece of the proximal phalanx head is exposed.  Wound itself is more granular and there is no drainage or pus noted today.  There is no fluctuance or crepitation.  There is no malodor. No pain with calf compression, swelling, warmth, erythema  Assessment: Hammertoe contracture resulted in ulceration 2nd toe  Plan: -All treatment options discussed with the patient including all alternatives, risks, complications.  -Wound may be slightly better with increased granulation tissue present but still present a small piece of the proximal phalanx head is exposed.  She does endorse burning pain to her feet at nighttime which could be neuropathy but also concern for PAD.  She is scheduled to see vascular surgery next week and she is in agreement to proceed with angio given the ongoing ulceration.  She previously had a wound on the toe that is healed uneventfully without any issues at this time and it is not healing.  She would likely need to undergo some type of surgery either arthroplasty or amputation.  Will wait until vascular evaluation.  For now continue antibiotics which were refilled today. -Monitor for any clinical signs or symptoms of infection and directed to call the office immediately should any occur or go to the ER.  No follow-ups on  file.  Trula Slade DPM

## 2022-07-31 ENCOUNTER — Encounter: Payer: Self-pay | Admitting: Surgery

## 2022-07-31 ENCOUNTER — Ambulatory Visit: Payer: Medicare Other | Admitting: Surgery

## 2022-07-31 VITALS — BP 120/71 | HR 102 | Temp 97.9°F | Resp 20 | Ht <= 58 in | Wt 86.0 lb

## 2022-07-31 DIAGNOSIS — I7025 Atherosclerosis of native arteries of other extremities with ulceration: Secondary | ICD-10-CM

## 2022-07-31 NOTE — H&P (View-Only) (Signed)
 [facility]ist of [place]  Patient name: [name] MRN: [id number] DOB: 03/16/1929 Sex: female   REASON FOR VISIT:    Follow up  HISOTRY OF PRESENT ILLNESS:    [name] is a 89 y.o. female who I met in February 2024 for evaluation of a left second toe wound. She states that she has had a knot on her toe from dropping something on it approximately 20 years ago. She apparently has developed a hammertoe and since at least September, 2023, she has had a wound.  She recently follows with Dr. Wagoner with [facility].  When I saw her, she felt that the wound was getting smaller.  She was without significant pain.  She did have a palpable posterior tibial pulse however her toe pressures were slightly diminished.  I offered her an arteriogram to definitively evaluate her blood flow, however she was reluctant to proceed because of her age coupled with the fact that she felt her wound was a little better.  Therefore I decided to bring her back in 1 month to see if she has made any progress.  She is here today for that reason.  She saw Dr. Wagoner recently as well.  The wound may be slightly red but has not really healed and she is having worsening pain  She is a non-smoker. She is medically managed for hypertension. She has a history of left-sided breast cancer treated with radiation. She is on Zetia for hypercholesterolemia.   PAST MEDICAL HISTORY:   Past Medical History:  Diagnosis Date   Atherosclerosis of aorta (HCC)    Breast cancer (HCC) 1986   left ,s/p radiation   Diverticulosis    GERD (gastroesophageal reflux disease)    Hiatal hernia    Hypercholesteremia    Hypertension    Osteopenia    Osteoporosis      FAMILY HISTORY:   Family History  Problem Relation Age of Onset   Hypertension Mother    ALS Father    Arthritis Father    Stroke Father     SOCIAL HISTORY:   Social History   Tobacco Use   Smoking  status: Never   Smokeless tobacco: Never  Substance Use Topics   Alcohol use: Not Currently     ALLERGIES:   Allergies  Allergen Reactions   Cellcept [Mycophenolate] Other (See Comments)    dizziness, worsening weakness, depressed, just "did not feel right".   Covid-19 (Mrna Bivalent) Vaccine (Pfizer) [Covid-19 (Mrna) Vaccine]    Gabapentin Other (See Comments)    depression     CURRENT MEDICATIONS:   Current Outpatient Medications  Medication Sig Dispense Refill   ALPRAZolam (XANAX) 0.25 MG tablet Take 0.25 mg by mouth at bedtime.     amLODipine (NORVASC) 5 MG tablet Take 0.5 tablets (2.5 mg total) by mouth daily.     amoxicillin-clavulanate (AUGMENTIN) 875-125 MG tablet Take 1 tablet by mouth 2 (two) times daily. 20 tablet 0   Ascorbic Acid (VITAMIN C) 500 MG tablet Take 500 mg by mouth daily.       aspirin 325 MG EC tablet Take 325 mg by mouth daily.     calcium elemental as carbonate (BARIATRIC TUMS ULTRA) 400 MG chewable tablet Chew 1 tablet by mouth at bedtime.     Cholecalciferol (VITAMIN D) 1000 UNITS capsule Take 1,000 Units by mouth daily.       Coenzyme Q10 (CO Q-10) 200 MG CAPS Take 1 capsule by mouth   daily.      diclofenac Sodium (VOLTAREN) 1 % GEL Apply 2 g topically daily as needed. 100 g 2   ezetimibe (ZETIA) 10 MG tablet Take 10 mg by mouth daily.  2   gabapentin (NEURONTIN) 100 MG capsule Take 1 capsule (100 mg total) by mouth at bedtime. 90 capsule 3   ibuprofen (ADVIL) 200 MG tablet Take 200 mg by mouth at bedtime as needed.     metoprolol succinate (TOPROL-XL) 25 MG 24 hr tablet TAKE 1/2 TABLET BY MOUTH DAILY (Patient taking differently: Take 12.5 mg by mouth daily.) 15 tablet 0   Misc Natural Products (GLUCOSAMINE CHONDROITIN ADV PO) Take 1 tablet by mouth daily.     Multiple Vitamin (MULTIVITAMIN) tablet Take 1 tablet by mouth daily.       Multiple Vitamins-Minerals (ICAPS AREDS 2 PO) Take by mouth.     mupirocin ointment (BACTROBAN) 2 % Apply 1  application. topically 2 (two) times daily. 30 g 2   omeprazole (PRILOSEC) 20 MG capsule Take 1 capsule (20 mg total) by mouth daily. 90 capsule 3   PREVIDENT 5000 BOOSTER PLUS 1.1 % PSTE Place onto teeth 2 (two) times daily.     raloxifene (EVISTA) 60 MG tablet TAKE 1 TABLET BY MOUTH ONCE DAILY (Patient taking differently: Take 60 mg by mouth daily.) 90 tablet 1   vitamin B-12 (CYANOCOBALAMIN) 500 MCG tablet Take 500 mcg by mouth daily.      No current facility-administered medications for this visit.    REVIEW OF SYSTEMS:   [X] denotes positive finding, [ ] denotes negative finding Cardiac  Comments:  Chest pain or chest pressure:    Shortness of breath upon exertion:    Short of breath when lying flat:    Irregular heart rhythm:        Vascular    Pain in calf, thigh, or hip brought on by ambulation:    Pain in feet at night that wakes you up from your sleep:     Blood clot in your veins:    Leg swelling:         Pulmonary    Oxygen at home:    Productive cough:     Wheezing:         Neurologic    Sudden weakness in arms or legs:     Sudden numbness in arms or legs:     Sudden onset of difficulty speaking or slurred speech:    Temporary loss of vision in one eye:     Problems with dizziness:         Gastrointestinal    Blood in stool:     Vomited blood:         Genitourinary    Burning when urinating:     Blood in urine:        Psychiatric    Major depression:         Hematologic    Bleeding problems:    Problems with blood clotting too easily:        Skin    Rashes or ulcers:        Constitutional    Fever or chills:      PHYSICAL EXAM:   Vitals:   07/31/22 1433  BP: 120/71  Pulse: (!) 102  Resp: 20  Temp: 97.9 F (36.6 C)  SpO2: 95%  Weight: 86 lb (39 kg)  Height: 4' 10" (1.473 m)    GENERAL: The patient is a well-nourished female,   in no acute distress. The vital signs are documented above. CARDIAC: There is a regular rate and rhythm.   VASCULAR: I could not palpate her left posterior tibial pulse today which was palpable at her first visit PULMONARY: Non-labored respirations MUSCULOSKELETAL: There are no major deformities or cyanosis. NEUROLOGIC: No focal weakness or paresthesias are detected. SKIN: See photo below PSYCHIATRIC: The patient has a normal affect.  STUDIES:   ABI Findings:  +---------+------------------+-----+--------+--------+  Right   Rt Pressure (mmHg)IndexWaveformComment   +---------+------------------+-----+--------+--------+  Brachial 134                                      +---------+------------------+-----+--------+--------+  PTA     139               1.04 biphasic          +---------+------------------+-----+--------+--------+  DP      156               1.16 biphasic          +---------+------------------+-----+--------+--------+  Great Toe88                0.66 Abnormal          +---------+------------------+-----+--------+--------+   +---------+------------------+-----+---------+-------+  Left    Lt Pressure (mmHg)IndexWaveform Comment  +---------+------------------+-----+---------+-------+  Brachial 131                                      +---------+------------------+-----+---------+-------+  PTA     162               1.21 triphasic         +---------+------------------+-----+---------+-------+  DP      151               1.13 biphasic          +---------+------------------+-----+---------+-------+  Great Toe79                0.59 Abnormal          +---------+------------------+-----+---------+-------+    MEDICAL ISSUES:    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  I discussed with the patient that since she has not made significant improvement over the course of the past month that we need to proceed with angiography to define her anatomy and intervene  if indicated.  I offered her tomorrow but she would prefer to do it 1 week from tomorrow on Tuesday, March 12.  This will be from a right femoral access.  I will intervene if indicated.  She is going to need to spend the night after her procedure for monitoring as there is nobody available to do so at home.   Wells Zymiere Trostle, IV, MD, FACS [facility] [place] Tel [phone] Pager [phone] 

## 2022-07-31 NOTE — Progress Notes (Signed)
[facility] and [facility] Specialist of [facility]  Patient name: [name] MRN: [id number] DOB: June 25, 1928 Sex: female   REASON FOR VISIT:    Follow up  HISOTRY OF PRESENT ILLNESS:    [name] is a 89 y.o. female who I met in February 2024 for evaluation of a left second toe wound. She states that she has had a knot on her toe from dropping something on it approximately 20 years ago. She apparently has developed a hammertoe and since at least September, 2023, she has had a wound.  She recently follows with Dr. Jacqualyn Posey with [facility].  When I saw her, she felt that the wound was getting smaller.  She was without significant pain.  She did have a palpable posterior tibial pulse however her toe pressures were slightly diminished.  I offered her an arteriogram to definitively evaluate her blood flow, however she was reluctant to proceed because of her age coupled with the fact that she felt her wound was a little better.  Therefore I decided to bring her back in 1 month to see if she has made any progress.  She is here today for that reason.  She saw Dr. Jacqualyn Posey recently as well.  The wound may be slightly red but has not really healed and she is having worsening pain  She is a non-smoker. She is medically managed for hypertension. She has a history of left-sided breast cancer treated with radiation. She is on Zetia for hypercholesterolemia.   PAST MEDICAL HISTORY:   Past Medical History:  Diagnosis Date   Atherosclerosis of aorta ([place])    Breast cancer ([place]) 1986   left ,s/p radiation   Diverticulosis    GERD (gastroesophageal reflux disease)    Hiatal hernia    Hypercholesteremia    Hypertension    Osteopenia    Osteoporosis      FAMILY HISTORY:   Family History  Problem Relation Age of Onset   Hypertension Mother    ALS Father    Arthritis Father    Stroke Father     SOCIAL HISTORY:   Social History   Tobacco Use   Smoking  status: Never   Smokeless tobacco: Never  Substance Use Topics   Alcohol use: Not Currently     ALLERGIES:   Allergies  Allergen Reactions   Cellcept [Mycophenolate] Other (See Comments)    dizziness, worsening weakness, depressed, just "did not feel right".   Covid-19 (Mrna Bivalent) Vaccine Therapist, music) [Covid-19 (Mrna) Vaccine]    Gabapentin Other (See Comments)    depression     CURRENT MEDICATIONS:   Current Outpatient Medications  Medication Sig Dispense Refill   ALPRAZolam (XANAX) 0.25 MG tablet Take 0.25 mg by mouth at bedtime.     amLODipine (NORVASC) 5 MG tablet Take 0.5 tablets (2.5 mg total) by mouth daily.     amoxicillin-clavulanate (AUGMENTIN) 875-125 MG tablet Take 1 tablet by mouth 2 (two) times daily. 20 tablet 0   Ascorbic Acid (VITAMIN C) 500 MG tablet Take 500 mg by mouth daily.       aspirin 325 MG EC tablet Take 325 mg by mouth daily.     calcium elemental as carbonate (BARIATRIC TUMS ULTRA) 400 MG chewable tablet Chew 1 tablet by mouth at bedtime.     Cholecalciferol (VITAMIN D) 1000 UNITS capsule Take 1,000 Units by mouth daily.       Coenzyme Q10 (CO Q-10) 200 MG CAPS Take 1 capsule by mouth  daily.      diclofenac Sodium (VOLTAREN) 1 % GEL Apply 2 g topically daily as needed. 100 g 2   ezetimibe (ZETIA) 10 MG tablet Take 10 mg by mouth daily.  2   gabapentin (NEURONTIN) 100 MG capsule Take 1 capsule (100 mg total) by mouth at bedtime. 90 capsule 3   ibuprofen (ADVIL) 200 MG tablet Take 200 mg by mouth at bedtime as needed.     metoprolol succinate (TOPROL-XL) 25 MG 24 hr tablet TAKE 1/2 TABLET BY MOUTH DAILY (Patient taking differently: Take 12.5 mg by mouth daily.) 15 tablet 0   Misc Natural Products (GLUCOSAMINE CHONDROITIN ADV PO) Take 1 tablet by mouth daily.     Multiple Vitamin (MULTIVITAMIN) tablet Take 1 tablet by mouth daily.       Multiple Vitamins-Minerals (ICAPS AREDS 2 PO) Take by mouth.     mupirocin ointment (BACTROBAN) 2 % Apply 1  application. topically 2 (two) times daily. 30 g 2   omeprazole (PRILOSEC) 20 MG capsule Take 1 capsule (20 mg total) by mouth daily. 90 capsule 3   PREVIDENT 5000 BOOSTER PLUS 1.1 % PSTE Place onto teeth 2 (two) times daily.     raloxifene (EVISTA) 60 MG tablet TAKE 1 TABLET BY MOUTH ONCE DAILY (Patient taking differently: Take 60 mg by mouth daily.) 90 tablet 1   vitamin B-12 (CYANOCOBALAMIN) 500 MCG tablet Take 500 mcg by mouth daily.      No current facility-administered medications for this visit.    REVIEW OF SYSTEMS:   '[X]'$  denotes positive finding, '[ ]'$  denotes negative finding Cardiac  Comments:  Chest pain or chest pressure:    Shortness of breath upon exertion:    Short of breath when lying flat:    Irregular heart rhythm:        Vascular    Pain in calf, thigh, or hip brought on by ambulation:    Pain in feet at night that wakes you up from your sleep:     Blood clot in your veins:    Leg swelling:         Pulmonary    Oxygen at home:    Productive cough:     Wheezing:         Neurologic    Sudden weakness in arms or legs:     Sudden numbness in arms or legs:     Sudden onset of difficulty speaking or slurred speech:    Temporary loss of vision in one eye:     Problems with dizziness:         Gastrointestinal    Blood in stool:     Vomited blood:         Genitourinary    Burning when urinating:     Blood in urine:        Psychiatric    Major depression:         Hematologic    Bleeding problems:    Problems with blood clotting too easily:        Skin    Rashes or ulcers:        Constitutional    Fever or chills:      PHYSICAL EXAM:   Vitals:   07/31/22 1433  BP: 120/71  Pulse: (!) 102  Resp: 20  Temp: 97.9 F (36.6 C)  SpO2: 95%  Weight: 86 lb (39 kg)  Height: '4\' 10"'$  (1.473 m)    GENERAL: The patient is a well-nourished female,  in no acute distress. The vital signs are documented above. CARDIAC: There is a regular rate and rhythm.   VASCULAR: I could not palpate her left posterior tibial pulse today which was palpable at her first visit PULMONARY: Non-labored respirations MUSCULOSKELETAL: There are no major deformities or cyanosis. NEUROLOGIC: No focal weakness or paresthesias are detected. SKIN: See photo below PSYCHIATRIC: The patient has a normal affect.  STUDIES:   ABI Findings:  +---------+------------------+-----+--------+--------+  Right   Rt Pressure (mmHg)IndexWaveformComment   +---------+------------------+-----+--------+--------+  Brachial 134                                      +---------+------------------+-----+--------+--------+  PTA     139               1.04 biphasic          +---------+------------------+-----+--------+--------+  DP      156               1.16 biphasic          +---------+------------------+-----+--------+--------+  Great Toe88                0.66 Abnormal          +---------+------------------+-----+--------+--------+   +---------+------------------+-----+---------+-------+  Left    Lt Pressure (mmHg)IndexWaveform Comment  +---------+------------------+-----+---------+-------+  Brachial 131                                      +---------+------------------+-----+---------+-------+  PTA     162               1.21 triphasic         +---------+------------------+-----+---------+-------+  DP      151               1.13 biphasic          +---------+------------------+-----+---------+-------+  Great Toe79                0.59 Abnormal          +---------+------------------+-----+---------+-------+    MEDICAL ISSUES:    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  I discussed with the patient that since she has not made significant improvement over the course of the past month that we need to proceed with angiography to define her anatomy and intervene  if indicated.  I offered her tomorrow but she would prefer to do it 1 week from tomorrow on Tuesday, March 12.  This will be from a right femoral access.  I will intervene if indicated.  She is going to need to spend the night after her procedure for monitoring as there is nobody available to do so at home.   Leia Alf, MD, FACS [facility] [phone] Pager [phone]

## 2022-08-01 ENCOUNTER — Telehealth: Payer: Self-pay | Admitting: *Deleted

## 2022-08-01 ENCOUNTER — Other Ambulatory Visit: Payer: Self-pay

## 2022-08-01 DIAGNOSIS — I7025 Atherosclerosis of native arteries of other extremities with ulceration: Secondary | ICD-10-CM

## 2022-08-01 NOTE — Telephone Encounter (Signed)
Patient is calling because the Augmentin is making sick, no appetite, weakness in legs ,having trouble swallowing (large tablets).She discontinued yesterday. Please advise.

## 2022-08-02 ENCOUNTER — Other Ambulatory Visit: Payer: Self-pay | Admitting: Podiatry

## 2022-08-02 MED ORDER — DOXYCYCLINE HYCLATE 100 MG PO TABS: 100.0000 mg | ORAL_TABLET | Freq: Two times a day (BID) | ORAL | 0 refills | Status: DC

## 2022-08-02 NOTE — Telephone Encounter (Signed)
Called patient to update that another medication has been sent to pharmacy,verbalized understanding but wanted to ask if she should keep her upcoming appointment, is having a catheter placed on Monday, please advise

## 2022-08-07 ENCOUNTER — Ambulatory Visit: Payer: Medicare Other | Admitting: Podiatry

## 2022-08-08 ENCOUNTER — Other Ambulatory Visit: Payer: Self-pay

## 2022-08-08 ENCOUNTER — Encounter (HOSPITAL_COMMUNITY)
Admission: RE | Disposition: A | Discharge status: Home / Self Care | Payer: Self-pay | Source: Home / Self Care | Attending: Surgery

## 2022-08-08 ENCOUNTER — Observation Stay (HOSPITAL_COMMUNITY)
Admission: RE | Admit: 2022-08-08 | Discharge: 2022-08-09 | Disposition: A | Discharge status: Home / Self Care | Payer: Medicare Other | Attending: Surgery | Admitting: Surgery

## 2022-08-08 DIAGNOSIS — L89899 Pressure ulcer of other site, unspecified stage: Secondary | ICD-10-CM | POA: Diagnosis not present

## 2022-08-08 DIAGNOSIS — I70245 Atherosclerosis of native arteries of left leg with ulceration of other part of foot: Secondary | ICD-10-CM | POA: Diagnosis not present

## 2022-08-08 DIAGNOSIS — I739 Peripheral vascular disease, unspecified: Secondary | ICD-10-CM | POA: Insufficient documentation

## 2022-08-08 DIAGNOSIS — I10 Essential (primary) hypertension: Secondary | ICD-10-CM | POA: Insufficient documentation

## 2022-08-08 DIAGNOSIS — Z79899 Other long term (current) drug therapy: Secondary | ICD-10-CM | POA: Diagnosis not present

## 2022-08-08 DIAGNOSIS — Z7982 Long term (current) use of aspirin: Secondary | ICD-10-CM | POA: Diagnosis not present

## 2022-08-08 DIAGNOSIS — I7025 Atherosclerosis of native arteries of other extremities with ulceration: Secondary | ICD-10-CM

## 2022-08-08 DIAGNOSIS — Z853 Personal history of malignant neoplasm of breast: Secondary | ICD-10-CM | POA: Diagnosis not present

## 2022-08-08 HISTORY — PX: ABDOMINAL AORTOGRAM W/LOWER EXTREMITY: CATH118223

## 2022-08-08 LAB — POCT I-STAT, CHEM 8
BUN: 16 mg/dL (ref 8–23)
Calcium, Ion: 1.21 mmol/L (ref 1.15–1.40)
Chloride: 104 mmol/L (ref 98–111)
Creatinine, Ser: 0.9 mg/dL (ref 0.44–1.00)
Glucose, Bld: 116 mg/dL — ABNORMAL HIGH (ref 70–99)
HCT: 43 % (ref 36.0–46.0)
Hemoglobin: 14.6 g/dL (ref 12.0–15.0)
Potassium: 3.8 mmol/L (ref 3.5–5.1)
Sodium: 140 mmol/L (ref 135–145)
TCO2: 27 mmol/L (ref 22–32)

## 2022-08-08 SURGERY — ABDOMINAL AORTOGRAM W/LOWER EXTREMITY
Anesthesia: LOCAL

## 2022-08-08 MED ORDER — IODIXANOL 320 MG/ML IV SOLN
INTRAVENOUS | Status: DC | PRN
  Administered 2022-08-08: 51 mL

## 2022-08-08 MED ORDER — AMLODIPINE BESYLATE 5 MG PO TABS: 2.5000 mg | ORAL_TABLET | Freq: Every day | ORAL | Status: DC

## 2022-08-08 MED ORDER — SODIUM CHLORIDE 0.9 % IV SOLN: 250.0000 mL | INTRAVENOUS | Status: DC | PRN

## 2022-08-08 MED ORDER — FENTANYL CITRATE (PF) 100 MCG/2ML IJ SOLN
INTRAMUSCULAR | Status: AC
  Filled 2022-08-08: qty 2

## 2022-08-08 MED ORDER — VITAMIN D 25 MCG (1000 UNIT) PO TABS: 1000.0000 [IU] | ORAL_TABLET | Freq: Every day | ORAL | Status: DC

## 2022-08-08 MED ORDER — LIDOCAINE HCL (PF) 1 % IJ SOLN
INTRAMUSCULAR | Status: AC
  Filled 2022-08-08: qty 30

## 2022-08-08 MED ORDER — ALPRAZOLAM 0.25 MG PO TABS
0.2500 mg | ORAL_TABLET | Freq: Every day | ORAL | Status: DC
  Administered 2022-08-08: 0.25 mg via ORAL
  Filled 2022-08-08: qty 1

## 2022-08-08 MED ORDER — SODIUM CHLORIDE 0.9 % WEIGHT BASED INFUSION: 1.0000 mL/kg/h | INTRAVENOUS | Status: AC

## 2022-08-08 MED ORDER — METOPROLOL SUCCINATE 12.5 MG HALF TABLET
12.5000 mg | ORAL_TABLET | Freq: Every day | ORAL | Status: DC
  Filled 2022-08-08: qty 1

## 2022-08-08 MED ORDER — FENTANYL CITRATE (PF) 100 MCG/2ML IJ SOLN
INTRAMUSCULAR | Status: DC | PRN
  Administered 2022-08-08: 25 ug via INTRAVENOUS

## 2022-08-08 MED ORDER — B-12 500 MCG SL SUBL: 500.0000 ug | SUBLINGUAL_TABLET | Freq: Every day | SUBLINGUAL | Status: DC

## 2022-08-08 MED ORDER — HYDRALAZINE HCL 20 MG/ML IJ SOLN: 5.0000 mg | INTRAMUSCULAR | Status: DC | PRN

## 2022-08-08 MED ORDER — GABAPENTIN 100 MG PO CAPS
100.0000 mg | ORAL_CAPSULE | Freq: Every day | ORAL | Status: DC
  Administered 2022-08-08: 100 mg via ORAL
  Filled 2022-08-08: qty 1

## 2022-08-08 MED ORDER — MIRABEGRON ER 25 MG PO TB24: 25.0000 mg | ORAL_TABLET | Freq: Every day | ORAL | Status: DC | PRN

## 2022-08-08 MED ORDER — ASPIRIN 325 MG PO TBEC: 325.0000 mg | DELAYED_RELEASE_TABLET | Freq: Every day | ORAL | Status: DC

## 2022-08-08 MED ORDER — SODIUM CHLORIDE 0.9 % IV SOLN: INTRAVENOUS | Status: DC

## 2022-08-08 MED ORDER — SODIUM CHLORIDE 0.9% FLUSH
3.0000 mL | Freq: Two times a day (BID) | INTRAVENOUS | Status: DC
  Administered 2022-08-09: 3 mL via INTRAVENOUS

## 2022-08-08 MED ORDER — LABETALOL HCL 5 MG/ML IV SOLN: 10.0000 mg | INTRAVENOUS | Status: DC | PRN

## 2022-08-08 MED ORDER — CALCIUM CARBONATE ANTACID 500 MG PO CHEW
1.0000 | CHEWABLE_TABLET | Freq: Every day | ORAL | Status: DC
  Administered 2022-08-08: 200 mg via ORAL
  Filled 2022-08-08: qty 1

## 2022-08-08 MED ORDER — ACETAMINOPHEN 325 MG PO TABS: 650.0000 mg | ORAL_TABLET | ORAL | Status: DC | PRN

## 2022-08-08 MED ORDER — ONDANSETRON HCL 4 MG/2ML IJ SOLN: 4.0000 mg | Freq: Four times a day (QID) | INTRAMUSCULAR | Status: DC | PRN

## 2022-08-08 MED ORDER — CO Q-10 200 MG PO CAPS: 1.0000 | ORAL_CAPSULE | Freq: Every day | ORAL | Status: DC

## 2022-08-08 MED ORDER — LIDOCAINE HCL (PF) 1 % IJ SOLN
INTRAMUSCULAR | Status: DC | PRN
  Administered 2022-08-08: 10 mL

## 2022-08-08 MED ORDER — SODIUM CHLORIDE 0.9% FLUSH: 3.0000 mL | INTRAVENOUS | Status: DC | PRN

## 2022-08-08 MED ORDER — EZETIMIBE 10 MG PO TABS: 10.0000 mg | ORAL_TABLET | Freq: Every day | ORAL | Status: DC

## 2022-08-08 MED ORDER — IBUPROFEN 200 MG PO TABS: 200.0000 mg | ORAL_TABLET | Freq: Four times a day (QID) | ORAL | Status: DC | PRN

## 2022-08-08 MED ORDER — DOXYCYCLINE HYCLATE 100 MG PO TABS
100.0000 mg | ORAL_TABLET | Freq: Two times a day (BID) | ORAL | Status: DC
  Administered 2022-08-08: 100 mg via ORAL
  Filled 2022-08-08: qty 1

## 2022-08-08 MED ORDER — SODIUM FLUORIDE 1.1 % DT PSTE: 1.0000 | PASTE | Freq: Two times a day (BID) | DENTAL | Status: DC

## 2022-08-08 MED ORDER — RALOXIFENE HCL 60 MG PO TABS
60.0000 mg | ORAL_TABLET | Freq: Every day | ORAL | Status: DC
  Filled 2022-08-08: qty 1

## 2022-08-08 MED ORDER — VITAMIN C 500 MG PO TABS: 1000.0000 mg | ORAL_TABLET | Freq: Every day | ORAL | Status: DC

## 2022-08-08 SURGICAL SUPPLY — 13 items
CATH OMNI FLUSH 5F 65CM (CATHETERS) IMPLANT
DEVICE VASC CLSR CELT ART 5 (Vascular Products) IMPLANT
GUIDEWIRE ANGLED .035X150CM (WIRE) IMPLANT
KIT MICROPUNCTURE NIT STIFF (SHEATH) IMPLANT
KIT PV (KITS) ×1 IMPLANT
SHEATH PINNACLE 5F 10CM (SHEATH) IMPLANT
SHEATH PROBE COVER 6X72 (BAG) IMPLANT
STOPCOCK MORSE 400PSI 3WAY (MISCELLANEOUS) IMPLANT
SYR MEDRAD MARK 7 150ML (SYRINGE) ×1 IMPLANT
TRANSDUCER W/STOPCOCK (MISCELLANEOUS) ×1 IMPLANT
TRAY PV CATH (CUSTOM PROCEDURE TRAY) ×1 IMPLANT
TUBING CIL FLEX 10 FLL-RA (TUBING) IMPLANT
WIRE HITORQ VERSACORE ST 145CM (WIRE) IMPLANT

## 2022-08-08 NOTE — Interval H&P Note (Signed)
History and Physical Interval Note:  08/08/2022 8:38 AM  [name]  has presented today for surgery, with the diagnosis of pad w/left toe ulcer.  The various methods of treatment have been discussed with the patient and family. After consideration of risks, benefits and other options for treatment, the patient has consented to  Procedure(s): ABDOMINAL AORTOGRAM W/LOWER EXTREMITY (N/A) as a surgical intervention.  The patient's history has been reviewed, patient examined, no change in status, stable for surgery.  I have reviewed the patient's chart and labs.  Questions were answered to the patient's satisfaction.     Annamarie Major

## 2022-08-08 NOTE — Op Note (Signed)
    Patient name: [name] MRN: [id number] DOB: March 31, 1929 Sex: female  08/08/2022 Pre-operative Diagnosis: Left toe ulcer Post-operative diagnosis:  Same Surgeon:  Annamarie Major Procedure Performed:  1.  Ultrasound-guided access, right femoral artery  2.  Abdominal aortogram  3.  Second-order catheterization  4.  Left lower extremity runoff  5.  Closure device, Celt    Indications: This is a 89 year old female with chronic left second toe wound who comes in today for angiography  Procedure:  The patient was identified in the holding area and taken to room 8.  The patient was then placed supine on the table and prepped and draped in the usual sterile fashion.  A time out was called.  She was given 25 mcg of fentanyl.  Ultrasound was used to evaluate the right common femoral artery.  It was patent .  A digital ultrasound image was acquired.  A micropuncture needle was used to access the right common femoral artery under ultrasound guidance.  An 018 wire was advanced without resistance and a micropuncture sheath was placed.  The 018 wire was removed and a benson wire was placed.  The micropuncture sheath was exchanged for a 5 french sheath.  An omniflush catheter was advanced over the wire to the level of L-1.  An abdominal angiogram was obtained.  Next, using the omniflush catheter and a benson wire, the aortic bifurcation was crossed and the catheter was placed into theleft external iliac artery and left runoff was obtained.    Findings:   Aortogram: No significant renal artery stenosis was identified.  The infrarenal abdominal aorta is small in caliber but patent without stenosis.  Bilateral common and external iliac arteries are widely patent.  Right Lower Extremity: Not evaluated  Left Lower Extremity: The left common femoral and profundofemoral artery are widely patent.  The left popliteal and superficial femoral artery are widely patent.  There is three-vessel runoff to the ankle.  The  dominant vessel across the ankle is the posterior tibial artery.  Her tibial vessels are very small in caliber without stenosis.  There is microvascular disease out onto the foot  Intervention:  none  Impression:  #1  No significant aortoiliac occlusive disease  #2  No significant outflow disease  #3  Three-vessel runoff to the ankle with the posterior tibial being the dominant vessel across the ankle.  Her vessels are small in caliber.  Diffuse disease out on to the foot.  Blood flow to the left foot is optimized    V. Annamarie Major, M.D., [facility] [place] Office: [phone] Pager:  [phone]

## 2022-08-09 ENCOUNTER — Encounter (HOSPITAL_COMMUNITY): Payer: Self-pay | Admitting: Surgery

## 2022-08-09 DIAGNOSIS — L89899 Pressure ulcer of other site, unspecified stage: Secondary | ICD-10-CM | POA: Diagnosis not present

## 2022-08-09 LAB — LIPID PANEL
Cholesterol: 220 mg/dL — ABNORMAL HIGH (ref 0–200)
HDL: 56 mg/dL (ref >40)
LDL Cholesterol: 134 mg/dL — ABNORMAL HIGH (ref 0–99)
Total CHOL/HDL Ratio: 3.9 RATIO
Triglycerides: 148 mg/dL (ref <150)
VLDL: 30 mg/dL (ref 0–40)

## 2022-08-09 NOTE — Progress Notes (Signed)
[facility] Specialists of [place]  Subjective  - doing OK over all   Objective 127/64 93 98 F (36.7 C) (Oral) 18 95%  Intake/Output Summary (Last 24 hours) at 08/09/2022 0832 Last data filed at 08/09/2022 0143 Gross per 24 hour  Intake 489.44 ml  Output --  Net 489.44 ml   Left toe open wound Right groin soft without hematoma Lungs non labored breathing    Assessment/Planning: This is a 89 year old female with chronic left second toe wound  POD # diagnostic angiogram   Impression:             #1  No significant aortoiliac occlusive disease             #2  No significant outflow disease             #3  Three-vessel runoff to the ankle with the posterior tibial being the dominant vessel across the ankle.  Her vessels are small in caliber.  Diffuse disease out on to the foot.  Blood flow to the left foot is optimized  She will f/u with [facility] PRN.  Continue foot/toe wound care with DPM.   Encouraged staying active as tolerates.  Roxy Horseman 08/09/2022 8:32 AM --  Laboratory Lab Results: Recent Labs    08/08/22 0934  HGB 14.6  HCT 43.0   BMET Recent Labs    08/08/22 0934  NA 140  K 3.8  CL 104  GLUCOSE 116*  BUN 16  CREATININE 0.90    COAG Lab Results  Component Value Date   INR 1.07 12/26/2017   INR 1.19 12/19/2017   No results found for: "PTT"

## 2022-08-09 NOTE — Progress Notes (Signed)
Discharge instructions given. Patient verbalized understanding and all questions were answered. Patient refused morning meds and will take it wants she gets home.

## 2022-08-09 NOTE — Progress Notes (Signed)
    Subjective  - POD #1, status post left lower extremity angiogram  No complaints this morning   Physical Exam:  Right groin cannulation site is soft without hematoma Extremities warm well-perfused      Assessment/Plan:  POD #1  Patient is stable for discharge She will follow-up with podiatry for continuing wound care Follow-up with me as needed  Wells Quintin Hjort 08/09/2022 8:36 AM --  Vitals:   08/09/22 0540 08/09/22 0747  BP:  127/64  Pulse: 83 93  Resp: 13 18  Temp: 98.6 F (37 C) 98 F (36.7 C)  SpO2: 95%     Intake/Output Summary (Last 24 hours) at 08/09/2022 0836 Last data filed at 08/09/2022 0143 Gross per 24 hour  Intake 489.44 ml  Output --  Net 489.44 ml     Laboratory CBC    Component Value Date/Time   WBC CANCELED 08/26/2021 1053   WBC 13.8 (H) 07/12/2021 0535   HGB 14.6 08/08/2022 0934   HGB CANCELED 08/26/2021 1053   HCT 43.0 08/08/2022 0934   HCT CANCELED 08/26/2021 1053   PLT CANCELED 08/26/2021 1053    BMET    Component Value Date/Time   NA 140 08/08/2022 0934   NA 134 08/26/2021 1053   K 3.8 08/08/2022 0934   CL 104 08/08/2022 0934   CO2 23 08/26/2021 1053   GLUCOSE 116 (H) 08/08/2022 0934   BUN 16 08/08/2022 0934   BUN 20 08/26/2021 1053   CREATININE 0.90 08/08/2022 0934   CREATININE 0.88 04/10/2016 0953   CALCIUM 9.5 08/26/2021 1053   GFRNONAA 60 (L) 07/12/2021 0535   GFRAA >60 12/06/2018 0303    COAG Lab Results  Component Value Date   INR 1.07 12/26/2017   INR 1.19 12/19/2017   No results found for: "PTT"  Antibiotics Anti-infectives (From admission, onward)    Start     Dose/Rate Route Frequency Ordered Stop   08/08/22 2200  doxycycline (VIBRA-TABS) tablet 100 mg        100 mg Oral 2 times daily 08/08/22 1247          V. Leia Alf, M.D., [facility] [place] Office: [phone] Pager:  [phone]

## 2022-08-09 NOTE — Plan of Care (Signed)
  Problem: Activity: Goal: Ability to return to baseline activity level will improve Outcome: Progressing   Problem: Cardiovascular: Goal: Vascular access site(s) Level 0-1 will be maintained Outcome: Progressing   Problem: Health Behavior/Discharge Planning: Goal: Ability to safely manage health-related needs after discharge will improve Outcome: Progressing

## 2022-08-11 ENCOUNTER — Ambulatory Visit: Payer: Medicare Other | Admitting: Podiatry

## 2022-08-11 ENCOUNTER — Ambulatory Visit (INDEPENDENT_AMBULATORY_CARE_PROVIDER_SITE_OTHER): Payer: Medicare Other

## 2022-08-11 DIAGNOSIS — L97501 Non-pressure chronic ulcer of other part of unspecified foot limited to breakdown of skin: Secondary | ICD-10-CM | POA: Diagnosis not present

## 2022-08-11 DIAGNOSIS — M86172 Other acute osteomyelitis, left ankle and foot: Secondary | ICD-10-CM

## 2022-08-11 NOTE — Patient Instructions (Signed)
Monitor for any signs/symptoms of infection. Call the office immediately if any occur or go directly to the emergency room. Call with any questions/concerns.  --   Peripheral Vascular Disease  Peripheral vascular disease (PVD) is a disease of the blood vessels. PVD may also be called peripheral artery disease (PAD) or poor circulation. PVD is the blocking or hardening of the arteries anywhere within the circulatory system beyond the heart. This can result in a decreased supply of blood to the arms, legs, and internal organs, such as the stomach or kidneys. However, PVD most often affects a person's lower legs and feet. Without treatment, PVD often worsens. PVD can lead to acute limb ischemia. This occurs when an arm or leg suddenly has trouble getting enough blood. This is a medical emergency. What are the causes? The most common cause of PVD is atherosclerosis. This is a buildup of fatty material and other substances (plaque)inside your arteries. Pieces of plaque can break off from the walls of an artery and become stuck in a smaller artery, blocking blood flow and possibly causing acute limb ischemia. Other common causes of PVD include: Blood clots that form inside the blood vessels. Injuries to blood vessels. Diseases that cause inflammation of blood vessels or cause blood vessel tightening (spasms). What increases the risk? The following factors may make you more likely to develop this condition: A family history of PVD. Common medical conditions, including: High cholesterol. Diabetes. High blood pressure (hypertension). Heart disease. Known atherosclerotic disease in another area of the body. Past injury, such as burns or a broken bone. Other medical conditions, such as: Buerger's disease. This is caused by inflamed blood vessels in your hands and feet. Some forms of arthritis. Birth defects that affect the arteries in your legs. Kidney disease. Using tobacco and nicotine  products. Not getting enough exercise. Obesity. Being age 87 or older, or being age 87 or older and having the other risk factors. What are the signs or symptoms? This condition may cause different symptoms. Your symptoms depend on what body part is not getting enough blood. Common signs and symptoms include: Cramps in your buttocks, legs, and feet. Intermittent claudication. This is pain and weakness in your legs during activity that resolves with rest. Leg pain at rest and leg numbness, tingling, or weakness. Coldness in a leg or foot, especially when compared to the other leg or foot. Skin or hair changes. These can include: Hair loss. Shiny skin. Pale or bluish skin. Thick toenails. Inability to get or maintain an erection (erectile dysfunction). Tiredness (fatigue). Weak pulse or no pulse in the feet. People with PVD are more likely to develop open wounds (ulcers) and sores on their toes, feet, or legs. The ulcers or sores may take longer than normal to heal. How is this diagnosed? PVD is diagnosed based on your signs and symptoms, a physical exam, and your medical history. You may also have other tests to find the cause. Tests include: Ankle-brachial index test.This test compares the blood pressure readings of the legs and arms. This may also include an exercise ankle-brachial index test in which you walk on a treadmill to check your symptoms. Doppler ultrasound. This takes pictures of blood flow through your blood vessels. Imaging studies that use dye to show blood flow. These are: CT angiogram. Magnetic resonance angiogram, or MRA. How is this treated? Treatment for PVD depends on the cause of your condition, how severe your symptoms are, and your age. Underlying causes need to be treated  and controlled. These include long-term (chronic) conditions, such as diabetes, high cholesterol, and hypertension. Treatment may include: Lifestyle changes, such as: Quitting tobacco  use. Exercising regularly. Following a low-fat, low-cholesterol diet. Not drinking alcohol. Taking medicines, such as: Blood thinners to prevent blood clots. Medicines to improve blood flow. Medicines to improve cholesterol levels. Procedures, such as: Angioplasty. This uses an inflated balloon to open a blocked artery and improve blood flow. Stent implant. This inserts a small mesh tube to keep a blocked artery open. Peripheral bypass surgery. This reroutes blood flow around a blocked artery. Surgery to remove dead tissue from an infected wound (debridement). Amputation. This is surgical removal of the affected limb. It may be necessary in cases of acute limb ischemia when medical or surgical treatments have not helped. Follow these instructions at home: Medicines Take over-the-counter and prescription medicines only as told by your health care provider. If you are taking blood thinners: Talk with your health care provider before you take any medicines that contain aspirin or NSAIDs, such as ibuprofen. These medicines increase your risk for dangerous bleeding. Take your medicine exactly as told, at the same time every day. Avoid activities that could cause injury or bruising, and follow instructions about how to prevent falls. Wear a medical alert bracelet or carry a card that lists what medicines you take. Lifestyle     Exercise regularly. Ask your health care provider about some good activities for you. Talk with your health care provider about maintaining a healthy weight. If needed, ask about losing weight. Eat a diet that is low in fat and cholesterol. If you need help, talk with your health care provider. Do not drink alcohol. Do not use any products that contain nicotine or tobacco. These products include cigarettes, chewing tobacco, and vaping devices, such as e-cigarettes. If you need help quitting, ask your health care provider. General instructions Take good care of your  feet. To do this: Wear comfortable shoes that fit well. Check your feet often for any cuts or sores. Get an annual influenza vaccine. Keep all follow-up visits. This is important. Where to find more information Society for Vascular Surgery: vascular.org American Heart Association: heart.org National Heart, Lung, and Blood Institute: https://www.hartman-hill.biz/ Contact a health care provider if: You have leg cramps while walking. You have leg pain when you rest. Your leg or foot feels cold. Your skin changes color. You have erectile dysfunction. You have cuts or sores on your legs or feet that do not heal. Get help right away if: You have sudden changes in color and feeling of your arms or legs, such as: Your arm or leg turns cold, numb, and blue. Your arm or leg becomes red, warm, swollen, painful, or numb. You have any symptoms of a stroke. "BE FAST" is an easy way to remember the main warning signs of a stroke: B - Balance. Signs are dizziness, sudden trouble walking, or loss of balance. E - Eyes. Signs are trouble seeing or a sudden change in vision. F - Face. Signs are sudden weakness or numbness of the face, or the face or eyelid drooping on one side. A - Arms. Signs are weakness or numbness in an arm. This happens suddenly and usually on one side of the body. S - Speech. Signs are sudden trouble speaking, slurred speech, or trouble understanding what people say. T - Time. Time to call emergency services. Write down what time symptoms started. You have other signs of a stroke, such as: A sudden,  severe headache with no known cause. Nausea or vomiting. Seizure. You have chest pain or trouble breathing. These symptoms may represent a serious problem that is an emergency. Do not wait to see if the symptoms will go away. Get medical help right away. Call your local emergency services (911 in the [place] Do not drive yourself to the hospital. Summary Peripheral vascular disease (PVD) is a disease  of the blood vessels. PVD is the blocking or hardening of the arteries anywhere within the circulatory system beyond the heart. PVD may cause different symptoms. Your symptoms depend on what part of your body is not getting enough blood. Treatment for PVD depends on what caused it, how severe your symptoms are, and your age. This information is not intended to replace advice given to you by your health care provider. Make sure you discuss any questions you have with your health care provider. Document Revised: 11/17/2019 Document Reviewed: 11/17/2019 Elsevier Patient Education  [place].

## 2022-08-13 NOTE — Discharge Summary (Signed)
[facility] Discharge Summary   Patient ID:  [name] MRN: [id number] DOB/AGE: 10/20/1928 89 y.o.  Admit date: 08/08/2022 Discharge date: 08/09/2022 Date of Surgery: 08/08/2022 Surgeon: Surgeon(s): Serafina Mitchell, MD  Admission Diagnosis: PAD (peripheral artery disease) [facility]) [I73.9]  Discharge Diagnoses:  PAD (peripheral artery disease) ([place]) [I73.9]  Secondary Diagnoses: Past Medical History:  Diagnosis Date   Atherosclerosis of aorta ([place])    Breast cancer ([place]) 1986   left ,s/p radiation   Diverticulosis    GERD (gastroesophageal reflux disease)    Hiatal hernia    Hypercholesteremia    Hypertension    Osteopenia    Osteoporosis     Procedure(s): ABDOMINAL AORTOGRAM W/LOWER EXTREMITY  Discharged Condition: good  HPI: [name] is a 89 y.o. female who I met in February 2024 for evaluation of a left second toe wound.  She is followed by Dr. Vista Deck.  She apparently has developed a hammertoe and since at least September, 2023, she has had a wound. She did have a palpable posterior tibial pulse however her toe pressures were slightly diminished.   She was offered an angiogram and possible intervention to improve her inflow to heal the wound.    Hospital Course:  [name] is a 89 y.o. female is S/P Left LE runoff Procedure(s): ABDOMINAL AORTOGRAM W/LOWER EXTREMITY Accessed right groin without hematoma healing well This was diagnostic with findings of: Left Lower Extremity: The left common femoral and profundofemoral artery are widely patent.  The left popliteal and superficial femoral artery are widely patent.  There is three-vessel runoff to the ankle.  The dominant vessel across the ankle is the posterior tibial artery.  Her tibial vessels are very small in caliber without stenosis.  There is microvascular disease out onto the foot.  She is optimized from a vascular point of view.  F/U as needed.     Significant Diagnostic  Studies: CBC Lab Results  Component Value Date   WBC CANCELED 08/26/2021   HGB 14.6 08/08/2022   HCT 43.0 08/08/2022   MCV 90.6 07/12/2021   PLT CANCELED 08/26/2021    BMET    Component Value Date/Time   NA 140 08/08/2022 0934   NA 134 08/26/2021 1053   K 3.8 08/08/2022 0934   CL 104 08/08/2022 0934   CO2 23 08/26/2021 1053   GLUCOSE 116 (H) 08/08/2022 0934   BUN 16 08/08/2022 0934   BUN 20 08/26/2021 1053   CREATININE 0.90 08/08/2022 0934   CREATININE 0.88 04/10/2016 0953   CALCIUM 9.5 08/26/2021 1053   GFRNONAA 60 (L) 07/12/2021 0535   GFRAA >60 12/06/2018 0303   COAG Lab Results  Component Value Date   INR 1.07 12/26/2017   INR 1.19 12/19/2017     Disposition:  Discharge to :Home Discharge Instructions     Call MD for:  redness, tenderness, or signs of infection (pain, swelling, bleeding, redness, odor or green/yellow discharge around incision site)   Complete by: As directed    Call MD for:  severe or increased pain, loss or decreased feeling  in affected limb(s)   Complete by: As directed    Call MD for:  temperature >100.5   Complete by: As directed    Resume previous diet   Complete by: As directed       Allergies as of 08/09/2022       Reactions   Cellcept [mycophenolate] Other (See Comments)   dizziness, worsening weakness, depressed, just "did  not feel right".   Covid-19 (mrna Bivalent) Vaccine Therapist, music) [covid-19 (mrna) Vaccine]    Partial paralysis         Medication List     TAKE these medications    ALPRAZolam 0.25 MG tablet Commonly known as: XANAX Take 0.25 mg by mouth at bedtime.   amLODipine 5 MG tablet Commonly known as: NORVASC Take 0.5 tablets (2.5 mg total) by mouth daily. What changed: how much to take   aspirin EC 325 MG tablet Take 325 mg by mouth daily.   B-12 500 MCG Subl Place 500 mcg under the tongue daily.   calcium carbonate 750 MG chewable tablet Commonly known as: TUMS EX Chew 1 tablet by mouth at  bedtime.   Co Q-10 200 MG Caps Take 1 capsule by mouth daily.   diclofenac Sodium 1 % Gel Commonly known as: VOLTAREN Apply 2 g topically daily as needed. What changed:  when to take this reasons to take this   doxycycline 100 MG tablet Commonly known as: VIBRA-TABS Take 1 tablet (100 mg total) by mouth 2 (two) times daily.   ezetimibe 10 MG tablet Commonly known as: ZETIA Take 10 mg by mouth daily.   gabapentin 100 MG capsule Commonly known as: NEURONTIN Take 1 capsule (100 mg total) by mouth at bedtime.   GLUCOSAMINE CHONDROITIN ADV PO Take 1 tablet by mouth daily.   ibuprofen 200 MG tablet Commonly known as: ADVIL Take 200 mg by mouth every 6 (six) hours as needed for moderate pain.   ICAPS AREDS 2 PO Take 1 capsule by mouth daily.   metoprolol succinate 25 MG 24 hr tablet Commonly known as: TOPROL-XL TAKE 1/2 TABLET BY MOUTH DAILY   multivitamin tablet Take 1 tablet by mouth daily.   mupirocin ointment 2 % Commonly known as: BACTROBAN Apply 1 application. topically 2 (two) times daily. What changed:  when to take this reasons to take this   Myrbetriq 50 MG Tb24 tablet Generic drug: mirabegron ER Take 25 mg by mouth daily as needed (bladder control).   neomycin-bacitracin-polymyxin Oint Commonly known as: NEOSPORIN Apply 1 Application topically as needed for wound care.   omeprazole 20 MG capsule Commonly known as: PRILOSEC Take 1 capsule (20 mg total) by mouth daily.   [facility] Colon Health Caps Take 1 capsule by mouth daily.   PreviDent 5000 Booster Plus 1.1 % Pste Generic drug: Sodium Fluoride Place 1 Application onto teeth 2 (two) times daily.   raloxifene 60 MG tablet Commonly known as: EVISTA TAKE 1 TABLET BY MOUTH ONCE DAILY   vitamin C 1000 MG tablet Take 1,000 mg by mouth daily.   Vitamin D 1000 units capsule Take 1,000 Units by mouth daily.       Verbal and written Discharge instructions given to the patient. Wound care per  Discharge AVS  Follow-up Information     Serafina Mitchell, MD Follow up.   Specialties: Vascular Surgery, Cardiology Why: As needed Contact information: [place] [address] [phone]                 Signed: Roxy Horseman 08/13/2022, 10:20 AM

## 2022-08-17 ENCOUNTER — Other Ambulatory Visit: Payer: Self-pay | Admitting: Podiatry

## 2022-08-17 ENCOUNTER — Telehealth: Payer: Self-pay

## 2022-08-17 MED ORDER — CEPHALEXIN 500 MG PO CAPS: 500.0000 mg | ORAL_CAPSULE | Freq: Three times a day (TID) | ORAL | 0 refills | Status: DC

## 2022-08-18 NOTE — Telephone Encounter (Signed)
Patient is aware 

## 2022-08-20 NOTE — Progress Notes (Signed)
Subjective: Chief Complaint  Patient presents with   Follow-up    Left foot, ulcer on the 2nd digit, patient feels ulcer is trying to heal    89 year old female presents the office today for the above concerns.  She presents for evaluation of a wound on the left second toe.  She is underwent angio with vascular surgery.  She thinks the wound of the toe will do better.  Is difficult for her to keep a bandage on the toe herself.  She does not report any increasing drainage or pus.  No recent swelling or redness.  No fevers or chills.  No other concerns.   Objective: AAO x3, NAD DP/PT pulses palpable bilaterally, CRT less than 3 seconds On the dorsal aspect of the right second digit is a full-thickness granular wound with localized edema and erythema present of the toe.  There is still 1 small area of exposed bone.  There is no ascending cellulitis.  There is no fluctuation or crepitation.  No malodor. No pain with calf compression, swelling, warmth, erythema  Assessment: Hammertoe contracture resulted in ulceration 2nd toe  Plan: -All treatment options discussed with the patient including all alternatives, risks, complications.  -X-rays were obtained reviewed.  3 views of the foot were obtained.  Lucency noted along the proximal phalanx head the other hammertoe correspondent.  Ulceration concerning for likely osteomyelitis. -We discussed surgical options including arthroplasty versus amputation of the toe which she like to try to avoid surgery and save the toe possible.  Refer to the wound care center to see if there is any other options to help facilitate wound healing.  Suspect difficult for her to keep the dressing on the toe.  She has been on antibiotics for quite some time we will see how she does off of them as the toe is stable.  -Monitor for any clinical signs or symptoms of infection and directed to call the office immediately should any occur or go to the ER.  Return in about 2 weeks  (around 08/25/2022).  Trula Slade DPM

## 2022-08-24 ENCOUNTER — Ambulatory Visit: Payer: Medicare Other | Admitting: Podiatry

## 2022-08-24 DIAGNOSIS — L97524 Non-pressure chronic ulcer of other part of left foot with necrosis of bone: Secondary | ICD-10-CM | POA: Diagnosis not present

## 2022-08-28 NOTE — Progress Notes (Signed)
Subjective: Chief Complaint  Patient presents with   Follow-up    Left foot, 2nd digit, patient stated that the antibiotics are helping with the healing process but slowly     89 year old female presents the office today for the above concerns.  She is of antibiotics seem to be working although slow.  She does have a ointment scheduled for the wound care center but not for couple more weeks.  She states that she is still hoping to save the toe.  Denies any fevers or chills.   Objective: AAO x3, NAD DP/PT pulses palpable bilaterally, CRT less than 3 seconds On the dorsal aspect of the right second digit is a full-thickness granular wound with localized edema and erythema present of the toe.  There is still 1 small area of exposed bone.  This is not worsened.  The wound base is 100% granular.  There are some local edema erythema present.  There is no fluctuance or crepitation there is no malodor.  Rigid hammertoe present.  No pain with calf compression, swelling, warmth, erythema  Assessment: Hammertoe contracture resulted in ulceration 2nd toe  Plan: -All treatment options discussed with the patient including all alternatives, risks, complications.  -She is the current course of antibiotics and will observe off of antibiotics which she has been on and off for quite some time. -We discussed surgical options including arthroplasty versus amputation of the toe which she like to try to avoid surgery and save the toe possible.  Already referral to the wound care center to see if there is any other options to help facilitate wound healing.  Suspect difficult for her to keep the dressing on the toe.  Today applied Maxorb and then she can keep toe over top of this. -Monitor for any clinical signs or symptoms of infection and directed to call the office immediately should any occur or go to the ER.  Return in about 2 weeks (around 09/07/2022).  Trula Slade DPM

## 2022-09-07 ENCOUNTER — Ambulatory Visit: Payer: Medicare Other | Admitting: Podiatry

## 2022-09-07 VITALS — BP 126/64 | HR 93 | Temp 96.8°F

## 2022-09-07 DIAGNOSIS — L97524 Non-pressure chronic ulcer of other part of left foot with necrosis of bone: Secondary | ICD-10-CM | POA: Diagnosis not present

## 2022-09-07 NOTE — Progress Notes (Signed)
Subjective: Chief Complaint  Patient presents with   Foot Ulcer    Ulcer on left 2nd toe. No drainage noted. Patient states that the toe and foot has been swollen. The toe has some redness. No additional concerns for the provider.      89 year old female presents the office today for the above concerns.  She states the wound is getting better and she is not having as much pain.  She is scheduled to see the wound care center next week.  Denies any fevers or chills.  No other concerns.    Objective: AAO x3, NAD DP/PT pulses palpable bilaterally, CRT less than 3 seconds On the dorsal aspect of the right second digit is a full-thickness granular wound with localized edema and erythema present of the toe appears to be localized.  The wound she is feeling then.  There is still the same 1 small area with exposed bone but is not worsening.  There is no fluctuance or crepitation.  No malodor. No pain with calf compression, swelling, warmth, erythema  Assessment: Hammertoe contracture resulted in ulceration 2nd toe  Plan: -All treatment options discussed with the patient including all alternatives, risks, complications.  -We can discuss surgical versus conservative treatments.  She is to call the wound care center next week.  Although she has 1 small area of exposed bone the wound is healing despite antibiotics.  She has been on multiple rounds of antibiotics is having GI issues.  Observe off of antibiotics.  However low threshold for restarting them as needed.  Unfortunately still at risk of amputation.  Vivi Barrack DPM

## 2022-09-14 ENCOUNTER — Ambulatory Visit (HOSPITAL_BASED_OUTPATIENT_CLINIC_OR_DEPARTMENT_OTHER): Payer: Medicare Other | Admitting: Internal Medicine

## 2022-09-14 ENCOUNTER — Encounter (HOSPITAL_BASED_OUTPATIENT_CLINIC_OR_DEPARTMENT_OTHER): Payer: Medicare Other | Attending: Internal Medicine | Admitting: Internal Medicine

## 2022-09-14 DIAGNOSIS — I70245 Atherosclerosis of native arteries of left leg with ulceration of other part of foot: Secondary | ICD-10-CM | POA: Insufficient documentation

## 2022-09-14 DIAGNOSIS — E11621 Type 2 diabetes mellitus with foot ulcer: Secondary | ICD-10-CM | POA: Diagnosis not present

## 2022-09-14 DIAGNOSIS — M86672 Other chronic osteomyelitis, left ankle and foot: Secondary | ICD-10-CM | POA: Insufficient documentation

## 2022-09-14 DIAGNOSIS — L97524 Non-pressure chronic ulcer of other part of left foot with necrosis of bone: Secondary | ICD-10-CM | POA: Diagnosis not present

## 2022-09-14 DIAGNOSIS — E1169 Type 2 diabetes mellitus with other specified complication: Secondary | ICD-10-CM | POA: Insufficient documentation

## 2022-09-14 DIAGNOSIS — I10 Essential (primary) hypertension: Secondary | ICD-10-CM | POA: Diagnosis not present

## 2022-09-14 NOTE — Progress Notes (Signed)
Call, [name] ([id number]) [id number] Page 1 of 4 Visit Report for 09/14/2022 Abuse Risk Screen Details Patient Name: Date of Service: [name], [name]. 09/14/2022 9:30 [name] M Medical Record Number: [id number] Patient Account Number: [id number] Date of Birth/Sex: Treating RN: 04-10-1929 (89 y.o. [name], [name] Primary Care Advait Buice: Ezequiel Kayser Other Clinician: Referring Pranathi Winfree: Treating Haelie Clapp/Extender: Jenene Slicker in Treatment: 0 Abuse Risk Screen Items Answer ABUSE RISK SCREEN: Has anyone close to you tried to hurt or harm you recentlyo No Do you feel uncomfortable with anyone in your familyo No Has anyone forced you do things that you didnt want to doo No Electronic Signature(s) Signed: 09/14/2022 2:41:21 PM By: Shawn Stall RN, BSN Entered By: Shawn Stall on 09/14/2022 09:28:19 -------------------------------------------------------------------------------- Activities of Daily Living Details Patient Name: Date of Service: [name], [name]. 09/14/2022 9:30 [name] M Medical Record Number: [id number] Patient Account Number: [id number] Date of Birth/Sex: Treating RN: 10/11/1928 (89 y.o. [name], [name] Primary Care Adarsh Mundorf: Ezequiel Kayser Other Clinician: Referring Kent Riendeau: Treating Kelita Wallis/Extender: Jenene Slicker in Treatment: 0 Activities of Daily Living Items Answer Activities of Daily Living (Please select one for each item) Drive Automobile Completely Able T Medications ake Completely Able Use T elephone Completely Able Care for Appearance Completely Able Use T oilet Completely Able Bath / Shower Completely Able Dress Self Completely Able Feed Self Need Assistance Walk Need Assistance Get In / Out Bed Completely Able Housework Completely Able Prepare Meals Completely Able Handle Money Completely Able Shop for Self Completely Able Electronic Signature(s) Signed: 09/14/2022  2:41:21 PM By: Shawn Stall RN, BSN Entered By: Shawn Stall on 09/14/2022 09:28:46 -------------------------------------------------------------------------------- Education Screening Details Patient Name: Date of Service: [name]. 09/14/2022 9:30 [name] M Medical Record Number: [id number] Patient Account Number: [id number] Date of Birth/Sex: Treating RN: [birth year] (89 y.o. [name] Primary Care Elizabet Schweppe: Ezequiel Kayser Other Clinician: Referring Keil Pickering: Treating Jeannifer Drakeford/Extender: Jenene Slicker in Treatment: 0 [name], [name] ([id number]) [phone].pdf Page 2 of 4 Primary Learner Assessed: Patient Learning Preferences/Education Level/Primary Language Learning Preference: Explanation, Demonstration, Printed Material Highest Education Level: College or Above Preferred Language: Economist Language Barrier: No Translator Needed: No Memory Deficit: No Emotional Barrier: No Cultural/Religious Beliefs Affecting Medical Care: No Physical Barrier Impaired Vision: No Impaired Hearing: Yes HOH Decreased Hand dexterity: No Knowledge/Comprehension Knowledge Level: High Comprehension Level: High Ability to understand written instructions: High Ability to understand verbal instructions: High Motivation Anxiety Level: Calm Cooperation: Cooperative Education Importance: Acknowledges Need Interest in Health Problems: Asks Questions Perception: Coherent Willingness to Engage in Self-Management High Activities: Readiness to Engage in Self-Management High Activities: Electronic Signature(s) Signed: 09/14/2022 2:41:21 PM By: Shawn Stall RN, BSN Entered By: Shawn Stall on 09/14/2022 09:29:21 -------------------------------------------------------------------------------- Fall Risk Assessment Details Patient Name: Date of Service: [name]. 09/14/2022 9:30 [name] M Medical Record Number:  [id number] Patient Account Number: [id number] Date of Birth/Sex: Treating RN: July 26, 1928 (89 y.o. [name], [name] Primary Care Kamika Goodloe: Ezequiel Kayser Other Clinician: Referring Rodneisha Bonnet: Treating Sammie Denner/Extender: Jenene Slicker in Treatment: 0 Fall Risk Assessment Items Have you had 2 or more falls in the last 12 monthso 0 No Have you had any fall that resulted in injury in the last 12 monthso 0 No FALLS RISK SCREEN History of falling - immediate or within 3 months 0 No Secondary diagnosis (Do you have 2 or more medical diagnoseso) 0 No Ambulatory aid None/bed rest/wheelchair/nurse 0 No Crutches/cane/walker  15 Yes Furniture 0 No Intravenous therapy Access/Saline/Heparin Lock 0 No Gait/Transferring Normal/ bed rest/ wheelchair 0 No Weak (short steps with or without shuffle, stooped but able to lift head while walking, may seek 10 Yes support from furniture) Impaired (short steps with shuffle, may have difficulty arising from chair, head down, impaired 0 No balance) Mental Status Oriented to own ability 0 Yes Overestimates or forgets limitations 0 No Risk Level: Medium Risk Score: 25 [name] ([id number]) [id number] Page 3 of 4 Electronic Signature(s) -------------------------------------------------------------------------------- Foot Assessment Details Patient Name: Date of Service: [name], [name]. 09/14/2022 9:30 [name] M Medical Record Number: [id number] Patient Account Number: [id number] Date of Birth/Sex: Treating RN: 06/22/1928 (89 y.o. [name] Primary Care Constancia Geeting: Ezequiel Kayser Other Clinician: Referring Dejean Tribby: Treating Jhoanna Heyde/Extender: Jenene Slicker in Treatment: 0 Foot Assessment Items Site Locations + = Sensation present, - = Sensation absent, C = Callus, U = Ulcer R = Redness, W = Warmth, M = Maceration, PU = Pre-ulcerative lesion F = Fissure, S = Swelling, D =  Dryness Assessment Right: Left: Other Deformity: No No Prior Foot Ulcer: No No Prior Amputation: No No Charcot Joint: No No Ambulatory Status: Ambulatory With Help Assistance Device: Cane Gait: Steady Electronic Signature(s) Signed: 09/14/2022 2:41:21 PM By: Shawn Stall RN, BSN Entered By: Shawn Stall on 09/14/2022 09:38:59 -------------------------------------------------------------------------------- Nutrition Risk Screening Details Patient Name: Date of Service: [name]. 09/14/2022 9:30 [name] M Medical Record Number: [id number] Patient Account Number: [id number] Date of Birth/Sex: Treating RN: 1929/01/27 (89 y.o. [name] Primary Care Ramari Bray: Ezequiel Kayser Other Clinician: Referring Tessah Patchen: Treating Yolandra Habig/Extender: Jenene Slicker in Treatment: 0 Height (in): 58 Weight (lbs): 88 Body Mass Index (BMI): 18.4 Mccann, [name] ([id number]) [id number] Page 4 of 4 Nutrition Risk Screening Items Score Screening NUTRITION RISK SCREEN: I have an illness or condition that made me change the kind and/or amount of food I eat 2 Yes I eat fewer than two meals per day 0 No I eat few fruits and vegetables, or milk products 0 No I have three or more drinks of beer, liquor or wine almost every day 0 No I have tooth or mouth problems that make it hard for me to eat 0 No I don't always have enough money to buy the food I need 0 No I eat alone most of the time 0 No I take three or more different prescribed or over-the-counter drugs [name] day 1 Yes Without wanting to, I have lost or gained 10 pounds in the last six months 2 Yes I am not always physically able to shop, cook and/or feed myself 0 No Nutrition Protocols Good Risk Protocol Moderate Risk Protocol 0 Provide education on nutrition High Risk Proctocol Risk Level: Moderate Risk Score: 5 Electronic Signature(s) Signed: 09/14/2022 2:41:21 PM By: Shawn Stall RN,  BSN Entered By: Shawn Stall on 09/14/2022 09:31:07

## 2022-09-15 NOTE — Progress Notes (Signed)
Selden, [name] ([id number]) [id number] Page 1 of 7 Visit Report for 09/14/2022 Chief Complaint Document Details Patient Name: Date of Service: [name], [name] 09/14/2022 9:30 [name] [name] Medical Record Number: [id number] Patient Account Number: [id number] Date of Birth/Sex: Treating RN: [birth year] (89 y.o. F) Primary Care Provider: Ezequiel Kayser Other Clinician: Referring Provider: Treating Provider/Extender: Jenene Slicker in Treatment: 0 Information Obtained from: Patient Chief Complaint 09/14/2022; left second toe wound with exposed bone Electronic Signature(s) Signed: 09/14/2022 11:07:03 AM By: Geralyn Corwin DO Entered By: Geralyn Corwin on 09/14/2022 10:53:43 -------------------------------------------------------------------------------- HPI Details Patient Name: Date of Service: [name]. 09/14/2022 9:30 [name] [name] Medical Record Number: [id number] Patient Account Number: [id number] Date of Birth/Sex: Treating RN: [birth year] (89 y.o. 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 She thinks this started by dropping an object to the area. She has been following with Dr. Loreta Ave, podiatry for this issue. She has been on 6 weeks of oral antibiotics antibiotics including doxycycline, Augmentin and cephalexin. She has tried different ointments including antibiotic ointment and Medihoney. She is also tried silver alginate. Despite all this she still has an open wound with bone exposed. Her ABIs are normal and has an TBI on the left is  0.59. She did have an arteriogram of the left lower extremity that showed no significant aortoiliac occlusive disease or significant outflow disease. She has [name] three-vessel runoff to the ankle with posterior tibial being the dominant vessel across the ankle. Her vessels are small in caliber. Diffuse disease out onto the foot. Blood flow to the left foot is optimized. She had an x-ray done on 08/11/2022 that was concerning for osteomyelitis. Discussions were had with Dr. Loreta Ave about amputation. Patient is hesitant and would like to discuss limb salvage. Electronic Signature(s) Signed: 09/14/2022 11:07:03 AM By: Geralyn Corwin DO Entered By: Geralyn Corwin on 09/14/2022 10:59:00 -------------------------------------------------------------------------------- Physical Exam Details Patient Name: Date of Service: [name]. 09/14/2022 9:30 [name] [name] Medical Record Number: [id number] Patient Account Number: [id number] Date of Birth/Sex: Treating RN: 01-Dec-1928 (89 y.o. F) Primary Care Provider: Ezequiel Kayser Other Clinician: Referring Provider: Treating Provider/Extender: Jenene Slicker in Treatment: 0 Constitutional respirations regular, non-labored and within target range for patient.. Cardiovascular 2+ dorsalis pedis/posterior tibialis pulses. Psychiatric pleasant and cooperative. [name] ([id number]) [id number] Page 2 of 7 Notes Open wound to the second left toe with granulation tissue and exposed bone. Swelling to the digit. No increased warmth, purulent drainage or erythema. Electronic Signature(s) Signed: 09/14/2022 11:07:03 AM By: Geralyn Corwin DO Entered By: Geralyn Corwin on 09/14/2022 10:59:17 -------------------------------------------------------------------------------- Physician Orders Details Patient Name: Date of Service: [name]. 09/14/2022 9:30 [name] [name] Medical Record Number: [id number] Patient Account  Number: [id number] Date of Birth/Sex: Treating RN: [birth year] (89 y.o. Arta Silence Primary Care Provider: Ezequiel Kayser Other Clinician: Referring Provider: Treating Provider/Extender: Jenene Slicker in Treatment: 0 Verbal / Phone Orders: No Diagnosis Coding ICD-10 Coding Code Description [phone] Non-pressure chronic ulcer of other part of left foot with necrosis of bone I70.245 Atherosclerosis of native arteries of left leg with ulceration of  other part of foot I10 Essential (primary) hypertension Follow-up Appointments ppointment in: - Call wound center to get back on the books for decision for wound care and hyperbarics. Return [name] Other: - Wound center will call [facility] to obtain the x-ray from March. Patient to decide bone biopsy, culture, antibiotics, and hyperbarics or amputation. Anesthetic (In clinic) Topical Lidocaine 4% applied to wound bed Bathing/ Shower/ Hygiene May shower with protection but do not get wound dressing(s) wet. Protect dressing(s) with water repellant cover (for example, large plastic bag) or [name] cast cover and may then take shower. Off-Loading Open toe surgical shoe to: - left foot wear when walking. Wound Treatment Wound #1 - T Second oe Wound Laterality: Left Cleanser: Wound Cleanser 1 x Per Day/30 Days Discharge Instructions: Cleanse the wound with wound cleanser prior to applying [name] clean dressing using gauze sponges, not tissue or cotton balls. Topical: Gentamicin 1 x Per Day/30 Days Discharge Instructions: As directed by physician Secondary Dressing: Woven Gauze Sponges 2x2 in (DME) (Generic) 1 x Per Day/30 Days Discharge Instructions: Apply over primary dressing as directed. Secured With: 9M Medipore Scientist, research (life sciences) Surgical T 2x10 (in/yd) (DME) (Generic) 1 x Per Day/30 Days ape Discharge Instructions: Secure with tape as directed. Patient Medications llergies: CellCept, COVID-19 (SARS-CoV-2) vaccine,  Ad26 [name] Notifications Medication Indication Start End applied for any 09/14/2022 lidocaine debridements. DOSE topical 4 % gel - gel topical once daily Electronic Signature(s) Signed: 09/14/2022 11:07:03 AM By: Geralyn Corwin DO Entered By: Geralyn Corwin on 09/14/2022 10:59:29 Cedrone, Nahdia [name] ([id number]) [id number] Page 3 of 7 -------------------------------------------------------------------------------- Problem List Details Patient Name: Date of Service: [name], [name]. 09/14/2022 9:30 [name] [name] Medical Record Number: [id number] Patient Account Number: [id number] Date of Birth/Sex: Treating RN: September 06, 1928 (89 y.o. F) Primary Care Provider: Ezequiel Kayser Other Clinician: Referring Provider: Treating Provider/Extender: Jenene Slicker in Treatment: 0 Active Problems ICD-10 Encounter Code Description Active Date MDM Diagnosis L97.524 Non-pressure chronic ulcer of other part of left foot with necrosis of bone 09/14/2022 No Yes I70.245 Atherosclerosis of native arteries of left leg with ulceration of other part of 09/14/2022 No Yes foot M86.672 Other chronic osteomyelitis, left ankle and foot 09/14/2022 No Yes E11.621 Type 2 diabetes mellitus with foot ulcer 09/14/2022 No Yes I10 Essential (primary) hypertension 09/14/2022 No Yes Inactive Problems Resolved Problems Electronic Signature(s) Signed: 09/14/2022 11:07:03 AM By: Geralyn Corwin DO Entered By: Geralyn Corwin on 09/14/2022 10:53:32 -------------------------------------------------------------------------------- Progress Note Details Patient Name: Date of Service: [name]. 09/14/2022 9:30 [name] [name] Medical Record Number: [id number] Patient Account Number: [id number] Date of Birth/Sex: Treating RN: May 07, 1929 (89 y.o. F) Primary Care Provider: Ezequiel Kayser Other Clinician: Referring Provider: Treating Provider/Extender: Jenene Slicker in Treatment:  0 Subjective Chief Complaint Information obtained from Patient 09/14/2022; left second toe wound with exposed bone History of Present Illness (HPI) 09/14/2022 Ms. 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 She thinks this started by dropping an object to the area. She has been following with Dr. Loreta Ave, podiatry for this issue. She has been on 6 weeks of oral antibiotics antibiotics including doxycycline, Augmentin and cephalexin. She has tried different ointments including antibiotic ointment and Medihoney. She is also tried silver alginate. Despite all this she still has an open wound  with bone exposed. Her ABIs are normal and has an TBI on the left is 0.59. She did have an arteriogram of the left lower extremity that showed no significant aortoiliac occlusive disease or significant outflow disease. She has [name] three-vessel runoff to the ankle with posterior tibial being the dominant vessel across the ankle. Her vessels are small in caliber. Diffuse disease out onto the foot. Blood flow to the left foot is optimized. She had an x-ray done on 08/11/2022 that was concerning for osteomyelitis. Discussions were had with Dr. Loreta Ave about amputation. Patient is hesitant and would like to discuss limb salvage. Rubin, [name] ([id number]) [id number] Page 4 of 7 Patient History Information obtained from Patient, Chart. Allergies CellCept, COVID-19 (SARS-CoV-2) vaccine, Ad26 Family History Unknown History. Social History Never smoker, Marital Status - Single, Alcohol Use - Never, Drug Use - No History, Caffeine Use - Rarely. Medical History Cardiovascular Patient has history of Hypertension Hospitalization/Surgery History - abdominal aortogram w/ lower extremity. - mastectomy-  LEFT - abdominal hysterectomy. - cataract extraction-bilateral. - . tonsillectomy. Review of Systems (ROS) Constitutional Symptoms (General Health) Denies complaints or symptoms of Fatigue, Fever, Chills, Marked Weight Change. Eyes Denies complaints or symptoms of Dry Eyes, Vision Changes, Glasses / Contacts. Ear/Nose/Mouth/Throat Denies complaints or symptoms of Chronic sinus problems or rhinitis. Respiratory Denies complaints or symptoms of Chronic or frequent coughs, Shortness of Breath. Cardiovascular hypercholesteremia, atherosclerosis of aorta Gastrointestinal GERD, diverticulosis Endocrine Denies complaints or symptoms of Heat/cold intolerance. Genitourinary Denies complaints or symptoms of Frequent urination. Integumentary (Skin) Complains or has symptoms of Wounds. Musculoskeletal osteopenia,osteoporosis Neurologic Denies complaints or symptoms of Numbness/parasthesias. Oncologic breast cancer 1986 left s/p radiation Psychiatric Denies complaints or symptoms of Claustrophobia. Objective Constitutional respirations regular, non-labored and within target range for patient.. Vitals Time Taken: 9:26 AM, Height: 58 in, Source: Stated, Weight: 88 lbs, Source: Stated, BMI: 18.4, Temperature: 97.5 F, Pulse: 99 bpm, Respiratory Rate: 16 breaths/min, Blood Pressure: 135/74 mmHg. Cardiovascular 2+ dorsalis pedis/posterior tibialis pulses. Psychiatric pleasant and cooperative. General Notes: Open wound to the second left toe with granulation tissue and exposed bone. Swelling to the digit. No increased warmth, purulent drainage or erythema. Integumentary (Hair, Skin) Wound #1 status is Open. Original cause of wound was Gradually Appeared. The date acquired was: 08/27/2020. The wound is located on the Left T Second. oe The wound measures 0.6cm length x 0.7cm width x 0.2cm depth; 0.33cm^2 area and 0.066cm^3 volume. There is bone and Fat Layer (Subcutaneous Tissue) exposed.  There is no tunneling or undermining noted. There is [name] medium amount of serosanguineous drainage noted. The wound margin is distinct with the outline attached to the wound base. There is medium (34-66%) red, pink granulation within the wound bed. There is [name] medium (34-66%) amount of necrotic tissue within the wound bed including Adherent Slough. The periwound skin appearance exhibited: Erythema. The periwound skin appearance did not exhibit: Callus, Crepitus, Excoriation, Induration, Rash, Scarring, Dry/Scaly, Maceration, Atrophie Blanche, Cyanosis, Ecchymosis, Hemosiderin Staining, Mottled, Pallor, Rubor. The surrounding wound skin color is noted with erythema which is circumferential. Periwound temperature was noted as Hot. The periwound has tenderness on palpation. Boldman, [name] ([id number]) [id number] Page 5 of 7 Assessment Active Problems ICD-10 Non-pressure chronic ulcer of other part of left foot with necrosis of bone Atherosclerosis of native arteries of left leg with ulceration of other part of foot Other chronic osteomyelitis, left ankle and foot Type 2 diabetes mellitus with foot ulcer Essential (primary) hypertension Patient presents with an 40-month history of  nonhealing ulcer to the second left toe secondary to trauma and nonhealing due to chronic osteomyelitis. Despite 3 different antibiotics over 6 weeks patient's wound has not improved. She is currently off antibiotics. We had [name] long discussion about doing [name] bone culture and HBO therapy vs amputation. She states she would like to think about this and she will let us know next week which route she would like to take. For now I recommended continuing with antibiotic ointment to the area daily and keeping it covered. Plan Follow-up Appointments: Return Appointment in: - Call wound center to get back on the books for decision for wound care and hyperbarics. Other: - Wound center will call [facility] to obtain the x-ray from March. Patient to decide bone biopsy, culture, antibiotics, and hyperbarics or amputation. Anesthetic: (In clinic) Topical Lidocaine 4% applied to wound bed Bathing/ Shower/ Hygiene: May shower with protection but do not get wound dressing(s) wet. Protect dressing(s) with water repellant cover (for example, large plastic bag) or [name] cast cover and may then take shower. Off-Loading: Open toe surgical shoe to: - left foot wear when walking. The following medication(s) was prescribed: lidocaine topical 4 % gel gel topical once daily for applied for any debridements. was prescribed at facility WOUND #1: - T Second Wound Laterality: Left oe Cleanser: Wound Cleanser 1 x Per Day/30 Days Discharge Instructions: Cleanse the wound with wound cleanser prior to applying [name] clean dressing using gauze sponges, not tissue or cotton balls. Topical: Gentamicin 1 x Per Day/30 Days Discharge Instructions: As directed by physician Secondary Dressing: Woven Gauze Sponges 2x2 in (DME) (Generic) 1 x Per Day/30 Days Discharge Instructions: Apply over primary dressing as directed. Secured With: 91M Medipore Scientist, research (life sciences) Surgical T 2x10 (in/yd) (DME) (Generic) 1 x Per Day/30 Days ape Discharge Instructions: Secure with tape as directed. 1. Patient to call the wound care center for follow-up appointment 2. Antibiotic ointment 3. Continue to follow with podiatry Electronic Signature(s) Signed: 09/14/2022 11:07:03 AM By: Geralyn Corwin DO Entered By: Geralyn Corwin on 09/14/2022 11:02:20 -------------------------------------------------------------------------------- HxROS Details Patient Name: Date of Service: [name]. 09/14/2022 9:30 [name] [name] Medical Record Number: [id number] Patient Account Number: [id number] Date of Birth/Sex: Treating RN: 02-02-1929 (89 y.o. Ardis Rowan, [name] Primary Care Provider: Ezequiel Kayser Other Clinician: Referring Provider: Treating  Provider/Extender: Jenene Slicker in Treatment: 0 Information Obtained From Patient Chart Constitutional Symptoms (General Health) Complaints and Symptoms: Negative for: Fatigue; Fever; Chills; Marked Weight Change Eyes Complaints and Symptoms: Negative for: Dry Eyes; Vision Changes; Glasses / Contacts Cerezo, [name] ([id number]) [id number] Page 6 of 7 Ear/Nose/Mouth/Throat Complaints and Symptoms: Negative for: Chronic sinus problems or rhinitis Respiratory Complaints and Symptoms: Negative for: Chronic or frequent coughs; Shortness of Breath Endocrine Complaints and Symptoms: Negative for: Heat/cold intolerance Genitourinary Complaints and Symptoms: Negative for: Frequent urination Integumentary (Skin) Complaints and Symptoms: Positive for: Wounds Neurologic Complaints and Symptoms: Negative for: Numbness/parasthesias Psychiatric Complaints and Symptoms: Negative for: Claustrophobia Hematologic/Lymphatic Cardiovascular Complaints and Symptoms: Review of System Notes: hypercholesteremia, atherosclerosis of aorta Medical History: Positive for: Hypertension Gastrointestinal Complaints and Symptoms: Review of System Notes: GERD, diverticulosis Immunological Musculoskeletal Complaints and Symptoms: Review of System Notes: osteopenia,osteoporosis Oncologic Complaints and Symptoms: Review of System Notes: breast cancer 1986 left s/p radiation Immunizations Pneumococcal Vaccine: Received Pneumococcal Vaccination: Yes Received Pneumococcal Vaccination On or After 60th Birthday: Yes Implantable Devices None Hospitalization / Surgery History Repka, [name] ([id number]) [id number] Page 7 of 7 Type of Hospitalization/Surgery  abdominal aortogram w/ lower extremity mastectomy- LEFT abdominal hysterectomy cataract extraction-bilateral tonsillectomy Family and Social History Unknown  History: Yes; Never smoker; Marital Status - Single; Alcohol Use: Never; Drug Use: No History; Caffeine Use: Rarely; Financial Concerns: No; Food, Clothing or Shelter Needs: No; Support System Lacking: No; Transportation Concerns: No Electronic Signature(s) Signed: 09/14/2022 11:07:03 AM By: Geralyn Corwin DO Signed: 09/15/2022 11:19:08 AM By: Fonnie Mu RN Entered By: Fonnie Mu on 09/13/2022 11:20:57 -------------------------------------------------------------------------------- SuperBill Details Patient Name: Date of Service: [name]. 09/14/2022 Medical Record Number: [id number] Patient Account Number: [id number] Date of Birth/Sex: Treating RN: 08-May-1929 (89 y.o. Debara Pickett, [name] Primary Care Provider: Ezequiel Kayser Other Clinician: Referring Provider: Treating Provider/Extender: Jenene Slicker in Treatment: 0 Diagnosis Coding ICD-10 Codes Code Description [phone] Non-pressure chronic ulcer of other part of left foot with necrosis of bone I70.245 Atherosclerosis of native arteries of left leg with ulceration of other part of foot M86.672 Other chronic osteomyelitis, left ankle and foot E11.621 Type 2 diabetes mellitus with foot ulcer I10 Essential (primary) hypertension Facility Procedures : CPT4 Code: [id number] Description: 99214 - WOUND CARE VISIT-LEV 4 EST PT Modifier: Quantity: 1 Physician Procedures : CPT4 Code Description Modifier [id number] - WC PHYS LEVEL 4 - NEW PT ICD-10 Diagnosis Description L97.524 Non-pressure chronic ulcer of other part of left foot with necrosis of bone M86.672 Other chronic osteomyelitis, left ankle and foot E11.621  Type 2 diabetes mellitus with foot ulcer I70.245 Atherosclerosis of native arteries of left leg with ulceration of other part of foot Quantity: 1 Electronic Signature(s) Signed: 09/14/2022 11:07:03 AM By: Geralyn Corwin DO Entered By: Geralyn Corwin on 09/14/2022 11:02:40

## 2022-09-15 NOTE — Progress Notes (Addendum)
Copelin, Stacy A ([id number]) [id number] Page 1 of 8 Visit Report for 09/14/2022 Allergy List Details Patient Name: Date of Service: SUKI, MARCELLUS A. 09/14/2022 9:30 A M Medical Record Number: [id number] Patient Account Number: [id number] Date of Birth/Sex: Treating RN: [birth year] (89 y.o. Ardis Rowan, Lauren Primary Care Madilynne Mullan: Ezequiel Kayser Other Clinician: Referring Taygen Acklin: Treating Jadelyn Elks/Extender: Jenene Slicker in Treatment: 0 Allergies Active Allergies CellCept COVID-19 (SARS-CoV-2) vaccine, Ad26 Allergy Notes Electronic Signature(s) Signed: 09/15/2022 11:19:08 AM By: Fonnie Mu RN Entered By: Fonnie Mu on 09/13/2022 11:18:01 -------------------------------------------------------------------------------- Arrival Information Details Patient Name: Date of Service: Carlynn Spry A. 09/14/2022 9:30 A M Medical Record Number: [id number] Patient Account Number: [id number] Date of Birth/Sex: Treating RN: April 10, 1929 (89 y.o. Arta Silence Primary Care Alazay Leicht: Ezequiel Kayser Other Clinician: Referring Savion Washam: Treating Kasin Tonkinson/Extender: Jenene Slicker in Treatment: 0 Visit Information Patient Arrived: Danella Maiers Time: 09:23 Accompanied By: friend Transfer Assistance: None Patient Identification Verified: Yes Secondary Verification Process Completed: Yes Patient Requires Transmission-Based No Precautions: Patient Has Alerts: Yes Patient Alerts: ABI VVS:06/25/22 0.59 VVS LLE angiogram 08/08/22 Electronic Signature(s) Signed: 09/14/2022 2:41:21 PM By: Shawn Stall RN, BSN Entered By: Shawn Stall on 09/14/2022 09:36:47 -------------------------------------------------------------------------------- Clinic Level of Care Assessment Details Patient Name: Date of Service: Whilden, Gigi Gin A. 09/14/2022 9:30 A M Medical Record Number: [id number] Patient Account Number: [id number] Date of  Birth/Sex: Treating RN: 25-May-1929 (89 y.o. Arta Silence Primary Care Effrey Davidow: Ezequiel Kayser Other Clinician: Referring Adonus Uselman: Treating Dellene Mcgroarty/Extender: Jenene Slicker in Treatment: 0 Clinic Level of Care Assessment Items TOOL 2 Quantity Score X- 1 0 Use when only an EandM is performed on the INITIAL visit Hanken, Lanissa A ([id number]) [id number] Page 2 of 8 ASSESSMENTS - Nursing Assessment / Reassessment X- 1 20 General Physical Exam (combine w/ comprehensive assessment (listed just below) when performed on new pt. evals) X- 1 25 Comprehensive Assessment (HX, ROS, Risk Assessments, Wounds Hx, etc.) ASSESSMENTS - Wound and Skin A ssessment / Reassessment X - Simple Wound Assessment / Reassessment - one wound 1 5 []  - 0 Complex Wound Assessment / Reassessment - multiple wounds []  - 0 Dermatologic / Skin Assessment (not related to wound area) ASSESSMENTS - Ostomy and/or Continence Assessment and Care []  - 0 Incontinence Assessment and Management []  - 0 Ostomy Care Assessment and Management (repouching, etc.) PROCESS - Coordination of Care []  - 0 Simple Patient / Family Education for ongoing care X- 1 20 Complex (extensive) Patient / Family Education for ongoing care X- 1 10 Staff obtains Chiropractor, Records, T Results / Process Orders est []  - 0 Staff telephones HHA, Nursing Homes / Clarify orders / etc []  - 0 Routine Transfer to another Facility (non-emergent condition) []  - 0 Routine Hospital Admission (non-emergent condition) X- 1 15 New Admissions / Manufacturing engineer / Ordering NPWT Apligraf, etc. , []  - 0 Emergency Hospital Admission (emergent condition) []  - 0 Simple Discharge Coordination X- 1 15 Complex (extensive) Discharge Coordination PROCESS - Special Needs []  - 0 Pediatric / Minor Patient Management []  - 0 Isolation Patient Management []  - 0 Hearing / Language / Visual special needs []   - 0 Assessment of Community assistance (transportation, D/C planning, etc.) []  - 0 Additional assistance / Altered mentation []  - 0 Support Surface(s) Assessment (bed, cushion, seat, etc.) INTERVENTIONS - Wound Cleansing / Measurement X- 1 5 Wound Imaging (photographs - any number of wounds) []  - 0 Wound Tracing (instead of photographs) X- 1 5 Simple  Wound Measurement - one wound []  - 0 Complex Wound Measurement - multiple wounds X- 1 5 Simple Wound Cleansing - one wound []  - 0 Complex Wound Cleansing - multiple wounds INTERVENTIONS - Wound Dressings X - Small Wound Dressing one or multiple wounds 1 10 []  - 0 Medium Wound Dressing one or multiple wounds []  - 0 Large Wound Dressing one or multiple wounds X- 1 10 Application of Medications - injection INTERVENTIONS - Miscellaneous []  - 0 External ear exam []  - 0 Specimen Collection (cultures, biopsies, blood, body fluids, etc.) []  - 0 Specimen(s) / Culture(s) sent or taken to Lab for analysis []  - 0 Patient Transfer (multiple staff / Teddy Spike / Similar devices) Gildner, Yue A ([id number]) [id number] Page 3 of 8 []  - 0 Simple Staple / Suture removal (25 or less) []  - 0 Complex Staple / Suture removal (26 or more) []  - 0 Hypo / Hyperglycemic Management (close monitor of Blood Glucose) []  - 0 Ankle / Brachial Index (ABI) - do not check if billed separately Has the patient been seen at the hospital within the last three years: Yes Total Score: 145 Level Of Care: New/Established - Level 4 Electronic Signature(s) Signed: 09/14/2022 2:41:21 PM By: Shawn Stall RN, BSN Entered By: Shawn Stall on 09/14/2022 10:21:15 -------------------------------------------------------------------------------- Encounter Discharge Information Details Patient Name: Date of Service: Carlynn Spry A. 09/14/2022 9:30 A M Medical Record Number: [id number] Patient Account Number: [id number] Date of Birth/Sex: Treating  RN: [birth year] (89 y.o. Arta Silence Primary Care Celvin Taney: Ezequiel Kayser Other Clinician: Referring Gregory Barrick: Treating Alaster Asfaw/Extender: Jenene Slicker in Treatment: 0 Encounter Discharge Information Items Discharge Condition: Stable Ambulatory Status: Cane Discharge Destination: Home Transportation: Private Auto Accompanied By: friend Schedule Follow-up Appointment: Yes Clinical Summary of Care: Electronic Signature(s) Signed: 09/14/2022 2:41:21 PM By: Shawn Stall RN, BSN Entered By: Shawn Stall on 09/14/2022 10:24:33 -------------------------------------------------------------------------------- Lower Extremity Assessment Details Patient Name: Date of Service: Carlynn Spry A. 09/14/2022 9:30 A M Medical Record Number: [id number] Patient Account Number: [id number] Date of Birth/Sex: Treating RN: 06-17-1928 (89 y.o. Arta Silence Primary Care Heidi Lemay: Ezequiel Kayser Other Clinician: Referring Jaeline Whobrey: Treating Sanaiya Welliver/Extender: Jenene Slicker in Treatment: 0 Edema Assessment Assessed: Kyra Searles: Yes] [Right: No] Edema: [Left: N] [Right: o] Calf Left: Right: Point of Measurement: 31 cm From Medial Instep 26.5 cm Ankle Left: Right: Point of Measurement: 7 cm From Medial Instep 17 cm Knee To Floor Left: Right: From Medial Instep 41 cm Vascular Assessment Pulses: Cancro, Leylany A ([id number]) [Right:[id number] Page 4 of 8] Dorsalis Pedis Palpable: [Left:Yes] Electronic Signature(s) Signed: 09/14/2022 2:41:21 PM By: Shawn Stall RN, BSN Entered By: Shawn Stall on 09/14/2022 09:38:30 -------------------------------------------------------------------------------- Multi Wound Chart Details Patient Name: Date of Service: Carlynn Spry A. 09/14/2022 9:30 A M Medical Record Number: [id number] Patient Account Number: [id number] Date of Birth/Sex: Treating RN: July 29, 1928 (89 y.o. F) Primary Care  Roey Coopman: Ezequiel Kayser Other Clinician: Referring Caydee Talkington: Treating Shalyn Koral/Extender: Jenene Slicker in Treatment: 0 Vital Signs Height(in): 58 Pulse(bpm): 99 Weight(lbs): 88 Blood Pressure(mmHg): 135/74 Body Mass Index(BMI): 18.4 Temperature(F): 97.5 Respiratory Rate(breaths/min): 16 [1:Photos:] [N/A:N/A] Left T Second oe N/A N/A Wound Location: Gradually Appeared N/A N/A Wounding Event: Arterial Insufficiency Ulcer N/A N/A Primary Etiology: Hypertension N/A N/A Comorbid History: 08/27/2020 N/A N/A Date Acquired: 0 N/A N/A Weeks of Treatment: Open N/A N/A Wound Status: No N/A N/A Wound Recurrence: 0.6x0.7x0.2 N/A N/A Measurements L x W x D (cm) 0.33 N/A N/A A (cm) :  rea 0.066 N/A N/A Volume (cm) : Full Thickness With Exposed Support N/A N/A Classification: Structures Medium N/A N/A Exudate Amount: Serosanguineous N/A N/A Exudate Type: red, brown N/A N/A Exudate Color: Distinct, outline attached N/A N/A Wound Margin: Medium (34-66%) N/A N/A Granulation Amount: Red, Pink N/A N/A Granulation Quality: Medium (34-66%) N/A N/A Necrotic Amount: Fat Layer (Subcutaneous Tissue): Yes N/A N/A Exposed Structures: Bone: Yes Fascia: No Tendon: No Muscle: No Joint: No Small (1-33%) N/A N/A Epithelialization: Excoriation: No N/A N/A Periwound Skin Texture: Induration: No Callus: No Crepitus: No Rash: No Scarring: No Maceration: No N/A N/A Periwound Skin Moisture: Dry/Scaly: No Erythema: Yes N/A N/A Periwound Skin Color: Atrophie Blanche: No Cyanosis: No Ecchymosis: No Hemosiderin Staining: No Mottled: No Pallor: No Shatswell, Londin A ([id number]) [id number] Page 5 of 8 Rubor: No Circumferential N/A N/A Erythema Location: Hot N/A N/A Temperature: Yes N/A N/A Tenderness on Palpation: Treatment Notes Wound #1 (Toe Second) Wound Laterality: Left Cleanser Wound Cleanser Discharge Instruction:  Cleanse the wound with wound cleanser prior to applying a clean dressing using gauze sponges, not tissue or cotton balls. Peri-Wound Care Topical Gentamicin Discharge Instruction: As directed by physician Primary Dressing Secondary Dressing Woven Gauze Sponges 2x2 in Discharge Instruction: Apply over primary dressing as directed. Secured With [employer] T 2x10 (in/yd) ape Discharge Instruction: Secure with tape as directed. Compression Wrap Compression Stockings Add-Ons Electronic Signature(s) Signed: 09/14/2022 11:07:03 AM By: Geralyn Corwin DO Entered By: Geralyn Corwin on 09/14/2022 10:53:36 -------------------------------------------------------------------------------- Multi-Disciplinary Care Plan Details Patient Name: Date of Service: Carlynn Spry A. 09/14/2022 9:30 A M Medical Record Number: [id number] Patient Account Number: [id number] Date of Birth/Sex: Treating RN: [birth year] (89 y.o. Arta Silence Primary Care Rudolph Dobler: Ezequiel Kayser Other Clinician: Referring Celine Dishman: Treating Mabell Esguerra/Extender: Jenene Slicker in Treatment: 0 Active Inactive Electronic Signature(s) Signed: 10/03/2022 11:05:49 AM By: Shawn Stall RN, BSN Previous Signature: 09/14/2022 2:41:21 PM Version By: Shawn Stall RN, BSN Entered By: Shawn Stall on 10/03/2022 11:05:49 -------------------------------------------------------------------------------- Pain Assessment Details Patient Name: Date of Service: Carlynn Spry A. 09/14/2022 9:30 A M Medical Record Number: [id number] Patient Account Number: [id number] Date of Birth/Sex: Treating RN: [birth year] (89 y.o. Arta Silence Primary Care Albino Bufford: Ezequiel Kayser Other Clinician: Referring Chosen Geske: Treating Sueo Cullen/Extender: Jenene Slicker in Treatment: 0 Active Problems Acevedo, Finnley A ([id number]) [id number] Page 6 of 8 Location of Pain  Severity and Description of Pain Patient Has Paino Yes Site Locations Pain Location: Pain in Ulcers Rate the pain. Current Pain Level: 5 Worst Pain Level: 10 Least Pain Level: 0 Tolerable Pain Level: 8 Pain Management and Medication Current Pain Management: Medication: No Cold Application: No Rest: No Massage: No Activity: No T.E.N.S.: No Heat Application: No Leg drop or elevation: No Is the Current Pain Management Adequate: Adequate How does your wound impact your activities of daily livingo Sleep: No Bathing: No Appetite: No Relationship With Others: No Bladder Continence: No Emotions: No Bowel Continence: No Work: No Toileting: No Drive: No Dressing: No Hobbies: No Notes pain at times. Electronic Signature(s) Signed: 09/14/2022 2:41:21 PM By: Shawn Stall RN, BSN Entered By: Shawn Stall on 09/14/2022 09:31:39 -------------------------------------------------------------------------------- Patient/Caregiver Education Details Patient Name: Date of Service: Beverly Milch 4/18/2024andnbsp9:30 A M Medical Record Number: [id number] Patient Account Number: [id number] Date of Birth/Gender: Treating RN: 03/26/1929 (89 y.o. Debara Pickett, Yvonne Kendall Primary Care Physician: Ezequiel Kayser Other Clinician: Referring Physician: Treating Physician/Extender: Jenene Slicker in Treatment: 0 Education Assessment Education  Provided To: Patient and Caregiver friend Erskine Squibb Education Topics Provided Welcome T [facility] Packet: o Handouts: [facility] Pledge form Methods: Explain/Verbal Responses: Reinforcements needed Wound/Skin Impairment: Handouts: Caring for Your Ulcer Methods: Explain/Verbal Responses: Reinforcements needed Kempen, Asheton A ([id number]) [id number] Page 7 of 8 Electronic Signature(s) Signed: 09/14/2022 2:41:21 PM By: Shawn Stall RN, BSN Entered By: Shawn Stall on 09/14/2022  10:04:03 -------------------------------------------------------------------------------- Wound Assessment Details Patient Name: Date of Service: Carlynn Spry A. 09/14/2022 9:30 A M Medical Record Number: [id number] Patient Account Number: [id number] Date of Birth/Sex: Treating RN: 09/26/1928 (89 y.o. Debara Pickett, [name] Primary Care Zaviyar Rahal: Ezequiel Kayser Other Clinician: Referring Kirby Argueta: Treating Criss Pallone/Extender: Jenene Slicker in Treatment: 0 Wound Status Wound Number: 1 Primary Etiology: Arterial Insufficiency Ulcer Wound Location: Left T Second oe Wound Status: Open Wounding Event: Gradually Appeared Comorbid History: Hypertension Date Acquired: 08/27/2020 Weeks Of Treatment: 0 Clustered Wound: No Photos Wound Measurements Length: (cm) 0.6 Width: (cm) 0.7 Depth: (cm) 0.2 Area: (cm) 0.33 Volume: (cm) 0.066 % Reduction in Area: % Reduction in Volume: Epithelialization: Small (1-33%) Tunneling: No Undermining: No Wound Description Classification: Full Thickness With Exposed Suppo Wound Margin: Distinct, outline attached Exudate Amount: Medium Exudate Type: Serosanguineous Exudate Color: red, brown rt Structures Foul Odor After Cleansing: No Slough/Fibrino Yes Wound Bed Granulation Amount: Medium (34-66%) Exposed Structure Granulation Quality: Red, Pink Fascia Exposed: No Necrotic Amount: Medium (34-66%) Fat Layer (Subcutaneous Tissue) Exposed: Yes Necrotic Quality: Adherent Slough Tendon Exposed: No Muscle Exposed: No Joint Exposed: No Bone Exposed: Yes Periwound Skin Texture Texture Color No Abnormalities Noted: No No Abnormalities Noted: No Callus: No Atrophie Blanche: No Crepitus: No Cyanosis: No Excoriation: No Ecchymosis: No Induration: No Erythema: Yes Rash: No Erythema Location: Circumferential Scarring: No Hemosiderin Staining: No Mottled: No Moisture Pallor: No No Abnormalities Noted: No Membreno, Loza A  ([id number]) [id number] Page 8 of 8 Rubor: No Dry / Scaly: No Maceration: No Temperature / Pain Temperature: Hot Tenderness on Palpation: Yes Electronic Signature(s) Signed: 09/14/2022 2:41:21 PM By: Shawn Stall RN, BSN Signed: 09/15/2022 11:38:03 AM By: Thayer Dallas Entered By: Thayer Dallas on 09/14/2022 09:37:42 -------------------------------------------------------------------------------- Vitals Details Patient Name: Date of Service: Carlynn Spry A. 09/14/2022 9:30 A M Medical Record Number: [id number] Patient Account Number: [id number] Date of Birth/Sex: Treating RN: 1928-11-19 (89 y.o. Debara Pickett, [name] Primary Care Rejina Odle: Ezequiel Kayser Other Clinician: Referring Arkie Tagliaferro: Treating Dianah Pruett/Extender: Jenene Slicker in Treatment: 0 Vital Signs Time Taken: 09:26 Temperature (F): 97.5 Height (in): 58 Pulse (bpm): 99 Source: Stated Respiratory Rate (breaths/min): 16 Weight (lbs): 88 Blood Pressure (mmHg): 135/74 Source: Stated Reference Range: 80 - 120 mg / dl Body Mass Index (BMI): 18.4 Electronic Signature(s) Signed: 09/14/2022 2:41:21 PM By: Shawn Stall RN, BSN Entered By: Shawn Stall on 09/14/2022 09:26:47

## 2022-09-28 ENCOUNTER — Ambulatory Visit: Payer: Medicare Other | Admitting: Podiatry

## 2022-09-28 DIAGNOSIS — L97524 Non-pressure chronic ulcer of other part of left foot with necrosis of bone: Secondary | ICD-10-CM

## 2022-09-28 MED ORDER — AMOXICILLIN-POT CLAVULANATE 875-125 MG PO TABS: 1.0000 | ORAL_TABLET | Freq: Two times a day (BID) | ORAL | 0 refills | Status: DC

## 2022-09-29 ENCOUNTER — Telehealth: Payer: Self-pay | Admitting: *Deleted

## 2022-09-29 ENCOUNTER — Other Ambulatory Visit: Payer: Self-pay | Admitting: Podiatry

## 2022-09-29 MED ORDER — CEPHALEXIN 500 MG PO CAPS: 500.0000 mg | ORAL_CAPSULE | Freq: Three times a day (TID) | ORAL | 0 refills | Status: DC

## 2022-09-29 NOTE — Telephone Encounter (Signed)
Patient is calling because the Augmentin prescribed causing severe side effects,not able to take, can something else be prescribed? Please advise.

## 2022-10-01 NOTE — Progress Notes (Signed)
Subjective: Chief Complaint  Patient presents with   Wound Check    Toe ulcer to left foot. Patient is covering ulcer with a bandaid.      89 year old female presents the office today for the above concerns.  She states that the wound is doing better and overall her foot is feeling better.  She has not seen any drainage or pus.  She did go to the wound care center.  She is not able to do hyperbaric oxygen therapy given scheduling.    Objective: AAO x3, NAD DP/PT pulses palpable bilaterally, CRT less than 3 seconds On the dorsal aspect of the right second digit is a full-thickness granular wound with localized edema and erythema present of the toe appears to be localized.  There is no ascending cellulitis.  Hammertoe contractures present is 1 small area of relatively exposed.  There is no fluctuation or crepitation.  There is no malodor. No pain with calf compression, swelling, warmth, erythema  Assessment: Hammertoe contracture resulted in ulceration 2nd toe  Plan: -All treatment options discussed with the patient including all alternatives, risks, complications.  -Medically necessary wound debridement was performed.  Sharply debrided the wound down to healthy, bleeding tissue.  The exposed bone was also debrided today.  There is no purulence.  There is a rim of erythema without any ascending cellulitis.  Plan to restart antibiotics.  Keflex.  Offload at all times.  She is feeling fortunate this is a potation but since the wound is clinically doing better we will continue with conservative treatment to try to save the toe if able. -Monitor for any clinical signs or symptoms of infection and directed to call the office immediately should any occur or go to the ER.  Vivi Barrack DPM

## 2022-10-02 ENCOUNTER — Telehealth: Payer: Self-pay | Admitting: Podiatry

## 2022-10-02 NOTE — Telephone Encounter (Signed)
Pt left message Friday 5.3 at 344pm  and was stating she was seen 5.2 and was to have had an antibiotic called in and it was Augmentin and she is requesting a different antibiotic as she does not tolerate that well.  I checked chart and it appears it was taken care of and I called pt to make sure it was and she said yes and thank you for calling.

## 2022-10-13 ENCOUNTER — Ambulatory Visit: Payer: Medicare Other | Admitting: Podiatry

## 2022-10-13 ENCOUNTER — Encounter: Payer: Self-pay | Admitting: Podiatry

## 2022-10-13 ENCOUNTER — Ambulatory Visit (INDEPENDENT_AMBULATORY_CARE_PROVIDER_SITE_OTHER): Payer: Medicare Other

## 2022-10-13 DIAGNOSIS — L97524 Non-pressure chronic ulcer of other part of left foot with necrosis of bone: Secondary | ICD-10-CM

## 2022-10-13 MED ORDER — CEPHALEXIN 500 MG PO CAPS: 500.0000 mg | ORAL_CAPSULE | Freq: Three times a day (TID) | ORAL | 0 refills | Status: DC

## 2022-10-13 MED ORDER — MUPIROCIN 2 % EX OINT: 1.0000 | TOPICAL_OINTMENT | Freq: Two times a day (BID) | CUTANEOUS | 2 refills | Status: DC

## 2022-10-19 NOTE — Progress Notes (Signed)
Subjective: Chief Complaint  Patient presents with   Foot Ulcer    Follow up ulcer 2nd toe left   "It doing a little better I guess"    89 year old female presents the office today for the above concerns.  She thinks may be doing little better.  She has noticed today as well as swollen and red.  She does not report any fevers or chills or any drainage or pus or red streaks.  No other concerns.  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  Her vessels are small in caliber.  Diffuse disease out on to the foot.  Blood flow to the left foot is optimized              Objective: AAO x3, NAD DP/PT pulses palpable bilaterally, CRT less than 3 seconds On the dorsal aspect of the right second digit is a full-thickness granular wound with localized edema and erythema present of the toe appears to be localized.  There is no probing to bone today but the toe has changed position with transverse plane deformity and sitting on top of the third toe.  There is no fluctuance or crepitation but there is no odor. No pain with calf compression, swelling, warmth, erythema  Assessment: Hammertoe contracture resulted in ulceration 2nd toe  Plan: -All treatment options discussed with the patient including all alternatives, risks, complications.  -X-rays obtained reviewed.  There is cortical changes on the proximal phalanx dorsal and lateral view.  I did debride some of this last appointment as well.  There is no soft tissue edema. -Discussions in regards to try to save the toe still versus amputation.  Even for able to save the toe from a wound standpoint still concerned about the position long-term.  Discussed amputation of the toe.  She is going to think about this.  For now continue daily dressing changes.   Refill antibiotics.  Monitor any signs or symptoms of worsening infection report to emergency room should any occur.  Return for 7-10 days for toe ulcer.  Vivi Barrack DPM

## 2022-10-30 ENCOUNTER — Ambulatory Visit: Payer: Medicare Other | Admitting: Podiatry

## 2022-10-30 ENCOUNTER — Telehealth: Payer: Self-pay

## 2022-10-30 DIAGNOSIS — M86172 Other acute osteomyelitis, left ankle and foot: Secondary | ICD-10-CM

## 2022-10-30 DIAGNOSIS — L97524 Non-pressure chronic ulcer of other part of left foot with necrosis of bone: Secondary | ICD-10-CM | POA: Diagnosis not present

## 2022-10-30 NOTE — Progress Notes (Signed)
Subjective: Chief Complaint  Patient presents with   Foot Ulcer    Left toe ulcer      89 year old female presents the office today for the above concerns.  She states that it is feeling little bit better but the toe is still red and swollen.  No drainage or pus.  She does not report any fevers or chills.  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  Her vessels are small in caliber.  Diffuse disease out on to the foot.  Blood flow to the left foot is optimized              Objective: AAO x3, NAD DP/PT pulses palpable bilaterally, CRT less than 3 seconds On the dorsal aspect of the right second digit is a fibrogranular ulceration without any exposed bone but there is still edema and erythema present of the toe.  There is digital fomites present of the toe and the toe was sitting on top of the hallux, third toe with transverse plane deformity as well.  There is no fluctuation, crepitation, malodor.   No pain with calf compression, swelling, warmth, erythema  Assessment: Hammertoe contracture resulted in ulceration 2nd toe  Plan: -All treatment options discussed with the patient including all alternatives, risks, complications.  -I again had a long discussion with her in regards to treatment options.  We previously discussed surgical intervention however I am concerned still having swelling, redness of the toe as well as the ulceration.  She previously had exposed bone.  I do think there is still infection.  Even if the wound heals and the deformity of the toe is likely to cause issues.  She does well when she is on antibiotics but when she comes off of them the infection comes right back.  After discussion we discussed amputation of the toe and she wants to proceed with this. -Will  plan for second toe amputation left foot -She been optimized by vascular surgery -The incision placement as well as the postoperative course was discussed with the patient. I discussed risks of the surgery which include, but not limited to, infection, bleeding, pain, swelling, need for further surgery, delayed or nonhealing, painful or ugly scar, numbness or sensation changes,  recurrence, transfer lesions, further deformity,  DVT/PE, loss of toe/foot. Patient understands these risks and wishes to proceed with surgery. The surgical consent was reviewed with the patient all 3 pages were signed. No promises or guarantees were given to the outcome of the procedure. All questions were answered to the best of my ability. Before the surgery the patient was encouraged to call the office if there is any further questions. The surgery will be performed at the [facility] on an outpatient basis.  [name] DPM

## 2022-10-30 NOTE — Patient Instructions (Signed)
Pre-Operative Instructions  Congratulations, you have decided to take an important step to improving your quality of life.  You can be assured that the doctors of [facility] will be with you every step of the way.  Plan to be at the surgery center/hospital at least 1 (one) hour prior to your scheduled time unless otherwise directed by the surgical center/hospital staff.  You must have a responsible adult accompany you, remain during the surgery and drive you home.  Make sure you have directions to the surgical center/hospital and know how to get there on time. For hospital based surgery you will need to obtain a history and physical form from your family physician within 1 month prior to the date of surgery- we will give you a form for you primary physician.  We make every effort to accommodate the date you request for surgery.  There are however, times where surgery dates or times have to be moved.  We will contact you as soon as possible if a change in schedule is required.   No Aspirin/Ibuprofen for one week before surgery.  If you are on aspirin, any non-steroidal anti-inflammatory medications (Mobic, Aleve, Ibuprofen) you should stop taking it 7 days prior to your surgery.  You make take Tylenol  For pain prior to surgery.  Medications- If you are taking daily heart and blood pressure medications, seizure, reflux, allergy, asthma, anxiety, pain or diabetes medications, make sure the surgery center/hospital is aware before the day of surgery so they may notify you which medications to take or avoid the day of surgery. No food or drink after midnight the night before surgery unless directed otherwise by surgical center/hospital staff. No alcoholic beverages 24 hours prior to surgery.  No smoking 24 hours prior to or 24 hours after surgery. Wear loose pants or shorts- loose enough to fit over bandages, boots, and casts. No slip on shoes, sneakers are best. Bring your boot with you to the  surgery center/hospital.  Also bring crutches or a walker if your physician has prescribed it for you.  If you do not have this equipment, it will be provided for you after surgery. If you have not been contracted by the surgery center/hospital by the day before your surgery, call to confirm the date and time of your surgery. Leave-time from work may vary depending on the type of surgery you have.  Appropriate arrangements should be made prior to surgery with your employer. Prescriptions will be provided immediately following surgery by your doctor.  Have these filled as soon as possible after surgery and take the medication as directed. Remove nail polish on the operative foot. Wash the night before surgery.  The night before surgery wash the foot and leg well with the antibacterial soap provided and water paying special attention to beneath the toenails and in between the toes.  Rinse thoroughly with water and dry well with a towel.  Perform this wash unless told not to do so by your physician.  Enclosed: 1 Ice pack (please put in freezer the night before surgery)   1 Hibiclens skin cleaner   Pre-op Instructions  If you have any questions regarding the instructions, do not hesitate to call our office at any point during this process.   [place]: [address] [place], [place] [address] [phone]  [place]: [address] [place],  [address] [phone]  Dr. Shateka Petrea, DPM  

## 2022-10-30 NOTE — Telephone Encounter (Signed)
DOS 11/10/2022  AMPUTATION 2ND LT - 28820  [employer] MEDICARE EFFECTIVE DATE - 05/29/2022  PLAN DEDUCTIBLE - $250.00 W/$0.00 REMAINING OUT OF POCKET - $2250.00 W/$989.52 REMAINING  CO-INSURANCE 20% / Day OUTPATIENT SURGERY 20% / Day OUTPATIENT HOSPITAL COPAY $0 / Day OUTPATIENT SURGERY $0 / Day OUTPATIENT HOSPITAL   Notification/prior authorization number [id number] Case status Closed --- Coverage status  Overall coverage status Covered/Approved Coverage determination is reflected for the facility admission and isn't a guarantee of payment for ongoing services.  Procedure code [facility] Description [facility] SURG Coverage status Covered/Approved Decision date 10/30/2022 Procedure code 09811 Description Amputation, toe; metatarsophalangeal joint Coverage status Covered/Approved Decision date 10/30/2022

## 2022-10-31 ENCOUNTER — Telehealth: Payer: Self-pay

## 2022-10-31 NOTE — Telephone Encounter (Signed)
[name] called to reschedule her surgery with Dr. Ardelle Anton on 11/10/2022. She stated her caregiver couldn't be with her that day and had no one else to bring her. I offered her 11/01/2022 but she couldn't do that date either. I have her scheduled for 11/15/2022. She was told that if her toe started getting worse to call our office immediately. Notified Dr. Ardelle Anton and Aram Beecham at [facility].

## 2022-11-15 ENCOUNTER — Other Ambulatory Visit: Payer: Self-pay | Admitting: Podiatry

## 2022-11-15 DIAGNOSIS — M86672 Other chronic osteomyelitis, left ankle and foot: Secondary | ICD-10-CM | POA: Diagnosis not present

## 2022-11-15 MED ORDER — DOXYCYCLINE HYCLATE 100 MG PO TABS: 100.0000 mg | ORAL_TABLET | Freq: Two times a day (BID) | ORAL | 0 refills | Status: DC

## 2022-11-15 MED ORDER — HYDROCODONE-ACETAMINOPHEN 5-325 MG PO TABS: 1.0000 | ORAL_TABLET | Freq: Four times a day (QID) | ORAL | 0 refills | Status: DC | PRN

## 2022-11-15 NOTE — Progress Notes (Signed)
Postop medications sent 

## 2022-11-17 ENCOUNTER — Encounter: Payer: Medicare Other | Admitting: Podiatry

## 2022-11-20 ENCOUNTER — Encounter: Payer: Self-pay | Admitting: Podiatry

## 2022-11-20 ENCOUNTER — Ambulatory Visit (INDEPENDENT_AMBULATORY_CARE_PROVIDER_SITE_OTHER): Payer: Medicare Other | Admitting: Podiatry

## 2022-11-20 DIAGNOSIS — M86172 Other acute osteomyelitis, left ankle and foot: Secondary | ICD-10-CM

## 2022-11-22 NOTE — Progress Notes (Signed)
Subjective: Chief Complaint  Patient presents with   Routine Post Op    POV #1 DOS 11/15/2022 LT 2ND TOE AMPUTATION   "Its feeling pretty good"   89 year old female presents the office today status post left second toe amputation.  She states that she is doing well not be significant pain.  Denies any fevers or chills.  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 Incision well coapted with sutures intact.  Minimal edema.  No erythema, drainage or pus or signs of infection.  Mild ecchymosis at the lesser digits.  No open lesions otherwise. No pain with calf compression, swelling, warmth, erythema  Assessment: Status post left amputation  Plan: -All treatment options discussed with the patient including all alternatives, risks, complications.  -Since feeling well.  Antibiotic ointment was applied followed by dressing.  She can keep the dressing clean, dry, intact.  Main surgical shoe, elevation. -Monitor for any clinical signs or symptoms of infection and directed to call the office immediately should any occur or go to the ER. -Patient encouraged to call the office with any questions, concerns, change in symptoms.   Vivi Barrack DPM

## 2022-11-24 ENCOUNTER — Encounter: Payer: Medicare Other | Admitting: Podiatry

## 2022-11-28 ENCOUNTER — Encounter: Payer: Medicare Other | Admitting: Podiatry

## 2022-11-28 ENCOUNTER — Telehealth: Payer: Self-pay | Admitting: Podiatry

## 2022-11-28 NOTE — Telephone Encounter (Signed)
Pt called in stating that her foot it very itchy and keeping her up at night. Pt requesting a refill on Rx  Hydrocodone to hold her until her appointment on 12/04/22   Please advise

## 2022-11-29 ENCOUNTER — Other Ambulatory Visit: Payer: Self-pay | Admitting: Podiatry

## 2022-11-29 MED ORDER — HYDROCODONE-ACETAMINOPHEN 5-325 MG PO TABS: 1.0000 | ORAL_TABLET | Freq: Four times a day (QID) | ORAL | 0 refills | Status: DC | PRN

## 2022-11-29 NOTE — Telephone Encounter (Signed)
Pt had left message on nurse line as well yesterday at 216pm.  Upon checking chart pt had called back and the message was sent as well.  I notified pt that the medication was sent in to the pharmacy enough to get her thru to her next appt. She said thank you so much.

## 2022-12-04 ENCOUNTER — Ambulatory Visit (INDEPENDENT_AMBULATORY_CARE_PROVIDER_SITE_OTHER): Payer: Medicare Other | Admitting: Podiatry

## 2022-12-04 ENCOUNTER — Encounter: Payer: Self-pay | Admitting: Podiatry

## 2022-12-04 DIAGNOSIS — M86172 Other acute osteomyelitis, left ankle and foot: Secondary | ICD-10-CM

## 2022-12-08 ENCOUNTER — Encounter: Payer: Medicare Other | Admitting: Podiatry

## 2022-12-09 NOTE — Progress Notes (Signed)
Subjective: Chief Complaint  Patient presents with   Routine Post Op    POV #2 DOS 11/15/2022 LT 2ND TOE AMPUTATION    "Some burning, but doing okay"    89 year old female presents the office today status post left second toe amputation.  She presents for procedure removal.  She does get some burning when she takes medication as needed.  No fevers or chills.  She has no other concerns today.  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 Incision well coapted with sutures intact.  Minimal edema.  No erythema, drainage or pus or signs of infection.  Mild ecchymosis at the lesser digits with improvement.  No open lesions otherwise. No pain with calf compression, swelling, warmth, erythema  Assessment: Status post left amputation  Plan: -All treatment options discussed with the patient including all alternatives, risks, complications.  -Sutures removed today.  Incision appears to be healing well.  Small amount of antibiotic ointment was applied followed by dressing.  Discussion she can wash with soap and water, dry thoroughly and apply similar bandage.  Remain in surgical shoe for now.  Encouraged elevation of the residual edema. -Monitor for any clinical signs or symptoms of infection and directed to call the office immediately should any occur or go to the ER.  Return for 10-14 days for follow up of toe amputation .  Vivi Barrack DPM

## 2022-12-12 ENCOUNTER — Encounter: Payer: Medicare Other | Admitting: Podiatry

## 2022-12-15 ENCOUNTER — Ambulatory Visit (INDEPENDENT_AMBULATORY_CARE_PROVIDER_SITE_OTHER): Payer: Medicare Other

## 2022-12-15 ENCOUNTER — Ambulatory Visit (INDEPENDENT_AMBULATORY_CARE_PROVIDER_SITE_OTHER): Payer: Medicare Other | Admitting: Podiatry

## 2022-12-15 DIAGNOSIS — L97524 Non-pressure chronic ulcer of other part of left foot with necrosis of bone: Secondary | ICD-10-CM

## 2022-12-15 DIAGNOSIS — M86172 Other acute osteomyelitis, left ankle and foot: Secondary | ICD-10-CM

## 2022-12-15 DIAGNOSIS — Z9889 Other specified postprocedural states: Secondary | ICD-10-CM

## 2022-12-15 MED ORDER — HYDROCODONE-ACETAMINOPHEN 5-325 MG PO TABS: 1.0000 | ORAL_TABLET | Freq: Four times a day (QID) | ORAL | 0 refills | Status: DC | PRN

## 2022-12-17 NOTE — Progress Notes (Signed)
Subjective: Chief Complaint  Patient presents with   Routine Post Op    Pt states she is not having right foot pain anymore she is doing much better      89 year old female presents the office today status post left second toe amputation.  States that she is doing well.  She is not having significant pain except at nighttime.  Some burning she is asking for a refill of the pain medication to take only as needed.  No recent MRIs.  No fevers or chills.  No other concerns.   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 Incision well coapted and eschar is forming.  There is 1 small scab still present on the central aspect but overall incision appears to be healing well.  There is no change or pus.  Trace edema.  No erythema or warmth or signs of infection.  No pain with calf compression, swelling, warmth, erythema  Assessment: Status post left amputation-improving  Plan: -All treatment options discussed with the patient including all alternatives, risks, complications.  -Patient is healing well.  There is still 1 small scab and would keep the dressing on during the day.  Remain in surgical shoe until the scabs come off and the incisions completely healed.  Compression, elevation only residual edema.  Pain medication as needed we have refilled today as needed only and hopefully this will be the last refill.  -Monitor for any clinical signs or symptoms of infection and directed to call the office immediately should any occur or go to the ER.  Return in about 4 weeks (around 01/12/2023).  Vivi Barrack DPM

## 2022-12-26 ENCOUNTER — Other Ambulatory Visit: Payer: Self-pay | Admitting: Podiatry

## 2022-12-26 DIAGNOSIS — L97524 Non-pressure chronic ulcer of other part of left foot with necrosis of bone: Secondary | ICD-10-CM

## 2022-12-26 DIAGNOSIS — M86172 Other acute osteomyelitis, left ankle and foot: Secondary | ICD-10-CM

## 2023-01-12 ENCOUNTER — Ambulatory Visit: Payer: Medicare Other | Admitting: Podiatry

## 2023-01-12 DIAGNOSIS — G629 Polyneuropathy, unspecified: Secondary | ICD-10-CM | POA: Diagnosis not present

## 2023-01-12 DIAGNOSIS — M86172 Other acute osteomyelitis, left ankle and foot: Secondary | ICD-10-CM | POA: Diagnosis not present

## 2023-01-13 NOTE — Progress Notes (Signed)
Subjective: No chief complaint on file. .   89 year old female presents the office today status post left second toe amputation.  States that she is doing great not been any pain.  She is back in regular shoe.  No open lesions.  She has no concerns.  She is no longer taking the medication.     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 Incision well coapted and eschar is well-formed.  There is trace edema but there is no erythema or warmth.  There is no open lesions.  There is no pain on exam.  No other areas of discomfort.  She does describe some nerve symptoms into the other digits.  This is ongoing prior to the surgery as well.  No pain with calf compression, swelling, warmth, erythema  Assessment: Status post left amputation, healed; neuropathy  Plan: -All treatment options discussed with the patient including all alternatives, risks, complications.  -Incisions well-healed and she got from the regular shoe.  She is having no issues with this.  Will discharge her from the postoperative course and she agrees with this plan.  Monitor for any signs or symptoms of infection or any new lesions. -She takes gabapentin 100 mg at nighttime.  Discussed taking a second tablet if needed and she has done this intermittently without side effects.  Vivi Barrack DPM

## 2023-06-05 ENCOUNTER — Ambulatory Visit: Payer: Medicare Other | Admitting: Podiatry

## 2023-06-07 ENCOUNTER — Ambulatory Visit: Payer: Medicare Other | Admitting: Podiatry

## 2023-06-07 ENCOUNTER — Encounter: Payer: Self-pay | Admitting: Podiatry

## 2023-06-07 DIAGNOSIS — M79675 Pain in left toe(s): Secondary | ICD-10-CM | POA: Diagnosis not present

## 2023-06-07 DIAGNOSIS — M79674 Pain in right toe(s): Secondary | ICD-10-CM

## 2023-06-07 DIAGNOSIS — Z89422 Acquired absence of other left toe(s): Secondary | ICD-10-CM | POA: Diagnosis not present

## 2023-06-07 DIAGNOSIS — B351 Tinea unguium: Secondary | ICD-10-CM | POA: Diagnosis not present

## 2023-06-07 NOTE — Progress Notes (Signed)
 Subjective: Chief Complaint  Patient presents with   Foot Pain    RM#13 Patient states is getting better just wanted to have her left foot looked at has a few toe nails need trimming.     89 year old female presents the office today for concerns of thick, elongated nails that she is unable to trim them as they become ingrown and they cause discomfort.  She did amputations been doing well.  She gets occasional nerve symptoms.  No open lesions or any other concerns today.  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  There is well-healed the scar is formed. Nails are hypertrophic, dystrophic and brittle.  There is mild yellow discoloration of the nails.  There is incurvation present to the hallux toenails left side worse than the right.  There is no drainage or pus or signs of infection today. No pain with calf compression, swelling, warmth, erythema  Assessment: Symptomatic onychomycosis, history of amputation  Plan: -All treatment options discussed with the patient including all alternatives, risks, complications.  -Indications standpoint she has been doing well.  There is no open lesions but continue to monitor. -Separate debrided the nails x 9 without any complications or bleeding.  In particular I was able debride this without portions of the ingrown toenail to any complications or bleeding.  Monitor for any signs or symptoms of infection. -Continue gabapentin  for neuropathy.  Donnice JONELLE Fees DPM

## 2023-08-09 ENCOUNTER — Ambulatory Visit: Payer: Medicare Other | Admitting: Podiatry

## 2023-08-09 ENCOUNTER — Encounter: Payer: Self-pay | Admitting: Podiatry

## 2023-08-09 DIAGNOSIS — M79674 Pain in right toe(s): Secondary | ICD-10-CM | POA: Diagnosis not present

## 2023-08-09 DIAGNOSIS — B351 Tinea unguium: Secondary | ICD-10-CM | POA: Diagnosis not present

## 2023-08-09 DIAGNOSIS — M79675 Pain in left toe(s): Secondary | ICD-10-CM | POA: Diagnosis not present

## 2023-08-11 NOTE — Progress Notes (Signed)
 Subjective: Chief Complaint  Patient presents with   RFC    RM#97 RFC     89 year old female presents the office today for concerns of thick, elongated nails that she is unable to trim them as they become ingrown and they cause discomfort.  She did amputations been doing well.  No pain associate with this.  No open lesions or other concerns other than her toes well been asking for toe spacers.    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  There is well-healed the scar is formed. Nails are hypertrophic, dystrophic and brittle.  There is mild yellow discoloration of the nails.  There is incurvation present to the hallux toenails left side worse than the right.  She gets tenderness nails 1-5 bilaterally.  There is no drainage or pus or signs of infection today. Digital contractures present. No pain with calf compression, swelling, warmth, erythema  Assessment: Symptomatic onychomycosis, history of amputation  Plan: -All treatment options discussed with the patient including all alternatives, risks, complications.  -Separate debrided the nails x 9 without any complications or bleeding.   -Continue gabapentin for neuropathy. -Dispensed toe spacers.  Vivi Barrack DPM

## 2023-09-24 ENCOUNTER — Other Ambulatory Visit: Payer: Self-pay

## 2023-09-24 ENCOUNTER — Encounter (HOSPITAL_COMMUNITY): Payer: Self-pay | Admitting: Emergency Medicine

## 2023-09-24 ENCOUNTER — Inpatient Hospital Stay (HOSPITAL_COMMUNITY)
Admission: EM | Admit: 2023-09-24 | Discharge: 2023-09-27 | DRG: 871 | Disposition: A | Discharge status: Home Health | Attending: Internal Medicine | Admitting: Internal Medicine

## 2023-09-24 ENCOUNTER — Emergency Department (HOSPITAL_COMMUNITY)

## 2023-09-24 DIAGNOSIS — J9601 Acute respiratory failure with hypoxia: Secondary | ICD-10-CM | POA: Diagnosis present

## 2023-09-24 DIAGNOSIS — Z90710 Acquired absence of both cervix and uterus: Secondary | ICD-10-CM

## 2023-09-24 DIAGNOSIS — Z66 Do not resuscitate: Secondary | ICD-10-CM | POA: Diagnosis present

## 2023-09-24 DIAGNOSIS — R636 Underweight: Secondary | ICD-10-CM | POA: Diagnosis present

## 2023-09-24 DIAGNOSIS — Z79899 Other long term (current) drug therapy: Secondary | ICD-10-CM

## 2023-09-24 DIAGNOSIS — Z923 Personal history of irradiation: Secondary | ICD-10-CM

## 2023-09-24 DIAGNOSIS — E871 Hypo-osmolality and hyponatremia: Secondary | ICD-10-CM | POA: Diagnosis present

## 2023-09-24 DIAGNOSIS — M810 Age-related osteoporosis without current pathological fracture: Secondary | ICD-10-CM | POA: Diagnosis present

## 2023-09-24 DIAGNOSIS — R5381 Other malaise: Secondary | ICD-10-CM | POA: Diagnosis present

## 2023-09-24 DIAGNOSIS — I10 Essential (primary) hypertension: Secondary | ICD-10-CM | POA: Diagnosis present

## 2023-09-24 DIAGNOSIS — E869 Volume depletion, unspecified: Secondary | ICD-10-CM | POA: Diagnosis present

## 2023-09-24 DIAGNOSIS — Z9012 Acquired absence of left breast and nipple: Secondary | ICD-10-CM

## 2023-09-24 DIAGNOSIS — K219 Gastro-esophageal reflux disease without esophagitis: Secondary | ICD-10-CM | POA: Diagnosis present

## 2023-09-24 DIAGNOSIS — R6520 Severe sepsis without septic shock: Secondary | ICD-10-CM | POA: Diagnosis present

## 2023-09-24 DIAGNOSIS — Z1152 Encounter for screening for COVID-19: Secondary | ICD-10-CM | POA: Diagnosis not present

## 2023-09-24 DIAGNOSIS — Z853 Personal history of malignant neoplasm of breast: Secondary | ICD-10-CM | POA: Diagnosis not present

## 2023-09-24 DIAGNOSIS — Z888 Allergy status to other drugs, medicaments and biological substances status: Secondary | ICD-10-CM

## 2023-09-24 DIAGNOSIS — Z8249 Family history of ischemic heart disease and other diseases of the circulatory system: Secondary | ICD-10-CM | POA: Diagnosis not present

## 2023-09-24 DIAGNOSIS — I700 Atherosclerosis of aorta: Secondary | ICD-10-CM | POA: Diagnosis present

## 2023-09-24 DIAGNOSIS — R7303 Prediabetes: Secondary | ICD-10-CM | POA: Diagnosis present

## 2023-09-24 DIAGNOSIS — E7800 Pure hypercholesterolemia, unspecified: Secondary | ICD-10-CM | POA: Diagnosis present

## 2023-09-24 DIAGNOSIS — Z887 Allergy status to serum and vaccine status: Secondary | ICD-10-CM

## 2023-09-24 DIAGNOSIS — J45909 Unspecified asthma, uncomplicated: Secondary | ICD-10-CM | POA: Diagnosis present

## 2023-09-24 DIAGNOSIS — Z681 Body mass index (BMI) 19 or less, adult: Secondary | ICD-10-CM

## 2023-09-24 DIAGNOSIS — R630 Anorexia: Secondary | ICD-10-CM | POA: Diagnosis present

## 2023-09-24 DIAGNOSIS — E872 Acidosis, unspecified: Secondary | ICD-10-CM | POA: Insufficient documentation

## 2023-09-24 DIAGNOSIS — A419 Sepsis, unspecified organism: Principal | ICD-10-CM | POA: Diagnosis present

## 2023-09-24 DIAGNOSIS — I5032 Chronic diastolic (congestive) heart failure: Secondary | ICD-10-CM | POA: Diagnosis present

## 2023-09-24 DIAGNOSIS — I110 Hypertensive heart disease with heart failure: Secondary | ICD-10-CM | POA: Diagnosis present

## 2023-09-24 DIAGNOSIS — J189 Pneumonia, unspecified organism: Secondary | ICD-10-CM | POA: Diagnosis present

## 2023-09-24 DIAGNOSIS — R531 Weakness: Secondary | ICD-10-CM | POA: Diagnosis present

## 2023-09-24 DIAGNOSIS — Z7982 Long term (current) use of aspirin: Secondary | ICD-10-CM

## 2023-09-24 DIAGNOSIS — R739 Hyperglycemia, unspecified: Secondary | ICD-10-CM | POA: Diagnosis present

## 2023-09-24 LAB — I-STAT CHEM 8, ED
BUN: 19 mg/dL (ref 8–23)
Calcium, Ion: 1.12 mmol/L — ABNORMAL LOW (ref 1.15–1.40)
Chloride: 94 mmol/L — ABNORMAL LOW (ref 98–111)
Creatinine, Ser: 1 mg/dL (ref 0.44–1.00)
Glucose, Bld: 243 mg/dL — ABNORMAL HIGH (ref 70–99)
HCT: 43 % (ref 36.0–46.0)
Hemoglobin: 14.6 g/dL (ref 12.0–15.0)
Potassium: 4.2 mmol/L (ref 3.5–5.1)
Sodium: 125 mmol/L — ABNORMAL LOW (ref 135–145)
TCO2: 19 mmol/L — ABNORMAL LOW (ref 22–32)

## 2023-09-24 LAB — URINALYSIS, W/ REFLEX TO CULTURE (INFECTION SUSPECTED)
Bilirubin Urine: NEGATIVE
Glucose, UA: 50 mg/dL — AB
Hgb urine dipstick: NEGATIVE
Ketones, ur: 5 mg/dL — AB
Leukocytes,Ua: NEGATIVE
Nitrite: NEGATIVE
Protein, ur: NEGATIVE mg/dL
Specific Gravity, Urine: 1.01 (ref 1.005–1.030)
pH: 6 (ref 5.0–8.0)

## 2023-09-24 LAB — COMPREHENSIVE METABOLIC PANEL WITH GFR
ALT: 20 U/L (ref 0–44)
AST: 18 U/L (ref 15–41)
Albumin: 3.1 g/dL — ABNORMAL LOW (ref 3.5–5.0)
Alkaline Phosphatase: 75 U/L (ref 38–126)
Anion gap: 13 (ref 5–15)
BUN: 21 mg/dL (ref 8–23)
CO2: 20 mmol/L — ABNORMAL LOW (ref 22–32)
Calcium: 8.6 mg/dL — ABNORMAL LOW (ref 8.9–10.3)
Chloride: 91 mmol/L — ABNORMAL LOW (ref 98–111)
Creatinine, Ser: 1.01 mg/dL — ABNORMAL HIGH (ref 0.44–1.00)
GFR, Estimated: 52 mL/min — ABNORMAL LOW
Glucose, Bld: 252 mg/dL — ABNORMAL HIGH (ref 70–99)
Potassium: 4 mmol/L (ref 3.5–5.1)
Sodium: 124 mmol/L — ABNORMAL LOW (ref 135–145)
Total Bilirubin: 0.7 mg/dL (ref 0.0–1.2)
Total Protein: 6.8 g/dL (ref 6.5–8.1)

## 2023-09-24 LAB — RESP PANEL BY RT-PCR (RSV, FLU A&B, COVID)  RVPGX2
Influenza A by PCR: NEGATIVE
Influenza B by PCR: NEGATIVE
Resp Syncytial Virus by PCR: NEGATIVE
SARS Coronavirus 2 by RT PCR: NEGATIVE

## 2023-09-24 LAB — BLOOD GAS, VENOUS
Acid-base deficit: 1.7 mmol/L (ref 0.0–2.0)
Bicarbonate: 22.1 mmol/L (ref 20.0–28.0)
O2 Saturation: 43.6 %
Patient temperature: 37
pCO2, Ven: 34 mmHg — ABNORMAL LOW (ref 44–60)
pH, Ven: 7.42 (ref 7.25–7.43)
pO2, Ven: <31 mmHg — CL (ref 32–45)

## 2023-09-24 LAB — CBC WITH DIFFERENTIAL/PLATELET
Abs Immature Granulocytes: 0.09 10*3/uL — ABNORMAL HIGH (ref 0.00–0.07)
Basophils Absolute: 0 10*3/uL (ref 0.0–0.1)
Basophils Relative: 0 %
Eosinophils Absolute: 0 10*3/uL (ref 0.0–0.5)
Eosinophils Relative: 0 %
HCT: 38.9 % (ref 36.0–46.0)
Hemoglobin: 13.4 g/dL (ref 12.0–15.0)
Immature Granulocytes: 1 %
Lymphocytes Relative: 9 %
Lymphs Abs: 1.5 10*3/uL (ref 0.7–4.0)
MCH: 30.5 pg (ref 26.0–34.0)
MCHC: 34.4 g/dL (ref 30.0–36.0)
MCV: 88.4 fL (ref 80.0–100.0)
Monocytes Absolute: 1.1 10*3/uL — ABNORMAL HIGH (ref 0.1–1.0)
Monocytes Relative: 7 %
Neutro Abs: 12.8 10*3/uL — ABNORMAL HIGH (ref 1.7–7.7)
Neutrophils Relative %: 83 %
Platelets: 249 10*3/uL (ref 150–400)
RBC: 4.4 MIL/uL (ref 3.87–5.11)
RDW: 13.4 % (ref 11.5–15.5)
WBC Morphology: INCREASED
WBC: 15.5 10*3/uL — ABNORMAL HIGH (ref 4.0–10.5)
nRBC: 0 % (ref 0.0–0.2)

## 2023-09-24 LAB — BASIC METABOLIC PANEL WITH GFR
Anion gap: 10 (ref 5–15)
BUN: 15 mg/dL (ref 8–23)
CO2: 17 mmol/L — ABNORMAL LOW (ref 22–32)
Calcium: 7.9 mg/dL — ABNORMAL LOW (ref 8.9–10.3)
Chloride: 104 mmol/L (ref 98–111)
Creatinine, Ser: 0.65 mg/dL (ref 0.44–1.00)
GFR, Estimated: >60 mL/min (ref >60)
Glucose, Bld: 376 mg/dL — ABNORMAL HIGH (ref 70–99)
Potassium: 3.9 mmol/L (ref 3.5–5.1)
Sodium: 131 mmol/L — ABNORMAL LOW (ref 135–145)

## 2023-09-24 LAB — BRAIN NATRIURETIC PEPTIDE: B Natriuretic Peptide: 348.7 pg/mL — ABNORMAL HIGH (ref 0.0–100.0)

## 2023-09-24 LAB — I-STAT CG4 LACTIC ACID, ED
Lactic Acid, Venous: 3.1 mmol/L (ref 0.5–1.9)
Lactic Acid, Venous: 1.7 mmol/L (ref 0.5–1.9)

## 2023-09-24 LAB — PROTIME-INR
INR: 1 (ref 0.8–1.2)
Prothrombin Time: 13.7 s (ref 11.4–15.2)

## 2023-09-24 LAB — MAGNESIUM: Magnesium: 1.7 mg/dL (ref 1.7–2.4)

## 2023-09-24 MED ORDER — SODIUM CHLORIDE 0.9 % IV SOLN: INTRAVENOUS | Status: AC

## 2023-09-24 MED ORDER — ONDANSETRON HCL 4 MG PO TABS: 4.0000 mg | ORAL_TABLET | Freq: Four times a day (QID) | ORAL | Status: DC | PRN

## 2023-09-24 MED ORDER — SODIUM CHLORIDE 0.9 % IV SOLN
1.0000 g | INTRAVENOUS | Status: DC
  Administered 2023-09-25 – 2023-09-27 (×3): 1 g via INTRAVENOUS
  Filled 2023-09-24 (×3): qty 10

## 2023-09-24 MED ORDER — HEPARIN SODIUM (PORCINE) 5000 UNIT/ML IJ SOLN
5000.0000 [IU] | Freq: Three times a day (TID) | INTRAMUSCULAR | Status: DC
  Administered 2023-09-24 – 2023-09-27 (×8): 5000 [IU] via SUBCUTANEOUS
  Filled 2023-09-24 (×9): qty 1

## 2023-09-24 MED ORDER — ONDANSETRON HCL 4 MG/2ML IJ SOLN: 4.0000 mg | Freq: Four times a day (QID) | INTRAMUSCULAR | Status: DC | PRN

## 2023-09-24 MED ORDER — ALPRAZOLAM 0.25 MG PO TABS
0.2500 mg | ORAL_TABLET | Freq: Every evening | ORAL | Status: DC | PRN
Start: 2023-09-24 — End: 2023-09-27
  Administered 2023-09-24 – 2023-09-26 (×3): 0.25 mg via ORAL
  Filled 2023-09-24 (×4): qty 1

## 2023-09-24 MED ORDER — SODIUM CHLORIDE 0.9 % IV SOLN
1.0000 g | Freq: Once | INTRAVENOUS | Status: AC
  Administered 2023-09-24: 1 g via INTRAVENOUS
  Filled 2023-09-24: qty 10

## 2023-09-24 MED ORDER — METOPROLOL SUCCINATE ER 25 MG PO TB24
12.5000 mg | ORAL_TABLET | Freq: Every day | ORAL | Status: DC
  Administered 2023-09-25 – 2023-09-27 (×3): 12.5 mg via ORAL
  Filled 2023-09-24 (×3): qty 1

## 2023-09-24 MED ORDER — METOPROLOL TARTRATE 25 MG PO TABS
12.5000 mg | ORAL_TABLET | Freq: Once | ORAL | Status: AC
  Administered 2023-09-24: 12.5 mg via ORAL
  Filled 2023-09-24: qty 1

## 2023-09-24 MED ORDER — ALBUTEROL SULFATE (2.5 MG/3ML) 0.083% IN NEBU: 2.5000 mg | INHALATION_SOLUTION | RESPIRATORY_TRACT | Status: DC | PRN

## 2023-09-24 MED ORDER — GABAPENTIN 100 MG PO CAPS
100.0000 mg | ORAL_CAPSULE | Freq: Once | ORAL | Status: AC
  Administered 2023-09-24: 100 mg via ORAL
  Filled 2023-09-24: qty 1

## 2023-09-24 MED ORDER — ACETAMINOPHEN 650 MG RE SUPP: 650.0000 mg | Freq: Four times a day (QID) | RECTAL | Status: DC | PRN

## 2023-09-24 MED ORDER — PANTOPRAZOLE SODIUM 40 MG PO TBEC
40.0000 mg | DELAYED_RELEASE_TABLET | Freq: Every day | ORAL | Status: DC
  Administered 2023-09-24 – 2023-09-27 (×4): 40 mg via ORAL
  Filled 2023-09-24 (×4): qty 1

## 2023-09-24 MED ORDER — SODIUM CHLORIDE 0.9 % IV BOLUS
500.0000 mL | Freq: Once | INTRAVENOUS | Status: AC
  Administered 2023-09-24: 500 mL via INTRAVENOUS

## 2023-09-24 MED ORDER — SODIUM CHLORIDE 0.9 % IV BOLUS
500.0000 mL | Freq: Once | INTRAVENOUS | Status: AC
Start: 2023-09-24 — End: 2023-09-24
  Administered 2023-09-24: 500 mL via INTRAVENOUS

## 2023-09-24 MED ORDER — SODIUM CHLORIDE 0.9 % IV SOLN
500.0000 mg | INTRAVENOUS | Status: DC
  Administered 2023-09-25 – 2023-09-27 (×3): 500 mg via INTRAVENOUS
  Filled 2023-09-24 (×3): qty 5

## 2023-09-24 MED ORDER — SODIUM CHLORIDE 0.9 % IV SOLN
500.0000 mg | Freq: Once | INTRAVENOUS | Status: AC
  Administered 2023-09-24: 500 mg via INTRAVENOUS
  Filled 2023-09-24: qty 5

## 2023-09-24 MED ORDER — ACETAMINOPHEN 325 MG PO TABS: 650.0000 mg | ORAL_TABLET | Freq: Four times a day (QID) | ORAL | Status: DC | PRN

## 2023-09-24 MED ORDER — PANTOPRAZOLE SODIUM 40 MG PO TBEC: 40.0000 mg | DELAYED_RELEASE_TABLET | Freq: Every day | ORAL | Status: DC

## 2023-09-24 NOTE — H&P (Addendum)
 History and Physical  [name] QIO:[id number] DOB: [birth year] DOA: 09/24/2023  PCP: Aldo Hun, MD   Chief Complaint: Weakness and cough  HPI: [name] is a 89 y.o. female with medical history significant for hypertension, hyperlipidemia, prediabetes diet-controlled, GERD who lives independently and is being admitted to the hospital with sepsis due to community-acquired pneumonia.  She has had a cough productive of brown sputum for the last couple of weeks.  She denies any fevers or chills, she called EMS today due to continued weakness and sputum production, she was noted to be 90% on room air.  She was given DuoNeb breathing treatment and IV Solu-Medrol  125 mg by EMS.  She denies any chest pain, did not take any of her scheduled medications this morning.  Review of Systems: Please see HPI for pertinent positives and negatives. A complete 10 system review of systems are otherwise negative.  Past Medical History:  Diagnosis Date   Atherosclerosis of aorta (HCC)    Breast cancer (HCC) 1986   left ,s/p radiation   Diverticulosis    GERD (gastroesophageal reflux disease)    Hiatal hernia    Hypercholesteremia    Hypertension    Osteopenia    Osteoporosis    Past Surgical History:  Procedure Laterality Date   ABDOMINAL AORTOGRAM W/LOWER EXTREMITY N/A 08/08/2022   Procedure: ABDOMINAL AORTOGRAM W/LOWER EXTREMITY;  Surgeon: Margherita Shell, MD;  Location: [facility];  Service: Cardiovascular;  Laterality: N/A;   ABDOMINAL HYSTERECTOMY     CATARACT EXTRACTION Bilateral    IR RADIOLOGIST EVAL & MGMT  01/03/2018   IR RADIOLOGIST EVAL & MGMT  01/31/2018   IR RADIOLOGIST EVAL & MGMT  02/27/2018   IR RADIOLOGIST EVAL & MGMT  03/20/2018   IR RADIOLOGIST EVAL & MGMT  04/03/2018   MASTECTOMY  05/29/1997   LEFT   TONSILLECTOMY     age 86   Social History:  reports that she has never smoked. She has never used smokeless tobacco. She reports that she does not currently use  alcohol . She reports that she does not use drugs.  Allergies  Allergen Reactions   Cellcept  [Mycophenolate ] Other (See Comments)    dizziness, worsening weakness, depressed, just "did not feel right".   Covid-19 (Mrna Bivalent) Vaccine Proofreader) [Covid-19 (Mrna) Vaccine]     Partial paralysis     Family History  Problem Relation Age of Onset   Hypertension Mother    ALS Father    Arthritis Father    Stroke Father      Prior to Admission medications   Medication Sig Start Date End Date Taking? Authorizing Provider  ALPRAZolam  (XANAX ) 0.25 MG tablet Take 0.25 mg by mouth at bedtime.    [provider]  amLODipine  (NORVASC ) 5 MG tablet Take 0.5 tablets (2.5 mg total) by mouth daily. Patient taking differently: Take 5 mg by mouth daily. 12/06/18   Johnson, Clanford L, MD  Ascorbic Acid  (VITAMIN C ) 1000 MG tablet Take 1,000 mg by mouth daily.    [provider]  aspirin  325 MG EC tablet Take 325 mg by mouth daily.    [provider]  calcium  carbonate (TUMS EX) 750 MG chewable tablet Chew 1 tablet by mouth at bedtime.    [provider]  Cholecalciferol  (VITAMIN D ) 1000 UNITS capsule Take 1,000 Units by mouth daily.      [provider]  Coenzyme Q10 (CO Q-10) 200 MG CAPS Take 1 capsule by mouth daily.  [provider]  Cyanocobalamin  (B-12) 500 MCG SUBL Place 500 mcg under the tongue daily.    [provider]  diclofenac  Sodium (VOLTAREN ) 1 % GEL Apply 2 g topically daily as needed. Patient taking differently: Apply 2 g topically at bedtime as needed (pain). 05/05/22   Charity Conch, DPM  doxycycline  (VIBRA -TABS) 100 MG tablet Take 1 tablet (100 mg total) by mouth 2 (two) times daily. Patient not taking: Reported on 08/09/2023 11/15/22   Charity Conch, DPM  ezetimibe  (ZETIA ) 10 MG tablet Take 10 mg by mouth daily. 02/26/16   [provider]  gabapentin  (NEURONTIN ) 100 MG capsule Take 1 capsule (100 mg total)  by mouth at bedtime. 04/18/22   Phebe Brasil, MD  HYDROcodone -acetaminophen  (NORCO/VICODIN) 5-325 MG tablet Take 1 tablet by mouth every 6 (six) hours as needed. Patient not taking: Reported on 08/09/2023 12/15/22   Charity Conch, DPM  ibuprofen  (ADVIL ) 200 MG tablet Take 200 mg by mouth every 6 (six) hours as needed for moderate pain.    [provider]  metoprolol  succinate (TOPROL -XL) 25 MG 24 hr tablet TAKE 1/2 TABLET BY MOUTH DAILY Patient taking differently: Take 12.5 mg by mouth daily. 10/01/17   Nahser, Lela Purple, MD  Misc Natural Products (GLUCOSAMINE CHONDROITIN ADV PO) Take 1 tablet by mouth daily.    [provider]  Multiple Vitamin (MULTIVITAMIN) tablet Take 1 tablet by mouth daily.      [provider]  Multiple Vitamins-Minerals (ICAPS AREDS 2 PO) Take 1 capsule by mouth daily.    [provider]  mupirocin  ointment (BACTROBAN ) 2 % Apply 1 Application topically 2 (two) times daily. 10/13/22   Charity Conch, DPM  MYRBETRIQ  50 MG TB24 tablet Take 25 mg by mouth daily as needed (bladder control).    [provider]  neomycin-bacitracin-polymyxin (NEOSPORIN) OINT Apply 1 Application topically as needed for wound care.    [provider]  omeprazole  (PRILOSEC) 20 MG capsule Take 1 capsule (20 mg total) by mouth daily. 05/27/15   Driscilla George, MD  PREVIDENT 5000 BOOSTER PLUS 1.1 % PSTE Place 1 Application onto teeth 2 (two) times daily. 03/03/21   [provider]  Probiotic Product (PHILLIPS COLON HEALTH) CAPS Take 1 capsule by mouth daily.    [provider]  raloxifene  (EVISTA ) 60 MG tablet TAKE 1 TABLET BY MOUTH ONCE DAILY 12/23/14   Driscilla George, MD    Physical Exam: BP 114/64 (BP Location: Right Arm)   Pulse (!) 129   Temp 98.2 F (36.8 C) (Oral)   Resp (!) 24   Ht 4\' 10"  (1.473 m)   Wt 39.9 kg   SpO2 97%   BMI 18.39 kg/m  General:  Alert, oriented, calm, in no acute distress, wearing 2 L  nasal cannula oxygen.  Thin elderly female appearing her stated age.  Her close family friend POA is at the bedside. Eyes: EOMI, clear conjuctivae, white sclerea Neck: supple, no masses, trachea mildline  Cardiovascular: RRR, no murmurs or rubs, no peripheral edema  Respiratory: clear to auscultation bilaterally, breath sounds diminished at the right base, no wheezes, no crackles  Abdomen: soft, nontender, nondistended, normal bowel tones heard  Skin: dry, no rashes  Musculoskeletal: no joint effusions, normal range of motion  Psychiatric: appropriate affect, normal speech  Neurologic: extraocular muscles intact, clear speech, moving all extremities with intact sensorium         Labs on Admission:  Basic Metabolic Panel: Recent Labs  Lab 09/24/23 1140 09/24/23 1159  NA 124* 125*  K 4.0 4.2  CL 91* 94*  CO2 20*  --   GLUCOSE 252* 243*  BUN 21 19  CREATININE 1.01* 1.00  CALCIUM  8.6*  --   MG 1.7  --    Liver Function Tests: Recent Labs  Lab 09/24/23 1140  AST 18  ALT 20  ALKPHOS 75  BILITOT 0.7  PROT 6.8  ALBUMIN 3.1*   No results for input(s): "LIPASE", "AMYLASE" in the last 168 hours. No results for input(s): "AMMONIA" in the last 168 hours. CBC: Recent Labs  Lab 09/24/23 1140 09/24/23 1159  WBC 15.5*  --   NEUTROABS 12.8*  --   HGB 13.4 14.6  HCT 38.9 43.0  MCV 88.4  --   PLT 249  --    Cardiac Enzymes: No results for input(s): "CKTOTAL", "CKMB", "CKMBINDEX", "TROPONINI" in the last 168 hours. BNP (last 3 results) Recent Labs    09/24/23 1140  BNP 348.7*    ProBNP (last 3 results) No results for input(s): "PROBNP" in the last 8760 hours.  CBG: No results for input(s): "GLUCAP" in the last 168 hours.  Radiological Exams on Admission: DG Chest Port 1 View Result Date: 09/24/2023 CLINICAL DATA:  Cough EXAM: PORTABLE CHEST 1 VIEW COMPARISON:  07/05/2021 FINDINGS: Heart and mediastinal contours within normal limits. Moderate-sized hiatal hernia. Left  lung clear. Airspace opacity in the right lower lobe concerning for pneumonia. No effusions or acute bony abnormality. IMPRESSION: Right lower lobe airspace opacity concerning for pneumonia. Hiatal hernia. Electronically Signed   By: Janeece Mechanic M.D.   On: 09/24/2023 13:17   Assessment/Plan Latausha A Godwin is a 89 y.o. female with medical history significant for hypertension, hyperlipidemia, prediabetes diet-controlled, GERD who lives independently and is being admitted to the hospital with sepsis due to community-acquired pneumonia.   Severe sepsis-meeting criteria with tachycardia, tachypnea, leukocytosis.  Endorgan dysfunction with lactate 3.1.  Source is community-acquired pneumonia. -Inpatient admission to progressive -Empiric IV azithromycin and IV Rocephin -Follow-up blood cultures -Hydrate with normal saline, and trend lactate  Hyponatremia--I suspect this is mainly a hypovolemic hyponatremia given that she has been progressively weaker especially over the last 7 days and has not been eating or drinking very much.  Could also be component of SIADH given her pulmonary infection. -Hydrate with normal saline at 100 cc/h, will recheck sodium level later this evening and again in the morning  Sinus tachycardia-without chest pain, or acute EKG changes.  Sinus tachycardia on monitor.  I suspect this is due to a combination of her sepsis, as well as not taking her oral metoprolol  this morning. -Will give short acting metoprolol  12.5 mg p.o. x 1 now, with Toprol -XL to resume tomorrow morning -Hydrate for her sepsis as above  Diastolic dysfunction, with elevated BNP-I suspect this is due to her tachycardia, she does have a history of grade 2 diastolic dysfunction, but currently appears dry on exam.  Will need to monitor closely for evidence of volume overload with hydration.  Hypertension-continue home amlodipine  once home medications are reconciled  GERD-oral Protonix   DVT prophylaxis:  Subcutaneous heparin    Code Status: Do not attempt resuscitation (DNR) PRE-ARREST INTERVENTIONS DESIRED  Consults called: None  Admission status: The appropriate patient status for this patient is INPATIENT. Inpatient status is judged to be reasonable and necessary in order to provide the required intensity of service to ensure the patient's safety. The patient's presenting symptoms, physical exam findings, and initial radiographic  and laboratory data in the context of their chronic comorbidities is felt to place them at high risk for further clinical deterioration. Furthermore, it is not anticipated that the patient will be medically stable for discharge from the hospital within 2 midnights of admission.    I certify that at the [name] of admission it is my clinical judgment that the patient will require inpatient hospital care spanning beyond 2 midnights from the [name] of admission due to high intensity of service, high risk for further deterioration and high frequency of surveillance required  Time spent: 49 minutes  Lio Wehrly Rickey Charm MD [facility] Pager [phone]  If 7PM-7AM, please contact night-coverage [email or web address] Password [facility]  09/24/2023, 2:39 PM

## 2023-09-24 NOTE — Plan of Care (Signed)
  Problem: Education: Goal: Knowledge of General Education information will improve Description: Including pain rating scale, medication(s)/side effects and non-pharmacologic comfort measures Outcome: Progressing   Problem: Clinical Measurements: Goal: Ability to maintain clinical measurements within normal limits will improve Outcome: Progressing   Problem: Safety: Goal: Ability to remain free from injury will improve Outcome: Progressing   

## 2023-09-24 NOTE — ED Notes (Signed)
 ED TO INPATIENT HANDOFF REPORT  Name/Age/Gender [name] 89 y.o. female  Code Status    Code Status Orders  (From admission, onward)           Start     Ordered   09/24/23 1439  Do not attempt resuscitation (DNR) Pre-Arrest Interventions Desired  Continuous       Question Answer Comment  If pulseless and not breathing No CPR or chest compressions.   In Pre-Arrest Conditions (Patient Has Pulse and Is Breathing) May intubate, use advanced airway interventions and cardioversion/ACLS medications if appropriate or indicated. May transfer to ICU.   Consent: Discussion documented in EHR or advanced directives reviewed      09/24/23 1439           Code Status History     Date Active Date Inactive Code Status Order ID Comments User Context   08/08/2022 1326 08/09/2022 1518 Full Code [id number]  Margherita Shell, MD Inpatient   07/05/2021 1735 07/12/2021 2312 DNR [id number]  Freda Jacobson, MD ED   07/05/2021 1721 07/05/2021 1734 Full Code [id number]  Freda Jacobson, MD ED   12/02/2018 0743 12/06/2018 1927 Full Code [id number]  [place], Eric J, DO Inpatient   12/19/2017 2031 12/28/2017 1828 Full Code [id number]  Fidencio Hue, MD ED      Advance Directive Documentation    Flowsheet Row Most Recent Value  Type of Advance Directive Healthcare Power of [place], Living will  [place] [employer] HPO]  Pre-existing out of facility DNR order (yellow form or pink MOST form) --  "MOST" Form in Place? --       Home/SNF/Other Home  Chief Complaint CAP (community acquired pneumonia) [J18.9]  Level of Care/Admitting Diagnosis ED Disposition     ED Disposition  Admit   Condition  --   Comment  Hospital Area: [facility] [[id number]]  Level of Care: Progressive [102]  Admit to Progressive based on following criteria: MULTISYSTEM THREATS such as stable sepsis, metabolic/electrolyte imbalance with or without encephalopathy that is responding to early treatment.  May admit patient to  [facility] or [facility] if equivalent level of care is available:: Yes  Covid Evaluation: Asymptomatic - no recent exposure (last 10 days) testing not required  Diagnosis: CAP (community acquired pneumonia) [244010]  Admitting Physician: Gaylin Ke [[id number]]  Attending Physician: Jannette Mend, MIR [place] [[id number]]  Certification:: I certify this patient will need inpatient services for at least 2 midnights  Expected Medical Readiness: 09/26/2023          Medical History Past Medical History:  Diagnosis Date   Atherosclerosis of aorta (HCC)    Breast cancer (HCC) 1986   left ,s/p radiation   Diverticulosis    GERD (gastroesophageal reflux disease)    Hiatal hernia    Hypercholesteremia    Hypertension    Osteopenia    Osteoporosis     Allergies Allergies  Allergen Reactions   Cellcept  [Mycophenolate ] Other (See Comments)    dizziness, worsening weakness, depressed, just "did not feel right".   Covid-19 (Mrna Bivalent) Vaccine Proofreader) [Covid-19 (Mrna) Vaccine]     Partial paralysis     IV Location/Drains/Wounds Patient Lines/Drains/Airways Status     Active Line/Drains/Airways     Name Placement date Placement time Site Days   Peripheral IV 09/24/23 18 G Right Antecubital 09/24/23  --  Antecubital  less than 1   Peripheral IV 09/24/23 20 G Anterior;Right Wrist 09/24/23  1306  Wrist  less than 1  Labs/Imaging Results for orders placed or performed during the hospital encounter of 09/24/23 (from the past 48 hours)  Comprehensive metabolic panel     Status: Abnormal   Collection Time: 09/24/23 11:40 AM  Result Value Ref Range   Sodium 124 (L) 135 - 145 mmol/L   Potassium 4.0 3.5 - 5.1 mmol/L   Chloride 91 (L) 98 - 111 mmol/L   CO2 20 (L) 22 - 32 mmol/L   Glucose, Bld 252 (H) 70 - 99 mg/dL    Comment: Glucose reference range applies only to samples taken after fasting for at least 8 hours.   BUN 21 8 - 23 mg/dL   Creatinine, Ser 1.61 (H) 0.44 -  1.00 mg/dL   Calcium  8.6 (L) 8.9 - 10.3 mg/dL   Total Protein 6.8 6.5 - 8.1 g/dL   Albumin 3.1 (L) 3.5 - 5.0 g/dL   AST 18 15 - 41 U/L   ALT 20 0 - 44 U/L   Alkaline Phosphatase 75 38 - 126 U/L   Total Bilirubin 0.7 0.0 - 1.2 mg/dL   GFR, Estimated 52 (L) >60 mL/min    Comment: (NOTE) Calculated using the CKD-EPI Creatinine Equation (2021)    Anion gap 13 5 - 15    Comment: Performed at [facility], [address] [place], [place] [address]  Brain natriuretic peptide     Status: Abnormal   Collection Time: 09/24/23 11:40 AM  Result Value Ref Range   B Natriuretic Peptide 348.7 (H) 0.0 - 100.0 pg/mL    Comment: Performed at [facility], [address] [place], [place] [address]  Blood gas, venous     Status: Abnormal   Collection Time: 09/24/23 11:40 AM  Result Value Ref Range   pH, Ven 7.42 7.25 - 7.43   pCO2, Ven 34 (L) 44 - 60 mmHg   pO2, Ven <31 (LL) 32 - 45 mmHg    Comment: CRITICAL RESULT CALLED TO, READ BACK BY AND VERIFIED WITH: Paralee Pendergrass, A RN AT 1217 ON 09/24/2023 BY Launa Police, K    Bicarbonate 22.1 20.0 - 28.0 mmol/L   Acid-base deficit 1.7 0.0 - 2.0 mmol/L   O2 Saturation 43.6 %   Patient temperature 37.0     Comment: Performed at [facility], [address], [place], [place] [address]  CBC with Differential/Platelet     Status: Abnormal   Collection Time: 09/24/23 11:40 AM  Result Value Ref Range   WBC 15.5 (H) 4.0 - 10.5 K/uL   RBC 4.40 3.87 - 5.11 MIL/uL   Hemoglobin 13.4 12.0 - 15.0 g/dL   HCT 78.2 95.6 - 21.3 %   MCV 88.4 80.0 - 100.0 fL   MCH 30.5 26.0 - 34.0 pg   MCHC 34.4 30.0 - 36.0 g/dL   RDW 08.6 57.8 - 46.9 %   Platelets 249 150 - 400 K/uL   nRBC 0.0 0.0 - 0.2 %   Neutrophils Relative % 83 %   Neutro Abs 12.8 (H) 1.7 - 7.7 K/uL   Lymphocytes Relative 9 %   Lymphs Abs 1.5 0.7 - 4.0 K/uL   Monocytes Relative 7 %   Monocytes Absolute 1.1 (H) 0.1 - 1.0 K/uL   Eosinophils Relative 0 %    Eosinophils Absolute 0.0 0.0 - 0.5 K/uL   Basophils Relative 0 %   Basophils Absolute 0.0 0.0 - 0.1 K/uL   WBC Morphology INCREASED BANDS (>20% BANDS)    RBC Morphology MORPHOLOGY UNREMARKABLE  Smear Review MORPHOLOGY UNREMARKABLE    Immature Granulocytes 1 %   Abs Immature Granulocytes 0.09 (H) 0.00 - 0.07 K/uL   Reactive, Benign Lymphocytes PRESENT     Comment: Performed at [facility], [address], [place], [place] [address]  Magnesium     Status: None   Collection Time: 09/24/23 11:40 AM  Result Value Ref Range   Magnesium 1.7 1.7 - 2.4 mg/dL    Comment: Performed at [facility], [address], [place], [place] [address]  Resp panel by RT-PCR (RSV, Flu A&B, Covid) Anterior Nasal Swab     Status: None   Collection Time: 09/24/23 11:54 AM   Specimen: Anterior Nasal Swab  Result Value Ref Range   SARS Coronavirus 2 by RT PCR NEGATIVE NEGATIVE    Comment: (NOTE) SARS-CoV-2 target nucleic acids are NOT DETECTED.  The SARS-CoV-2 RNA is generally detectable in upper respiratory specimens during the acute phase of infection. The lowest concentration of SARS-CoV-2 viral copies this assay can detect is 138 copies/mL. A negative result does not preclude SARS-Cov-2 infection and should not be used as the sole basis for treatment or other patient management decisions. A negative result may occur with  improper specimen collection/handling, submission of specimen other than nasopharyngeal swab, presence of viral mutation(s) within the areas targeted by this assay, and inadequate number of viral copies(<138 copies/mL). A negative result must be combined with clinical observations, patient history, and epidemiological information. The expected result is Negative.  Fact Sheet for Patients:  BloggerCourse.com  Fact Sheet for Healthcare Providers:  SeriousBroker.it  This test is no t yet approved  or cleared by the [place]  FDA and  has been authorized for detection and/or diagnosis of SARS-CoV-2 by FDA under an Emergency Use Authorization (EUA). This EUA will remain  in effect (meaning this test can be used) for the duration of the COVID-19 declaration under Section 564(b)(1) of the Act, 21 U.S.C.section 360bbb-3(b)(1), unless the authorization is terminated  or revoked sooner.       Influenza A by PCR NEGATIVE NEGATIVE   Influenza B by PCR NEGATIVE NEGATIVE    Comment: (NOTE) The Xpert Xpress SARS-CoV-2/FLU/RSV plus assay is intended as an aid in the diagnosis of influenza from Nasopharyngeal swab specimens and should not be used as a sole basis for treatment. Nasal washings and aspirates are unacceptable for Xpert Xpress SARS-CoV-2/FLU/RSV testing.  Fact Sheet for Patients: BloggerCourse.com  Fact Sheet for Healthcare Providers: SeriousBroker.it  This test is not yet approved or cleared by the [place]  FDA and has been authorized for detection and/or diagnosis of SARS-CoV-2 by FDA under an Emergency Use Authorization (EUA). This EUA will remain in effect (meaning this test can be used) for the duration of the COVID-19 declaration under Section 564(b)(1) of the Act, 21 U.S.C. section 360bbb-3(b)(1), unless the authorization is terminated or revoked.     Resp Syncytial Virus by PCR NEGATIVE NEGATIVE    Comment: (NOTE) Fact Sheet for Patients: BloggerCourse.com  Fact Sheet for Healthcare Providers: SeriousBroker.it  This test is not yet approved or cleared by the [place]  FDA and has been authorized for detection and/or diagnosis of SARS-CoV-2 by FDA under an Emergency Use Authorization (EUA). This EUA will remain in effect (meaning this test can be used) for the duration of the COVID-19 declaration under Section 564(b)(1) of the Act, 21 U.S.C. section  360bbb-3(b)(1), unless the authorization is terminated or revoked.  Performed at [facility], [address], [place], [place] [address]   I-Stat Chem 8, ED     Status: Abnormal   Collection Time: 09/24/23 11:59 AM  Result Value Ref Range   Sodium 125 (L) 135 - 145 mmol/L   Potassium 4.2 3.5 - 5.1 mmol/L   Chloride 94 (L) 98 - 111 mmol/L   BUN 19 8 - 23 mg/dL   Creatinine, Ser 6.96 0.44 - 1.00 mg/dL   Glucose, Bld 295 (H) 70 - 99 mg/dL    Comment: Glucose reference range applies only to samples taken after fasting for at least 8 hours.   Calcium , Ion 1.12 (L) 1.15 - 1.40 mmol/L   TCO2 19 (L) 22 - 32 mmol/L   Hemoglobin 14.6 12.0 - 15.0 g/dL   HCT 28.4 13.2 - 44.0 %  Protime-INR     Status: None   Collection Time: 09/24/23  1:00 PM  Result Value Ref Range   Prothrombin Time 13.7 11.4 - 15.2 seconds   INR 1.0 0.8 - 1.2    Comment: (NOTE) INR goal varies based on device and disease states. Performed at [facility], [address] [place], [place] [address]   I-Stat Lactic Acid, ED     Status: Abnormal   Collection Time: 09/24/23  1:05 PM  Result Value Ref Range   Lactic Acid, Venous 3.1 (HH) 0.5 - 1.9 mmol/L   Comment NOTIFIED PHYSICIAN   Urinalysis, w/ Reflex to Culture (Infection Suspected) -Urine, Clean Catch     Status: Abnormal   Collection Time: 09/24/23  1:06 PM  Result Value Ref Range   Specimen Source URINE, CLEAN CATCH    Color, Urine YELLOW YELLOW   APPearance CLEAR CLEAR   Specific Gravity, Urine 1.010 1.005 - 1.030   pH 6.0 5.0 - 8.0   Glucose, UA 50 (A) NEGATIVE mg/dL   Hgb urine dipstick NEGATIVE NEGATIVE   Bilirubin Urine NEGATIVE NEGATIVE   Ketones, ur 5 (A) NEGATIVE mg/dL   Protein, ur NEGATIVE NEGATIVE mg/dL   Nitrite NEGATIVE NEGATIVE   Leukocytes,Ua NEGATIVE NEGATIVE   RBC / HPF 0-5 0 - 5 RBC/hpf   WBC, UA 0-5 0 - 5 WBC/hpf    Comment:        Reflex urine culture not performed if WBC <=10, OR if Squamous  epithelial cells >5. If Squamous epithelial cells >5 suggest recollection.    Bacteria, UA RARE (A) NONE SEEN   Squamous Epithelial / HPF 0-5 0 - 5 /HPF    Comment: Performed at [facility], [address] [place], [place] [address]   DG Chest Port 1 View Result Date: 09/24/2023 CLINICAL DATA:  Cough EXAM: PORTABLE CHEST 1 VIEW COMPARISON:  07/05/2021 FINDINGS: Heart and mediastinal contours within normal limits. Moderate-sized hiatal hernia. Left lung clear. Airspace opacity in the right lower lobe concerning for pneumonia. No effusions or acute bony abnormality. IMPRESSION: Right lower lobe airspace opacity concerning for pneumonia. Hiatal hernia. Electronically Signed   By: Janeece Mechanic M.D.   On: 09/24/2023 13:17    Pending Labs Unresulted Labs (From admission, onward)     Start     Ordered   09/25/23 0500  Basic metabolic panel  Tomorrow morning,   R        09/24/23 1439   09/25/23 0500  CBC  Tomorrow morning,   R        09/24/23 1439   09/24/23 1203  Legionella Pneumophila Serogp 1 Ur Ag  Once,   URGENT  09/24/23 1202   09/24/23 1200  Blood Culture (routine x 2)  (Undifferentiated presentation (screening labs and basic nursing orders))  BLOOD CULTURE X 2,   STAT      09/24/23 1159            Vitals/Pain Today's Vitals   09/24/23 1354 09/24/23 1400 09/24/23 1415 09/24/23 1430  BP: 114/64     Pulse: (!) 129 (!) 130 (!) 131 (!) 129  Resp: (!) 24 (!) 22 (!) 29 (!) 21  Temp: 98.2 F (36.8 C)     TempSrc: Oral     SpO2: 97% 96% 94% 95%  Weight:      Height:      PainSc: 0-No pain       Isolation Precautions No active isolations  Medications Medications  azithromycin (ZITHROMAX) 500 mg in sodium chloride  0.9 % 250 mL IVPB (500 mg Intravenous New Bag/Given 09/24/23 1353)  azithromycin (ZITHROMAX) 500 mg in sodium chloride  0.9 % 250 mL IVPB (has no administration in time range)  cefTRIAXone (ROCEPHIN) 1 g in sodium chloride  0.9 % 100 mL IVPB  (has no administration in time range)  metoprolol  tartrate (LOPRESSOR ) tablet 12.5 mg (has no administration in time range)  0.9 %  sodium chloride  infusion (has no administration in time range)  metoprolol  succinate (TOPROL -XL) 24 hr tablet 12.5 mg (has no administration in time range)  ALPRAZolam  (XANAX ) tablet 0.25 mg (has no administration in time range)  pantoprazole  (PROTONIX ) EC tablet 40 mg (has no administration in time range)  heparin injection 5,000 Units (has no administration in time range)  acetaminophen  (TYLENOL ) tablet 650 mg (has no administration in time range)    Or  acetaminophen  (TYLENOL ) suppository 650 mg (has no administration in time range)  ondansetron  (ZOFRAN ) tablet 4 mg (has no administration in time range)    Or  ondansetron  (ZOFRAN ) injection 4 mg (has no administration in time range)  albuterol (PROVENTIL) (2.5 MG/3ML) 0.083% nebulizer solution 2.5 mg (has no administration in time range)  sodium chloride  0.9 % bolus 500 mL (0 mLs Intravenous Stopped 09/24/23 1443)  cefTRIAXone (ROCEPHIN) 1 g in sodium chloride  0.9 % 100 mL IVPB (0 g Intravenous Stopped 09/24/23 1443)  sodium chloride  0.9 % bolus 500 mL (0 mLs Intravenous Stopped 09/24/23 1443)    Mobility walks

## 2023-09-24 NOTE — Plan of Care (Signed)

## 2023-09-24 NOTE — ED Provider Notes (Signed)
 [facility] AT [facility] Provider Note   CSN: [id number] Arrival date & time: 09/24/23  1131     History  Chief Complaint  Patient presents with   Cough    [name] is a 89 y.o. female.  HPI Patient presents for cough and generalized weakness.  Medical history includes HLD, HTN, prediabetes, osteopenia, GERD.  Cough has been present over the past 2 weeks.  She has been treating this at home with Mucinex.  She states that her sputum has been brown in color.  With her cough, she has had a worsening generalized weakness.  EMS was called to her home this morning.  They did note SpO2 of 90% on room air.  She is not on oxygen at baseline.  They noted wheezing and rhonchi, auscultation.  She was given DuoNeb and Solu-Medrol  prior to arrival.  Patient does feel like breathing treatment has helped.  At baseline, patient lives independently.  She has family and friends that help her.    Home Medications Prior to Admission medications   Medication Sig Start Date End Date Taking? Authorizing Provider  ALPRAZolam  (XANAX ) 0.25 MG tablet Take 0.25 mg by mouth at bedtime.    [provider]  amLODipine  (NORVASC ) 5 MG tablet Take 0.5 tablets (2.5 mg total) by mouth daily. Patient taking differently: Take 5 mg by mouth daily. 12/06/18   Johnson, Clanford L, MD  Ascorbic Acid  (VITAMIN C ) 1000 MG tablet Take 1,000 mg by mouth daily.    [provider]  aspirin  325 MG EC tablet Take 325 mg by mouth daily.    [provider]  calcium  carbonate (TUMS EX) 750 MG chewable tablet Chew 1 tablet by mouth at bedtime.    [provider]  Cholecalciferol  (VITAMIN D ) 1000 UNITS capsule Take 1,000 Units by mouth daily.      [provider]  Coenzyme Q10 (CO Q-10) 200 MG CAPS Take 1 capsule by mouth daily.     [provider]  Cyanocobalamin  (B-12) 500 MCG SUBL Place 500 mcg under the tongue daily.    [provider]   diclofenac  Sodium (VOLTAREN ) 1 % GEL Apply 2 g topically daily as needed. Patient taking differently: Apply 2 g topically at bedtime as needed (pain). 05/05/22   Charity Conch, DPM  doxycycline  (VIBRA -TABS) 100 MG tablet Take 1 tablet (100 mg total) by mouth 2 (two) times daily. Patient not taking: Reported on 08/09/2023 11/15/22   Charity Conch, DPM  ezetimibe  (ZETIA ) 10 MG tablet Take 10 mg by mouth daily. 02/26/16   [provider]  gabapentin  (NEURONTIN ) 100 MG capsule Take 1 capsule (100 mg total) by mouth at bedtime. 04/18/22   Phebe Brasil, MD  HYDROcodone -acetaminophen  (NORCO/VICODIN) 5-325 MG tablet Take 1 tablet by mouth every 6 (six) hours as needed. Patient not taking: Reported on 08/09/2023 12/15/22   Charity Conch, DPM  ibuprofen  (ADVIL ) 200 MG tablet Take 200 mg by mouth every 6 (six) hours as needed for moderate pain.    [provider]  metoprolol  succinate (TOPROL -XL) 25 MG 24 hr tablet TAKE 1/2 TABLET BY MOUTH DAILY Patient taking differently: Take 12.5 mg by mouth daily. 10/01/17   Nahser, Lela Purple, MD  Misc Natural Products (GLUCOSAMINE CHONDROITIN ADV PO) Take 1 tablet by mouth daily.    [provider]  Multiple Vitamin (MULTIVITAMIN) tablet Take 1 tablet by mouth daily.      [provider]  Multiple Vitamins-Minerals (  ICAPS AREDS 2 PO) Take 1 capsule by mouth daily.    [provider]  mupirocin  ointment (BACTROBAN ) 2 % Apply 1 Application topically 2 (two) times daily. 10/13/22   Charity Conch, DPM  MYRBETRIQ  50 MG TB24 tablet Take 25 mg by mouth daily as needed (bladder control).    [provider]  neomycin-bacitracin-polymyxin (NEOSPORIN) OINT Apply 1 Application topically as needed for wound care.    [provider]  omeprazole  (PRILOSEC) 20 MG capsule Take 1 capsule (20 mg total) by mouth daily. 05/27/15   Driscilla George, MD  PREVIDENT 5000 BOOSTER PLUS 1.1 % PSTE Place 1 Application onto  teeth 2 (two) times daily. 03/03/21   [provider]  Probiotic Product (PHILLIPS COLON HEALTH) CAPS Take 1 capsule by mouth daily.    [provider]  raloxifene  (EVISTA ) 60 MG tablet TAKE 1 TABLET BY MOUTH ONCE DAILY 12/23/14   Driscilla George, MD      Allergies    Cellcept  [mycophenolate ] and Covid-19 (mrna bivalent) vaccine (pfizer) [covid-19 (mrna) vaccine]    Review of Systems   Review of Systems  Constitutional:  Positive for fatigue.  Respiratory:  Positive for cough, chest tightness and shortness of breath.   Neurological:  Positive for weakness (Generalized).  All other systems reviewed and are negative.   Physical Exam Updated Vital Signs BP 114/64 (BP Location: Right Arm)   Pulse (!) 129   Temp 98.2 F (36.8 C) (Oral)   Resp (!) 24   Ht 4\' 10"  (1.473 m)   Wt 39.9 kg   SpO2 97%   BMI 18.39 kg/m  Physical Exam Vitals and nursing note reviewed.  Constitutional:      General: She is not in acute distress.    Appearance: Normal appearance. She is well-developed. She is not ill-appearing, toxic-appearing or diaphoretic.  HENT:     Head: Normocephalic and atraumatic.     Right Ear: External ear normal.     Left Ear: External ear normal.     Nose: Nose normal.     Mouth/Throat:     Mouth: Mucous membranes are dry.  Eyes:     Extraocular Movements: Extraocular movements intact.     Conjunctiva/sclera: Conjunctivae normal.  Cardiovascular:     Rate and Rhythm: Normal rate and regular rhythm.     Heart sounds: No murmur heard. Pulmonary:     Effort: Pulmonary effort is normal. Tachypnea present. No respiratory distress.     Breath sounds: Examination of the right-lower field reveals decreased breath sounds. Examination of the left-lower field reveals decreased breath sounds. Decreased breath sounds and rhonchi present.  Abdominal:     General: There is no distension.     Palpations: Abdomen is soft.     Tenderness: There is no abdominal  tenderness.  Musculoskeletal:        General: No swelling.     Cervical back: Normal range of motion and neck supple.  Skin:    General: Skin is warm and dry.     Coloration: Skin is not jaundiced or pale.  Neurological:     General: No focal deficit present.     Mental Status: She is alert and oriented to person, place, and time.  Psychiatric:        Mood and Affect: Mood normal.        Behavior: Behavior normal.     ED Results / Procedures / Treatments   Labs (all labs ordered are listed, but only abnormal  results are displayed) Labs Reviewed  COMPREHENSIVE METABOLIC PANEL WITH GFR - Abnormal; Notable for the following components:      Result Value   Sodium 124 (*)    Chloride 91 (*)    CO2 20 (*)    Glucose, Bld 252 (*)    Creatinine, Ser 1.01 (*)    Calcium  8.6 (*)    Albumin 3.1 (*)    GFR, Estimated 52 (*)    All other components within normal limits  BRAIN NATRIURETIC PEPTIDE - Abnormal; Notable for the following components:   B Natriuretic Peptide 348.7 (*)    All other components within normal limits  BLOOD GAS, VENOUS - Abnormal; Notable for the following components:   pCO2, Ven 34 (*)    pO2, Ven <31 (*)    All other components within normal limits  CBC WITH DIFFERENTIAL/PLATELET - Abnormal; Notable for the following components:   WBC 15.5 (*)    Neutro Abs 12.8 (*)    Monocytes Absolute 1.1 (*)    Abs Immature Granulocytes 0.09 (*)    All other components within normal limits  URINALYSIS, W/ REFLEX TO CULTURE (INFECTION SUSPECTED) - Abnormal; Notable for the following components:   Glucose, UA 50 (*)    Ketones, ur 5 (*)    Bacteria, UA RARE (*)    All other components within normal limits  I-STAT CHEM 8, ED - Abnormal; Notable for the following components:   Sodium 125 (*)    Chloride 94 (*)    Glucose, Bld 243 (*)    Calcium , Ion 1.12 (*)    TCO2 19 (*)    All other components within normal limits  I-STAT CG4 LACTIC ACID, ED - Abnormal; Notable  for the following components:   Lactic Acid, Venous 3.1 (*)    All other components within normal limits  RESP PANEL BY RT-PCR (RSV, FLU A&B, COVID)  RVPGX2  CULTURE, BLOOD (ROUTINE X 2)  CULTURE, BLOOD (ROUTINE X 2)  MAGNESIUM  PROTIME-INR  LEGIONELLA PNEUMOPHILA SEROGP 1 UR AG  I-STAT CG4 LACTIC ACID, ED    EKG EKG Interpretation Date/Time:  Monday September 24 2023 11:53:28 EDT Ventricular Rate:  135 PR Interval:  130 QRS Duration:  83 QT Interval:  291 QTC Calculation: 437 R Axis:   46  Text Interpretation: Sinus tachycardia LAE, consider biatrial enlargement ST depression, probably rate related Confirmed by Iva Mariner [phone]) on 09/24/2023 1:30:28 PM  Radiology DG Chest Port 1 View Result Date: 09/24/2023 CLINICAL DATA:  Cough EXAM: PORTABLE CHEST 1 VIEW COMPARISON:  07/05/2021 FINDINGS: Heart and mediastinal contours within normal limits. Moderate-sized hiatal hernia. Left lung clear. Airspace opacity in the right lower lobe concerning for pneumonia. No effusions or acute bony abnormality. IMPRESSION: Right lower lobe airspace opacity concerning for pneumonia. Hiatal hernia. Electronically Signed   By: Janeece Mechanic M.D.   On: 09/24/2023 13:17    Procedures Procedures    Medications Ordered in ED Medications  azithromycin (ZITHROMAX) 500 mg in sodium chloride  0.9 % 250 mL IVPB (500 mg Intravenous New Bag/Given 09/24/23 1353)  sodium chloride  0.9 % bolus 500 mL (500 mLs Intravenous New Bag/Given 09/24/23 1305)  cefTRIAXone (ROCEPHIN) 1 g in sodium chloride  0.9 % 100 mL IVPB (1 g Intravenous New Bag/Given 09/24/23 1335)  sodium chloride  0.9 % bolus 500 mL (500 mLs Intravenous New Bag/Given 09/24/23 1336)    ED Course/ Medical Decision Making/ A&P  Medical Decision Making Amount and/or Complexity of Data Reviewed Labs: ordered. Radiology: ordered.  Risk Decision regarding hospitalization.   This patient presents to the ED for concern of  cough and generalized weakness, this involves an extensive number of treatment options, and is a complaint that carries with it a high risk of complications and morbidity.  The differential diagnosis includes pneumonia, reactive airway disease, CHF, URI   Co morbidities that complicate the patient evaluation  HLD, HTN, prediabetes, osteopenia, GERD   Additional history obtained:  Additional history obtained from EMS External records from outside source obtained and reviewed including EMR   Lab Tests:  I Ordered, and personally interpreted labs.  The pertinent results include: Leukocytosis, lactic acidosis, and hyponatremia are present.   Imaging Studies ordered:  I ordered imaging studies including chest x-ray I independently visualized and interpreted imaging which showed right lower lobe pneumonia I agree with the radiologist interpretation   Cardiac Monitoring: / EKG:  The patient was maintained on a cardiac monitor.  I personally viewed and interpreted the cardiac monitored which showed an underlying rhythm of: Sinus rhythm   Problem List / ED Course / Critical interventions / Medication management  Patient presents for progressive cough and generalized weakness over the past 2 weeks.  She was treated for reactive airway disease prior to arrival.  On arrival, patient is tachypneic and tachycardic.  She was found to be hypoxic on room air and was placed on 2 L of supplemental oxygen.  Workup is notable for lactic acidosis, leukocytosis, hyponatremia, and findings on x-ray consistent with right lower lobe pneumonia.  She had persistent tachycardic and additional IV fluids were ordered.  Her rhythm appears to be a sinus rhythm.  She has had a steady tachycardia in the range of 135-140.  This suggested possible atrial flutter with 2-1 conduction, however, this is not evident on cardiac monitor or EKG.  On reassessment, patient resting comfortably on supplemental oxygen.  Her friend  and power of attorney is present at bedside.  Patient was informed of workup results and need for admission.  She states that she has had very poor p.o. intake lately.  This is likely cause of hyponatremia.  Will send for Legionella testing as well.  She was admitted for further management. I ordered medication including IV fluids for hydration; ceftriaxone and azithromycin for pneumonia Reevaluation of the patient after these medicines showed that the patient improved I have reviewed the patients home medicines and have made adjustments as needed  Social Determinants of Health:  Lives independently  CRITICAL CARE Performed by: Iva Mariner   Total critical care time: 35 minutes  Critical care time was exclusive of separately billable procedures and treating other patients.  Critical care was necessary to treat or prevent imminent or life-threatening deterioration.  Critical care was time spent personally by me on the following activities: development of treatment plan with patient and/or surrogate as well as nursing, discussions with consultants, evaluation of patient's response to treatment, examination of patient, obtaining history from patient or surrogate, ordering and performing treatments and interventions, ordering and review of laboratory studies, ordering and review of radiographic studies, pulse oximetry and re-evaluation of patient's condition.         Final Clinical Impression(s) / ED Diagnoses Final diagnoses:  Pneumonia of right lower lobe due to infectious organism  Acute respiratory failure with hypoxia (HCC)  Hyponatremia    Rx / DC Orders ED Discharge Orders     None  Iva Mariner, MD 09/24/23 [phone]

## 2023-09-24 NOTE — ED Notes (Signed)
 Report given to Abraham Hoffmann, RN on 4th floor

## 2023-09-24 NOTE — ED Triage Notes (Signed)
 Patient bib gcems for increased weakness, rhonchi and cough. Given 125mg  solumedrol in route and duoneb in route . 300mL of fluids in route

## 2023-09-25 DIAGNOSIS — J9601 Acute respiratory failure with hypoxia: Secondary | ICD-10-CM | POA: Diagnosis not present

## 2023-09-25 DIAGNOSIS — E871 Hypo-osmolality and hyponatremia: Secondary | ICD-10-CM | POA: Diagnosis not present

## 2023-09-25 DIAGNOSIS — J189 Pneumonia, unspecified organism: Secondary | ICD-10-CM

## 2023-09-25 LAB — CBC
HCT: 35.2 % — ABNORMAL LOW (ref 36.0–46.0)
Hemoglobin: 11.3 g/dL — ABNORMAL LOW (ref 12.0–15.0)
MCH: 30 pg (ref 26.0–34.0)
MCHC: 32.1 g/dL (ref 30.0–36.0)
MCV: 93.4 fL (ref 80.0–100.0)
Platelets: 229 10*3/uL (ref 150–400)
RBC: 3.77 MIL/uL — ABNORMAL LOW (ref 3.87–5.11)
RDW: 13.7 % (ref 11.5–15.5)
WBC: 19.4 10*3/uL — ABNORMAL HIGH (ref 4.0–10.5)
nRBC: 0 % (ref 0.0–0.2)

## 2023-09-25 LAB — BASIC METABOLIC PANEL WITH GFR
Anion gap: 7 (ref 5–15)
BUN: 13 mg/dL (ref 8–23)
CO2: 19 mmol/L — ABNORMAL LOW (ref 22–32)
Calcium: 8 mg/dL — ABNORMAL LOW (ref 8.9–10.3)
Chloride: 108 mmol/L (ref 98–111)
Creatinine, Ser: 0.74 mg/dL (ref 0.44–1.00)
GFR, Estimated: >60 mL/min (ref >60)
Glucose, Bld: 223 mg/dL — ABNORMAL HIGH (ref 70–99)
Potassium: 4.2 mmol/L (ref 3.5–5.1)
Sodium: 134 mmol/L — ABNORMAL LOW (ref 135–145)

## 2023-09-25 LAB — GLUCOSE, CAPILLARY
Glucose-Capillary: 242 mg/dL — ABNORMAL HIGH (ref 70–99)
Glucose-Capillary: 113 mg/dL — ABNORMAL HIGH (ref 70–99)

## 2023-09-25 LAB — HEMOGLOBIN A1C
Hgb A1c MFr Bld: 7.4 % — ABNORMAL HIGH (ref 4.8–5.6)
Mean Plasma Glucose: 165.68 mg/dL

## 2023-09-25 MED ORDER — INSULIN GLARGINE-YFGN 100 UNIT/ML ~~LOC~~ SOLN
5.0000 [IU] | Freq: Every day | SUBCUTANEOUS | Status: DC
  Administered 2023-09-25 – 2023-09-26 (×2): 5 [IU] via SUBCUTANEOUS
  Filled 2023-09-25 (×3): qty 0.05

## 2023-09-25 MED ORDER — GABAPENTIN 100 MG PO CAPS
100.0000 mg | ORAL_CAPSULE | Freq: Once | ORAL | Status: AC
  Administered 2023-09-25: 100 mg via ORAL
  Filled 2023-09-25: qty 1

## 2023-09-25 MED ORDER — INSULIN ASPART 100 UNIT/ML IJ SOLN
0.0000 [IU] | Freq: Three times a day (TID) | INTRAMUSCULAR | Status: DC
  Administered 2023-09-25: 3 [IU] via SUBCUTANEOUS
  Administered 2023-09-26: 1 [IU] via SUBCUTANEOUS

## 2023-09-25 MED ORDER — GUAIFENESIN-DM 100-10 MG/5ML PO SYRP
5.0000 mL | ORAL_SOLUTION | ORAL | Status: DC | PRN
  Administered 2023-09-25 – 2023-09-26 (×2): 5 mL via ORAL
  Filled 2023-09-25 (×2): qty 10

## 2023-09-25 MED ORDER — BENZONATATE 100 MG PO CAPS
100.0000 mg | ORAL_CAPSULE | Freq: Once | ORAL | Status: AC
  Administered 2023-09-25: 100 mg via ORAL
  Filled 2023-09-25: qty 1

## 2023-09-25 NOTE — Evaluation (Signed)
 Occupational Therapy Evaluation Patient Details Name: [name] MRN: [id number] DOB: 1929/02/14 Today's Date: 09/25/2023   History of Present Illness   [name] is a 89 yo female admitted with CAP/sepsis. Hx of HTN, GERD, fibromyalgia, osteoporosis, breast cancer s/p mastectomy, COVID, osteopenia.     Clinical Impressions PTA, patient was modified independent living alone and driving. Patient sponge bathes at baseline, uses a SPC in community, has a house cleaner 1 x per month and strong friend support (closest is POA patient reports). Currently, patient presents with deficits outlined below (see OT Problem List for details) most significantly generalized weakness, O2 needs, decreased activity tolerance and balance. Anticipate with Acute OT services while in hospital setting and caregiver support patient may progress to home with [facility] services as is patient's strong preference verbalized this session.      If plan is discharge home, recommend the following:   A lot of help with walking and/or transfers;A little help with bathing/dressing/bathroom;Assistance with cooking/housework;Direct supervision/assist for medications management;Direct supervision/assist for financial management;Assist for transportation;Help with stairs or ramp for entrance     Functional Status Assessment   Patient has had a recent decline in their functional status and demonstrates the ability to make significant improvements in function in a reasonable and predictable amount of time.     Equipment Recommendations   None recommended by OT      Precautions/Restrictions   Precautions Precautions: None Restrictions Weight Bearing Restrictions Per Provider Order: No     Mobility Bed Mobility Overal bed mobility: Needs Assistance Bed Mobility: Rolling, Supine to Sit Rolling: Supervision, Used rails   Supine to sit: Contact guard, HOB elevated, Used rails          Transfers Overall  transfer level: Needs assistance Equipment used: Rolling walker (2 wheels) Transfers: Sit to/from Stand, Bed to chair/wheelchair/BSC Sit to Stand: Contact guard assist Stand pivot transfers: Contact guard assist, From elevated surface         General transfer comment: min cues for hand placement and 5 steps to recliner      Balance Overall balance assessment: Needs assistance Sitting-balance support: Feet supported Sitting balance-Leahy Scale: Fair     Standing balance support: Bilateral upper extremity supported, During functional activity, Reliant on assistive device for balance Standing balance-Leahy Scale: Poor                             ADL either performed or assessed with clinical judgement   ADL Overall ADL's : Needs assistance/impaired Eating/Feeding: Independent;Bed level   Grooming: Wash/dry hands;Wash/dry face;Oral care;Set up;Sitting   Upper Body Bathing: Set up;Sitting   Lower Body Bathing: Minimal assistance;Sitting/lateral leans;Sit to/from stand   Upper Body Dressing : Set up;Sitting   Lower Body Dressing: Minimal assistance;Sitting/lateral leans;Sit to/from stand   Toilet Transfer: Contact guard assist   Toileting- Clothing Manipulation and Hygiene: Contact guard assist       Functional mobility during ADLs: Rolling walker (2 wheels) General ADL Comments: rest breaks required throughout session     Vision Baseline Vision/History: 0 No visual deficits Ability to See in Adequate Light: 0 Adequate Patient Visual Report: No change from baseline Vision Assessment?: No apparent visual deficits     Perception Perception: Within Functional Limits       Praxis Praxis: WFL       Pertinent Vitals/Pain Pain Assessment Pain Assessment: No/denies pain     Extremity/Trunk Assessment Upper Extremity Assessment Upper Extremity Assessment: Right  hand dominant;Generalized weakness   Lower Extremity Assessment Lower Extremity  Assessment: Generalized weakness   Cervical / Trunk Assessment Cervical / Trunk Assessment: Kyphotic   Communication Communication Communication: Impaired Factors Affecting Communication: Hearing impaired   Cognition Arousal: Alert Behavior During Therapy: WFL for tasks assessed/performed Cognition: No apparent impairments                               Following commands: Intact       Cueing  General Comments   Cueing Techniques: Verbal cues  O2 sats 98% on 2 ltrs O2 via [place]   Exercises     Shoulder Instructions      Home Living Family/patient expects to be discharged to:: Private residence Living Arrangements: Alone Available Help at Discharge: Friend(s) Type of Home: House Home Access: Stairs to enter Secretary/administrator of Steps: 4 Entrance Stairs-Rails: Right;Left Home Layout: One level     Bathroom Shower/Tub: Chief Strategy Officer: Standard Bathroom Accessibility: Yes How Accessible: Accessible via walker Home Equipment: Cane - single point;Rolling Walker (2 wheels);Adaptive equipment Adaptive Equipment: Reacher        Prior Functioning/Environment Prior Level of Function : Independent/Modified Independent             Mobility Comments: SPC for community ADLs Comments: sponge bathes at baseline, has a cleaning woman 1x per month    OT Problem List: Decreased strength;Decreased activity tolerance;Impaired balance (sitting and/or standing);Decreased knowledge of use of DME or AE;Decreased knowledge of precautions;Cardiopulmonary status limiting activity   OT Treatment/Interventions: Self-care/ADL training;Therapeutic exercise;Energy conservation;DME and/or AE instruction;Therapeutic activities;Patient/family education;Balance training      OT Goals(Current goals can be found in the care plan section)   Acute Rehab OT Goals Patient Stated Goal: "I want to go home" OT Goal Formulation: With patient Time For Goal  Achievement: 10/09/23 Potential to Achieve Goals: Good ADL Goals Pt Will Perform Lower Body Bathing: with modified independence;with adaptive equipment;sitting/lateral leans;sit to/from stand Pt Will Perform Lower Body Dressing: with modified independence;sitting/lateral leans;sit to/from stand;with adaptive equipment Pt Will Transfer to Toilet: with modified independence;ambulating Pt/caregiver will Perform Home Exercise Program: Increased strength;With written HEP provided;Independently Additional ADL Goal #1: Patient will teach back 3-4/4 ECTs during ADL's and mobility independently   OT Frequency:  Min 2X/week       AM-PAC OT "6 Clicks" Daily Activity     Outcome Measure Help from another person eating meals?: None Help from another person taking care of personal grooming?: A Little Help from another person toileting, which includes using toliet, bedpan, or urinal?: A Little Help from another person bathing (including washing, rinsing, drying)?: A Little Help from another person to put on and taking off regular upper body clothing?: A Little Help from another person to put on and taking off regular lower body clothing?: A Little 6 Click Score: 19   End of Session Equipment Utilized During Treatment: Gait belt;Rolling walker (2 wheels);Oxygen Nurse Communication: Mobility status  Activity Tolerance: Patient limited by fatigue Patient left: in chair;with call bell/phone within reach;with chair alarm set  OT Visit Diagnosis: Unsteadiness on feet (R26.81);Muscle weakness (generalized) (M62.81)                Time: 1610-9604 OT Time Calculation (min): 39 min Charges:  OT General Charges $OT Visit: 1 Visit OT Evaluation $OT Eval Moderate Complexity: 1 Mod OT Treatments $Self Care/Home Management : 23-37 mins  [facility] OT/L Acute Rehabilitation Department  [phone]  09/25/2023, 11:38 AM

## 2023-09-25 NOTE — Progress Notes (Signed)
 [facility]  PROGRESS NOTE  HUYEN JUNGERS ZHY:[id number] DOB: May 09, 1929 DOA: 09/24/2023 PCP: Aldo Hun, MD   Brief HPI:   89 y.o. female with medical history significant for hypertension, hyperlipidemia, prediabetes diet-controlled, GERD who lives independently and is being admitted to the hospital with sepsis due to community-acquired pneumonia.  She has had a cough productive of brown sputum for the last couple of weeks.  She denies any fevers or chills, she called EMS today due to continued weakness and sputum production, she was noted to be 90% on room air.  She was given DuoNeb breathing treatment and IV Solu-Medrol  125 mg by EMS.  She denies any chest pain, did not take any of her scheduled medications this morning.     Assessment/Plan:   Severe sepsis -Presented with tachycardia, tachypnea, leukocytosis, lactic 3.1 -Source of infection, community-acquired pneumonia -Started on empiric Zithromax and Rocephin -Follow blood culture results -Initial lactic acid 3.1, improved to 1.7 with hydration   Hyponatremia -Improving, sodium 134 today   Sinus tachycardia -Likely in setting of above -Continue metoprolol  12.5 mg daily   Diastolic dysfunction, with elevated BNP -BNP elevated at 348 -Patient now requiring 2 L/min of oxygen   Hypertension -Blood pressure is soft, hold amlodipine   GERD -Continue Protonix   Hyperglycemia - CBG has been elevated, check hemoglobin A1c - Start sliding scale insulin  with NovoLog  - Start Lantus  5 unit subcu daily  Medications     heparin  5,000 Units Subcutaneous Q8H   metoprolol  succinate  12.5 mg Oral Daily   pantoprazole   40 mg Oral Daily     Data Reviewed:   CBG:  No results for input(s): "GLUCAP" in the last 168 hours.  SpO2: 96 % O2 Flow Rate (L/min): 2 L/min    Vitals:   09/24/23 1527 09/24/23 1800 09/24/23 1936 09/25/23 0444  BP: 124/63  112/70 113/65  Pulse: (!) 127  (!) 107 (!) 107  Resp: 20 15 20 20   Temp:  98.2 F (36.8 C)  97.8 F (36.6 C) 97.8 F (36.6 C)  TempSrc: Oral  Oral Oral  SpO2: 94%  95% 96%  Weight:      Height:          Data Reviewed:  Basic Metabolic Panel: Recent Labs  Lab 09/24/23 1140 09/24/23 1159 09/24/23 2045 09/25/23 0340  NA 124* 125* 131* 134*  K 4.0 4.2 3.9 4.2  CL 91* 94* 104 108  CO2 20*  --  17* 19*  GLUCOSE 252* 243* 376* 223*  BUN 21 19 15 13   CREATININE 1.01* 1.00 0.65 0.74  CALCIUM  8.6*  --  7.9* 8.0*  MG 1.7  --   --   --     CBC: Recent Labs  Lab 09/24/23 1140 09/24/23 1159 09/25/23 0340  WBC 15.5*  --  19.4*  NEUTROABS 12.8*  --   --   HGB 13.4 14.6 11.3*  HCT 38.9 43.0 35.2*  MCV 88.4  --  93.4  PLT 249  --  229    LFT Recent Labs  Lab 09/24/23 1140  AST 18  ALT 20  ALKPHOS 75  BILITOT 0.7  PROT 6.8  ALBUMIN 3.1*     Antibiotics: Anti-infectives (From admission, onward)    Start     Dose/Rate Route Frequency Ordered Stop   09/25/23 1000  azithromycin (ZITHROMAX) 500 mg in sodium chloride  0.9 % 250 mL IVPB        500 mg 250 mL/hr over 60 Minutes Intravenous Every  24 hours 09/24/23 1433     09/25/23 1000  cefTRIAXone (ROCEPHIN) 1 g in sodium chloride  0.9 % 100 mL IVPB        1 g 200 mL/hr over 30 Minutes Intravenous Every 24 hours 09/24/23 1433     09/24/23 1315  cefTRIAXone (ROCEPHIN) 1 g in sodium chloride  0.9 % 100 mL IVPB        1 g 200 mL/hr over 30 Minutes Intravenous  Once 09/24/23 1309 09/24/23 1443   09/24/23 1315  azithromycin (ZITHROMAX) 500 mg in sodium chloride  0.9 % 250 mL IVPB        500 mg 250 mL/hr over 60 Minutes Intravenous  Once 09/24/23 1309 09/24/23 1453        DVT prophylaxis: Heparin  Code Status: DNR  Family Communication: No family at bedside   CONSULTS    Subjective   Denies chest pain or shortness of breath.  Still coughing up phlegm.   Objective    Physical Examination:   General-appears in no acute distress Heart-S1-S2, regular, no murmur  auscultated Lungs-scattered rhonchi bilaterally Abdomen-soft, nontender, no organomegaly Extremities-no edema in the lower extremities Neuro-alert, oriented x3, no focal deficit noted   Status is: Inpatient:             Kellie Chisolm S Jayveon Convey   [facility] If 7PM-7AM, please contact night-coverage at [email or web address], Office  [phone]   09/25/2023, 8:00 AM  LOS: 1 day

## 2023-09-25 NOTE — Evaluation (Signed)
 Physical Therapy Evaluation Patient Details Name: CHRISHELLE FEST MRN: [id number] DOB: 01/25/1929 Today's Date: 09/25/2023  History of Present Illness  89 yo female admitted with CAP/sepsis. Hx of HTN, GERD, fibromyalgia, osteoporosis, breast cancer s/p mastectomy, COVID, osteopenia.  Clinical Impression  On eval, pt was Supv level for mobility. She walked ~150 feet with a RW. Pt tolerated activity well. She required some encouragement to progress activity. Also encouraged pt to try to mobilize more during the day with nursing instead of just remaining in bed on purewick. Pt lives alone and plans to return home once discharged. HR 113 bpm, O2 92% on RA during session. Will recommend HHPT f/u if pt is agreeable.         If plan is discharge home, recommend the following: A little help with walking and/or transfers;A little help with bathing/dressing/bathroom;Assistance with cooking/housework;Assist for transportation;Help with stairs or ramp for entrance   Can travel by private vehicle        Equipment Recommendations None recommended by PT  Recommendations for Other Services       Functional Status Assessment Patient has had a recent decline in their functional status and demonstrates the ability to make significant improvements in function in a reasonable and predictable amount of time.     Precautions / Restrictions Precautions Precaution/Restrictions Comments: monitor HR, O2 Restrictions Weight Bearing Restrictions Per Provider Order: No      Mobility  Bed Mobility Overal bed mobility: Needs Assistance Bed Mobility: Supine to Sit, Sit to Supine     Supine to sit: Supervision, HOB elevated, Used rails Sit to supine: Supervision, HOB elevated, Used rails        Transfers Overall transfer level: Needs assistance Equipment used: Rolling walker (2 wheels) Transfers: Sit to/from Stand Sit to Stand: Supervision           General transfer comment: Cues for safety, hand  placement. Increased time.    Ambulation/Gait Ambulation/Gait assistance: Supervision Gait Distance (Feet): 150 Feet Assistive device: Rolling walker (2 wheels) Gait Pattern/deviations: Step-through pattern, Decreased stride length       General Gait Details: Supv for safety. Fair gait speed. Tolerated distance well. O2 92% on RA, HR 113 bpm  Stairs            Wheelchair Mobility     Tilt Bed    Modified Rankin (Stroke Patients Only)       Balance Overall balance assessment: Needs assistance         Standing balance support: Bilateral upper extremity supported, During functional activity, Reliant on assistive device for balance Standing balance-Leahy Scale: Fair                               Pertinent Vitals/Pain Pain Assessment Pain Assessment: No/denies pain    Home Living Family/patient expects to be discharged to:: Private residence Living Arrangements: Alone Available Help at Discharge: Friend(s) Type of Home: House Home Access: Stairs to enter Entrance Stairs-Rails: Doctor, general practice of Steps: 4   Home Layout: One level Home Equipment: Cane - single Librarian, academic (2 wheels);Adaptive equipment      Prior Function Prior Level of Function : Independent/Modified Independent             Mobility Comments: uses RW vs cane ADLs Comments: sponge bathes at baseline, has cleaning service 1x per month     Extremity/Trunk Assessment   Upper Extremity Assessment Upper Extremity Assessment: Defer to OT  evaluation    Lower Extremity Assessment Lower Extremity Assessment: Generalized weakness    Cervical / Trunk Assessment Cervical / Trunk Assessment: Kyphotic  Communication   Communication Communication: Impaired Factors Affecting Communication: Hearing impaired    Cognition Arousal: Alert Behavior During Therapy: WFL for tasks assessed/performed                             Following  commands: Intact       Cueing Cueing Techniques: Verbal cues     General Comments      Exercises     Assessment/Plan    PT Assessment Patient needs continued PT services  PT Problem List Decreased strength;Decreased range of motion;Decreased balance;Decreased mobility;Decreased activity tolerance;Decreased knowledge of use of DME;Pain       PT Treatment Interventions DME instruction;Gait training;Functional mobility training;Therapeutic activities;Therapeutic exercise;Patient/family education;Balance training    PT Goals (Current goals can be found in the Care Plan section)  Acute Rehab PT Goals Patient Stated Goal: home PT Goal Formulation: With patient Time For Goal Achievement: 10/09/23 Potential to Achieve Goals: Good    Frequency Min 3X/week     Co-evaluation               AM-PAC PT "6 Clicks" Mobility  Outcome Measure Help needed turning from your back to your side while in a flat bed without using bedrails?: None Help needed moving from lying on your back to sitting on the side of a flat bed without using bedrails?: A Little Help needed moving to and from a bed to a chair (including a wheelchair)?: A Little Help needed standing up from a chair using your arms (e.g., wheelchair or bedside chair)?: A Little Help needed to walk in hospital room?: A Little Help needed climbing 3-5 steps with a railing? : A Little 6 Click Score: 19    End of Session Equipment Utilized During Treatment: Gait belt Activity Tolerance: Patient tolerated treatment well Patient left: in bed;with call bell/phone within reach   PT Visit Diagnosis: Difficulty in walking, not elsewhere classified (R26.2);Muscle weakness (generalized) (M62.81)    Time: 1478-2956 PT Time Calculation (min) (ACUTE ONLY): 20 min   Charges:   PT Evaluation $PT Eval Low Complexity: 1 Low   PT General Charges $$ ACUTE PT VISIT: 1 Visit            Tanda Falter, PT Acute Rehabilitation  Office:  [phone]

## 2023-09-25 NOTE — Inpatient Diabetes Management (Signed)
 Inpatient Diabetes Program Recommendations  AACE/ADA: New Consensus Statement on Inpatient Glycemic Control (2015)  Target Ranges:  Prepandial:   less than 140 mg/dL      Peak postprandial:   less than 180 mg/dL (1-2 hours)      Critically ill patients:  140 - 180 mg/dL   Lab Results  Component Value Date   GLUCAP 120 (H) 12/06/2018   HGBA1C 6.4 (H) 07/11/2021    Review of Glycemic Control  Diabetes history: Prediabetes Outpatient Diabetes medications: None Current orders for Inpatient glycemic control: None  Inpatient Diabetes Program Recommendations:    Consider adding Novolog  0-9 TID  Continue to follow.  Thank you. Joni Net, RD, LDN, CDCES Inpatient Diabetes Coordinator [phone]

## 2023-09-26 DIAGNOSIS — J189 Pneumonia, unspecified organism: Secondary | ICD-10-CM | POA: Diagnosis not present

## 2023-09-26 DIAGNOSIS — I5032 Chronic diastolic (congestive) heart failure: Secondary | ICD-10-CM | POA: Diagnosis not present

## 2023-09-26 DIAGNOSIS — I10 Essential (primary) hypertension: Secondary | ICD-10-CM | POA: Diagnosis not present

## 2023-09-26 DIAGNOSIS — K219 Gastro-esophageal reflux disease without esophagitis: Secondary | ICD-10-CM | POA: Diagnosis not present

## 2023-09-26 LAB — CBC
HCT: 35.8 % — ABNORMAL LOW (ref 36.0–46.0)
Hemoglobin: 11.6 g/dL — ABNORMAL LOW (ref 12.0–15.0)
MCH: 29.8 pg (ref 26.0–34.0)
MCHC: 32.4 g/dL (ref 30.0–36.0)
MCV: 92 fL (ref 80.0–100.0)
Platelets: 274 10*3/uL (ref 150–400)
RBC: 3.89 MIL/uL (ref 3.87–5.11)
RDW: 13.8 % (ref 11.5–15.5)
WBC: 25.9 10*3/uL — ABNORMAL HIGH (ref 4.0–10.5)
nRBC: 0 % (ref 0.0–0.2)

## 2023-09-26 LAB — BASIC METABOLIC PANEL WITH GFR
Anion gap: 8 (ref 5–15)
BUN: 15 mg/dL (ref 8–23)
CO2: 22 mmol/L (ref 22–32)
Calcium: 8.4 mg/dL — ABNORMAL LOW (ref 8.9–10.3)
Chloride: 105 mmol/L (ref 98–111)
Creatinine, Ser: 0.75 mg/dL (ref 0.44–1.00)
GFR, Estimated: >60 mL/min (ref >60)
Glucose, Bld: 103 mg/dL — ABNORMAL HIGH (ref 70–99)
Potassium: 4.2 mmol/L (ref 3.5–5.1)
Sodium: 135 mmol/L (ref 135–145)

## 2023-09-26 LAB — PROCALCITONIN: Procalcitonin: 2.13 ng/mL

## 2023-09-26 LAB — GLUCOSE, CAPILLARY
Glucose-Capillary: 92 mg/dL (ref 70–99)
Glucose-Capillary: 113 mg/dL — ABNORMAL HIGH (ref 70–99)
Glucose-Capillary: 148 mg/dL — ABNORMAL HIGH (ref 70–99)
Glucose-Capillary: 118 mg/dL — ABNORMAL HIGH (ref 70–99)

## 2023-09-26 LAB — LEGIONELLA PNEUMOPHILA SEROGP 1 UR AG: L. pneumophila Serogp 1 Ur Ag: NEGATIVE

## 2023-09-26 LAB — C-REACTIVE PROTEIN: CRP: 10.8 mg/dL — ABNORMAL HIGH (ref <1.0)

## 2023-09-26 MED ORDER — GABAPENTIN 100 MG PO CAPS
100.0000 mg | ORAL_CAPSULE | Freq: Every day | ORAL | Status: DC
  Administered 2023-09-26: 100 mg via ORAL
  Filled 2023-09-26: qty 1

## 2023-09-26 MED ORDER — BENZONATATE 100 MG PO CAPS
100.0000 mg | ORAL_CAPSULE | Freq: Three times a day (TID) | ORAL | Status: DC | PRN
  Administered 2023-09-26 – 2023-09-27 (×4): 100 mg via ORAL
  Filled 2023-09-26 (×4): qty 1

## 2023-09-26 NOTE — Progress Notes (Signed)
 PROGRESS NOTE   [name]  WGN:[id number] DOB: December 16, 1928 DOA: 09/24/2023 PCP: Aldo Hun, MD   Date of Service: the patient was seen and examined on 09/26/2023  Brief Narrative:  89 y.o. female with medical history significant for hypertension, hyperlipidemia, prediabetes diet-controlled, GERD presenting to [facility] emergency department with complaints of cough brought in by EMS.  Upon evaluation the emergency department patient was felt to be suffering from severe sepsis with concurrent lactic acidosis of 3.1.  Patient was placed on intravenous antibiotics with azithromycin  and ceftriaxone .  Patient was initiated on intravenous volume resuscitation and the hospitalist group was then called to assess the patient for admission to the hospital.   Assessment & Plan Pneumonia of right lower lobe due to infectious organism Patient presenting with several days of malaise, poor oral intake and generalized weakness Chest imaging revealing: Pneumonia of the right lower lobe Patient has been placed on intravenous: Ceftriaxone  and azithromycin  Providing supplemental oxygen for any associated hypoxia Providing PRN bronchodilator therapy for any associated wheezing Blood cultures obtained Sepsis due to pneumonia [facility]) Patient presenting with right lower lobe pneumonia as an infectious source with multiple SIRS criteria Close clinical monitoring as patient is at high risk of rapid clinical decompensation See remainder of assessment and plan as above.  Lactic acidosis Lactic acidosis likely secondary to sepsis with concurrent volume depletion Hydrate patient intravenous isotonic fluids while concurrently treating underlying infection Monitoring serial lactic acid levels to ensure downtrending and resolution.  Hyponatremia Patient exhibiting mild hyponatremia, likely secondary to relative volume depletion Hydrating with intravenous isotonic fluids Monitoring sodium levels with serial  chemistries  Essential hypertension Holding home regimen of antihypertensives for now. PRN intravenous antihypertensives for excessively elevated blood pressure  GERD without esophagitis Continuing home regimen of daily PPI therapy.  Chronic diastolic CHF (congestive heart failure) (HCC) No clinical evidence of cardiogenic volume overload Strict input and output monitoring Daily weights Low-sodium diet   Subjective: Patient reports improving cough weakness and shortness of breath.  Associated poor appetite is also improving.   Physical Exam:  Vitals:   09/25/23 1934 09/26/23 0351 09/26/23 0352 09/26/23 0938  BP: 110/66 137/71 137/71 126/72  Pulse: 98 (!) 107 (!) 105 (!) 108  Resp: 20 20 20    Temp: 98.3 F (36.8 C) 97.9 F (36.6 C) 97.9 F (36.6 C)   TempSrc: Oral     SpO2: 96% 93% 93%   Weight:      Height:         Constitutional: Awake alert and oriented x3, no associated distress.   Skin: no rashes, no lesions, good skin turgor noted. Eyes: Pupils are equally reactive to light.  No evidence of scleral icterus or conjunctival pallor.  ENMT: Moist mucous membranes noted.  Posterior pharynx clear of any exudate or lesions.   Respiratory: Bibasilar rales, no evidence of wheezing.  Normal respiratory effort. No accessory muscle use.  Cardiovascular: Regular rate and rhythm, no murmurs / rubs / gallops. No extremity edema. 2+ pedal pulses. No carotid bruits.  Abdomen: Abdomen is soft and nontender.  No evidence of intra-abdominal masses.  Positive bowel sounds noted in all quadrants.   Musculoskeletal: No joint deformity upper and lower extremities. Good ROM, no contractures. Normal muscle tone.    Data Reviewed:  I have personally reviewed and interpreted labs, imaging.  Significant findings are   CBC: Recent Labs  Lab 09/24/23 1140 09/24/23 1159 09/25/23 0340 09/26/23 0402  WBC 15.5*  --  19.4* 25.9*  NEUTROABS 12.8*  --   --   --   HGB 13.4 14.6 11.3*  11.6*  HCT 38.9 43.0 35.2* 35.8*  MCV 88.4  --  93.4 92.0  PLT 249  --  229 274   Basic Metabolic Panel: Recent Labs  Lab 09/24/23 1140 09/24/23 1159 09/24/23 2045 09/25/23 0340 09/26/23 0402  NA 124* 125* 131* 134* 135  K 4.0 4.2 3.9 4.2 4.2  CL 91* 94* 104 108 105  CO2 20*  --  17* 19* 22  GLUCOSE 252* 243* 376* 223* 103*  BUN 21 19 15 13 15   CREATININE 1.01* 1.00 0.65 0.74 0.75  CALCIUM  8.6*  --  7.9* 8.0* 8.4*  MG 1.7  --   --   --   --    GFR: Estimated Creatinine Clearance: 28.3 mL/min (by C-G formula based on SCr of 0.75 mg/dL). Liver Function Tests: Recent Labs  Lab 09/24/23 1140  AST 18  ALT 20  ALKPHOS 75  BILITOT 0.7  PROT 6.8  ALBUMIN 3.1*    Coagulation Profile: Recent Labs  Lab 09/24/23 1300  INR 1.0      Code Status:  DNR.  Code status decision has been confirmed with: patient   Severity of Illness:  The appropriate patient status for this patient is INPATIENT. Inpatient status is judged to be reasonable and necessary in order to provide the required intensity of service to ensure the patient's safety. The patient's presenting symptoms, physical exam findings, and initial radiographic and laboratory data in the context of their chronic comorbidities is felt to place them at high risk for further clinical deterioration. Furthermore, it is not anticipated that the patient will be medically stable for discharge from the hospital within 2 midnights of admission.   * I certify that at the point of admission it is my clinical judgment that the patient will require inpatient hospital care spanning beyond 2 midnights from the point of admission due to high intensity of service, high risk for further deterioration and high frequency of surveillance required.*  Time spent:  50 minutes  Author:  True Fuss MD  09/26/2023 10:15 AM

## 2023-09-26 NOTE — TOC Initial Note (Signed)
 Transition of Care [facility]) - Initial/Assessment Note    Patient Details  Name: [name] MRN: [id number] Date of Birth: [birth year]  Transition of Care [facility]) CM/SW Contact:    Gertha Ku, LCSW Phone Number: 09/26/2023, 10:54 AM  Clinical Narrative:                 Csw met with pt at bedside to discuss recommendation for home health services. Pt has declined , stating she doesn't think she needs it. Pt reports she has friends that can help and if need she is able to pay for private duty aides. Pt stated she is able to arrange this on her own.  Pt stated she has a walker and cane and denies any need for additional DME. Pt reported her friend will pick her up upon d/c. TOC sign off.   Expected Discharge Plan: Home/Self Care Barriers to Discharge: Continued Medical Work up   Patient Goals and CMS Choice Patient states their goals for this hospitalization and ongoing recovery are:: return home          Expected Discharge Plan and Services       Living arrangements for the past 2 months: Single Family Home                                      Prior Living Arrangements/Services Living arrangements for the past 2 months: Single Family Home Lives with:: Self Patient language and need for interpreter reviewed:: Yes Do you feel safe going back to the place where you live?: Yes      Need for Family Participation in Patient Care: No (Comment) Care giver support system in place?: No (comment) Current home services: DME Criminal Activity/Legal Involvement Pertinent to Current Situation/Hospitalization: No - Comment as needed  Activities of Daily Living   ADL Screening (condition at time of admission) Independently performs ADLs?: Yes (appropriate for developmental age) Is the patient deaf or have difficulty hearing?: Yes Does the patient have difficulty seeing, even when wearing glasses/contacts?: No Does the patient have difficulty concentrating, remembering,  or making decisions?: No  Permission Sought/Granted                  Emotional Assessment Appearance:: Appears stated age Attitude/Demeanor/Rapport: Gracious Affect (typically observed): Accepting Orientation: : Oriented to Self, Oriented to Place, Oriented to  Time, Oriented to Situation   Psych Involvement: No (comment)  Admission diagnosis:  Hyponatremia [E87.1] CAP (community acquired pneumonia) [J18.9] Acute respiratory failure with hypoxia (HCC) [J96.01] Pneumonia of right lower lobe due to infectious organism [J18.9] Patient Active Problem List   Diagnosis Date Noted   CAP (community acquired pneumonia) 09/24/2023   PAD (peripheral artery disease) (HCC) 08/08/2022   Gait abnormality 10/06/2021   Thoracic myelopathy 08/26/2021   Diastolic dysfunction 07/12/2021   Lower extremity weakness 07/07/2021   COVID-19 vaccine administered 07/06/2021   Spasm 07/06/2021   Left leg weakness 07/05/2021   Small bowel obstruction (HCC) 12/02/2018   Malnutrition of moderate degree 12/25/2017   Diverticulitis of large intestine with abscess 12/19/2017   Sepsis (HCC) 12/19/2017   HTN (hypertension) 12/19/2017   History of prediabetes 12/19/2017   GERD (gastroesophageal reflux disease) 12/19/2017   Anxiety 12/19/2017   Acute renal failure superimposed on stage 2 chronic kidney disease (HCC) 12/19/2017   Type II diabetes mellitus with renal manifestations (HCC) 12/19/2017   Pancreatic cyst 12/19/2017   Hyperglycemia  Aortic systolic murmur on examination 04/08/2015   Malaise and fatigue 04/15/2012   Right carotid bruit 10/05/2011   Hiatal hernia 04/05/2011   Benign hypertensive heart disease without heart failure 09/01/2010   Hypercholesterolemia 09/01/2010   History of breast cancer 09/01/2010   Osteopenia 09/01/2010   PCP:  Aldo Hun, MD Pharmacy:   [facility] [phone] - , [place] - [address] AT [facility] [address] & [address] [place] [address] Phone: [phone] Fax: [phone]     Social Drivers of Health (SDOH) Social History: SDOH Screenings   Food Insecurity: No Food Insecurity (09/24/2023)  Housing: Low Risk  (09/24/2023)  Transportation Needs: No Transportation Needs (09/24/2023)  Utilities: Not At Risk (09/24/2023)  Social Connections: Moderately Integrated (09/24/2023)  Tobacco Use: Low Risk  (09/24/2023)   SDOH Interventions:     Readmission Risk Interventions     No data to display

## 2023-09-26 NOTE — Plan of Care (Signed)

## 2023-09-26 NOTE — Hospital Course (Addendum)
 89 y.o. female with medical history significant for hypertension, hyperlipidemia, prediabetes diet-controlled, GERD presenting to [facility] emergency department with complaints of cough brought in by EMS.  Upon evaluation the emergency department patient was felt to be suffering from severe sepsis with concurrent lactic acidosis of 3.1.  Patient was placed on intravenous antibiotics with azithromycin  and ceftriaxone .  Patient was initiated on intravenous volume resuscitation and the hospitalist group was then called to assess the patient for admission to the hospital.  The days that followed patient gradually clinically improved.  Lactic acidosis resolved and leukocytosis gradually downtrended.  Patient's symptoms of generalized weakness cough and shortness of breath additionally began to improve.   Patient's associated oxygen requirements began to improve and patient was eventually weaned off of supplemental oxygen.  Patient was evaluated by physical therapy and it was felt that patient would benefit from skilled physical therapy services via home health.  Prior to discharge arranges were made for patient to receive home health physical therapy services and an aide.  Patient was discharged home in improved and stable condition with the remaining course of her oral antibiotic therapy on 09/27/2023.

## 2023-09-26 NOTE — Progress Notes (Signed)
 Occupational Therapy Treatment Patient Details Name: [name] MRN: [id number] DOB: Jan 21, 1929 Today's Date: 09/26/2023   History of present illness 89 yr old female admitted with CAP, and sepsis. PMH: HTN, GERD, fibromyalgia, osteoporosis, breast cancer s/p mastectomy, COVID, osteopenia.   OT comments  The pt was pleasant and motivated to participate in the session. She was seen for progression of functional activity, performing lower body dressing seated in the chair/doffing and donning socks, grooming in standing at the sink, and a toilet transfer at bathroom level with overall stand-by assist. She is making gradual functional progress. Continue OT plan of care. Home health OT recommended.       If plan is discharge home, recommend the following:  A little help with bathing/dressing/bathroom;Assistance with cooking/housework;Assist for transportation;Direct supervision/assist for medications management   Equipment Recommendations  None recommended by OT    Recommendations for Other Services      Precautions / Restrictions Restrictions Weight Bearing Restrictions Per Provider Order: No       Mobility Bed Mobility      General bed mobility comments: pt was received seated in the bedside chair    Transfers Overall transfer level: Needs assistance Equipment used: Rolling walker (2 wheels) Transfers: Sit to/from Stand Sit to Stand: Supervision                 Balance     Sitting balance-Leahy Scale: Good       Standing balance-Leahy Scale: Fair                ADL either performed or assessed with clinical judgement   ADL       Grooming: Supervision/safety;Set up;Standing Grooming Details (indicate cue type and reason): She performed hand washing in standing at sink level.             Lower Body Dressing: Supervision/safety;Set up Lower Body Dressing Details (indicate cue type and reason): The pt doffed then donned her socks while seated in the  bedside chair. She demonstrated the figure four technique to perform. Toilet Transfer: Supervision/safety;Set up;Rolling walker (2 wheels);Grab bars;Ambulation Toilet Transfer Details (indicate cue type and reason): She ambulated to and from the bathroom in her room using a RW. She performed transfer onto and off the toilet, using the grab bar for added support.                          Communication Communication Factors Affecting Communication: Hearing impaired   Cognition Arousal: Alert Behavior During Therapy: WFL for tasks assessed/performed Cognition: No apparent impairments      Following commands: Intact                      Pertinent Vitals/ Pain       Pain Assessment Pain Assessment: No/denies pain   Frequency  Min 2X/week        Progress Toward Goals  OT Goals(current goals can now be found in the care plan section)  Progress towards OT goals: Progressing toward goals  Acute Rehab OT Goals Patient Stated Goal: to get better and return home OT Goal Formulation: With patient Time For Goal Achievement: 10/09/23 Potential to Achieve Goals: Good  Plan         AM-PAC OT "6 Clicks" Daily Activity     Outcome Measure   Help from another person eating meals?: None Help from another person taking care of personal grooming?: A Little Help from another person  toileting, which includes using toliet, bedpan, or urinal?: A Little Help from another person bathing (including washing, rinsing, drying)?: A Little Help from another person to put on and taking off regular upper body clothing?: A Little Help from another person to put on and taking off regular lower body clothing?: A Little 6 Click Score: 19    End of Session Equipment Utilized During Treatment: Rolling walker (2 wheels)  OT Visit Diagnosis: Unsteadiness on feet (R26.81);Muscle weakness (generalized) (M62.81)   Activity Tolerance Patient tolerated treatment well   Patient Left in  chair;with call bell/phone within reach;with chair alarm set   Nurse Communication Other (comment) (nurse cleared the pt for therapy participation)        Time: 4782-9562 OT Time Calculation (min): 25 min  Charges: OT General Charges $OT Visit: 1 Visit OT Treatments $Self Care/Home Management : 8-22 mins $Therapeutic Activity: 8-22 mins     Sheralyn Dies, OTR/L 09/26/2023, 1:12 PM

## 2023-09-27 ENCOUNTER — Other Ambulatory Visit (HOSPITAL_COMMUNITY): Payer: Self-pay

## 2023-09-27 DIAGNOSIS — I5032 Chronic diastolic (congestive) heart failure: Secondary | ICD-10-CM | POA: Diagnosis not present

## 2023-09-27 DIAGNOSIS — K219 Gastro-esophageal reflux disease without esophagitis: Secondary | ICD-10-CM | POA: Diagnosis not present

## 2023-09-27 DIAGNOSIS — J189 Pneumonia, unspecified organism: Secondary | ICD-10-CM | POA: Diagnosis not present

## 2023-09-27 DIAGNOSIS — I10 Essential (primary) hypertension: Secondary | ICD-10-CM | POA: Diagnosis not present

## 2023-09-27 DIAGNOSIS — E872 Acidosis, unspecified: Secondary | ICD-10-CM

## 2023-09-27 LAB — CBC WITH DIFFERENTIAL/PLATELET
Abs Immature Granulocytes: 0.41 10*3/uL — ABNORMAL HIGH (ref 0.00–0.07)
Basophils Absolute: 0.1 10*3/uL (ref 0.0–0.1)
Basophils Relative: 1 %
Eosinophils Absolute: 0.1 10*3/uL (ref 0.0–0.5)
Eosinophils Relative: 1 %
HCT: 37.2 % (ref 36.0–46.0)
Hemoglobin: 12.4 g/dL (ref 12.0–15.0)
Immature Granulocytes: 2 %
Lymphocytes Relative: 9 %
Lymphs Abs: 1.6 10*3/uL (ref 0.7–4.0)
MCH: 30 pg (ref 26.0–34.0)
MCHC: 33.3 g/dL (ref 30.0–36.0)
MCV: 89.9 fL (ref 80.0–100.0)
Monocytes Absolute: 1.1 10*3/uL — ABNORMAL HIGH (ref 0.1–1.0)
Monocytes Relative: 6 %
Neutro Abs: 14.9 10*3/uL — ABNORMAL HIGH (ref 1.7–7.7)
Neutrophils Relative %: 81 %
Platelets: 298 10*3/uL (ref 150–400)
RBC: 4.14 MIL/uL (ref 3.87–5.11)
RDW: 13.8 % (ref 11.5–15.5)
WBC: 18.1 10*3/uL — ABNORMAL HIGH (ref 4.0–10.5)
nRBC: 0 % (ref 0.0–0.2)

## 2023-09-27 LAB — COMPREHENSIVE METABOLIC PANEL WITH GFR
ALT: 24 U/L (ref 0–44)
AST: 19 U/L (ref 15–41)
Albumin: 2.6 g/dL — ABNORMAL LOW (ref 3.5–5.0)
Alkaline Phosphatase: 70 U/L (ref 38–126)
Anion gap: 13 (ref 5–15)
BUN: 14 mg/dL (ref 8–23)
CO2: 20 mmol/L — ABNORMAL LOW (ref 22–32)
Calcium: 8.3 mg/dL — ABNORMAL LOW (ref 8.9–10.3)
Chloride: 99 mmol/L (ref 98–111)
Creatinine, Ser: 0.7 mg/dL (ref 0.44–1.00)
GFR, Estimated: >60 mL/min (ref >60)
Glucose, Bld: 85 mg/dL (ref 70–99)
Potassium: 3.5 mmol/L (ref 3.5–5.1)
Sodium: 132 mmol/L — ABNORMAL LOW (ref 135–145)
Total Bilirubin: 0.7 mg/dL (ref 0.0–1.2)
Total Protein: 5.9 g/dL — ABNORMAL LOW (ref 6.5–8.1)

## 2023-09-27 LAB — GLUCOSE, CAPILLARY
Glucose-Capillary: 91 mg/dL (ref 70–99)
Glucose-Capillary: 97 mg/dL (ref 70–99)

## 2023-09-27 LAB — MAGNESIUM: Magnesium: 2 mg/dL (ref 1.7–2.4)

## 2023-09-27 LAB — PROCALCITONIN: Procalcitonin: 1.34 ng/mL

## 2023-09-27 MED ORDER — RALOXIFENE HCL 60 MG PO TABS: 60.0000 mg | ORAL_TABLET | Freq: Every day | ORAL | Status: DC

## 2023-09-27 MED ORDER — AZITHROMYCIN 250 MG PO TABS: 500.0000 mg | ORAL_TABLET | Freq: Every day | ORAL | Status: DC

## 2023-09-27 MED ORDER — ADULT MULTIVITAMIN W/MINERALS CH: 1.0000 | ORAL_TABLET | Freq: Every day | ORAL | Status: DC

## 2023-09-27 MED ORDER — EZETIMIBE 10 MG PO TABS: 10.0000 mg | ORAL_TABLET | Freq: Every day | ORAL | Status: DC

## 2023-09-27 MED ORDER — PANTOPRAZOLE SODIUM 40 MG PO TBEC
40.0000 mg | DELAYED_RELEASE_TABLET | Freq: Every day | ORAL | 0 refills | Status: AC
  Filled 2023-09-27: qty 90, 90d supply, fill #0

## 2023-09-27 MED ORDER — AMLODIPINE BESYLATE 5 MG PO TABS: 2.5000 mg | ORAL_TABLET | Freq: Every day | ORAL | Status: DC

## 2023-09-27 MED ORDER — CEFDINIR 300 MG PO CAPS
300.0000 mg | ORAL_CAPSULE | Freq: Two times a day (BID) | ORAL | 0 refills | Status: AC
  Filled 2023-09-27: qty 8, 4d supply, fill #0

## 2023-09-27 MED ORDER — DOXYCYCLINE HYCLATE 100 MG PO CAPS
100.0000 mg | ORAL_CAPSULE | Freq: Two times a day (BID) | ORAL | 0 refills | Status: AC
  Filled 2023-09-27: qty 8, 4d supply, fill #0

## 2023-09-27 MED ORDER — ASPIRIN 325 MG PO TBEC: 325.0000 mg | DELAYED_RELEASE_TABLET | Freq: Every day | ORAL | Status: DC

## 2023-09-27 MED ORDER — GABAPENTIN 100 MG PO CAPS: 100.0000 mg | ORAL_CAPSULE | Freq: Every day | ORAL | Status: DC

## 2023-09-27 MED ORDER — VITAMIN D3 25 MCG (1000 UNIT) PO TABS: 1000.0000 [IU] | ORAL_TABLET | Freq: Every day | ORAL | Status: DC

## 2023-09-27 MED ORDER — BENZONATATE 100 MG PO CAPS
100.0000 mg | ORAL_CAPSULE | Freq: Four times a day (QID) | ORAL | 0 refills | Status: AC | PRN
  Filled 2023-09-27: qty 30, 8d supply, fill #0

## 2023-09-27 NOTE — Progress Notes (Signed)
 Physical Therapy Treatment Patient Details Name: [name] MRN: [id number] DOB: [birth year] Today's Date: 09/27/2023   History of Present Illness 89 yo female admitted with CAP/sepsis. Hx of HTN, GERD, fibromyalgia, osteoporosis, breast cancer s/p mastectomy, COVID, osteopenia.    PT Comments  AxO x 3 sweet Lady.  Motivated.  Very HOH.  Already OOB in recliner.  Assisted with amb in hallway.General Gait Details: tolerated a functional distance using walker for light support.  Slightly forward flexed posture,  Avg RA 98%. General transfer comment: good safety cognition and awareness Pt plans to return home.  LPT has rec HH PT.    If plan is discharge home, recommend the following: A little help with walking and/or transfers;A little help with bathing/dressing/bathroom;Assistance with cooking/housework;Assist for transportation;Help with stairs or ramp for entrance   Can travel by private vehicle        Equipment Recommendations  None recommended by PT    Recommendations for Other Services       Precautions / Restrictions Precautions Precautions: None Precaution/Restrictions Comments: monitor HR, O2 Restrictions Weight Bearing Restrictions Per Provider Order: No     Mobility  Bed Mobility               General bed mobility comments: Pt OOB in recliner    Transfers Overall transfer level: Needs assistance Equipment used: Rolling walker (2 wheels) Transfers: Sit to/from Stand Sit to Stand: Supervision           General transfer comment: good safety cognition and awareness    Ambulation/Gait Ambulation/Gait assistance: Supervision Gait Distance (Feet): 155 Feet Assistive device: Rolling walker (2 wheels) Gait Pattern/deviations: Step-through pattern, Decreased stride length Gait velocity: decreased     General Gait Details: tolerated a functional distance using walker for light support.  Slightly forward flexed posture,  Avg RA 98%.   Stairs              Wheelchair Mobility     Tilt Bed    Modified Rankin (Stroke Patients Only)       Balance                                            Communication Communication Communication: No apparent difficulties Factors Affecting Communication: Hearing impaired  Cognition Arousal: Alert Behavior During Therapy: WFL for tasks assessed/performed                           PT - Cognition Comments: AxO x 3 pleasant Lady.  Motivated.  Very HOH Following commands: Intact      Cueing Cueing Techniques: Verbal cues  Exercises      General Comments        Pertinent Vitals/Pain Pain Assessment Pain Assessment: No/denies pain    Home Living                          Prior Function            PT Goals (current goals can now be found in the care plan section) Progress towards PT goals: Progressing toward goals    Frequency    Min 3X/week      PT Plan      Co-evaluation              AM-PAC PT "6 Clicks" Mobility  Outcome Measure  Help needed turning from your back to your side while in a flat bed without using bedrails?: None Help needed moving from lying on your back to sitting on the side of a flat bed without using bedrails?: None Help needed moving to and from a bed to a chair (including a wheelchair)?: None Help needed standing up from a chair using your arms (e.g., wheelchair or bedside chair)?: None Help needed to walk in hospital room?: A Little Help needed climbing 3-5 steps with a railing? : A Little 6 Click Score: 22    End of Session Equipment Utilized During Treatment: Gait belt Activity Tolerance: Patient tolerated treatment well Patient left: in chair;with call bell/phone within reach;with chair alarm set Nurse Communication: Mobility status PT Visit Diagnosis: Difficulty in walking, not elsewhere classified (R26.2);Muscle weakness (generalized) (M62.81)     Time: 1610-9604 PT Time Calculation  (min) (ACUTE ONLY): 17 min  Charges:    $Gait Training: 8-22 mins PT General Charges $$ ACUTE PT VISIT: 1 Visit                     Bess Broody  PTA Acute  Rehabilitation Services Office M-F          [phone]

## 2023-09-27 NOTE — TOC Transition Note (Signed)
 Transition of Care [facility]) - Discharge Note  Patient Details  Name: [name] MRN: [id number] Date of Birth: [birth year]  Transition of [facility]) CM/SW Contact:  Zenon Hilda, LCSW Phone Number: 09/27/2023, 3:42 PM  Clinical Narrative: Patient will need taxi home. [facility]. Taxi voucher provided to [employer]. TOC signing off.  Final next level of care: Home/Self Care Barriers to Discharge: Barriers Resolved  Patient Goals and CMS Choice Patient states their goals for this hospitalization and ongoing recovery are:: return home Choice offered to / list presented to : NA  Discharge Plan and Services Additional resources added to the After Visit Summary for         DME Arranged: N/A DME Agency: NA  Social Drivers of Health (SDOH) Interventions SDOH Screenings   Food Insecurity: No Food Insecurity (09/24/2023)  Housing: Low Risk  (09/24/2023)  Transportation Needs: No Transportation Needs (09/24/2023)  Utilities: Not At Risk (09/24/2023)  Social Connections: Moderately Integrated (09/24/2023)  Tobacco Use: Low Risk  (09/24/2023)   Readmission Risk Interventions     No data to display

## 2023-09-27 NOTE — Discharge Instructions (Addendum)
 Please take all prescribed medications exactly as instructed.. Please consume a low sodium diet diet Please increase your physical activity as tolerated using a cane or walker. Please maintain all outpatient follow-up appointments including follow-up with your primary care provider. Please return to the emergency department if you develop worsening shortness of breath, fevers in excess of 100.4 F, weakness or inability to tolerate oral intake. A referral for home health aide and nursing have been placed for you.  You should be contacted in the next several days to establish care.

## 2023-09-29 LAB — CULTURE, BLOOD (ROUTINE X 2)
Culture: NO GROWTH
Culture: NO GROWTH

## 2023-10-01 DIAGNOSIS — E872 Acidosis, unspecified: Secondary | ICD-10-CM | POA: Insufficient documentation

## 2023-10-01 DIAGNOSIS — I5032 Chronic diastolic (congestive) heart failure: Secondary | ICD-10-CM

## 2023-10-01 DIAGNOSIS — E871 Hypo-osmolality and hyponatremia: Secondary | ICD-10-CM | POA: Insufficient documentation

## 2023-10-01 NOTE — Assessment & Plan Note (Signed)
   Patient exhibiting mild hyponatremia, likely secondary to relative volume depletion  Hydrating with intravenous isotonic fluids  Monitoring sodium levels with serial chemistries 

## 2023-10-01 NOTE — Assessment & Plan Note (Signed)
 Holding home regimen of antihypertensives for now. PRN intravenous antihypertensives for excessively elevated blood pressure

## 2023-10-01 NOTE — Assessment & Plan Note (Signed)
 Patient presenting with several days of malaise, poor oral intake and generalized weakness Chest imaging revealing: Pneumonia of the right lower lobe Patient has been placed on intravenous: Ceftriaxone  and azithromycin  Providing supplemental oxygen for any associated hypoxia Providing PRN bronchodilator therapy for any associated wheezing Blood cultures obtained

## 2023-10-01 NOTE — Discharge Summary (Addendum)
 Physician Discharge Summary   Patient: [name] MRN: [id number] DOB: 13-Jul-1928  Admit date:     09/24/2023  Discharge date: 09/27/2023  Discharge Physician: True Fuss   PCP: Aldo Hun, MD   Recommendations at discharge:   Please take all prescribed medications exactly as instructed.. Please consume a low sodium diet diet Please increase your physical activity as tolerated using a cane or walker. Please maintain all outpatient follow-up appointments including follow-up with your primary care provider. Please return to the emergency department if you develop worsening shortness of breath, fevers in excess of 100.4 F, weakness or inability to tolerate oral intake. A referral for home health aide and nursing have been placed for you.  You should be contacted in the next several days to establish care.  Discharge Diagnoses: Principal Problem:   Pneumonia of right lower lobe due to infectious organism Active Problems:   Sepsis due to pneumonia [facility])   Essential hypertension   GERD without esophagitis   Hyponatremia   Chronic diastolic CHF (congestive heart failure) (HCC)   Lactic acidosis    Underweight  Resolved Problems:   * No resolved hospital problems. *   Hospital Course: 89 y.o. female with medical history significant for hypertension, hyperlipidemia, prediabetes diet-controlled, GERD presenting to [facility] emergency department with complaints of cough brought in by EMS.  Upon evaluation the emergency department patient was felt to be suffering from severe sepsis with concurrent lactic acidosis of 3.1.  Patient was placed on intravenous antibiotics with azithromycin  and ceftriaxone .  Patient was initiated on intravenous volume resuscitation and the hospitalist group was then called to assess the patient for admission to the hospital.  The days that followed patient gradually clinically improved.  Lactic acidosis resolved and leukocytosis gradually  downtrended.  Patient's symptoms of generalized weakness cough and shortness of breath additionally began to improve.   Patient's associated oxygen requirements began to improve and patient was eventually weaned off of supplemental oxygen.  Patient was evaluated by physical therapy and it was felt that patient would benefit from skilled physical therapy services via home health.  Prior to discharge arranges were made for patient to receive home health physical therapy services and an aide.  Patient was discharged home in improved and stable condition with the remaining course of her oral antibiotic therapy on 09/27/2023.      Consultants: none Procedures performed: none Disposition: Home Diet recommendation:  Discharge Diet Orders (From admission, onward)     Start     Ordered   09/27/23 0000  Diet - low sodium heart healthy        09/27/23 1449           Cardiac diet  DISCHARGE MEDICATION: Allergies as of 09/27/2023       Reactions   Cellcept  [mycophenolate ] Other (See Comments)   dizziness, worsening weakness, depressed, just "did not feel right".   Covid-19 (mrna Bivalent) Vaccine Proofreader) [covid-19 (mrna) Vaccine]    Partial paralysis         Medication List     STOP taking these medications    doxycycline  100 MG tablet Commonly known as: VIBRA -TABS Replaced by: doxycycline  100 MG capsule   omeprazole  20 MG capsule Commonly known as: PRILOSEC Replaced by: pantoprazole  40 MG tablet       TAKE these medications    ALPRAZolam  0.25 MG tablet Commonly known as: XANAX  Take 0.25 mg by mouth at bedtime.   amLODipine  5 MG tablet Commonly known as:  NORVASC  Take 0.5 tablets (2.5 mg total) by mouth daily. What changed: how much to take   aspirin  EC 325 MG tablet Take 325 mg by mouth daily.   benzonatate  100 MG capsule Commonly known as: Tessalon  Perles Take 1 capsule (100 mg total) by mouth every 6 (six) hours as needed for cough.   cefdinir  300 MG  capsule Commonly known as: OMNICEF  Take 1 capsule (300 mg total) by mouth 2 (two) times daily for 4 days.   Co Q-10 200 MG Caps Take 1 capsule by mouth daily.   doxycycline  100 MG capsule Commonly known as: VIBRAMYCIN  Take 1 capsule (100 mg total) by mouth 2 (two) times daily for 4 days. Replaces: doxycycline  100 MG tablet   ezetimibe  10 MG tablet Commonly known as: ZETIA  Take 10 mg by mouth daily.   gabapentin  100 MG capsule Commonly known as: NEURONTIN  Take 1 capsule (100 mg total) by mouth at bedtime.   metoprolol  succinate 25 MG 24 hr tablet Commonly known as: TOPROL -XL TAKE 1/2 TABLET BY MOUTH DAILY   multivitamin tablet Take 1 tablet by mouth daily.   pantoprazole  40 MG tablet Commonly known as: PROTONIX  Take 1 tablet (40 mg total) by mouth daily. Replaces: omeprazole  20 MG capsule   raloxifene  60 MG tablet Commonly known as: EVISTA  TAKE 1 TABLET BY MOUTH ONCE DAILY   solifenacin 5 MG tablet Commonly known as: VESICARE Take 5 mg by mouth daily as needed.   Vitamin D  1000 units capsule Take 1,000 Units by mouth daily.        Follow-up Information     Aldo Hun, MD. Schedule an appointment as soon as possible for a visit in 1 week(s).   Specialty: Internal Medicine Contact information: [address] [place] [address] [phone]                 Discharge Exam: Cleavon Curls Weights   09/24/23 1141  Weight: 39.9 kg    Constitutional: Awake alert and oriented x3, no associated distress.   Respiratory: clear to auscultation bilaterally, no wheezing, no crackles. Normal respiratory effort. No accessory muscle use.  Cardiovascular: Regular rate and rhythm, no murmurs / rubs / gallops. No extremity edema. 2+ pedal pulses. No carotid bruits.  Abdomen: Abdomen is soft and nontender.  No evidence of intra-abdominal masses.  Positive bowel sounds noted in all quadrants.   Musculoskeletal: No joint deformity upper and lower extremities. Good ROM,  no contractures. Normal muscle tone.     Condition at discharge: fair  The results of significant diagnostics from this hospitalization (including imaging, microbiology, ancillary and laboratory) are listed below for reference.   Imaging Studies: DG Chest Port 1 View Result Date: 09/24/2023 CLINICAL DATA:  Cough EXAM: PORTABLE CHEST 1 VIEW COMPARISON:  07/05/2021 FINDINGS: Heart and mediastinal contours within normal limits. Moderate-sized hiatal hernia. Left lung clear. Airspace opacity in the right lower lobe concerning for pneumonia. No effusions or acute bony abnormality. IMPRESSION: Right lower lobe airspace opacity concerning for pneumonia. Hiatal hernia. Electronically Signed   By: Janeece Mechanic M.D.   On: 09/24/2023 13:17    Microbiology: Results for orders placed or performed during the hospital encounter of 09/24/23  Resp panel by RT-PCR (RSV, Flu A&B, Covid) Anterior Nasal Swab     Status: None   Collection Time: 09/24/23 11:54 AM   Specimen: Anterior Nasal Swab  Result Value Ref Range Status   SARS Coronavirus 2 by RT PCR NEGATIVE NEGATIVE Final    Comment: (NOTE) SARS-CoV-2  target nucleic acids are NOT DETECTED.  The SARS-CoV-2 RNA is generally detectable in upper respiratory specimens during the acute phase of infection. The lowest concentration of SARS-CoV-2 viral copies this assay can detect is 138 copies/mL. A negative result does not preclude SARS-Cov-2 infection and should not be used as the sole basis for treatment or other patient management decisions. A negative result may occur with  improper specimen collection/handling, submission of specimen other than nasopharyngeal swab, presence of viral mutation(s) within the areas targeted by this assay, and inadequate number of viral copies(<138 copies/mL). A negative result must be combined with clinical observations, patient history, and epidemiological information. The expected result is Negative.  Fact Sheet for  Patients:  BloggerCourse.com  Fact Sheet for Healthcare Providers:  SeriousBroker.it  This test is no t yet approved or cleared by the [place]  FDA and  has been authorized for detection and/or diagnosis of SARS-CoV-2 by FDA under an Emergency Use Authorization (EUA). This EUA will remain  in effect (meaning this test can be used) for the duration of the COVID-19 declaration under Section 564(b)(1) of the Act, 21 U.S.C.section 360bbb-3(b)(1), unless the authorization is terminated  or revoked sooner.       Influenza A by PCR NEGATIVE NEGATIVE Final   Influenza B by PCR NEGATIVE NEGATIVE Final    Comment: (NOTE) The Xpert Xpress SARS-CoV-2/FLU/RSV plus assay is intended as an aid in the diagnosis of influenza from Nasopharyngeal swab specimens and should not be used as a sole basis for treatment. Nasal washings and aspirates are unacceptable for Xpert Xpress SARS-CoV-2/FLU/RSV testing.  Fact Sheet for Patients: BloggerCourse.com  Fact Sheet for Healthcare Providers: SeriousBroker.it  This test is not yet approved or cleared by the [place]  FDA and has been authorized for detection and/or diagnosis of SARS-CoV-2 by FDA under an Emergency Use Authorization (EUA). This EUA will remain in effect (meaning this test can be used) for the duration of the COVID-19 declaration under Section 564(b)(1) of the Act, 21 U.S.C. section 360bbb-3(b)(1), unless the authorization is terminated or revoked.     Resp Syncytial Virus by PCR NEGATIVE NEGATIVE Final    Comment: (NOTE) Fact Sheet for Patients: BloggerCourse.com  Fact Sheet for Healthcare Providers: SeriousBroker.it  This test is not yet approved or cleared by the [place]  FDA and has been authorized for detection and/or diagnosis of SARS-CoV-2 by FDA under an Emergency  Use Authorization (EUA). This EUA will remain in effect (meaning this test can be used) for the duration of the COVID-19 declaration under Section 564(b)(1) of the Act, 21 U.S.C. section 360bbb-3(b)(1), unless the authorization is terminated or revoked.  Performed at [facility], [address], [place], [place] [address]   Blood Culture (routine x 2)     Status: None   Collection Time: 09/24/23  1:00 PM   Specimen: BLOOD RIGHT HAND  Result Value Ref Range Status   Specimen Description   Final    BLOOD RIGHT HAND Performed at [facility], [address], [place], [place] [address]    Special Requests   Final    BOTTLES DRAWN AEROBIC AND ANAEROBIC Blood Culture results may not be optimal due to an inadequate volume of blood received in culture bottles Performed at [facility], [address] [place], [place] [address]    Culture   Final    NO GROWTH 5 DAYS Performed at [facility], [address], [place], [place] [address]  Report Status 09/29/2023 FINAL  Final  Blood Culture (routine x 2)     Status: None   Collection Time: 09/24/23  1:06 PM   Specimen: BLOOD  Result Value Ref Range Status   Specimen Description   Final    BLOOD RIGHT ANTECUBITAL Performed at [facility], [address] [place], [place] [address]    Special Requests   Final    BOTTLES DRAWN AEROBIC AND ANAEROBIC Blood Culture results may not be optimal due to an inadequate volume of blood received in culture bottles Performed at [facility], [address], [place], [place] [address]    Culture   Final    NO GROWTH 5 DAYS Performed at [facility], [address], [place], [place] [address]    Report Status 09/29/2023 FINAL  Final    Labs: CBC: Recent Labs  Lab 09/24/23 1140 09/24/23 1159 09/25/23 0340 09/26/23 0402 09/27/23 0408  WBC 15.5*  --  19.4* 25.9* 18.1*  NEUTROABS 12.8*  --   --    --  14.9*  HGB 13.4 14.6 11.3* 11.6* 12.4  HCT 38.9 43.0 35.2* 35.8* 37.2  MCV 88.4  --  93.4 92.0 89.9  PLT 249  --  229 274 298   Basic Metabolic Panel: Recent Labs  Lab 09/24/23 1140 09/24/23 1159 09/24/23 2045 09/25/23 0340 09/26/23 0402 09/27/23 0408  NA 124* 125* 131* 134* 135 132*  K 4.0 4.2 3.9 4.2 4.2 3.5  CL 91* 94* 104 108 105 99  CO2 20*  --  17* 19* 22 20*  GLUCOSE 252* 243* 376* 223* 103* 85  BUN 21 19 15 13 15 14   CREATININE 1.01* 1.00 0.65 0.74 0.75 0.70  CALCIUM  8.6*  --  7.9* 8.0* 8.4* 8.3*  MG 1.7  --   --   --   --  2.0   Liver Function Tests: Recent Labs  Lab 09/24/23 1140 09/27/23 0408  AST 18 19  ALT 20 24  ALKPHOS 75 70  BILITOT 0.7 0.7  PROT 6.8 5.9*  ALBUMIN 3.1* 2.6*   CBG: Recent Labs  Lab 09/26/23 1140 09/26/23 1606 09/26/23 2048 09/27/23 0734 09/27/23 1158  GLUCAP 113* 148* 118* 91 97    Discharge time spent: greater than 30 minutes.  Signed: True Fuss, MD [facility] 10/01/2023

## 2023-10-01 NOTE — Assessment & Plan Note (Signed)
Continuing home regimen of daily PPI therapy.  

## 2023-10-01 NOTE — Assessment & Plan Note (Signed)
 Patient presenting with right lower lobe pneumonia as an infectious source with multiple SIRS criteria Close clinical monitoring as patient is at high risk of rapid clinical decompensation See remainder of assessment and plan as above.

## 2023-10-01 NOTE — Assessment & Plan Note (Signed)
No clinical evidence of cardiogenic volume overload Strict input and output monitoring Daily weights Low-sodium diet  

## 2023-10-01 NOTE — Assessment & Plan Note (Signed)
 Lactic acidosis likely secondary to sepsis with concurrent volume depletion Repeat lactic acid today reveals persisting mild lactic acidosis Hydrate patient intravenous isotonic fluids while concurrently treating underlying infection Monitoring serial lactic acid levels to ensure downtrending and resolution.

## 2023-11-09 ENCOUNTER — Ambulatory Visit (INDEPENDENT_AMBULATORY_CARE_PROVIDER_SITE_OTHER): Admitting: Podiatry

## 2023-11-09 DIAGNOSIS — M79675 Pain in left toe(s): Secondary | ICD-10-CM

## 2023-11-09 DIAGNOSIS — M79674 Pain in right toe(s): Secondary | ICD-10-CM

## 2023-11-09 DIAGNOSIS — B351 Tinea unguium: Secondary | ICD-10-CM

## 2023-11-09 NOTE — Progress Notes (Unsigned)
 Subjective: Chief Complaint  Patient presents with   RFC    RM#[id number] RFC     89 year old female presents the office today for concerns of thick, elongated nails that she is unable to trim them as they become ingrown and they cause discomfort. She does not report any ulcerations. No new concerns to her feet today.   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  There is well-healed. No open lesions noted today.  Nails are hypertrophic, dystrophic and brittle.  There is mild yellow discoloration of the nails.  There is incurvation present to the hallux toenails left side worse than the right.  She gets tenderness nails 1-5 bilaterally except the left 2nd toe which has been amputated.  There is no drainage or pus or signs of infection today. Digital contractures present. No pain with calf compression, swelling, warmth, erythema  Assessment: Symptomatic onychomycosis, history of amputation  Plan: -All treatment options discussed with the patient including all alternatives, risks, complications.  -Separate debrided the nails x 9 without any complications or bleeding.   -Continue gabapentin  for neuropathy. -Dispensed toe spacers.  Charity Conch DPM

## 2024-01-11 ENCOUNTER — Ambulatory Visit: Admitting: Podiatry

## 2024-01-15 ENCOUNTER — Encounter: Payer: Self-pay | Admitting: Podiatry

## 2024-01-15 ENCOUNTER — Ambulatory Visit: Admitting: Podiatry

## 2024-01-15 DIAGNOSIS — L84 Corns and callosities: Secondary | ICD-10-CM | POA: Diagnosis not present

## 2024-01-15 DIAGNOSIS — B351 Tinea unguium: Secondary | ICD-10-CM | POA: Diagnosis not present

## 2024-01-15 DIAGNOSIS — M79675 Pain in left toe(s): Secondary | ICD-10-CM | POA: Diagnosis not present

## 2024-01-15 DIAGNOSIS — M79674 Pain in right toe(s): Secondary | ICD-10-CM | POA: Diagnosis not present

## 2024-01-15 DIAGNOSIS — M2042 Other hammer toe(s) (acquired), left foot: Secondary | ICD-10-CM | POA: Diagnosis not present

## 2024-01-15 DIAGNOSIS — M2041 Other hammer toe(s) (acquired), right foot: Secondary | ICD-10-CM | POA: Diagnosis not present

## 2024-01-15 DIAGNOSIS — M21619 Bunion of unspecified foot: Secondary | ICD-10-CM

## 2024-01-15 NOTE — Progress Notes (Unsigned)
 Subjective: Chief Complaint  Patient presents with   RFC    RFC Non diabetic toenail trim. 4 pain on the left foot distal toe 3 and 4 x 1 year. Using Voltaren  gel for pain.     89 year old female presents the office today for concerns of thick, elongated nails that she is unable to trim them as they become ingrown and they cause discomfort. She does not report any ulcerations.  She does use toe spacers.  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  There is well-healed. No open lesions noted today.  Nails are hypertrophic, dystrophic and brittle.  There is mild yellow discoloration of the nails.  There is incurvation present to the hallux toenails left side worse than the right.  She gets tenderness nails 1-5 bilaterally except the left 2nd toe which has been amputated.  There is no drainage or pus or signs of infection today. There is new preulcerative area noted along the right medial second toe without any drainage or pus.  There is no edema, erythema or signs of infection. Digital contractures present. Does not. No pain with calf compression, swelling, warmth, erythema  Assessment: Symptomatic onychomycosis, history of amputation  Plan: Symptomatic onychomycosis - Sharply debrided the nails x 9 without any complications or bleeding.    Preulcerative lesion, bunion -I showed her the preulcerative area on the right second toe.  Dispensed toe spacers.  Monitoring signs or symptoms of skin breakdown or infection. -Continue gabapentin  for neuropathy.  [name] DPM

## 2024-03-17 ENCOUNTER — Ambulatory Visit: Admitting: Podiatry
